# Patient Record
Sex: Male | Born: 1955 | Race: White | Hispanic: No | Marital: Married | Smoking: Current every day smoker
Health system: Southern US, Community
[De-identification: ages and names within clinical notes are randomized; demographics above are authoritative.]

## PROBLEM LIST (undated history)

## (undated) DIAGNOSIS — F32A Depression, unspecified: Secondary | ICD-10-CM

## (undated) DIAGNOSIS — F4310 Post-traumatic stress disorder, unspecified: Secondary | ICD-10-CM

## (undated) DIAGNOSIS — R0600 Dyspnea, unspecified: Secondary | ICD-10-CM

## (undated) DIAGNOSIS — F329 Major depressive disorder, single episode, unspecified: Secondary | ICD-10-CM

## (undated) DIAGNOSIS — D649 Anemia, unspecified: Secondary | ICD-10-CM

## (undated) DIAGNOSIS — F411 Generalized anxiety disorder: Secondary | ICD-10-CM

## (undated) DIAGNOSIS — C4490 Unspecified malignant neoplasm of skin, unspecified: Secondary | ICD-10-CM

## (undated) DIAGNOSIS — M1990 Unspecified osteoarthritis, unspecified site: Secondary | ICD-10-CM

## (undated) DIAGNOSIS — I10 Essential (primary) hypertension: Secondary | ICD-10-CM

## (undated) DIAGNOSIS — F319 Bipolar disorder, unspecified: Secondary | ICD-10-CM

## (undated) DIAGNOSIS — F988 Other specified behavioral and emotional disorders with onset usually occurring in childhood and adolescence: Secondary | ICD-10-CM

## (undated) DIAGNOSIS — Z87442 Personal history of urinary calculi: Secondary | ICD-10-CM

## (undated) HISTORY — PX: BACK SURGERY: SHX140

## (undated) HISTORY — PX: LITHOTRIPSY: SUR834

## (undated) HISTORY — DX: Other specified behavioral and emotional disorders with onset usually occurring in childhood and adolescence: F98.8

## (undated) HISTORY — PX: NOSE SURGERY: SHX723

## (undated) HISTORY — DX: Essential (primary) hypertension: I10

---

## 1999-03-26 ENCOUNTER — Emergency Department (HOSPITAL_COMMUNITY): Admission: EM | Admit: 1999-03-26 | Discharge: 1999-03-26 | Payer: Self-pay | Admitting: Emergency Medicine

## 1999-03-26 ENCOUNTER — Encounter: Payer: Self-pay | Admitting: Emergency Medicine

## 1999-05-10 ENCOUNTER — Emergency Department (HOSPITAL_COMMUNITY): Admission: EM | Admit: 1999-05-10 | Discharge: 1999-05-10 | Payer: Self-pay | Admitting: Emergency Medicine

## 1999-05-14 ENCOUNTER — Encounter: Payer: Self-pay | Admitting: Emergency Medicine

## 1999-05-14 ENCOUNTER — Emergency Department (HOSPITAL_COMMUNITY): Admission: EM | Admit: 1999-05-14 | Discharge: 1999-05-14 | Payer: Self-pay | Admitting: Emergency Medicine

## 2001-09-30 ENCOUNTER — Ambulatory Visit (HOSPITAL_BASED_OUTPATIENT_CLINIC_OR_DEPARTMENT_OTHER): Admission: RE | Admit: 2001-09-30 | Discharge: 2001-09-30 | Payer: Self-pay | Admitting: Urology

## 2002-01-19 ENCOUNTER — Ambulatory Visit (HOSPITAL_BASED_OUTPATIENT_CLINIC_OR_DEPARTMENT_OTHER): Admission: RE | Admit: 2002-01-19 | Discharge: 2002-01-19 | Payer: Self-pay | Admitting: Plastic Surgery

## 2002-01-19 ENCOUNTER — Encounter (INDEPENDENT_AMBULATORY_CARE_PROVIDER_SITE_OTHER): Payer: Self-pay | Admitting: Specialist

## 2002-11-18 ENCOUNTER — Ambulatory Visit (HOSPITAL_COMMUNITY): Admission: RE | Admit: 2002-11-18 | Discharge: 2002-11-18 | Payer: Self-pay | Admitting: Neurosurgery

## 2002-11-18 ENCOUNTER — Encounter: Payer: Self-pay | Admitting: Neurosurgery

## 2003-01-09 ENCOUNTER — Emergency Department (HOSPITAL_COMMUNITY): Admission: EM | Admit: 2003-01-09 | Discharge: 2003-01-09 | Payer: Self-pay | Admitting: Emergency Medicine

## 2007-06-16 ENCOUNTER — Encounter: Admission: RE | Admit: 2007-06-16 | Discharge: 2007-06-16 | Payer: Self-pay | Admitting: Orthopedic Surgery

## 2008-09-28 ENCOUNTER — Ambulatory Visit: Payer: Self-pay | Admitting: Diagnostic Radiology

## 2008-09-28 ENCOUNTER — Emergency Department (HOSPITAL_BASED_OUTPATIENT_CLINIC_OR_DEPARTMENT_OTHER): Admission: EM | Admit: 2008-09-28 | Discharge: 2008-09-28 | Payer: Self-pay | Admitting: Emergency Medicine

## 2008-10-19 ENCOUNTER — Emergency Department (HOSPITAL_BASED_OUTPATIENT_CLINIC_OR_DEPARTMENT_OTHER): Admission: EM | Admit: 2008-10-19 | Discharge: 2008-10-20 | Payer: Self-pay | Admitting: Emergency Medicine

## 2008-10-19 ENCOUNTER — Ambulatory Visit: Payer: Self-pay | Admitting: Interventional Radiology

## 2009-03-25 ENCOUNTER — Emergency Department (HOSPITAL_BASED_OUTPATIENT_CLINIC_OR_DEPARTMENT_OTHER): Admission: EM | Admit: 2009-03-25 | Discharge: 2009-03-25 | Payer: Self-pay | Admitting: Emergency Medicine

## 2009-04-07 ENCOUNTER — Ambulatory Visit: Payer: Self-pay | Admitting: Diagnostic Radiology

## 2009-04-07 ENCOUNTER — Emergency Department (HOSPITAL_BASED_OUTPATIENT_CLINIC_OR_DEPARTMENT_OTHER): Admission: EM | Admit: 2009-04-07 | Discharge: 2009-04-07 | Payer: Self-pay | Admitting: Emergency Medicine

## 2009-04-14 ENCOUNTER — Emergency Department (HOSPITAL_BASED_OUTPATIENT_CLINIC_OR_DEPARTMENT_OTHER): Admission: EM | Admit: 2009-04-14 | Discharge: 2009-04-14 | Payer: Self-pay | Admitting: Emergency Medicine

## 2009-04-19 ENCOUNTER — Encounter: Admission: RE | Admit: 2009-04-19 | Discharge: 2009-04-19 | Payer: Self-pay | Admitting: Orthopedic Surgery

## 2010-12-22 LAB — DIFFERENTIAL
Basophils Relative: 1 % (ref 0–1)
Eosinophils Absolute: 0.1 10*3/uL (ref 0.0–0.7)
Eosinophils Absolute: 0.2 10*3/uL (ref 0.0–0.7)
Eosinophils Relative: 1 % (ref 0–5)
Eosinophils Relative: 2 % (ref 0–5)
Lymphs Abs: 3.2 10*3/uL (ref 0.7–4.0)
Lymphs Abs: 4.1 10*3/uL — ABNORMAL HIGH (ref 0.7–4.0)
Monocytes Absolute: 0.9 10*3/uL (ref 0.1–1.0)
Monocytes Relative: 11 % (ref 3–12)
Neutrophils Relative %: 37 % — ABNORMAL LOW (ref 43–77)

## 2010-12-22 LAB — COMPREHENSIVE METABOLIC PANEL
AST: 57 U/L — ABNORMAL HIGH (ref 0–37)
Albumin: 4.8 g/dL (ref 3.5–5.2)
Alkaline Phosphatase: 86 U/L (ref 39–117)
BUN: 22 mg/dL (ref 6–23)
CO2: 26 mEq/L (ref 19–32)
Chloride: 104 mEq/L (ref 96–112)
GFR calc non Af Amer: >60 mL/min (ref >60)
Potassium: 5.1 mEq/L (ref 3.5–5.1)
Total Bilirubin: 1.2 mg/dL (ref 0.3–1.2)

## 2010-12-22 LAB — URINALYSIS, ROUTINE W REFLEX MICROSCOPIC
Bilirubin Urine: NEGATIVE
Bilirubin Urine: NEGATIVE
Glucose, UA: NEGATIVE mg/dL
Glucose, UA: NEGATIVE mg/dL
Hgb urine dipstick: NEGATIVE
Hgb urine dipstick: NEGATIVE
Hgb urine dipstick: NEGATIVE
Ketones, ur: NEGATIVE mg/dL
Ketones, ur: NEGATIVE mg/dL
Nitrite: NEGATIVE
Protein, ur: NEGATIVE mg/dL
Protein, ur: NEGATIVE mg/dL
Protein, ur: NEGATIVE mg/dL
Urobilinogen, UA: 0.2 mg/dL (ref 0.0–1.0)
Urobilinogen, UA: 1 mg/dL (ref 0.0–1.0)

## 2010-12-22 LAB — CBC
HCT: 38.3 % — ABNORMAL LOW (ref 39.0–52.0)
HCT: 38.4 % — ABNORMAL LOW (ref 39.0–52.0)
Hemoglobin: 13.1 g/dL (ref 13.0–17.0)
MCHC: 35.5 g/dL (ref 30.0–36.0)
MCV: 86.1 fL (ref 78.0–100.0)
MCV: 86.6 fL (ref 78.0–100.0)
Platelets: 55 10*3/uL — ABNORMAL LOW (ref 150–400)
RBC: 4.44 MIL/uL (ref 4.22–5.81)
RDW: 13 % (ref 11.5–15.5)
WBC: 7.8 10*3/uL (ref 4.0–10.5)

## 2010-12-22 LAB — URINE CULTURE

## 2010-12-22 LAB — BASIC METABOLIC PANEL
Chloride: 101 mEq/L (ref 96–112)
GFR calc Af Amer: >60 mL/min (ref >60)
Potassium: 4.2 mEq/L (ref 3.5–5.1)

## 2010-12-30 LAB — DIFFERENTIAL
Basophils Relative: 1 % (ref 0–1)
Eosinophils Absolute: 0.1 10*3/uL (ref 0.0–0.7)
Eosinophils Relative: 1 % (ref 0–5)
Monocytes Absolute: 0.9 10*3/uL (ref 0.1–1.0)
Monocytes Relative: 10 % (ref 3–12)
Neutrophils Relative %: 51 % (ref 43–77)

## 2010-12-30 LAB — BASIC METABOLIC PANEL
CO2: 27 mEq/L (ref 19–32)
Chloride: 103 mEq/L (ref 96–112)
Creatinine, Ser: 0.8 mg/dL (ref 0.4–1.5)
GFR calc Af Amer: >60 mL/min (ref >60)

## 2010-12-30 LAB — POCT CARDIAC MARKERS
CKMB, poc: <1 ng/mL — ABNORMAL LOW (ref 1.0–8.0)
Myoglobin, poc: 33.4 ng/mL (ref 12–200)
Troponin i, poc: <0.05 ng/mL (ref 0.00–0.09)

## 2010-12-30 LAB — CBC
Hemoglobin: 12.4 g/dL — ABNORMAL LOW (ref 13.0–17.0)
MCHC: 33.8 g/dL (ref 30.0–36.0)
MCV: 86.6 fL (ref 78.0–100.0)
RBC: 4.23 MIL/uL (ref 4.22–5.81)

## 2010-12-31 LAB — DIFFERENTIAL
Basophils Absolute: 0 10*3/uL (ref 0.0–0.1)
Basophils Relative: 1 % (ref 0–1)
Eosinophils Relative: 2 % (ref 0–5)
Lymphocytes Relative: 39 % (ref 12–46)
Monocytes Absolute: 0.7 10*3/uL (ref 0.1–1.0)
Monocytes Relative: 8 % (ref 3–12)

## 2010-12-31 LAB — COMPREHENSIVE METABOLIC PANEL
AST: 24 U/L (ref 0–37)
Albumin: 4.4 g/dL (ref 3.5–5.2)
Alkaline Phosphatase: 99 U/L (ref 39–117)
Chloride: 100 mEq/L (ref 96–112)
GFR calc Af Amer: >60 mL/min (ref >60)
Potassium: 4.7 mEq/L (ref 3.5–5.1)
Total Bilirubin: 0.4 mg/dL (ref 0.3–1.2)
Total Protein: 7.6 g/dL (ref 6.0–8.3)

## 2010-12-31 LAB — POCT CARDIAC MARKERS
CKMB, poc: <1 ng/mL — ABNORMAL LOW (ref 1.0–8.0)
Myoglobin, poc: 37.1 ng/mL (ref 12–200)
Troponin i, poc: <0.05 ng/mL (ref 0.00–0.09)

## 2010-12-31 LAB — URINALYSIS, ROUTINE W REFLEX MICROSCOPIC
Glucose, UA: NEGATIVE mg/dL
Hgb urine dipstick: NEGATIVE
Protein, ur: NEGATIVE mg/dL
Specific Gravity, Urine: 1.005 (ref 1.005–1.030)

## 2010-12-31 LAB — CBC
Platelets: 295 10*3/uL (ref 150–400)
WBC: 8.7 10*3/uL (ref 4.0–10.5)

## 2011-01-31 NOTE — H&P (Signed)
   NAME:  David [name], David [name]                          ACCOUNT NO.:  [id number]   MEDICAL RECORD NO.:  [id number]                   PATIENT TYPE:  OIB   LOCATION:  3013                                 FACILITY:  [facility]   PHYSICIAN:  Hilda Lias, M.D.                DATE OF BIRTH:  [birth year]   DATE OF ADMISSION:  11/18/2002  DATE OF DISCHARGE:  11/18/2002                                HISTORY & PHYSICAL   CLINICAL HISTORY:  David [name] is a gentleman who was seen by me 10 days ago in  my office because of back pain with radiation down to the hip and then  posterolateral to the left foot for almost four weeks.  The patient is  getting worse.  He has a burning sensation.  He cannot sleep, and he says  that he is getting weaker.  He had an MRI for further evaluation.  Because  of findings, he wanted to proceed with surgery.   PAST MEDICAL HISTORY:  Kidney stone in 2003.   SOCIAL HISTORY:  The patient smokes.  He does not drink.   FAMILY HISTORY:  Unremarkable.   REVIEW OF SYSTEMS:  Positive only for back pain and left leg pain.   MEDICATIONS:  Prilosec.   PHYSICAL EXAMINATION:  GENERAL:  The patient came to my office, limping from  the left leg.  He had difficulty sitting.  HEENT:  Normal.  NECK:  Normal.  LUNGS:  Clear.  HEART:  Heart sounds normal.  ABDOMEN:  Normal.  EXTREMITIES:  Normal pulses.  NEUROLOGIC:  Mental status normal.  Cranial nerves normal.  Strength:  He  has a 2/5 weakness, plantar flexion of the left foot, and 2/5 dorsiflexion.  Pressure with ice on the left ankle.   The MRI showed indeed he has a large herniated disk at L5-S1 with _________.  There is displacement on the thecal sac.   DIAGNOSIS:  Left L5-S1 herniated disk with a fragment.   RECOMMENDATIONS:  The patient is being admitted.  Will proceed with an L5-S1  diskectomy.  Decompression of the S1 nerve root, as well as the thecal sac.  He knows all the risks such as infection, CSF leak, worsening  pain,  paralysis, need for further surgery.                                               Hilda Lias, M.D.    EB/MEDQ  D:  11/18/2002  T:  11/18/2002  Job:  [id number]

## 2011-01-31 NOTE — Op Note (Signed)
   NAME:  David [name], David [name]                          ACCOUNT NO.:  [id number]   MEDICAL RECORD NO.:  [id number]                   PATIENT TYPE:  OIB   LOCATION:  3013                                 FACILITY:  [facility]   PHYSICIAN:  Hilda Lias, M.D.                DATE OF BIRTH:  [birth year]   DATE OF PROCEDURE:  11/18/2002  DATE OF DISCHARGE:  11/18/2002                                 OPERATIVE REPORT   PREOPERATIVE DIAGNOSIS:  Left L5-S1 herniated disk.   POSTOPERATIVE DIAGNOSIS:  Left L5-S1 herniated disk.   PROCEDURE:  Left L5-S1 removal of four fragments of disk, foraminotomy,  microscope, C-arm, [employer].   SURGEON:  Hilda Lias, M.D.   ASSISTANT:  Stefani Dama, M.D.   CLINICAL HISTORY:  The patient was admitted because of back and left leg  pain.  Because of the pain and the weakness, the patient wanted to proceed  with surgery.  The risks were explained in the history and physical.   DESCRIPTION OF PROCEDURE:  The patient was taken to the OR, and he was  positioned in a prone manner.  The back was prepped with Betadine.  With the  C-arm we localized the 5-1 space on the left side.  We made an inch incision  away from the midline and with dilator, we went straight down to the L5-S1  interspace.  We brought the microscope into the area.  With the drill we  drilled the lower lamina of L5, the upper of S1.  The yellow ligament was  removed.  Indeed, we found that the S1 nerve root was displaced laterally  and the thecal sac medially.  Right at the level of the axilla there was a  large disk.  Microdissection was carried out and four large pieces of disk  were removed.  We investigated above and below, and it was essentially  negative.  The patient has an osteophyte in the midline.  With the hooks, we  investigate superior, medial,  and inferior, and there was no more fragment.  The disk was completely closed, and we decided not to enter the disk space.  Foraminotomy was  accomplished.  Then the area was irrigated and fentanyl and  Depo-Medrol were left within the epidural space, and the wound was closed  with Vicryl and a Steri-Strip.                                               Hilda Lias, M.D.    EB/MEDQ  D:  11/18/2002  T:  11/19/2002  Job:  [id number]

## 2012-01-12 DIAGNOSIS — F329 Major depressive disorder, single episode, unspecified: Secondary | ICD-10-CM | POA: Insufficient documentation

## 2012-01-12 DIAGNOSIS — L02219 Cutaneous abscess of trunk, unspecified: Secondary | ICD-10-CM | POA: Insufficient documentation

## 2012-01-12 DIAGNOSIS — L03319 Cellulitis of trunk, unspecified: Secondary | ICD-10-CM | POA: Insufficient documentation

## 2012-01-12 DIAGNOSIS — Z79899 Other long term (current) drug therapy: Secondary | ICD-10-CM | POA: Insufficient documentation

## 2012-01-12 DIAGNOSIS — F17200 Nicotine dependence, unspecified, uncomplicated: Secondary | ICD-10-CM | POA: Insufficient documentation

## 2012-01-12 DIAGNOSIS — F3289 Other specified depressive episodes: Secondary | ICD-10-CM | POA: Insufficient documentation

## 2012-01-12 NOTE — ED Notes (Signed)
C/o red, raised area to right side of abdomen

## 2012-01-13 ENCOUNTER — Encounter (HOSPITAL_BASED_OUTPATIENT_CLINIC_OR_DEPARTMENT_OTHER): Payer: Self-pay | Admitting: *Deleted

## 2012-01-13 ENCOUNTER — Emergency Department (HOSPITAL_BASED_OUTPATIENT_CLINIC_OR_DEPARTMENT_OTHER)
Admission: EM | Admit: 2012-01-13 | Discharge: 2012-01-13 | Disposition: A | Discharge status: Home / Self Care | Payer: Medicare Other | Attending: Emergency Medicine | Admitting: Emergency Medicine

## 2012-01-13 DIAGNOSIS — L0291 Cutaneous abscess, unspecified: Secondary | ICD-10-CM

## 2012-01-13 HISTORY — DX: Major depressive disorder, single episode, unspecified: F32.9

## 2012-01-13 HISTORY — DX: Depression, unspecified: F32.A

## 2012-01-13 MED ORDER — OXYCODONE-ACETAMINOPHEN 5-325 MG PO TABS: 1.0000 | ORAL_TABLET | ORAL | Status: AC | PRN

## 2012-01-13 MED ORDER — SULFAMETHOXAZOLE-TRIMETHOPRIM 800-160 MG PO TABS: 1.0000 | ORAL_TABLET | Freq: Two times a day (BID) | ORAL | Status: AC

## 2012-01-13 MED ORDER — OXYCODONE-ACETAMINOPHEN 5-325 MG PO TABS
2.0000 | ORAL_TABLET | Freq: Once | ORAL | Status: AC
  Administered 2012-01-13: 2 via ORAL
  Filled 2012-01-13: qty 2

## 2012-01-13 NOTE — ED Provider Notes (Signed)
History   This chart was scribed for Joya Gaskins, MD by Melba Coon. The patient was seen in room MH06/MH06 and the patient's care was started at 12:00AM.    CSN: [id number]  Arrival date & time 01/12/12  2353   First MD Initiated Contact with Patient 01/12/12 2355      Chief Complaint  Patient presents with  . Abscess     HPI David [name] is a 56 y.o. male who presents to the Emergency Department complaining of a persistent, moderate abscess with an onset tonight. Area is indurated, red, and raised. Pain with palpation. No meds have been taken at home. Nothing alleviates or aggravates pain. No HA, fever, neck pain,chest pain, SOB, abd pain,vomiting/diarrhea, or extremity pain, edema, weakness, numbness, or tingling. No known allergies. No other pertinent medical symptoms.   Past Medical History  Diagnosis Date  . Depression     History reviewed. No pertinent past surgical history.  History reviewed. No pertinent family history.  History  Substance Use Topics  . Smoking status: Current Everyday Smoker  . Smokeless tobacco: Not on file  . Alcohol Use: Yes      Review of Systems 10 Systems reviewed and all are negative for acute change except as noted in the HPI.   Allergies  Review of patient's allergies indicates no known allergies.  Home Medications   Current Outpatient Rx  Name Route Sig Dispense Refill  . AMPHETAMINE-DEXTROAMPHETAMINE 10 MG PO TABS Oral Take 10 mg by mouth daily.    Marland Kitchen FLUOXETINE HCL 10 MG PO CAPS Oral Take 10 mg by mouth daily.    Marland Kitchen PREGABALIN 50 MG PO CAPS Oral Take 50 mg by mouth 3 (three) times daily.      BP 156/93  Pulse 89  Temp(Src) 97.9 F (36.6 C) (Oral)  Resp 18  SpO2 98%  Physical Exam CONSTITUTIONAL: Well developed/well nourished HEAD AND FACE: Normocephalic/atraumatic EYES: EOMI/PERRL ENMT: Mucous membranes moist NECK: supple no meningeal signs SPINE:entire spine nontender CV: S1/S2 noted, no  murmurs/rubs/gallops noted LUNGS: Lungs are clear to auscultation bilaterally, no apparent distress ABDOMEN: Soft, nontender, no rebound or guarding GU:no cva tenderness NEURO: Pt is awake/alert, moves all extremitiesx4 EXTREMITIES: pulses normal, full ROM SKIN: Nickel sized abscess to RLQ of abd; no drainage or bleeding, no crepitance, no fluctuance PSYCH: no abnormalities of mood noted  ED Course  Procedures   DIAGNOSTIC STUDIES: Oxygen Saturation is 98% on room air, normal by my interpretation.    COORDINATION OF CARE:  12:05AM - likely early abscess but not amenable to drainage at this time, discussed need for warm compresses to abscess, bactrim and return if worsens or becomes more fluctuant  The patient appears reasonably screened and/or stabilized for discharge and I doubt any other medical condition or other [facility] requiring further screening, evaluation, or treatment in the ED at this time prior to discharge.     MDM  Nursing notes reviewed and considered in documentation   I personally performed the services described in this documentation, which was scribed in my presence. The recorded information has been reviewed and considered.         Joya Gaskins, MD 01/13/12 [phone]

## 2012-01-13 NOTE — Discharge Instructions (Signed)
Abscess An abscess (boil or furuncle) is an infected area under your skin. This area is filled with yellowish white fluid (pus). HOME CARE   Only take medicine as told by your doctor.   Keep the skin clean around your abscess. Keep clothes that may touch the abscess clean.   Change any bandages (dressings) as told by your doctor.   Avoid direct skin contact with other people. The infection can spread by skin contact with others.   Practice good hygiene and do not share personal care items.   Do not share athletic equipment, towels, or whirlpools. Shower after every practice or work out session.   If a draining area cannot be covered:   Do not play sports.   Children should not go to daycare until the wound has healed or until fluid (drainage) stops coming out of the wound.   See your doctor for a follow-up visit as told.  GET HELP RIGHT AWAY IF:   There is more pain, puffiness (swelling), and redness in the wound site.   There is fluid or bleeding from the wound site.   You have muscle aches, chills, fever, or feel sick.   You or your child has a temperature by mouth above 102 F (38.9 C), not controlled by medicine.   Your baby is older than 3 months with a rectal temperature of 102 F (38.9 C) or higher.  MAKE SURE YOU:   Understand these instructions.   Will watch your condition.   Will get help right away if you are not doing well or get worse.  Document Released: 02/18/2008 Document Revised: 08/21/2011 Document Reviewed: 02/18/2008 [employer] Patient Information 2012 [employer]. 

## 2012-01-13 NOTE — ED Notes (Signed)
MD at bedside. 

## 2012-01-21 ENCOUNTER — Emergency Department
Admission: EM | Admit: 2012-01-21 | Discharge: 2012-01-21 | Disposition: A | Discharge status: Home / Self Care | Payer: Medicare Other | Source: Home / Self Care | Attending: Emergency Medicine | Admitting: Emergency Medicine

## 2012-01-21 ENCOUNTER — Encounter: Payer: Self-pay | Admitting: *Deleted

## 2012-01-21 DIAGNOSIS — L089 Local infection of the skin and subcutaneous tissue, unspecified: Secondary | ICD-10-CM

## 2012-01-21 DIAGNOSIS — L03319 Cellulitis of trunk, unspecified: Secondary | ICD-10-CM

## 2012-01-21 DIAGNOSIS — L02219 Cutaneous abscess of trunk, unspecified: Secondary | ICD-10-CM

## 2012-01-21 MED ORDER — TRAMADOL HCL 50 MG PO TABS: 50.0000 mg | ORAL_TABLET | Freq: Four times a day (QID) | ORAL | Status: AC | PRN

## 2012-01-21 MED ORDER — DOXYCYCLINE HYCLATE 100 MG PO CAPS: 100.0000 mg | ORAL_CAPSULE | Freq: Two times a day (BID) | ORAL | Status: AC

## 2012-01-21 MED ORDER — CEFTRIAXONE SODIUM 250 MG IJ SOLR
500.0000 mg | Freq: Once | INTRAMUSCULAR | Status: AC
  Administered 2012-01-21: 500 mg via INTRAMUSCULAR

## 2012-01-21 NOTE — ED Provider Notes (Signed)
History   This chart was scribed for David Gaskins, MD by David [name]. The patient was seen in room KUCP/KUCP and the patient's care was started at 12:00AM.    CSN: [id number]  Arrival date & time 01/21/12  1548   First MD Initiated Contact with Patient 01/12/12 2355      Chief Complaint  Patient presents with  . Wound Infection    right abdomen     HPI  LAST VISIT TO ER: David [name] is a 56 y.o. male who presents to the Emergency Department complaining of a persistent, moderate abscess with an onset tonight. Area is indurated, red, and raised. Pain with palpation. No meds have been taken at home. Nothing alleviates or aggravates pain. No HA, fever, neck pain,chest pain, SOB, abd pain,vomiting/diarrhea, or extremity pain, edema, weakness, numbness, or tingling. No known allergies. No other pertinent medical symptoms.  THIS VISIT: The patient states the wound has opened up and has been draining some purulent material.  He took all of the antibiotic.  He is here to check on her and make sure that it is getting better.  He feels that the redness and swelling and tenderness has decreased.  But he still concerned that he may have MRSA.  No fever, chills, nausea, vomiting.   Past Medical History  Diagnosis Date  . Depression     History reviewed. No pertinent past surgical history.  History reviewed. No pertinent family history.  History  Substance Use Topics  . Smoking status: Current Everyday Smoker  . Smokeless tobacco: Not on file  . Alcohol Use: Yes      Review of Systems  All other systems reviewed and are negative.   10 Systems reviewed and all are negative for acute change except as noted in the HPI.   Allergies  Codeine  Home Medications   Current Outpatient Rx  Name Route Sig Dispense Refill  . AMPHETAMINE-DEXTROAMPHETAMINE 10 MG PO TABS Oral Take 10 mg by mouth daily.    Marland Kitchen DOXYCYCLINE HYCLATE 100 MG PO CAPS Oral Take 1 capsule (100 mg total) by  mouth 2 (two) times daily. 20 capsule 0  . FLUOXETINE HCL 10 MG PO CAPS Oral Take 10 mg by mouth daily.    . OXYCODONE-ACETAMINOPHEN 5-325 MG PO TABS Oral Take 1 tablet by mouth every 4 (four) hours as needed for pain. 15 tablet 0  . PREGABALIN 50 MG PO CAPS Oral Take 50 mg by mouth 3 (three) times daily.    . SULFAMETHOXAZOLE-TRIMETHOPRIM 800-160 MG PO TABS Oral Take 1 tablet by mouth every 12 (twelve) hours. 10 tablet 0    BP 160/103  Pulse 114  Temp(Src) 97.8 F (36.6 C) (Oral)  Resp 14  Ht 5\' 8"  (1.727 m)  Wt 190 lb (86.183 kg)  BMI 28.89 kg/m2  SpO2 97%  Physical Exam  Nursing note and vitals reviewed. Constitutional: He is oriented to person, place, and time. He appears well-developed and well-nourished.  HENT:  Head: Normocephalic and atraumatic.  Eyes: No scleral icterus.  Neck: Neck supple.  Cardiovascular: Regular rhythm and normal heart sounds.   Pulmonary/Chest: Effort normal and breath sounds normal. No respiratory distress.  Abdominal:    Neurological: He is alert and oriented to person, place, and time.  Skin: Skin is warm and dry.       On the right side of his abdomen there is an abscess approximately 2 cm in diameter with induration but no fluctuance.  The central area  has some ulceration but does have some granulation tissue in the middle.  There is minimal tenderness to palpation.  I cannot express any drainage or blood.  There is no surrounding erythema or signs of cellulitis or panniculitis.  Psychiatric: He has a normal mood and affect. His speech is normal.     ED Course  Procedures         MDM   He has an abscess on his abdomen.  This is likely MRSA abscess.  Since it has opened up we took culture of it.  He has completed his course of Bactrim x5 days and I will extend that with doxycycline for 10 days.  We also gave him a shot of Rocephin here in clinic.   even though I do not see this when previously, I believe per his statements that it is  improving.  I do not feel that an I&D is necessary at this time. however if it is in the future, then a general surgeon may be appropriate because of the location.  I advised him to keep it clean and dry.  He is to establish with PCP in the area and followup.   Wound precautions are given to him.  Return to clinic if any worsening symptoms.  I advised him that it may take an entire for 6 weeks for good resolution to occur but it should not be getting worse.  David Hind, MD 01/21/12 [phone]

## 2012-01-21 NOTE — ED Notes (Signed)
Patient c/o abscess on right lower abdomen. He was seen 8 days ago @ [facility], given septra  which he finished. He reports the abscess was not lanced or cultured. He reports the wound is not worse but "different". Small amount of white pus present on dressing.

## 2012-01-24 LAB — WOUND CULTURE

## 2012-01-25 ENCOUNTER — Telehealth: Payer: Self-pay

## 2012-01-25 NOTE — ED Notes (Signed)
Left a message on voice mail asking how patient is feeling and advising to call back with any questions or concerns.  

## 2012-02-17 ENCOUNTER — Encounter: Payer: Self-pay | Admitting: *Deleted

## 2012-04-24 ENCOUNTER — Emergency Department
Admission: EM | Admit: 2012-04-24 | Discharge: 2012-04-24 | Disposition: A | Discharge status: Home / Self Care | Payer: Medicare Other | Source: Home / Self Care | Attending: Emergency Medicine | Admitting: Emergency Medicine

## 2012-04-24 DIAGNOSIS — L255 Unspecified contact dermatitis due to plants, except food: Secondary | ICD-10-CM

## 2012-04-24 MED ORDER — HYDROXYZINE HCL 25 MG PO TABS: 25.0000 mg | ORAL_TABLET | Freq: Three times a day (TID) | ORAL | Status: AC | PRN

## 2012-04-24 MED ORDER — METHYLPREDNISOLONE ACETATE 80 MG/ML IJ SUSP
80.0000 mg | Freq: Once | INTRAMUSCULAR | Status: AC
  Administered 2012-04-24: 80 mg via INTRAMUSCULAR

## 2012-04-24 MED ORDER — PREDNISONE (PAK) 10 MG PO TABS: ORAL_TABLET | ORAL | Status: AC

## 2012-04-24 NOTE — ED Provider Notes (Addendum)
History     CSN: [id number]  Arrival date & time 04/24/12  1513   First MD Initiated Contact with Patient 04/24/12 1517      Chief Complaint  Patient presents with  . Rash    HPI About 2 or 3 weeks ago, was exposed to plants in the [place] such as poison ivy or poison oak. For the past 2-1/2 weeks, complains of severely pruritic rash on extremities neck chest. It's now worsening in the itch keeps him up at night. Has tried OTC meds without relief. He denies any associated chest pain, dyspnea, fever, chills, nausea, vomiting. He denies any tick bite. Past Medical History  Diagnosis Date  . Depression   . ADD (attention deficit disorder)   . HTN (hypertension)     History reviewed. No pertinent past surgical history.  Family History  Problem Relation Age of Onset  . Arthritis    . Heart disease    . Cancer    . Hypertension      History  Substance Use Topics  . Smoking status: Current Everyday Smoker  . Smokeless tobacco: Not on file  . Alcohol Use: Yes      Review of Systems  All other systems reviewed and are negative.    Allergies  Codeine  Home Medications   Current Outpatient Rx  Name Route Sig Dispense Refill  . AMPHETAMINE-DEXTROAMPHETAMINE 10 MG PO TABS Oral Take 10 mg by mouth daily.    Marland Kitchen HYDROXYZINE HCL 25 MG PO TABS Oral Take 1 tablet (25 mg total) by mouth every 8 (eight) hours as needed for itching. May cause drowsiness. 20 tablet 0  . PREDNISONE (PAK) 10 MG PO TABS  Take as directed for 6 days. 21 tablet 0    BP 148/76  Pulse 86  Temp 98.1 F (36.7 C) (Oral)  Resp 18  Ht 5\' 8"  (1.727 m)  Wt 199 lb (90.266 kg)  BMI 30.26 kg/m2  SpO2 94%  Physical Exam  Nursing note and vitals reviewed. Constitutional: He is oriented to person, place, and time. He appears well-developed and well-nourished. No distress.  HENT:  Head: Normocephalic and atraumatic.  Eyes: Conjunctivae and EOM are normal. Pupils are equal, round, and reactive to light.  No scleral icterus.  Neck: Normal range of motion.  Cardiovascular: Normal rate.   Pulmonary/Chest: Effort normal.  Abdominal: He exhibits no distension.  Musculoskeletal: Normal range of motion.  Neurological: He is alert and oriented to person, place, and time.  Skin: Skin is warm.  Psychiatric: He has a normal mood and affect. His speech is normal. Cognition and memory are normal.       He is mildly anxious, but he is cooperative and pleasant.   Skin : Diffuse macular papular eruption, erythematous, in streaks in clusters bilaterally upper extremities and lower extremities and diffusely on trunk. Also, few patches on neck and face. Not affecting the eyes.  No lip swelling or wheezing.  ED Course  Procedures (including critical care time)  Labs Reviewed - No data to display No results found.   1. Dermatitis due to plants, including poison ivy, sumac, and oak       MDM  Severe contact dermatitis. After risks, benefits, alternatives discussed, he agrees with the following treatment: Depo-Medrol 80 mg IM stat. Prednisone 10 mg-6 day Dosepak. Hydroxyzine 25 mg by mouth when necessary itch. Precautions discussed. Other nonpharmacologic measures for skin care discussed. Recheck if not improved in one week, sooner if worse.  Also  advised him that systolic BP is mildly elevated today, and I advised him to followup with his PCP to have BP rechecked within 2 weeks. I also urged him to followup with his physician for all routine care. He notes that he was on Prozac empiric in the past, but he is no longer taking these, and I advised him to followup with his physician.  (He denies any depression, dysphoria, or any suicidal or homicidal ideation.)       Lajean Manes, MD 04/24/12 1715   Lajean Manes, MD 04/24/12 [phone]

## 2012-04-24 NOTE — ED Notes (Signed)
Rash to BL arms started on month ago now noted to left side of chest

## 2012-05-11 ENCOUNTER — Emergency Department
Admission: EM | Admit: 2012-05-11 | Discharge: 2012-05-11 | Disposition: A | Discharge status: Home / Self Care | Payer: Medicare Other | Source: Home / Self Care | Attending: Family Medicine | Admitting: Family Medicine

## 2012-05-11 ENCOUNTER — Encounter: Payer: Self-pay | Admitting: *Deleted

## 2012-05-11 DIAGNOSIS — L309 Dermatitis, unspecified: Secondary | ICD-10-CM

## 2012-05-11 DIAGNOSIS — L259 Unspecified contact dermatitis, unspecified cause: Secondary | ICD-10-CM

## 2012-05-11 MED ORDER — PREDNISONE 10 MG PO TABS: ORAL_TABLET | ORAL | Status: DC

## 2012-05-11 MED ORDER — SULFAMETHOXAZOLE-TRIMETHOPRIM 800-160 MG PO TABS: 1.0000 | ORAL_TABLET | Freq: Two times a day (BID) | ORAL | Status: AC

## 2012-05-11 MED ORDER — TRIAMCINOLONE ACETONIDE 40 MG/ML IJ SUSP
40.0000 mg | Freq: Once | INTRAMUSCULAR | Status: AC
  Administered 2012-05-11: 40 mg via INTRAMUSCULAR

## 2012-05-11 NOTE — ED Notes (Signed)
Patient was treat for poison ivy on 04/24/12 and given/finished prednisone. Rash has since spread to his back.

## 2012-05-11 NOTE — ED Provider Notes (Signed)
History     CSN: [id number]  Arrival date & time 05/11/12  1343   First MD Initiated Contact with Patient 05/11/12 1410      Chief Complaint  Patient presents with  . Rash      HPI Comments: Patient was seen approximately 17 days ago with a contact dermatitis on arms.  He states that he improved and rash almost completely resolved, but now he is developing recurrent itching and redness on arms.  He also has a new eruption on his back over the past several days.  There has been no drainage from lesions.  No fevers, chills, and sweats.  He feels well otherwise. Note that patient has had MRSA infection in past.  Patient is a 56 y.o. male presenting with rash. The history is provided by the patient.  Rash  This is a recurrent problem. The current episode started more than 2 days ago. The problem has been gradually worsening. The problem is associated with nothing. There has been no fever. The rash is present on the left arm, right arm and back. The patient is experiencing no pain. Associated symptoms include itching. Pertinent negatives include no blisters, no pain and no weeping. He has tried nothing for the symptoms.    Past Medical History  Diagnosis Date  . Depression   . ADD (attention deficit disorder)   . HTN (hypertension)     Past Surgical History  Procedure Date  . Back surgery     Family History  Problem Relation Age of Onset  . Arthritis    . Heart disease    . Cancer    . Hypertension      History  Substance Use Topics  . Smoking status: Current Everyday Smoker  . Smokeless tobacco: Not on file  . Alcohol Use: Yes      Review of Systems  Skin: Positive for itching and rash.  All other systems reviewed and are negative.    Allergies  Codeine  Home Medications   Current Outpatient Rx  Name Route Sig Dispense Refill  . FLUOXETINE HCL 10 MG PO CAPS Oral Take 10 mg by mouth daily.    Marland Kitchen PREGABALIN 100 MG PO CAPS Oral Take 100 mg by mouth 2 (two)  times daily.    Marland Kitchen ZOLPIDEM TARTRATE 5 MG PO TABS Oral Take 5 mg by mouth at bedtime as needed.    . AMPHETAMINE-DEXTROAMPHETAMINE 10 MG PO TABS Oral Take 10 mg by mouth daily.    Marland Kitchen PREDNISONE 10 MG PO TABS  Take 2 tabs by mouth twice daily for three days, then one tab twice daily for 2 days, then 1 tab daily for two days.  Take [facility] Start Wednesday 05/12/12. 18 tablet 0  . SULFAMETHOXAZOLE-TRIMETHOPRIM 800-160 MG PO TABS Oral Take 1 tablet by mouth 2 (two) times daily. 14 tablet 0    BP 126/87  Pulse 129  Temp 97.9 F (36.6 C) (Oral)  Resp 18  SpO2 97%  Physical Exam  Nursing note and vitals reviewed. Constitutional: He is oriented to person, place, and time. He appears well-developed and well-nourished. No distress.  HENT:  Nose: Nose normal.  Mouth/Throat: Oropharynx is clear and moist.  Eyes: Conjunctivae are normal. Pupils are equal, round, and reactive to light.  Neck: Neck supple.  Cardiovascular: Normal rate, regular rhythm and normal heart sounds.   Pulmonary/Chest: Effort normal and breath sounds normal.  Abdominal: There is no tenderness.  Lymphadenopathy:    He has no cervical  adenopathy.  Neurological: He is alert and oriented to person, place, and time.  Skin: Skin is warm and dry. Rash noted.          On both forearms, as noted on diagram, are patches of scaly erythema with increased peripheral erythema.  No drainage.  No swelling or fluctuance.  No tenderness  On the mid-back are multiple round and oval erythematous macules ranging from several mm dia to about 2 cm dia.    ED Course  Procedures  none      1. Dermatitis; ? Recurrent contact dermatitis.  ?early secondary MRSA cellulitis       MDM  Kenalog 40mg  IM.  Begin prednisone taper tomorrow.  Begin empiric Septra DS. Followup with dermatologist if not resolved within 10 days.        Lattie Haw, MD 05/11/12 [phone]

## 2012-09-03 ENCOUNTER — Emergency Department (INDEPENDENT_AMBULATORY_CARE_PROVIDER_SITE_OTHER): Payer: Medicare Other

## 2012-09-03 ENCOUNTER — Emergency Department
Admission: EM | Admit: 2012-09-03 | Discharge: 2012-09-03 | Disposition: A | Discharge status: Home / Self Care | Payer: Medicare Other | Source: Home / Self Care | Attending: Family Medicine | Admitting: Family Medicine

## 2012-09-03 ENCOUNTER — Encounter: Payer: Self-pay | Admitting: Emergency Medicine

## 2012-09-03 DIAGNOSIS — R5383 Other fatigue: Secondary | ICD-10-CM

## 2012-09-03 DIAGNOSIS — L259 Unspecified contact dermatitis, unspecified cause: Secondary | ICD-10-CM

## 2012-09-03 DIAGNOSIS — F17200 Nicotine dependence, unspecified, uncomplicated: Secondary | ICD-10-CM

## 2012-09-03 DIAGNOSIS — R5381 Other malaise: Secondary | ICD-10-CM

## 2012-09-03 DIAGNOSIS — R0989 Other specified symptoms and signs involving the circulatory and respiratory systems: Secondary | ICD-10-CM

## 2012-09-03 DIAGNOSIS — R05 Cough: Secondary | ICD-10-CM

## 2012-09-03 DIAGNOSIS — L309 Dermatitis, unspecified: Secondary | ICD-10-CM

## 2012-09-03 DIAGNOSIS — J209 Acute bronchitis, unspecified: Secondary | ICD-10-CM

## 2012-09-03 DIAGNOSIS — Z77090 Contact with and (suspected) exposure to asbestos: Secondary | ICD-10-CM

## 2012-09-03 LAB — POCT CBC W AUTO DIFF (K'VILLE URGENT CARE)

## 2012-09-03 MED ORDER — BENZONATATE 200 MG PO CAPS: 200.0000 mg | ORAL_CAPSULE | Freq: Every day | ORAL | Status: DC

## 2012-09-03 MED ORDER — CEFDINIR 300 MG PO CAPS: 300.0000 mg | ORAL_CAPSULE | Freq: Two times a day (BID) | ORAL | Status: DC

## 2012-09-03 MED ORDER — TRIAMCINOLONE ACETONIDE 0.1 % EX CREA: TOPICAL_CREAM | Freq: Two times a day (BID) | CUTANEOUS | Status: DC

## 2012-09-03 NOTE — ED Notes (Signed)
Nausea, malaise, headaches, rash on arms, red, itchy x 1 month

## 2012-09-03 NOTE — ED Provider Notes (Signed)
History     CSN: [id number]  Arrival date & time 09/03/12  1438   First MD Initiated Contact with Patient 09/03/12 1454      Chief Complaint  Patient presents with  . Nausea      HPI Comments: Patient complains of several week history of persistent sinus congestion, myalgias, fatigue, and cough.  He has had recurring chills.  He has occasional nausea but no vomiting, and decreased appetite.  He has recurring mouth sores.  He had flu-like symptoms about a month ago.  During this time he has developed a persistent pruritic rash on arms.  The history is provided by the patient.    Past Medical History  Diagnosis Date  . Depression   . ADD (attention deficit disorder)   . HTN (hypertension)     Past Surgical History  Procedure Date  . Back surgery     Family History  Problem Relation Age of Onset  . Arthritis    . Heart disease    . Cancer    . Hypertension      History  Substance Use Topics  . Smoking status: Current Every Day Smoker -- 1.0 packs/day for 25 years  . Smokeless tobacco: Not on file  . Alcohol Use: Yes      Review of Systems  Constitutional: Positive for chills, appetite change and fatigue. Negative for fever and unexpected weight change.  HENT:       Mouth sores  Eyes: Negative.   Respiratory: Positive for cough. Negative for chest tightness, shortness of breath and wheezing.   Gastrointestinal: Positive for nausea. Negative for abdominal pain.  Genitourinary: Negative.   Musculoskeletal: Positive for myalgias.  Skin: Positive for rash.  Neurological: Positive for headaches.    Allergies  Codeine  Home Medications   Current Outpatient Rx  Name  Route  Sig  Dispense  Refill  . CLONAZEPAM 0.5 MG PO TABS   Oral   Take 0.5 mg by mouth 2 (two) times daily as needed.         . AMPHETAMINE-DEXTROAMPHETAMINE 10 MG PO TABS   Oral   Take 10 mg by mouth daily.         Marland Kitchen BENZONATATE 200 MG PO CAPS   Oral   Take 1 capsule (200 mg  total) by mouth at bedtime. Take as needed for cough   12 capsule   0   . CEFDINIR 300 MG PO CAPS   Oral   Take 1 capsule (300 mg total) by mouth 2 (two) times daily.   20 capsule   0   . FLUOXETINE HCL 10 MG PO CAPS   Oral   Take 10 mg by mouth daily.         Marland Kitchen PREDNISONE 10 MG PO TABS      Take 2 tabs by mouth twice daily for three days, then one tab twice daily for 2 days, then 1 tab daily for two days.  Take [facility] Start Wednesday 05/12/12.   18 tablet   0   . PREGABALIN 100 MG PO CAPS   Oral   Take 100 mg by mouth 2 (two) times daily.         . TRIAMCINOLONE ACETONIDE 0.1 % EX CREA   Topical   Apply topically 2 (two) times daily.   45 g   1   . ZOLPIDEM TARTRATE 5 MG PO TABS   Oral   Take 5 mg by mouth at bedtime as needed.  BP 182/93  Pulse 107  Temp 97.7 F (36.5 C) (Oral)  Resp 18  Ht 5\' 8"  (1.727 m)  Wt 190 lb (86.183 kg)  BMI 28.89 kg/m2  SpO2 96%  Physical Exam  Skin: Rash noted. Rash is macular.          Lightly erythematous macular eczematous appearing eruption as noted on diagram   Nursing notes and Vital Signs reviewed. Appearance:  Patient appears stated age, and in no acute distress Eyes:  Pupils are equal, round, and reactive to light and accomodation.  Extraocular movement is intact.  Conjunctivae are not inflamed  Ears:  Canals normal.  Tympanic membranes normal.  Nose:   Normal.  No sinus tenderness.  Pharynx:  Normal Neck:  Supple.   No adenopathy or thyromegaly Lungs:  Clear to auscultation.  Breath sounds are equal.  Heart:  Regular rate and rhythm without murmurs, rubs, or gallops.  Abdomen:  Nontender without masses or hepatosplenomegaly.  Bowel sounds are present.  No CVA or flank tenderness.  Extremities:  No edema.  No calf tenderness   ED Course  Procedures  none   Labs Reviewed  POCT CBC W AUTO DIFF ([facility])   WBC 16.7; LY 23.2; MO 7.0; GR 69.8; Hgb 13.5; Platelets 433  COMPLETE METABOLIC  PANEL WITH GFR pending  TSH pending   Dg Chest 2 View  09/03/2012  *RADIOLOGY REPORT*  Clinical Data: Cough, congestion, smoker, asbestos exposure, hypertension  CHEST - 2 VIEW  Comparison: 10/19/2008  Findings: Normal heart size, mediastinal contours, and pulmonary vascularity. Minimal peribronchial thickening. Subsegmental atelectasis versus scarring lingula. No acute infiltrate, pleural effusion or pneumothorax. Old healed fracture posterior right sixth rib.  IMPRESSION: Minimal bronchitic changes and subsegmental atelectasis versus scarring at lingula.   Original Report Authenticated By: Ulyses Southward, M.D.      1. Fatigue   2. Acute bronchitis   3. Dermatitis; ?eczema vs contact dermatitis       MDM  TSH and CMP pending Begin Omnicef.  Prescription written for Benzonatate [facility]) to take at bedtime for night-time cough.  Rx for triamcinolone cream for dermatitis on arms Take plain Mucinex (guaifenesin) twice daily for cough and congestion.  Increase fluid intake, rest. May use Afrin nasal spray (or generic oxymetazoline) twice daily for about 5 days.  Also recommend using saline nasal spray several times daily and saline nasal irrigation (AYR is a common brand) Stop all antihistamines for now, and other non-prescription cough/cold preparations. Follow-up with family doctor if not improving 7 to 10 days.  Followup with dermatologist if rash not resolved 10 days.        Lattie Haw, MD 09/06/12 [phone]

## 2012-09-07 ENCOUNTER — Telehealth: Payer: Self-pay | Admitting: Emergency Medicine

## 2012-09-07 LAB — COMPLETE METABOLIC PANEL WITH GFR

## 2012-09-07 LAB — TSH

## 2013-02-16 ENCOUNTER — Ambulatory Visit (INDEPENDENT_AMBULATORY_CARE_PROVIDER_SITE_OTHER): Payer: Medicare Other | Admitting: Emergency Medicine

## 2013-02-16 VITALS — BP 158/82 | HR 89 | Temp 98.5°F | Resp 16 | Ht 68.0 in | Wt 174.0 lb

## 2013-02-16 DIAGNOSIS — L0291 Cutaneous abscess, unspecified: Secondary | ICD-10-CM

## 2013-02-16 DIAGNOSIS — L0390 Cellulitis, unspecified: Secondary | ICD-10-CM

## 2013-02-16 MED ORDER — SULFAMETHOXAZOLE-TRIMETHOPRIM 800-160 MG PO TABS: 1.0000 | ORAL_TABLET | Freq: Two times a day (BID) | ORAL | Status: DC

## 2013-02-16 NOTE — Progress Notes (Signed)
[facility] [address], [place] [address] [phone]  Date:  02/16/2013   Name:  David [name]   DOB:  1955/11/14   MRN:  [id number]  PCP:  Pcp Not In System    Chief Complaint: Recurrent Skin Infections   History of Present Illness:  David [name] is a 57 y.o. very pleasant male patient who presents with the following:  Painful mass on back of neck for two days. No fever or chills and no drainage.  Tender and red.  Mother tried to drain it for him with no improvement.  No improvement with over the counter medications or other home remedies. Denies other complaint or health concern today.   There are no active problems to display for this patient.   Past Medical History  Diagnosis Date  . Depression   . ADD (attention deficit disorder)   . HTN (hypertension)     Past Surgical History  Procedure Laterality Date  . Back surgery      History  Substance Use Topics  . Smoking status: Current Every Day Smoker -- 1.00 packs/day for 25 years  . Smokeless tobacco: Not on file  . Alcohol Use: Yes    Family History  Problem Relation Age of Onset  . Arthritis    . Heart disease    . Cancer    . Hypertension      Allergies  Allergen Reactions  . Codeine     Medication list has been reviewed and updated.  Current Outpatient Prescriptions on File Prior to Visit  Medication Sig Dispense Refill  . amphetamine-dextroamphetamine (ADDERALL) 10 MG tablet Take 10 mg by mouth daily.      Marland Kitchen FLUoxetine (PROZAC) 10 MG capsule Take 10 mg by mouth daily.      . pregabalin (LYRICA) 100 MG capsule Take 100 mg by mouth 2 (two) times daily.      Marland Kitchen zolpidem (AMBIEN) 5 MG tablet Take 5 mg by mouth at bedtime as needed.      . benzonatate (TESSALON) 200 MG capsule Take 1 capsule (200 mg total) by mouth at bedtime. Take as needed for cough  12 capsule  0  . cefdinir (OMNICEF) 300 MG capsule Take 1 capsule (300 mg total) by mouth 2 (two) times daily.  20 capsule   0  . clonazePAM (KLONOPIN) 0.5 MG tablet Take 0.5 mg by mouth 2 (two) times daily as needed.      . predniSONE (DELTASONE) 10 MG tablet Take 2 tabs by mouth twice daily for three days, then one tab twice daily for 2 days, then 1 tab daily for two days.  Take [facility] Start Wednesday 05/12/12.  18 tablet  0  . triamcinolone cream (KENALOG) 0.1 % Apply topically 2 (two) times daily.  45 g  1   No current facility-administered medications on file prior to visit.    Review of Systems:  As per HPI, otherwise negative.    Physical Examination: Filed Vitals:   02/16/13 2029  BP: 158/82  Pulse: 89  Temp: 98.5 F (36.9 C)  Resp: 16   Filed Vitals:   02/16/13 2029  Height: 5\' 8"  (1.727 m)  Weight: 174 lb (78.926 kg)   Body mass index is 26.46 kg/(m^2). Ideal Body Weight: Weight in (lb) to have BMI = 25: 164.1   GEN: WDWN, NAD, Non-toxic, Alert & Oriented x 3 HEENT: Atraumatic, Normocephalic.  Ears and Nose: No external deformity. EXTR: No clubbing/cyanosis/edema NEURO:  Normal gait.  PSYCH: Normally interactive. Conversant. Not depressed or anxious appearing.  Calm demeanor.  SKIN:  Abscess right posterior neck.  Not fluctuant  Assessment and Plan: Abscess Local heat Septra ds  Signed,  Phillips Odor, MD

## 2013-02-16 NOTE — Patient Instructions (Signed)
Abscess An abscess is an infected area that contains a collection of pus and debris.It can occur in almost any part of the body. An abscess is also known as a furuncle or boil. CAUSES  An abscess occurs when tissue gets infected. This can occur from blockage of oil or sweat glands, infection of hair follicles, or a minor injury to the skin. As the body tries to fight the infection, pus collects in the area and creates pressure under the skin. This pressure causes pain. People with weakened immune systems have difficulty fighting infections and get certain abscesses more often.  SYMPTOMS Usually an abscess develops on the skin and becomes a painful mass that is red, warm, and tender. If the abscess forms under the skin, you may feel a moveable soft area under the skin. Some abscesses break open (rupture) on their own, but most will continue to get worse without care. The infection can spread deeper into the body and eventually into the bloodstream, causing you to feel ill.  DIAGNOSIS  Your caregiver will take your medical history and perform a physical exam. A sample of fluid may also be taken from the abscess to determine what is causing your infection. TREATMENT  Your caregiver may prescribe antibiotic medicines to fight the infection. However, taking antibiotics alone usually does not cure an abscess. Your caregiver may need to make a small cut (incision) in the abscess to drain the pus. In some cases, gauze is packed into the abscess to reduce pain and to continue draining the area. HOME CARE INSTRUCTIONS   Only take over-the-counter or prescription medicines for pain, discomfort, or fever as directed by your caregiver.  If you were prescribed antibiotics, take them as directed. Finish them even if you start to feel better.  If gauze is used, follow your caregiver's directions for changing the gauze.  To avoid spreading the infection:  Keep your draining abscess covered with a  bandage.  Wash your hands well.  Do not share personal care items, towels, or whirlpools with others.  Avoid skin contact with others.  Keep your skin and clothes clean around the abscess.  Keep all follow-up appointments as directed by your caregiver. SEEK MEDICAL CARE IF:   You have increased pain, swelling, redness, fluid drainage, or bleeding.  You have muscle aches, chills, or a general ill feeling.  You have a fever. MAKE SURE YOU:   Understand these instructions.  Will watch your condition.  Will get help right away if you are not doing well or get worse. Document Released: 06/11/2005 Document Revised: 03/02/2012 Document Reviewed: 11/14/2011 Exit[employer] Patient Information 2014 [employer].  

## 2016-08-15 ENCOUNTER — Encounter (HOSPITAL_COMMUNITY): Payer: Self-pay | Admitting: Emergency Medicine

## 2016-08-15 ENCOUNTER — Ambulatory Visit (HOSPITAL_COMMUNITY)
Admission: EM | Admit: 2016-08-15 | Discharge: 2016-08-15 | Disposition: A | Discharge status: Home / Self Care | Payer: Self-pay | Attending: Family Medicine | Admitting: Family Medicine

## 2016-08-15 DIAGNOSIS — K0889 Other specified disorders of teeth and supporting structures: Secondary | ICD-10-CM

## 2016-08-15 DIAGNOSIS — K047 Periapical abscess without sinus: Secondary | ICD-10-CM

## 2016-08-15 DIAGNOSIS — R21 Rash and other nonspecific skin eruption: Secondary | ICD-10-CM

## 2016-08-15 MED ORDER — AMOXICILLIN-POT CLAVULANATE 875-125 MG PO TABS: 1.0000 | ORAL_TABLET | Freq: Two times a day (BID) | ORAL | 0 refills | Status: DC

## 2016-08-15 MED ORDER — PREDNISONE 5 MG (48) PO TBPK: 5.0000 mg | ORAL_TABLET | Freq: Every day | ORAL | 0 refills | Status: DC

## 2016-08-15 MED ORDER — PREDNISONE 5 MG (48) PO TBPK
5.0000 mg | ORAL_TABLET | Freq: Every day | ORAL | 0 refills | Status: DC
Start: 2016-08-15 — End: 2016-08-15

## 2016-08-15 MED ORDER — CHLORHEXIDINE GLUCONATE 0.12 % MT SOLN: 15.0000 mL | Freq: Two times a day (BID) | OROMUCOSAL | 0 refills | Status: DC

## 2016-08-15 NOTE — Discharge Instructions (Signed)
You have a probable tooth abscess which we will treat with antibiotic and tooth rinse. If worsens with fever or chills, or substantial swelling then go to the ED. Otherwise f/u with Dentist. The etiology of the rash is unclear we will treat with Prednisone. If not improving f/u with PCP. Hope you feel better.

## 2016-08-15 NOTE — ED Triage Notes (Signed)
Here for right lower dental pain onset 3 days... Reports he took antibiotics 3 months ago for similar sx  Also c/o rash on back and bilateral arms onset 2 days .... Reports staying in a new home  A&O x4... NAD

## 2016-08-15 NOTE — ED Provider Notes (Signed)
CSN: [id number]     Arrival date & time 08/15/16  1728 History   None    Chief Complaint  Patient presents with  . Dental Pain  . Rash   (Consider location/radiation/quality/duration/timing/severity/associated sxs/prior Treatment) 60 yo male presents with 2 complaints today.  First he reports right lower gum pain x 3 days. He carries a history of abscess in this area in the past. 3 months ago he was treated with abx and this pain improved. 3 days ago the pain returned. He reports no swelling is noted. No fever or chills.  Second patient carries a history of psoriasis. He reports an onset of right shoulder and neck discomfort and itching. He then developed a rash 2 days ago "over his usual rash". The rash is now on most of back. It is pruritic.  He denies exposure to new product or working in the woods. He did just move into a new home and carrying boxes.       Past Medical History:  Diagnosis Date  . ADD (attention deficit disorder)   . Depression   . HTN (hypertension)    Past Surgical History:  Procedure Laterality Date  . BACK SURGERY     Family History  Problem Relation Age of Onset  . Arthritis    . Heart disease    . Cancer    . Hypertension     Social History  Substance Use Topics  . Smoking status: Current Every Day Smoker    Packs/day: 1.00    Years: 25.00  . Smokeless tobacco: Never Used  . Alcohol use Yes    Review of Systems  Constitutional: Negative for fatigue and fever.  Musculoskeletal: Negative for arthralgias.  Skin: Positive for rash.  Allergic/Immunologic: Negative for environmental allergies.    Allergies  Codeine  Home Medications   Prior to Admission medications   Medication Sig Start Date End Date Taking? Authorizing Provider  amphetamine-dextroamphetamine (ADDERALL) 10 MG tablet Take 10 mg by mouth daily.   Yes Historical Provider, MD  citalopram (CELEXA) 20 MG tablet Take 20 mg by mouth daily.   Yes Historical Provider, MD   cloNIDine (CATAPRES) 0.1 MG tablet Take 0.1 mg by mouth 2 (two) times daily.   Yes Historical Provider, MD  hydrOXYzine (ATARAX/VISTARIL) 10 MG tablet Take 10 mg by mouth 3 (three) times daily as needed.   Yes Historical Provider, MD  triamcinolone cream (KENALOG) 0.1 % Apply topically 2 (two) times daily. 09/03/12  Yes Kandra Nicolas, MD  zolpidem (AMBIEN) 5 MG tablet Take 5 mg by mouth at bedtime as needed.   Yes Historical Provider, MD  amoxicillin-clavulanate (AUGMENTIN) 875-125 MG tablet Take 1 tablet by mouth 2 (two) times daily. 08/15/16   Bjorn Pippin, PA-C  cefdinir (OMNICEF) 300 MG capsule Take 1 capsule (300 mg total) by mouth 2 (two) times daily. 09/03/12   Kandra Nicolas, MD  chlorhexidine (PERIDEX) 0.12 % solution Use as directed 15 mLs in the mouth or throat 2 (two) times daily. 08/15/16   Bjorn Pippin, PA-C  clonazePAM (KLONOPIN) 0.5 MG tablet Take 0.5 mg by mouth 2 (two) times daily as needed.    Historical Provider, MD  FLUoxetine (PROZAC) 10 MG capsule Take 10 mg by mouth daily.    Historical Provider, MD  predniSONE (STERAPRED UNI-PAK 48 TAB) 5 MG (48) TBPK tablet Take 1 tablet (5 mg total) by mouth daily. 08/15/16   Bjorn Pippin, PA-C  pregabalin (LYRICA) 100 MG capsule Take  100 mg by mouth 2 (two) times daily.    Historical Provider, MD  sulfamethoxazole-trimethoprim (BACTRIM DS,SEPTRA DS) 800-160 MG per tablet Take 1 tablet by mouth 2 (two) times daily. 02/16/13   Roselee Culver, MD   Meds Ordered and Administered this Visit  Medications - No data to display  BP 137/79 (BP Location: Right Arm)   Pulse 111   Temp 98.3 F (36.8 C) (Oral)   Resp 22   SpO2 98%  No data found.   Physical Exam  Constitutional: He is oriented to person, place, and time. He appears well-developed and well-nourished. No distress.  HENT:  Mouth/Throat: Oropharynx is clear and moist. No oropharyngeal exudate.  Right lower gum with erythema and mild swelling, no frank abscess is  noted, pain with palpation of tongue blade, and poor dentes throughout lower gum  Neck: Normal range of motion.  Lymphadenopathy:    He has no cervical adenopathy.  Neurological: He is alert and oriented to person, place, and time. No cranial nerve deficit.  Skin: Skin is warm and dry. Rash noted. He is not diaphoretic.  Macular papular rash to right shoulder, neck, upper and lower back. No vesicles and rash crossed over midline. Few scabs and eschar are noted. Plaques to lower buttock and bilateral elbows  Psychiatric: His behavior is normal.  Nursing note and vitals reviewed.   Urgent Care Course   Clinical Course     Procedures (including critical care time)  Labs Review Labs Reviewed - No data to display  Imaging Review No results found.   Visual Acuity Review  Right Eye Distance:   Left Eye Distance:   Bilateral Distance:    Right Eye Near:   Left Eye Near:    Bilateral Near:         MDM   1. Pain, dental   2. Rash   3. Dental abscess    1. Probable dental carries, without frank abscess. Treat with Peridex and Augmentin. F/U with Dentist routine or if worsens in the ED.  2. He has psoriasis, but also a dermatitis type rash. There are no vesicles to suggest shingles. No urticaria is noted. Treat with prednisone pack. May use benadryl for itching if needed at 25mg  every 4 hours. If continues f/u with PCP to further evaluate.     Bjorn Pippin, PA-C 08/15/16 [phone]

## 2017-09-22 DIAGNOSIS — L821 Other seborrheic keratosis: Secondary | ICD-10-CM | POA: Diagnosis not present

## 2017-09-22 DIAGNOSIS — C44519 Basal cell carcinoma of skin of other part of trunk: Secondary | ICD-10-CM | POA: Diagnosis not present

## 2017-09-22 DIAGNOSIS — D485 Neoplasm of uncertain behavior of skin: Secondary | ICD-10-CM | POA: Diagnosis not present

## 2017-09-22 DIAGNOSIS — L570 Actinic keratosis: Secondary | ICD-10-CM | POA: Diagnosis not present

## 2017-09-22 DIAGNOSIS — L400 Psoriasis vulgaris: Secondary | ICD-10-CM | POA: Diagnosis not present

## 2017-10-05 DIAGNOSIS — F319 Bipolar disorder, unspecified: Secondary | ICD-10-CM | POA: Diagnosis not present

## 2017-10-20 DIAGNOSIS — Z85828 Personal history of other malignant neoplasm of skin: Secondary | ICD-10-CM | POA: Diagnosis not present

## 2017-10-20 DIAGNOSIS — L570 Actinic keratosis: Secondary | ICD-10-CM | POA: Diagnosis not present

## 2017-10-20 DIAGNOSIS — D485 Neoplasm of uncertain behavior of skin: Secondary | ICD-10-CM | POA: Diagnosis not present

## 2017-10-20 DIAGNOSIS — L821 Other seborrheic keratosis: Secondary | ICD-10-CM | POA: Diagnosis not present

## 2017-10-20 DIAGNOSIS — L820 Inflamed seborrheic keratosis: Secondary | ICD-10-CM | POA: Diagnosis not present

## 2017-10-20 DIAGNOSIS — L400 Psoriasis vulgaris: Secondary | ICD-10-CM | POA: Diagnosis not present

## 2017-12-29 DIAGNOSIS — F319 Bipolar disorder, unspecified: Secondary | ICD-10-CM | POA: Diagnosis not present

## 2018-02-18 ENCOUNTER — Emergency Department (HOSPITAL_BASED_OUTPATIENT_CLINIC_OR_DEPARTMENT_OTHER)
Admission: EM | Admit: 2018-02-18 | Discharge: 2018-02-18 | Disposition: A | Discharge status: Home / Self Care | Payer: PPO | Attending: Emergency Medicine | Admitting: Emergency Medicine

## 2018-02-18 ENCOUNTER — Encounter (HOSPITAL_BASED_OUTPATIENT_CLINIC_OR_DEPARTMENT_OTHER): Payer: Self-pay

## 2018-02-18 ENCOUNTER — Other Ambulatory Visit: Payer: Self-pay

## 2018-02-18 DIAGNOSIS — Z79899 Other long term (current) drug therapy: Secondary | ICD-10-CM | POA: Insufficient documentation

## 2018-02-18 DIAGNOSIS — I10 Essential (primary) hypertension: Secondary | ICD-10-CM | POA: Insufficient documentation

## 2018-02-18 DIAGNOSIS — Y93B9 Activity, other involving muscle strengthening exercises: Secondary | ICD-10-CM | POA: Insufficient documentation

## 2018-02-18 DIAGNOSIS — S79922A Unspecified injury of left thigh, initial encounter: Secondary | ICD-10-CM | POA: Diagnosis present

## 2018-02-18 DIAGNOSIS — Y999 Unspecified external cause status: Secondary | ICD-10-CM | POA: Diagnosis not present

## 2018-02-18 DIAGNOSIS — F17210 Nicotine dependence, cigarettes, uncomplicated: Secondary | ICD-10-CM | POA: Insufficient documentation

## 2018-02-18 DIAGNOSIS — S76212A Strain of adductor muscle, fascia and tendon of left thigh, initial encounter: Secondary | ICD-10-CM | POA: Diagnosis not present

## 2018-02-18 DIAGNOSIS — Y929 Unspecified place or not applicable: Secondary | ICD-10-CM | POA: Insufficient documentation

## 2018-02-18 DIAGNOSIS — X500XXA Overexertion from strenuous movement or load, initial encounter: Secondary | ICD-10-CM | POA: Diagnosis not present

## 2018-02-18 MED ORDER — CYCLOBENZAPRINE HCL 10 MG PO TABS: 10.0000 mg | ORAL_TABLET | Freq: Two times a day (BID) | ORAL | 0 refills | Status: DC | PRN

## 2018-02-18 MED ORDER — NAPROXEN 250 MG PO TABS: 500.0000 mg | ORAL_TABLET | Freq: Two times a day (BID) | ORAL | 0 refills | Status: AC

## 2018-02-18 MED ORDER — KETOROLAC TROMETHAMINE 30 MG/ML IJ SOLN
30.0000 mg | Freq: Once | INTRAMUSCULAR | Status: AC
  Administered 2018-02-18: 30 mg via INTRAMUSCULAR
  Filled 2018-02-18: qty 1

## 2018-02-18 MED ORDER — HYDROCODONE-ACETAMINOPHEN 5-325 MG PO TABS
1.0000 | ORAL_TABLET | Freq: Once | ORAL | Status: AC
  Administered 2018-02-18: 1 via ORAL
  Filled 2018-02-18: qty 1

## 2018-02-18 NOTE — Discharge Instructions (Addendum)
use extreme caution when aching Flexeril, a muscle relaxant, with your other medications as it can cause sedation.  Take naproxen on a schedule for 4 days, take the medication with food.  You may take Tylenol in addition for breakthrough pain.  Contact Dr. Barbaraann Barthel for a follow-up appointment.

## 2018-02-18 NOTE — ED Notes (Signed)
ED Provider at bedside. 

## 2018-02-18 NOTE — ED Triage Notes (Signed)
Pt states he injured left LE and testicle weeks 6 weeks ago during a workout-pt NAD-presents to triage in w/c

## 2018-02-19 NOTE — ED Provider Notes (Signed)
[facility] Provider Note   CSN: [id number] Arrival date & time: 02/18/18  2058     History   Chief Complaint No chief complaint on file.   HPI David [name] is a 62 y.o. male.  62 year old male with past medical history including hypertension, ADD, depression who presents with left thigh pain.  6 weeks ago, he was working out at [employer], stretching his legs and abducting legs in a "V" position when he had sudden onset of sharp pain on medial L thigh radiating to groin.  He assumed that he had pulled a muscle and tried to "wait it out" to see if it would get better on its own.  Taking over-the-counter medications without relief.  It initially seem like it was getting better but then got worse again.  Recently it has been so severe that he could not stand it anymore and decided to be evaluated.  Pain is worse when he walks around.  The pain radiates up to his left testicle but he denies any actual testicular pain or swelling.  No obvious leg swelling.  No fevers or recent illness.  The history is provided by the patient.    Past Medical History:  Diagnosis Date  . ADD (attention deficit disorder)   . Depression   . HTN (hypertension)     There are no active problems to display for this patient.   Past Surgical History:  Procedure Laterality Date  . BACK SURGERY          Home Medications    Prior to Admission medications   Medication Sig Start Date End Date Taking? Authorizing Provider  amphetamine-dextroamphetamine (ADDERALL) 10 MG tablet Take 10 mg by mouth daily.    [provider]  citalopram (CELEXA) 20 MG tablet Take 20 mg by mouth daily.    [provider]  clonazePAM (KLONOPIN) 0.5 MG tablet Take 0.5 mg by mouth 2 (two) times daily as needed.    [provider]  cloNIDine (CATAPRES) 0.1 MG tablet Take 0.1 mg by mouth 2 (two) times daily.    [provider]  cyclobenzaprine (FLEXERIL) 10 MG tablet  Take 1 tablet (10 mg total) by mouth 2 (two) times daily as needed for muscle spasms. 02/18/18   Little, Wenda Overland, MD  FLUoxetine (PROZAC) 10 MG capsule Take 10 mg by mouth daily.    [provider]  hydrOXYzine (ATARAX/VISTARIL) 10 MG tablet Take 10 mg by mouth 3 (three) times daily as needed.    [provider]  naproxen (NAPROSYN) 250 MG tablet Take 2 tablets (500 mg total) by mouth 2 (two) times daily with a meal for 4 days. 02/18/18 02/22/18  Little, Wenda Overland, MD  pregabalin (LYRICA) 100 MG capsule Take 100 mg by mouth 2 (two) times daily.    [provider]  triamcinolone cream (KENALOG) 0.1 % Apply topically 2 (two) times daily. 09/03/12   Kandra Nicolas, MD  zolpidem (AMBIEN) 5 MG tablet Take 5 mg by mouth at bedtime as needed.    [provider]    Family History Family History  Problem Relation Age of Onset  . Arthritis Unknown   . Heart disease Unknown   . Cancer Unknown   . Hypertension Unknown     Social History Social History   Tobacco Use  . Smoking status: Current Every Day Smoker    Packs/day: 1.00    Years: 25.00    Pack years: 25.00  .  Smokeless tobacco: Never Used  Substance Use Topics  . Alcohol use: Yes    Comment: daily  . Drug use: Yes    Types: Marijuana     Allergies   Codeine   Review of Systems Review of Systems All other systems reviewed and are negative except that which was mentioned in HPI   Physical Exam Updated Vital Signs BP 132/70 (BP Location: Right Arm)   Pulse 67   Temp 98.4 F (36.9 C) (Oral)   Resp 18   Ht 5\' 8"  (1.727 m)   Wt 77.1 kg (170 lb)   SpO2 100%   BMI 25.85 kg/m   Physical Exam  Constitutional: He is oriented to person, place, and time. He appears well-developed and well-nourished. No distress.  HENT:  Head: Normocephalic and atraumatic.  Moist mucous membranes  Eyes: Conjunctivae are normal.  Neck: Neck supple.  Genitourinary:  Genitourinary Comments: No  testicular tenderness or edema  Musculoskeletal: Normal range of motion. He exhibits no edema.  Mild tenderness along proximal medial left thigh along adductor muscles, no deformity  Neurological: He is alert and oriented to person, place, and time.  Fluent speech  Skin: Skin is warm and dry. No rash noted.  Psychiatric: He has a normal mood and affect. Judgment normal.  Nursing note and vitals reviewed.  Chaperone was present during exam.   ED Treatments / Results  Labs (all labs ordered are listed, but only abnormal results are displayed) Labs Reviewed - No data to display  EKG None  Radiology No results found.  Procedures Procedures (including critical care time)  Medications Ordered in ED Medications  HYDROcodone-acetaminophen (NORCO/VICODIN) 5-325 MG per tablet 1 tablet (1 tablet Oral Given 02/18/18 2159)  ketorolac (TORADOL) 30 MG/ML injection 30 mg (30 mg Intramuscular Given 02/18/18 2200)     Initial Impression / Assessment and Plan / ED Course  I have reviewed the triage vital signs and the nursing notes.  Pertinent labs & imaging results that were available during my care of the patient were reviewed by me and considered in my medical decision making (see chart for details).   Suspect adductor muscle injury based on description and exam.  No findings to suggest testicular problem or hernia.  No risk factors for blood clot.  No skin changes, warmth, or swelling to suggest infection.  Discussed supportive measures and provided with sports medicine follow-up.  Final Clinical Impressions(s) / ED Diagnoses   Final diagnoses:  Strain of adductor muscle, fascia and tendon of left thigh, initial encounter    ED Discharge Orders        Ordered    naproxen (NAPROSYN) 250 MG tablet  2 times daily with meals     02/18/18 2153    cyclobenzaprine (FLEXERIL) 10 MG tablet  2 times daily PRN     02/18/18 2153       Little, Wenda Overland, MD 02/19/18 [phone]

## 2018-03-05 ENCOUNTER — Ambulatory Visit: Payer: PPO | Admitting: Family Medicine

## 2018-03-05 VITALS — BP 133/81 | Ht 68.0 in | Wt 170.0 lb

## 2018-03-05 DIAGNOSIS — M25551 Pain in right hip: Secondary | ICD-10-CM

## 2018-03-05 DIAGNOSIS — S76201A Unspecified injury of adductor muscle, fascia and tendon of right thigh, initial encounter: Secondary | ICD-10-CM

## 2018-03-05 MED ORDER — MELOXICAM 15 MG PO TABS: 15.0000 mg | ORAL_TABLET | Freq: Every day | ORAL | 0 refills | Status: DC

## 2018-03-05 NOTE — Patient Instructions (Addendum)
You have an Rusk Below  Is some information: How does it occur? Most commonly, strains occur during acute muscle contraction, such as when kicking, pivoting or skating. Factors that can predispose a patient to injury include failure to warm up, properly stretch, or fatigue from overuse. Risk increases with: Sports involving acceleration such as sprinting, soccer, football, hockey. Sports with repeated movements such as soccer, martial arts, and gymnastics. Failure to warm up, stretch or be properly conditioned. What are the symptoms? Sudden onset of pain, sometimes accompanied by the sensation of a pop in the inner thigh. Inability to continue activity after initial onset of pain How is it diagnosed? History and physical exam are usually sufficient to establish the diagnosis Physical findings include tenderness to palpation (touch), bruising over the inner thigh and sometimes, swelling and warmth over the site of injury. With severe tears there may be a palpable defect over the site of the injury, though this is uncommon. Range of motion testing of the hip is usually normal, but pain is usually reproduced when the patient is asked to contract the muscles. In this case, asking the patient to bring their leg towards midline (adducting their leg) reproduces pain and is usually accompanied by weakness. Are any special tests used to diagnose an adductor muscle strain? Special tests are typically unnecessary.  X-rays are almost always negative, but are appropriate in cases in which there is tenderness at the site of bony insertion, or in skeletally immature athletes/patients. In children, attachment sites of muscle/tendon units are vulnerable to fracture and are weaker than the muscle/tendons.  We are placing you on some home exercises.  I am also giving you a prescription strength anti-inflammatory medication to be taken once a day. Take it with food. Do NOT take any OTC ibuprofen,  advil, aleve or naprosyn with this. You CAN take tylenol if you need to.  I would cotitnue heat and or ice three times a day for the next two weeks. Wearing some compression shorts may also be helpful.  This is going to take 6-8 weeks to totally heal. You will need to baby the area so no stretches or exercises other than those that I have given you until I see you back in 2-3 weeks. Great to meet you!

## 2018-03-08 ENCOUNTER — Other Ambulatory Visit: Payer: Self-pay | Admitting: Family Medicine

## 2018-03-08 ENCOUNTER — Ambulatory Visit
Admission: RE | Admit: 2018-03-08 | Discharge: 2018-03-08 | Disposition: A | Discharge status: Home / Self Care | Payer: PPO | Source: Ambulatory Visit | Attending: Family Medicine | Admitting: Family Medicine

## 2018-03-08 DIAGNOSIS — S76201A Unspecified injury of adductor muscle, fascia and tendon of right thigh, initial encounter: Secondary | ICD-10-CM

## 2018-03-08 DIAGNOSIS — M25551 Pain in right hip: Secondary | ICD-10-CM

## 2018-03-08 DIAGNOSIS — M25552 Pain in left hip: Secondary | ICD-10-CM | POA: Diagnosis not present

## 2018-03-08 NOTE — Progress Notes (Signed)
    CHIEF COMPLAINT / HPI: Left groin pain.  About 6 weeks ago he was doing his daily exercises at home.  Is doing some hip abduction and felt a pop in his left groin area.  The next day it was quite sore.  He has not gotten any better and in fact is been unable to exercise anymore since then.  He is also having trouble walking, started using a cane.  Difficulty getting comfortable at night.  He has trouble getting up out of a chair.  Pain is keeping him awake at night.  REVIEW OF SYSTEMS: No unusual weight change, no lower extremity numbness or tingling on the left.  Has noted no skin rash in the area of the left groin.  PERTINENT  PMH / PSH: I have reviewed the patient's medications, allergies, past medical and surgical history, smoking status and updated in the EMR as appropriate.   OBJECTIVE: General: Well-developed no distress hips: Bilaterally he has full range of motion in internal and external rotation.  He has normal strength in hip function 5 out of 5 symmetrical.  Resisted hip abduction or abduction on the left re-creates his pain in the left groin area.  Palpation of the left groin reveals no well or mass.  I feel no unusual lymphadenopathy.  Skin: There is no unusual rash, no unusual warmth or erythema noted in the left groin or buttock area.  ASSESSMENT / PLAN: Abductor muscle strain.  We discussed at length.  We will put him on some exercises and see him back.  I did inform to 8 weeks to rehabilitate appropriately.  Exercise handout was given sizes were planes by my nurse.  Return to clinic 3 to 4 weeks, sooner with problems.  I would recommend continuing ice/heat as tolerated.  Avoid activities that increase pain

## 2018-03-12 ENCOUNTER — Encounter: Payer: Self-pay | Admitting: Family Medicine

## 2018-03-15 ENCOUNTER — Emergency Department (HOSPITAL_COMMUNITY)
Admission: EM | Admit: 2018-03-15 | Discharge: 2018-03-16 | Disposition: A | Discharge status: Home / Self Care | Payer: PPO | Attending: Emergency Medicine | Admitting: Emergency Medicine

## 2018-03-15 ENCOUNTER — Encounter (HOSPITAL_COMMUNITY): Payer: Self-pay | Admitting: Emergency Medicine

## 2018-03-15 DIAGNOSIS — Y900 Blood alcohol level of less than 20 mg/100 ml: Secondary | ICD-10-CM | POA: Diagnosis not present

## 2018-03-15 DIAGNOSIS — I10 Essential (primary) hypertension: Secondary | ICD-10-CM | POA: Insufficient documentation

## 2018-03-15 DIAGNOSIS — Z79899 Other long term (current) drug therapy: Secondary | ICD-10-CM | POA: Diagnosis not present

## 2018-03-15 DIAGNOSIS — F1014 Alcohol abuse with alcohol-induced mood disorder: Secondary | ICD-10-CM | POA: Diagnosis present

## 2018-03-15 DIAGNOSIS — D649 Anemia, unspecified: Secondary | ICD-10-CM | POA: Insufficient documentation

## 2018-03-15 DIAGNOSIS — F17200 Nicotine dependence, unspecified, uncomplicated: Secondary | ICD-10-CM | POA: Diagnosis not present

## 2018-03-15 LAB — CBC
HCT: 29 % — ABNORMAL LOW (ref 39.0–52.0)
Hemoglobin: 9.3 g/dL — ABNORMAL LOW (ref 13.0–17.0)
MCH: 25.3 pg — ABNORMAL LOW (ref 26.0–34.0)
MCHC: 32.1 g/dL (ref 30.0–36.0)
MCV: 78.8 fL (ref 78.0–100.0)
Platelets: 448 10*3/uL — ABNORMAL HIGH (ref 150–400)
RBC: 3.68 MIL/uL — AB (ref 4.22–5.81)
RDW: 17.6 % — ABNORMAL HIGH (ref 11.5–15.5)
WBC: 9.5 10*3/uL (ref 4.0–10.5)

## 2018-03-15 LAB — COMPREHENSIVE METABOLIC PANEL
ALT: 27 U/L (ref 0–44)
ANION GAP: 9 (ref 5–15)
AST: 35 U/L (ref 15–41)
Albumin: 4 g/dL (ref 3.5–5.0)
Alkaline Phosphatase: 114 U/L (ref 38–126)
BUN: 14 mg/dL (ref 8–23)
CO2: 20 mmol/L — AB (ref 22–32)
Calcium: 9 mg/dL (ref 8.9–10.3)
Chloride: 111 mmol/L (ref 98–111)
Creatinine, Ser: 1.24 mg/dL (ref 0.61–1.24)
GFR calc non Af Amer: >60 mL/min (ref >60)
Glucose, Bld: 101 mg/dL — ABNORMAL HIGH (ref 70–99)
Potassium: 4 mmol/L (ref 3.5–5.1)
SODIUM: 140 mmol/L (ref 135–145)
TOTAL PROTEIN: 7.8 g/dL (ref 6.5–8.1)
Total Bilirubin: 0.7 mg/dL (ref 0.3–1.2)

## 2018-03-15 LAB — RAPID URINE DRUG SCREEN, HOSP PERFORMED
AMPHETAMINES: POSITIVE — AB
BENZODIAZEPINES: POSITIVE — AB
Cocaine: POSITIVE — AB
OPIATES: NOT DETECTED
TETRAHYDROCANNABINOL: POSITIVE — AB

## 2018-03-15 LAB — ETHANOL: Alcohol, Ethyl (B): <10 mg/dL (ref <10)

## 2018-03-15 MED ORDER — AMPHETAMINE-DEXTROAMPHETAMINE 30 MG PO TABS: 30.0000 mg | ORAL_TABLET | Freq: Three times a day (TID) | ORAL | Status: DC

## 2018-03-15 MED ORDER — CLONIDINE HCL 0.1 MG PO TABS
0.3000 mg | ORAL_TABLET | Freq: Two times a day (BID) | ORAL | Status: DC
Start: 2018-03-15 — End: 2018-03-16
  Administered 2018-03-15 – 2018-03-16 (×2): 0.3 mg via ORAL
  Filled 2018-03-15 (×2): qty 3

## 2018-03-15 MED ORDER — LORAZEPAM 1 MG PO TABS
0.0000 mg | ORAL_TABLET | Freq: Four times a day (QID) | ORAL | Status: DC
  Administered 2018-03-15: 2 mg via ORAL
  Filled 2018-03-15: qty 2

## 2018-03-15 MED ORDER — ONDANSETRON HCL 4 MG PO TABS: 4.0000 mg | ORAL_TABLET | Freq: Three times a day (TID) | ORAL | Status: DC | PRN

## 2018-03-15 MED ORDER — AMPHETAMINE-DEXTROAMPHETAMINE 10 MG PO TABS: 15.0000 mg | ORAL_TABLET | ORAL | Status: DC

## 2018-03-15 MED ORDER — ACETAMINOPHEN 325 MG PO TABS: 650.0000 mg | ORAL_TABLET | ORAL | Status: DC | PRN

## 2018-03-15 MED ORDER — ALUM & MAG HYDROXIDE-SIMETH 200-200-20 MG/5ML PO SUSP: 30.0000 mL | Freq: Four times a day (QID) | ORAL | Status: DC | PRN

## 2018-03-15 MED ORDER — HYDROXYZINE HCL 10 MG PO TABS: 10.0000 mg | ORAL_TABLET | Freq: Three times a day (TID) | ORAL | Status: DC | PRN

## 2018-03-15 MED ORDER — LORAZEPAM 2 MG/ML IJ SOLN: 0.0000 mg | Freq: Two times a day (BID) | INTRAMUSCULAR | Status: DC

## 2018-03-15 MED ORDER — FLUOXETINE HCL 20 MG PO CAPS
20.0000 mg | ORAL_CAPSULE | Freq: Every day | ORAL | Status: DC
Start: 2018-03-15 — End: 2018-03-16
  Administered 2018-03-15 – 2018-03-16 (×2): 20 mg via ORAL
  Filled 2018-03-15 (×2): qty 1

## 2018-03-15 MED ORDER — LORAZEPAM 2 MG/ML IJ SOLN: 0.0000 mg | Freq: Four times a day (QID) | INTRAMUSCULAR | Status: DC

## 2018-03-15 MED ORDER — AMPHETAMINE-DEXTROAMPHETAMINE 20 MG PO TABS: 30.0000 mg | ORAL_TABLET | Freq: Two times a day (BID) | ORAL | Status: DC

## 2018-03-15 MED ORDER — LORAZEPAM 1 MG PO TABS: 0.0000 mg | ORAL_TABLET | Freq: Two times a day (BID) | ORAL | Status: DC

## 2018-03-15 MED ORDER — MELOXICAM 15 MG PO TABS
15.0000 mg | ORAL_TABLET | Freq: Every day | ORAL | Status: DC
  Administered 2018-03-15 – 2018-03-16 (×2): 15 mg via ORAL
  Filled 2018-03-15 (×3): qty 1

## 2018-03-15 MED ORDER — PREGABALIN 50 MG PO CAPS
150.0000 mg | ORAL_CAPSULE | Freq: Two times a day (BID) | ORAL | Status: DC
  Administered 2018-03-15: 150 mg via ORAL
  Filled 2018-03-15 (×2): qty 3

## 2018-03-15 MED ORDER — THIAMINE HCL 100 MG/ML IJ SOLN: 100.0000 mg | Freq: Every day | INTRAMUSCULAR | Status: DC

## 2018-03-15 MED ORDER — ZOLPIDEM TARTRATE 5 MG PO TABS
5.0000 mg | ORAL_TABLET | Freq: Every evening | ORAL | Status: DC | PRN
  Administered 2018-03-15: 5 mg via ORAL
  Filled 2018-03-15: qty 1

## 2018-03-15 MED ORDER — NICOTINE 21 MG/24HR TD PT24: 21.0000 mg | MEDICATED_PATCH | Freq: Every day | TRANSDERMAL | Status: DC

## 2018-03-15 MED ORDER — VITAMIN B-1 100 MG PO TABS
100.0000 mg | ORAL_TABLET | Freq: Every day | ORAL | Status: DC
  Administered 2018-03-15 – 2018-03-16 (×2): 100 mg via ORAL
  Filled 2018-03-15 (×2): qty 1

## 2018-03-15 NOTE — ED Provider Notes (Signed)
[facility] DEPT Provider Note   CSN: [id number] Arrival date & time: 03/15/18  1723     History   Chief Complaint Chief Complaint  Patient presents with  . IVC    HPI David [name] is a 62 y.o. male with a hx of HTN, ADD, and depression who presents to the ED under IVC.  Per IVC paperwork patient is a danger to harm himself and others. He suffers from PTSD. He has been diagnosed with manic-depressive disorder in the past. He has hx of commitment in [facility], [facility], and [facility]. He has treated his wife and many others. They feel he has been taking excess meds and drinking excessively past 3-5 days. Patient IVCd by Delrae Alfred- relationship to patient: Aunt.   Per patient he does not feel he needs to be here. He denies SI/HI/hallucinations. He states that he does drink daily, 6 beers per day, not necessarily anymore than usual recently.  He has not had history of alcohol withdrawal in the past.  Denies drug use. No complaints at this time other than being aggravated that he is here. No other specific alleviating/aggravating factors. Denies fever, chills, nausea, vomiting, hematemesis, blood in stool, abdominal pain, chest pain, dyspnea, syncope, or any other concerns.   HPI  Past Medical History:  Diagnosis Date  . ADD (attention deficit disorder)   . Depression   . HTN (hypertension)     There are no active problems to display for this patient.   Past Surgical History:  Procedure Laterality Date  . BACK SURGERY          Home Medications    Prior to Admission medications   Medication Sig Start Date End Date Taking? Authorizing Provider  amphetamine-dextroamphetamine (ADDERALL) 30 MG tablet Take 30 mg by mouth 3 (three) times daily. 03/08/18  Yes [provider]  cloNIDine (CATAPRES) 0.3 MG tablet Take 0.3 mg by mouth 2 (two) times daily.    Yes [provider]  FLUoxetine (PROZAC) 20 MG capsule  Take 20 mg by mouth daily.    Yes [provider]  hydrOXYzine (ATARAX/VISTARIL) 10 MG tablet Take 10 mg by mouth 3 (three) times daily as needed for anxiety.    Yes [provider]  meloxicam (MOBIC) 15 MG tablet Take 1 tablet (15 mg total) by mouth daily. 03/05/18  Yes Dickie La, MD  pregabalin (LYRICA) 150 MG capsule Take 150 mg by mouth 2 (two) times daily.    Yes [provider]  triamcinolone cream (KENALOG) 0.1 % Apply topically 2 (two) times daily. 09/03/12  Yes Kandra Nicolas, MD  zolpidem (AMBIEN) 10 MG tablet Take 10 mg by mouth at bedtime as needed for sleep.    Yes [provider]    Family History Family History  Problem Relation Age of Onset  . Arthritis Unknown   . Heart disease Unknown   . Cancer Unknown   . Hypertension Unknown     Social History Social History   Tobacco Use  . Smoking status: Current Every Day Smoker    Packs/day: 1.00    Years: 25.00    Pack years: 25.00  . Smokeless tobacco: Never Used  Substance Use Topics  . Alcohol use: Yes    Comment: daily  . Drug use: Yes    Types: Marijuana     Allergies   Codeine   Review of Systems Review of Systems  Constitutional: Negative for chills and fever.  Eyes: Negative for visual disturbance.  Respiratory: Negative for shortness of breath.   Cardiovascular: Negative for chest pain.  Gastrointestinal: Negative for abdominal pain, blood in stool, constipation, diarrhea and vomiting.  Neurological: Negative for dizziness, syncope, light-headedness and headaches.  Psychiatric/Behavioral: Negative for hallucinations, self-injury and suicidal ideas.  All other systems reviewed and are negative.    Physical Exam Updated Vital Signs BP (!) 163/85 (BP Location: Right Arm)   Pulse 93   Temp 98.9 F (37.2 C) (Oral)   Resp 16   SpO2 98%   Physical Exam  Constitutional: He is oriented to person, place, and time. He appears well-developed and well-nourished.  No distress.  HENT:  Head: Normocephalic and atraumatic.  Eyes: Conjunctivae are normal. Right eye exhibits no discharge. Left eye exhibits no discharge.  Cardiovascular: Normal rate and regular rhythm.  No murmur heard. Pulmonary/Chest: Breath sounds normal. No respiratory distress. He has no wheezes. He has no rales.  Abdominal: Soft. He exhibits no distension. There is no tenderness. There is no rebound and no guarding.  Neurological: He is alert and oriented to person, place, and time.  Clear speech.   Skin: Skin is warm and dry. No rash noted.  Psychiatric:  Patient with somewhat rapid speech, appears somewhat agitated, however very cooperative.   Nursing note and vitals reviewed.    ED Treatments / Results  Labs Results for orders placed or performed during the hospital encounter of 03/15/18  Comprehensive metabolic panel  Result Value Ref Range   Sodium 140 135 - 145 mmol/L   Potassium 4.0 3.5 - 5.1 mmol/L   Chloride 111 98 - 111 mmol/L   CO2 20 (L) 22 - 32 mmol/L   Glucose, Bld 101 (H) 70 - 99 mg/dL   BUN 14 8 - 23 mg/dL   Creatinine, Ser 1.24 0.61 - 1.24 mg/dL   Calcium 9.0 8.9 - 10.3 mg/dL   Total Protein 7.8 6.5 - 8.1 g/dL   Albumin 4.0 3.5 - 5.0 g/dL   AST 35 15 - 41 U/L   ALT 27 0 - 44 U/L   Alkaline Phosphatase 114 38 - 126 U/L   Total Bilirubin 0.7 0.3 - 1.2 mg/dL   GFR calc non Af Amer >60 >60 mL/min   GFR calc Af Amer >60 >60 mL/min   Anion gap 9 5 - 15  Ethanol  Result Value Ref Range   Alcohol, Ethyl (B) <10 <10 mg/dL  cbc  Result Value Ref Range   WBC 9.5 4.0 - 10.5 K/uL   RBC 3.68 (L) 4.22 - 5.81 MIL/uL   Hemoglobin 9.3 (L) 13.0 - 17.0 g/dL   HCT 29.0 (L) 39.0 - 52.0 %   MCV 78.8 78.0 - 100.0 fL   MCH 25.3 (L) 26.0 - 34.0 pg   MCHC 32.1 30.0 - 36.0 g/dL   RDW 17.6 (H) 11.5 - 15.5 %   Platelets 448 (H) 150 - 400 K/uL  Rapid urine drug screen (hospital performed)  Result Value Ref Range   Opiates NONE DETECTED NONE DETECTED   Cocaine POSITIVE  (A) NONE DETECTED   Benzodiazepines POSITIVE (A) NONE DETECTED   Amphetamines POSITIVE (A) NONE DETECTED   Tetrahydrocannabinol POSITIVE (A) NONE DETECTED   Barbiturates (A) NONE DETECTED    Result not available. Reagent lot number recalled by manufacturer.    EKG None  Radiology No results found.  Procedures Procedures (including critical care time)  Medications Ordered in ED Medications - No data to display  Initial Impression / Assessment and Plan / ED Course  I have reviewed the triage vital signs and the nursing notes.  Pertinent labs & imaging results that were available during my care of the patient were reviewed by me and considered in my medical decision making (see chart for details).   Patient presents to the emergency department under IVC requiring medical clearance.  Patient nontoxic-appearing, cooperative, vitals WNL with the exception of elevated blood pressure, patient has not taken his antihypertensive medication today, doubt hypertensive emergency.  Lab work reviewed and notable for hemoglobin of 9.3, no recent labs to compare for baseline, he has no complaints of lightheadedness, dizziness, chest pain, dyspnea, or syncopal episodes, denies vomiting, abdominal pain, or blood in stool.  This will require primary care provider recheck, unclear if new finding, discussed with patient and information placed in discharge instructions as well.    Patient has been medically cleared for TTS evaluation.  Disposition per behavioral health.  Holding orders have been placed.  22:00: CONSULT: Discussed case with Beverely Low with behavioral health- spoke with psychiatric team NP- recommends observation overnight with psych eval in the AM.   Final Clinical Impressions(s) / ED Diagnoses   Final diagnoses:  Anemia, unspecified type    ED Discharge Orders    None       Leafy Kindle 03/15/18 2352    Carmin Muskrat, MD 03/17/18 Benancio Deeds

## 2018-03-15 NOTE — ED Notes (Signed)
TTS in progress 

## 2018-03-15 NOTE — ED Notes (Signed)
Pt currently sleeping, eyes closed. No s/s of distress noted. Breathing non labored, respirations equal.

## 2018-03-15 NOTE — ED Notes (Signed)
Patient is resting comfortably. 

## 2018-03-15 NOTE — ED Triage Notes (Signed)
Per paper work-states patient is manic and drinking heavily-states patient is threatening his family-patient has been committed in the past

## 2018-03-15 NOTE — BH Assessment (Addendum)
Assessment Note  David [name] is an 62 y.o. male.  -Clinician reviewed note by Kennith Maes, PA.  David [name] is a 62 y.o. male with a hx of HTN, ADD, and depression who presents to the ED under IVC. Per IVC paperwork patient is a danger to harm himself and others. He suffers from PTSD. He has been diagnosed with manic-depressive disorder in the past. He has hx of commitment in [facility], [facility], and [facility]. He has treated his wife and many others. They feel he has been taking excess meds and drinking excessively past 3-5 days.   Patient does not understand how he ended up being brought to [facility] under commitment.  Patient says that he thinks he knows who IVC'ed him.  He says that this person is delusional.  He talks about how they don't know him and are making up lies.  Patient denies making any threats towards anyone.  He and wife are separated and he lives alone.  Patient does not want to be here because his son has surgery at [facility] tomorrow.  He also says he needs to visit with his mother who is at [facility].    Patient denies any SI or intention or plan to kill self.  He also denies any HI.  He denies A/V hallucinations also.  Patient admits to use of ETOH daily.  Usually a 6 pack per day.  He thinks he had one beer today however and does not complain of any withdrawal symptoms.  Uses marijuana 2-3 times during the week.  He also uses cocaine "rarely."  Patient says he was at [facility] [place] back in the '80's.  He does not remember being at [facility]).  He admits that he sometimes runs out of Ambien before he is supposed to but denies abusing it.  Patient is seen by Dr. [name] at [facility] for the last 19 years.  He says he has a appt coming up this month (July) and invites anyone to call Dr. [name] to talk about his case.  -Clinician discussed patient care with Lindon Romp, FNP.  He recommends observing patient  overnight and having psychiatry see patient in AM to complete 1st opinion.  Clinician discussed this with Kennith Maes, PA also.  She is in agreeement.   Diagnosis: F10.20 ETOH use d/o severe; F14.10 Cocaine use d/o mild; F12.20 Cannabis use d/o moderate; F33.2 MDD recurrent severe  Past Medical History:  Past Medical History:  Diagnosis Date  . ADD (attention deficit disorder)   . Depression   . HTN (hypertension)     Past Surgical History:  Procedure Laterality Date  . BACK SURGERY      Family History:  Family History  Problem Relation Age of Onset  . Arthritis Unknown   . Heart disease Unknown   . Cancer Unknown   . Hypertension Unknown     Social History:  reports that he has been smoking.  He has a 25.00 pack-year smoking history. He has never used smokeless tobacco. He reports that he drinks alcohol. He reports that he has current or past drug history. Drug: Marijuana.  Additional Social History:  Alcohol / Drug Use Pain Medications: Lyrica Prescriptions: Prozac, Ambien, Hydroxizine, Adderall, Colonidine Over the Counter: None History of alcohol / drug use?: Yes Substance #1 Name of Substance 1: ETOH (beer) 1 - Age of First Use: Teens 1 - Amount (size/oz): 6 pack per day 1 - Frequency: Daily  1 - Duration: Last 5 years at least 1 - Last Use / Amount: 07/01 maybe one beer Substance #2 Name of Substance 2: Cocaine (powder) 2 - Age of First Use: 20's 2 - Amount (size/oz): A couple of lines a few days ago 2 - Frequency: "very rare" 2 - Duration: off and on 2 - Last Use / Amount: Pt can't recall Substance #3 Name of Substance 3: Marijuana 3 - Age of First Use: teens 3 - Amount (size/oz): A joint maybe 2-3 times in a week. 3 - Frequency: Two or three times in a week. 3 - Duration: off and on 3 - Last Use / Amount: Pt can't recall.  CIWA: CIWA-Ar BP: (!) 167/88 Pulse Rate: 81 Nausea and Vomiting: no nausea and no vomiting Tactile Disturbances:  none Tremor: not visible, but can be felt fingertip to fingertip Auditory Disturbances: very mild harshness or ability to frighten Paroxysmal Sweats: no sweat visible Visual Disturbances: very mild sensitivity Anxiety: moderately anxious, or guarded, so anxiety is inferred Headache, Fullness in Head: none present Agitation: moderately fidgety and restless Orientation and Clouding of Sensorium: oriented and can do serial additions CIWA-Ar Total: 11 COWS:    Allergies:  Allergies  Allergen Reactions  . Codeine     Home Medications:  (Not in a hospital admission)  OB/GYN Status:  No LMP for male patient.  General Assessment Data Assessment unable to be completed: Yes Reason for not completing assessment: Pt too sleepy to participate. Location of Assessment: [facility] ED TTS Assessment: In system Is this a Tele or Face-to-Face Assessment?: Face-to-Face Is this an Initial Assessment or a Re-assessment for this encounter?: Initial Assessment Marital status: Separated Is patient pregnant?: No Pregnancy Status: No Living Arrangements: Alone Can pt return to current living arrangement?: Yes Admission Status: Involuntary Is patient capable of signing voluntary admission?: No Referral Source: Self/Family/Friend Insurance type: [employer]     Crisis Care Plan Living Arrangements: Alone Name of Psychiatrist: [facility] [name] at [facility] Name of Therapist: None  Education Status Is patient currently in school?: No Is the patient employed, unemployed or receiving disability?: Receiving disability income  Risk to self with the past 6 months Suicidal Ideation: No Has patient been a risk to self within the past 6 months prior to admission? : No Suicidal Intent: No Has patient had any suicidal intent within the past 6 months prior to admission? : No Is patient at risk for suicide?: No Suicidal Plan?: No Has patient had any suicidal plan within the past 6 months prior to  admission? : No Access to Means: No What has been your use of drugs/alcohol within the last 12 months?: THC, ETOH, Cocaine Previous Attempts/Gestures: No How many times?: 0 Other Self Harm Risks: None Triggers for Past Attempts: None known Intentional Self Injurious Behavior: None Family Suicide History: No Recent stressful life event(s): Turmoil (Comment)(Mother in a rest home.  Son has to have back surgery.) Persecutory voices/beliefs?: No Depression: Yes Depression Symptoms: Feeling angry/irritable, Insomnia, Guilt Substance abuse history and/or treatment for substance abuse?: Yes Suicide prevention information given to non-admitted patients: Not applicable  Risk to Others within the past 6 months Homicidal Ideation: No Does patient have any lifetime risk of violence toward others beyond the six months prior to admission? : No Thoughts of Harm to Others: No Current Homicidal Intent: No Current Homicidal Plan: No Access to Homicidal Means: No Identified Victim: No one History of harm to others?: No Assessment of Violence: In distant past Violent Behavior Description:  Pt can't remember the last fight. Does patient have access to weapons?: No Criminal Charges Pending?: No Does patient have a court date: No Is patient on probation?: No  Psychosis Hallucinations: None noted Delusions: None noted  Mental Status Report Appearance/Hygiene: Unremarkable, In scrubs Eye Contact: Good Motor Activity: Agitation, Restlessness Speech: Logical/coherent, Aggressive Level of Consciousness: Alert, Irritable Mood: Anxious, Sad Affect: Anxious, Sad Anxiety Level: Moderate Thought Processes: Coherent, Relevant Judgement: Impaired Orientation: Person, Place, Situation, Time Obsessive Compulsive Thoughts/Behaviors: None  Cognitive Functioning Concentration: Poor Memory: Recent Impaired, Remote Intact Is patient IDD: No Is patient DD?: Yes Insight: Fair Impulse Control:  Poor Appetite: Fair Have you had any weight changes? : No Change Sleep: No Change Total Hours of Sleep: 8 Vegetative Symptoms: None  ADLScreening [facility]) Patient's cognitive ability adequate to safely complete daily activities?: Yes Patient able to express need for assistance with ADLs?: Yes Independently performs ADLs?: Yes (appropriate for developmental age)  Prior Inpatient Therapy Prior Inpatient Therapy: Yes Prior Therapy Dates: Back in the '80's Prior Therapy Facilty/Provider(s): [facility] & [facility]) Reason for Treatment: SA  Prior Outpatient Therapy Prior Outpatient Therapy: Yes Prior Therapy Dates: 19 years to current Prior Therapy Facilty/Provider(s): Dr. [name] at [facility] Reason for Treatment: med management Does patient have an ACCT team?: No Does patient have Intensive In-House Services?  : No Does patient have [facility] services? : No Does patient have P4CC services?: No  ADL Screening (condition at time of admission) Patient's cognitive ability adequate to safely complete daily activities?: Yes Is the patient deaf or have difficulty hearing?: No Does the patient have difficulty seeing, even when wearing glasses/contacts?: No Does the patient have difficulty concentrating, remembering, or making decisions?: Yes Patient able to express need for assistance with ADLs?: Yes Does the patient have difficulty dressing or bathing?: No Independently performs ADLs?: Yes (appropriate for developmental age) Does the patient have difficulty walking or climbing stairs?: Yes(Pt uses a cane.) Weakness of Legs: Left Weakness of Arms/Hands: None       Abuse/Neglect Assessment (Assessment to be complete while patient is alone) Abuse/Neglect Assessment Can Be Completed: Yes Physical Abuse: Denies Verbal Abuse: Denies Sexual Abuse: Denies Exploitation of patient/patient's resources: Denies Self-Neglect: Denies     Regulatory affairs officer  (For Healthcare) Does Patient Have a Medical Advance Directive?: No Would patient like information on creating a medical advance directive?: No - Patient declined          Disposition:  Disposition Initial Assessment Completed for this Encounter: Yes Patient referred to: Other (Comment)(To be reviewed by FNP)  On Site Evaluation by:   Reviewed with Physician:    Curlene Dolphin Ray 03/15/2018 9:00 PM

## 2018-03-15 NOTE — ED Notes (Signed)
Pt to room #42. Pt anxious, speech pressured, tangential, disorganized. Pt blaming "a stranger" for being in the hospital. Pt appears to have poor insight. Pt wanting to leave. Pt presents under IVC. Special checks q 15 mins in place for safety, Video monitoring in place. Will continue to monitor.

## 2018-03-15 NOTE — BH Assessment (Signed)
[facility] Assessment Progress Note   Clinician informed by nurse Dierdre Forth that patient had been very agitated earlier per report.  Patient is sleeping now.  TTS will assess when patient is alert and oriented.

## 2018-03-15 NOTE — Discharge Instructions (Addendum)
Your hemoglobin, a measure of blood, was low in the emergency department today, it is important that you have this rechecked by your primary care provider within 1 week.  For your behavioral health needs, you are advised to continue treatment with Lynder Parents, MD at [facility]:       [address] [place], [place] [address]      [phone]

## 2018-03-16 ENCOUNTER — Encounter (HOSPITAL_COMMUNITY): Payer: Self-pay | Admitting: *Deleted

## 2018-03-16 DIAGNOSIS — F1014 Alcohol abuse with alcohol-induced mood disorder: Secondary | ICD-10-CM | POA: Diagnosis not present

## 2018-03-16 MED ORDER — GABAPENTIN 300 MG PO CAPS
300.0000 mg | ORAL_CAPSULE | Freq: Three times a day (TID) | ORAL | Status: DC
  Administered 2018-03-16: 300 mg via ORAL
  Filled 2018-03-16: qty 1

## 2018-03-16 MED ORDER — GABAPENTIN 300 MG PO CAPS: 300.0000 mg | ORAL_CAPSULE | Freq: Three times a day (TID) | ORAL | 0 refills | Status: DC

## 2018-03-16 NOTE — ED Notes (Signed)
Pt d/c home per MD order. Discharge summary reviewed with pt. Pt verbalizes understanding. RX provided. Pt denies SI/HI/AVH. Personal property returned to pt. Pt signed e-signature. Ambulatory off unit with [facility].

## 2018-03-16 NOTE — [facility] Suicide Risk Assessment (Signed)
Suicide Risk Assessment  Discharge Assessment   [facility] Discharge Suicide Risk Assessment   Principal Problem: Alcohol abuse with alcohol-induced mood disorder [facility]) Discharge Diagnoses:  Patient Active Problem List   Diagnosis Date Noted  . Alcohol abuse with alcohol-induced mood disorder ([place]) [F10.14] 03/16/2018    Priority: High    Total Time spent with patient: 45 minutes  Musculoskeletal: Strength & Muscle Tone: within normal limits Gait & Station: normal Patient leans: N/A  Psychiatric Specialty Exam: Physical Exam  Nursing note and vitals reviewed. Constitutional: He is oriented to person, place, and time. He appears well-developed and well-nourished.  HENT:  Head: Normocephalic.  Neck: Normal range of motion.  Respiratory: Effort normal.  Musculoskeletal: Normal range of motion.  Neurological: He is alert and oriented to person, place, and time.  Psychiatric: His speech is normal and behavior is normal. Judgment and thought content normal. His mood appears anxious. Cognition and memory are normal.    Review of Systems  Constitutional: Positive for malaise/fatigue.  Neurological: Positive for tremors.  Psychiatric/Behavioral: Positive for substance abuse. The patient is nervous/anxious.   All other systems reviewed and are negative.   Blood pressure 138/87, pulse 63, temperature 98.6 F (37 C), temperature source Oral, resp. rate 17, SpO2 100 %.There is no height or weight on file to calculate BMI.  General Appearance: Casual  Eye Contact:  Good  Speech:  Normal Rate  Volume:  Normal  Mood:  Anxious and depression, mild  Affect:  Appropriate  Thought Process:  Coherent and Descriptions of Associations: Intact  Orientation:  Full (Time, Place, and Person)  Thought Content:  WDL and Logical  Suicidal Thoughts:  No  Homicidal Thoughts:  No  Memory:  Immediate;   Good Recent;   Good Remote;   Good  Judgement:  Fair  Insight:  Fair  Psychomotor Activity:   Decreased  Concentration:  Concentration: Fair and Attention Span: Fair  Recall:  Good  Fund of Knowledge:  Good  Language:  Good  Akathisia:  No  Handed:  Right  AIMS (if indicated):     Assets:  Housing Leisure Time Physical Health Resilience Social Support  ADL's:  Intact  Cognition:  WNL  Sleep:        Mental Status Per Nursing Assessment::   On Admission:   alcohol and cocaine abuse  Demographic Factors:  Male, Caucasian and Living alone  Loss Factors: NA  Historical Factors: NA  Risk Reduction Factors:   Sense of responsibility to family, Living with another person, especially a relative, Positive social support and Positive therapeutic relationship  Continued Clinical Symptoms:  Depression and anxiety, mild  Cognitive Features That Contribute To Risk:  None    Suicide Risk:  Minimal: No identifiable suicidal ideation.  Patients presenting with no risk factors but with morbid ruminations; may be classified as minimal risk based on the severity of the depressive symptoms  Follow-up Information    [name], MD In 1 week.   Specialty:  Family Medicine Why:  Hemoglobin recheck. Contact information: [place] [address] [phone]           Plan Of Care/Follow-up recommendations:  Activity:  as tolerated Diet:  heart healthy diet  Rylann Munford, Theodoro Clock, NP 03/16/2018, 5:36 PM

## 2018-03-16 NOTE — ED Notes (Signed)
Peer support at bedside 
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
## 2018-03-16 NOTE — BH Assessment (Signed)
[facility] Assessment Progress Note  Per Buford Dresser, DO, this pt does not require psychiatric hospitalization at this time.  Pt is to be discharged from [facility] with recommendation to continue treatment with Lynder Parents, MD at [facility].  This has been included in pt's discharge instructions.  Pt would also benefit from seeing Peer Support Specialists; they will be asked to speak to pt.  Pt's nurse, Caryl Pina, has been notified.  Jalene Mullet, [place] Triage Specialist [phone]
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
## 2018-03-22 DIAGNOSIS — F319 Bipolar disorder, unspecified: Secondary | ICD-10-CM | POA: Diagnosis not present

## 2018-04-14 DIAGNOSIS — L603 Nail dystrophy: Secondary | ICD-10-CM | POA: Diagnosis not present

## 2018-04-14 DIAGNOSIS — Z85828 Personal history of other malignant neoplasm of skin: Secondary | ICD-10-CM | POA: Diagnosis not present

## 2018-04-14 DIAGNOSIS — L400 Psoriasis vulgaris: Secondary | ICD-10-CM | POA: Diagnosis not present

## 2018-05-07 DIAGNOSIS — L400 Psoriasis vulgaris: Secondary | ICD-10-CM | POA: Diagnosis not present

## 2018-05-07 DIAGNOSIS — Z85828 Personal history of other malignant neoplasm of skin: Secondary | ICD-10-CM | POA: Diagnosis not present

## 2018-05-27 DIAGNOSIS — F319 Bipolar disorder, unspecified: Secondary | ICD-10-CM | POA: Diagnosis not present

## 2018-06-23 ENCOUNTER — Emergency Department (HOSPITAL_COMMUNITY): Payer: PPO

## 2018-06-23 ENCOUNTER — Other Ambulatory Visit: Payer: Self-pay

## 2018-06-23 ENCOUNTER — Encounter (HOSPITAL_COMMUNITY): Payer: Self-pay | Admitting: Emergency Medicine

## 2018-06-23 ENCOUNTER — Inpatient Hospital Stay (HOSPITAL_COMMUNITY)
Admission: EM | Admit: 2018-06-23 | Discharge: 2018-06-26 | DRG: 481 | Disposition: A | Discharge status: Home Health | Payer: PPO | Attending: Internal Medicine | Admitting: Internal Medicine

## 2018-06-23 DIAGNOSIS — I10 Essential (primary) hypertension: Secondary | ICD-10-CM | POA: Diagnosis present

## 2018-06-23 DIAGNOSIS — F17210 Nicotine dependence, cigarettes, uncomplicated: Secondary | ICD-10-CM | POA: Diagnosis not present

## 2018-06-23 DIAGNOSIS — M542 Cervicalgia: Secondary | ICD-10-CM | POA: Diagnosis not present

## 2018-06-23 DIAGNOSIS — D72829 Elevated white blood cell count, unspecified: Secondary | ICD-10-CM | POA: Diagnosis present

## 2018-06-23 DIAGNOSIS — F1910 Other psychoactive substance abuse, uncomplicated: Secondary | ICD-10-CM | POA: Diagnosis not present

## 2018-06-23 DIAGNOSIS — F1014 Alcohol abuse with alcohol-induced mood disorder: Secondary | ICD-10-CM | POA: Diagnosis not present

## 2018-06-23 DIAGNOSIS — R51 Headache: Secondary | ICD-10-CM | POA: Diagnosis not present

## 2018-06-23 DIAGNOSIS — F418 Other specified anxiety disorders: Secondary | ICD-10-CM | POA: Diagnosis present

## 2018-06-23 DIAGNOSIS — Y92009 Unspecified place in unspecified non-institutional (private) residence as the place of occurrence of the external cause: Secondary | ICD-10-CM | POA: Diagnosis not present

## 2018-06-23 DIAGNOSIS — D509 Iron deficiency anemia, unspecified: Secondary | ICD-10-CM | POA: Diagnosis not present

## 2018-06-23 DIAGNOSIS — I160 Hypertensive urgency: Secondary | ICD-10-CM | POA: Diagnosis present

## 2018-06-23 DIAGNOSIS — M25551 Pain in right hip: Secondary | ICD-10-CM | POA: Diagnosis not present

## 2018-06-23 DIAGNOSIS — S8991XA Unspecified injury of right lower leg, initial encounter: Secondary | ICD-10-CM | POA: Diagnosis not present

## 2018-06-23 DIAGNOSIS — S72001A Fracture of unspecified part of neck of right femur, initial encounter for closed fracture: Secondary | ICD-10-CM | POA: Diagnosis not present

## 2018-06-23 DIAGNOSIS — R52 Pain, unspecified: Secondary | ICD-10-CM | POA: Diagnosis not present

## 2018-06-23 DIAGNOSIS — M79604 Pain in right leg: Secondary | ICD-10-CM | POA: Diagnosis not present

## 2018-06-23 DIAGNOSIS — S299XXA Unspecified injury of thorax, initial encounter: Secondary | ICD-10-CM | POA: Diagnosis not present

## 2018-06-23 DIAGNOSIS — S0990XA Unspecified injury of head, initial encounter: Secondary | ICD-10-CM | POA: Diagnosis not present

## 2018-06-23 DIAGNOSIS — F1210 Cannabis abuse, uncomplicated: Secondary | ICD-10-CM | POA: Diagnosis not present

## 2018-06-23 DIAGNOSIS — S72041A Displaced fracture of base of neck of right femur, initial encounter for closed fracture: Secondary | ICD-10-CM | POA: Diagnosis not present

## 2018-06-23 DIAGNOSIS — S199XXA Unspecified injury of neck, initial encounter: Secondary | ICD-10-CM | POA: Diagnosis not present

## 2018-06-23 DIAGNOSIS — W010XXA Fall on same level from slipping, tripping and stumbling without subsequent striking against object, initial encounter: Secondary | ICD-10-CM | POA: Diagnosis present

## 2018-06-23 LAB — RAPID URINE DRUG SCREEN, HOSP PERFORMED
Amphetamines: NOT DETECTED
Barbiturates: NOT DETECTED
Benzodiazepines: POSITIVE — AB
Cocaine: POSITIVE — AB
OPIATES: NOT DETECTED
TETRAHYDROCANNABINOL: NOT DETECTED

## 2018-06-23 LAB — CBC WITH DIFFERENTIAL/PLATELET
Abs Immature Granulocytes: 0.08 10*3/uL — ABNORMAL HIGH (ref 0.00–0.07)
BASOS PCT: 0 %
Basophils Absolute: 0.1 10*3/uL (ref 0.0–0.1)
EOS PCT: 0 %
Eosinophils Absolute: 0.1 10*3/uL (ref 0.0–0.5)
HCT: 35.5 % — ABNORMAL LOW (ref 39.0–52.0)
Hemoglobin: 10.4 g/dL — ABNORMAL LOW (ref 13.0–17.0)
Immature Granulocytes: 1 %
Lymphocytes Relative: 13 %
Lymphs Abs: 1.9 10*3/uL (ref 0.7–4.0)
MCH: 22.7 pg — AB (ref 26.0–34.0)
MCHC: 29.3 g/dL — AB (ref 30.0–36.0)
MCV: 77.5 fL — ABNORMAL LOW (ref 80.0–100.0)
MONO ABS: 0.8 10*3/uL (ref 0.1–1.0)
Monocytes Relative: 6 %
Neutro Abs: 11.2 10*3/uL — ABNORMAL HIGH (ref 1.7–7.7)
Neutrophils Relative %: 80 %
Platelets: 429 10*3/uL — ABNORMAL HIGH (ref 150–400)
RBC: 4.58 MIL/uL (ref 4.22–5.81)
RDW: 19.6 % — AB (ref 11.5–15.5)
WBC: 14.2 10*3/uL — AB (ref 4.0–10.5)
nRBC: 0 % (ref 0.0–0.2)

## 2018-06-23 LAB — URINALYSIS, ROUTINE W REFLEX MICROSCOPIC
BILIRUBIN URINE: NEGATIVE
Glucose, UA: NEGATIVE mg/dL
Hgb urine dipstick: NEGATIVE
Ketones, ur: NEGATIVE mg/dL
LEUKOCYTES UA: NEGATIVE
NITRITE: NEGATIVE
PH: 6 (ref 5.0–8.0)
Protein, ur: NEGATIVE mg/dL
Specific Gravity, Urine: 1.004 — ABNORMAL LOW (ref 1.005–1.030)

## 2018-06-23 LAB — BASIC METABOLIC PANEL
Anion gap: 9 (ref 5–15)
BUN: 9 mg/dL (ref 8–23)
CO2: 27 mmol/L (ref 22–32)
CREATININE: 0.9 mg/dL (ref 0.61–1.24)
Calcium: 9.2 mg/dL (ref 8.9–10.3)
Chloride: 106 mmol/L (ref 98–111)
Glucose, Bld: 101 mg/dL — ABNORMAL HIGH (ref 70–99)
POTASSIUM: 3.8 mmol/L (ref 3.5–5.1)
SODIUM: 142 mmol/L (ref 135–145)

## 2018-06-23 LAB — TYPE AND SCREEN
ABO/RH(D): O POS
Antibody Screen: NEGATIVE

## 2018-06-23 LAB — ETHANOL

## 2018-06-23 LAB — PROTIME-INR
INR: 0.87
Prothrombin Time: 11.7 seconds (ref 11.4–15.2)

## 2018-06-23 LAB — ABO/RH: ABO/RH(D): O POS

## 2018-06-23 MED ORDER — LORAZEPAM 2 MG/ML IJ SOLN
0.0000 mg | Freq: Four times a day (QID) | INTRAMUSCULAR | Status: AC
  Administered 2018-06-23 – 2018-06-25 (×2): 2 mg via INTRAVENOUS
  Administered 2018-06-25: 1 mg via INTRAVENOUS
  Filled 2018-06-23 (×3): qty 1
  Filled 2018-06-23: qty 2

## 2018-06-23 MED ORDER — SODIUM CHLORIDE 0.9 % IV BOLUS
1000.0000 mL | Freq: Once | INTRAVENOUS | Status: AC
  Administered 2018-06-23: 1000 mL via INTRAVENOUS

## 2018-06-23 MED ORDER — SODIUM CHLORIDE 0.9 % IV SOLN
INTRAVENOUS | Status: AC
  Administered 2018-06-23: 22:00:00 via INTRAVENOUS

## 2018-06-23 MED ORDER — FOLIC ACID 1 MG PO TABS
1.0000 mg | ORAL_TABLET | Freq: Every day | ORAL | Status: DC
  Administered 2018-06-23 – 2018-06-26 (×4): 1 mg via ORAL
  Filled 2018-06-23 (×4): qty 1

## 2018-06-23 MED ORDER — FENTANYL CITRATE (PF) 100 MCG/2ML IJ SOLN
50.0000 ug | Freq: Once | INTRAMUSCULAR | Status: AC
  Administered 2018-06-23: 50 ug via INTRAVENOUS
  Filled 2018-06-23: qty 2

## 2018-06-23 MED ORDER — LORAZEPAM 2 MG/ML IJ SOLN
1.0000 mg | Freq: Four times a day (QID) | INTRAMUSCULAR | Status: DC | PRN
  Administered 2018-06-24: 1 mg via INTRAVENOUS

## 2018-06-23 MED ORDER — THIAMINE HCL 100 MG/ML IJ SOLN
100.0000 mg | Freq: Every day | INTRAMUSCULAR | Status: DC
  Administered 2018-06-24: 100 mg via INTRAVENOUS
  Filled 2018-06-23 (×4): qty 1

## 2018-06-23 MED ORDER — ONDANSETRON HCL 4 MG/2ML IJ SOLN: 4.0000 mg | Freq: Four times a day (QID) | INTRAMUSCULAR | Status: DC | PRN

## 2018-06-23 MED ORDER — ADULT MULTIVITAMIN W/MINERALS CH
1.0000 | ORAL_TABLET | Freq: Every day | ORAL | Status: DC
  Administered 2018-06-23 – 2018-06-26 (×4): 1 via ORAL
  Filled 2018-06-23 (×4): qty 1

## 2018-06-23 MED ORDER — HYDRALAZINE HCL 20 MG/ML IJ SOLN: 10.0000 mg | INTRAMUSCULAR | Status: DC | PRN

## 2018-06-23 MED ORDER — VITAMIN B-1 100 MG PO TABS
100.0000 mg | ORAL_TABLET | Freq: Every day | ORAL | Status: DC
  Administered 2018-06-23 – 2018-06-26 (×3): 100 mg via ORAL
  Filled 2018-06-23 (×3): qty 1

## 2018-06-23 MED ORDER — FLUOXETINE HCL 20 MG PO CAPS
20.0000 mg | ORAL_CAPSULE | Freq: Every day | ORAL | Status: DC
  Administered 2018-06-24 – 2018-06-26 (×3): 20 mg via ORAL
  Filled 2018-06-23 (×3): qty 1

## 2018-06-23 MED ORDER — ZOLPIDEM TARTRATE 10 MG PO TABS
10.0000 mg | ORAL_TABLET | Freq: Every evening | ORAL | Status: DC | PRN
  Administered 2018-06-25: 10 mg via ORAL
  Filled 2018-06-23: qty 1

## 2018-06-23 MED ORDER — LORAZEPAM 2 MG/ML IJ SOLN
0.0000 mg | Freq: Two times a day (BID) | INTRAMUSCULAR | Status: DC
  Filled 2018-06-23: qty 1

## 2018-06-23 MED ORDER — SODIUM CHLORIDE 0.9 % IV SOLN
INTRAVENOUS | Status: DC
  Administered 2018-06-23: 20:00:00 via INTRAVENOUS

## 2018-06-23 MED ORDER — SENNOSIDES-DOCUSATE SODIUM 8.6-50 MG PO TABS: 1.0000 | ORAL_TABLET | Freq: Every evening | ORAL | Status: DC | PRN

## 2018-06-23 MED ORDER — CLONIDINE HCL 0.1 MG PO TABS
0.3000 mg | ORAL_TABLET | Freq: Two times a day (BID) | ORAL | Status: DC
  Administered 2018-06-23 – 2018-06-25 (×5): 0.3 mg via ORAL
  Filled 2018-06-23 (×7): qty 3

## 2018-06-23 MED ORDER — LORAZEPAM 1 MG PO TABS
1.0000 mg | ORAL_TABLET | Freq: Four times a day (QID) | ORAL | Status: DC | PRN
  Administered 2018-06-26: 1 mg via ORAL
  Filled 2018-06-23: qty 1

## 2018-06-23 MED ORDER — HYDROXYZINE HCL 10 MG PO TABS
10.0000 mg | ORAL_TABLET | Freq: Three times a day (TID) | ORAL | Status: DC | PRN
  Administered 2018-06-26: 10 mg via ORAL
  Filled 2018-06-23 (×2): qty 1

## 2018-06-23 MED ORDER — PREGABALIN 75 MG PO CAPS
150.0000 mg | ORAL_CAPSULE | Freq: Two times a day (BID) | ORAL | Status: DC
  Administered 2018-06-23 – 2018-06-26 (×6): 150 mg via ORAL
  Filled 2018-06-23: qty 3
  Filled 2018-06-23 (×3): qty 2
  Filled 2018-06-23: qty 3
  Filled 2018-06-23: qty 2

## 2018-06-23 MED ORDER — FENTANYL CITRATE (PF) 100 MCG/2ML IJ SOLN
25.0000 ug | INTRAMUSCULAR | Status: DC | PRN
  Administered 2018-06-24 – 2018-06-25 (×2): 25 ug via INTRAVENOUS
  Filled 2018-06-23 (×2): qty 2

## 2018-06-23 NOTE — Consult Note (Signed)
Reason for Consult: Right hip fracture Referring Physician: [name] is an 62 y.o. male.  HPI: 62 year old male status post fall earlier.  States he tripped on a pillow.  He has a history of alcohol dependency.  He was unable to ambulate and was evaluated in the emergency department.  Found to have impacted right femoral neck fracture.  Pain is severe with weightbearing, better with rest.  He denies other injury with the fall.  Past Medical History:  Diagnosis Date  . ADD (attention deficit disorder)   . Depression   . HTN (hypertension)     Past Surgical History:  Procedure Laterality Date  . BACK SURGERY      Family History  Problem Relation Age of Onset  . Arthritis Unknown   . Heart disease Unknown   . Cancer Unknown   . Hypertension Unknown     Social History:  reports that he has been smoking. He has a 12.50 pack-year smoking history. He has never used smokeless tobacco. He reports that he drinks alcohol. He reports that he has current or past drug history. Drug: Marijuana.  Allergies:  Allergies  Allergen Reactions  . Codeine     Medications: I have reviewed the patient's current medications.  Results for orders placed or performed during the hospital encounter of 06/23/18 (from the past 48 hour(s))  Urinalysis, Routine w reflex microscopic     Status: Abnormal   Collection Time: 06/23/18  5:18 PM  Result Value Ref Range   Color, Urine COLORLESS (A) YELLOW   APPearance CLEAR CLEAR   Specific Gravity, Urine 1.004 (L) 1.005 - 1.030   pH 6.0 5.0 - 8.0   Glucose, UA NEGATIVE NEGATIVE mg/dL   Hgb urine dipstick NEGATIVE NEGATIVE   Bilirubin Urine NEGATIVE NEGATIVE   Ketones, ur NEGATIVE NEGATIVE mg/dL   Protein, ur NEGATIVE NEGATIVE mg/dL   Nitrite NEGATIVE NEGATIVE   Leukocytes, UA NEGATIVE NEGATIVE    Comment: Performed at [facility] [address] [place], [place] [address]  Rapid urine drug screen (hospital performed)      Status: Abnormal   Collection Time: 06/23/18  5:18 PM  Result Value Ref Range   Opiates NONE DETECTED NONE DETECTED   Cocaine POSITIVE (A) NONE DETECTED   Benzodiazepines POSITIVE (A) NONE DETECTED   Amphetamines NONE DETECTED NONE DETECTED   Tetrahydrocannabinol NONE DETECTED NONE DETECTED   Barbiturates NONE DETECTED NONE DETECTED    Comment: (NOTE) DRUG SCREEN FOR MEDICAL PURPOSES ONLY.  IF CONFIRMATION IS NEEDED FOR ANY PURPOSE, NOTIFY LAB WITHIN 5 DAYS. LOWEST DETECTABLE LIMITS FOR URINE DRUG SCREEN Drug Class                     Cutoff (ng/mL) Amphetamine and metabolites    1000 Barbiturate and metabolites    200 Benzodiazepine                 465 Tricyclics and metabolites     300 Opiates and metabolites        300 Cocaine and metabolites        300 THC                            50 Performed at  [facility], [place] [address], [place], [place] [address]   Basic metabolic panel     Status: Abnormal   Collection Time: 06/23/18  6:03 PM  Result Value  Ref Range   Sodium 142 135 - 145 mmol/L   Potassium 3.8 3.5 - 5.1 mmol/L   Chloride 106 98 - 111 mmol/L   CO2 27 22 - 32 mmol/L   Glucose, Bld 101 (H) 70 - 99 mg/dL   BUN 9 8 - 23 mg/dL   Creatinine, Ser 0.90 0.61 - 1.24 mg/dL   Calcium 9.2 8.9 - 10.3 mg/dL   GFR calc non Af Amer >60 >60 mL/min   GFR calc Af Amer >60 >60 mL/min    Comment: (NOTE) The eGFR has been calculated using the CKD EPI equation. This calculation has not been validated in all clinical situations. eGFR's persistently <60 mL/min signify possible Chronic Kidney Disease.    Anion gap 9 5 - 15    Comment: Performed at [facility], [place] [address] [place], [place] [address]  CBC WITH DIFFERENTIAL     Status: Abnormal   Collection Time: 06/23/18  6:03 PM  Result Value Ref Range   WBC 14.2 (H) 4.0 - 10.5 K/uL   RBC 4.58 4.22 - 5.81 MIL/uL   Hemoglobin 10.4 (L) 13.0 - 17.0 g/dL   HCT 35.5 (L) 39.0 - 52.0 %    MCV 77.5 (L) 80.0 - 100.0 fL   MCH 22.7 (L) 26.0 - 34.0 pg   MCHC 29.3 (L) 30.0 - 36.0 g/dL   RDW 19.6 (H) 11.5 - 15.5 %   Platelets 429 (H) 150 - 400 K/uL   nRBC 0.0 0.0 - 0.2 %   Neutrophils Relative % 80 %   Neutro Abs 11.2 (H) 1.7 - 7.7 K/uL   Lymphocytes Relative 13 %   Lymphs Abs 1.9 0.7 - 4.0 K/uL   Monocytes Relative 6 %   Monocytes Absolute 0.8 0.1 - 1.0 K/uL   Eosinophils Relative 0 %   Eosinophils Absolute 0.1 0.0 - 0.5 K/uL   Basophils Relative 0 %   Basophils Absolute 0.1 0.0 - 0.1 K/uL   Immature Granulocytes 1 %   Abs Immature Granulocytes 0.08 (H) 0.00 - 0.07 K/uL    Comment: Performed at [facility], [place] [address], [place], [place] [address]  Protime-INR     Status: None   Collection Time: 06/23/18  6:03 PM  Result Value Ref Range   Prothrombin Time 11.7 11.4 - 15.2 seconds   INR 0.87     Comment: Performed at [facility], [place] [address] [place], [place] [address]  Type and screen [place]     Status: None   Collection Time: 06/23/18  6:03 PM  Result Value Ref Range   ABO/RH(D) O POS    Antibody Screen NEG    Sample Expiration      06/26/2018 Performed at [facility], [place] [address] [place], [place] [address]   Ethanol     Status: None   Collection Time: 06/23/18  6:03 PM  Result Value Ref Range   Alcohol, Ethyl (B) <10 <10 mg/dL    Comment: (NOTE) Lowest detectable limit for serum alcohol is 10 mg/dL. For medical purposes only. Performed at [facility], [place] [address], [place], [place] [address]   ABO/Rh     Status: None (Preliminary result)   Collection Time: 06/23/18  6:03 PM  Result Value Ref Range   ABO/RH(D)      Jenetta Downer POS Performed at [facility], [place] [address], [place], [place] [address]     Dg Chest 2  View  Result Date: 06/23/2018 CLINICAL DATA:  Trip and fall EXAM: CHEST - 2 VIEW COMPARISON:  09/03/2012 FINDINGS:  Cardiac shadow is stable. Lungs are well aerated bilaterally. Mild accentuation of the mediastinum and hila is noted related to patient rotation to the left. No focal infiltrate or effusion is seen. No acute bony abnormality is noted. IMPRESSION: No acute abnormality noted. Electronically Signed   By: Inez Catalina M.D.   On: 06/23/2018 18:56   Dg Pelvis 1-2 Views  Result Date: 06/23/2018 CLINICAL DATA:  Trip and fall this morning with pelvic pain, initial encounter EXAM: PELVIS - 1-2 VIEW COMPARISON:  None. FINDINGS: The pelvic ring is intact. There is a transverse fracture through the right femoral neck with some impaction at the fracture site. No other fractures are seen. IMPRESSION: Right femoral neck fracture with impaction at the fracture site. Electronically Signed   By: Inez Catalina M.D.   On: 06/23/2018 18:57   Ct Head Wo Contrast  Result Date: 06/23/2018 CLINICAL DATA:  Recent trip and fall with right femur fracture, headaches and neck pain EXAM: CT HEAD WITHOUT CONTRAST CT CERVICAL SPINE WITHOUT CONTRAST TECHNIQUE: Multidetector CT imaging of the head and cervical spine was performed following the standard protocol without intravenous contrast. Multiplanar CT image reconstructions of the cervical spine were also generated. COMPARISON:  None. FINDINGS: CT HEAD FINDINGS Brain: No evidence of acute infarction, hemorrhage, hydrocephalus, extra-axial collection or mass lesion/mass effect. Vascular: No hyperdense vessel or unexpected calcification. Skull: Normal. Negative for fracture or focal lesion. Sinuses/Orbits: No acute finding. Other: None. CT CERVICAL SPINE FINDINGS Alignment: Mild loss of the normal cervical lordosis is noted. Skull base and vertebrae: 7 cervical segments are well visualized. Mild facet hypertrophic changes and osteophytic changes are seen. Anterolisthesis of C3 on C4 is seen of a degenerative nature. No acute fracture or acute facet abnormality is noted. Disc space narrowing is  noted at C6-7 with associated osteophytes. Soft tissues and spinal canal: Mild vascular calcifications are noted. No soft tissue abnormality is seen. Upper chest: Visualized lung fields are within normal limits. Motion artifact limits the bony detail. Other: None IMPRESSION: CT of the head: No acute intracranial abnormality noted. CT of the cervical spine: Multilevel degenerative change without acute abnormality. Electronically Signed   By: Inez Catalina M.D.   On: 06/23/2018 19:03   Ct Cervical Spine Wo Contrast  Result Date: 06/23/2018 CLINICAL DATA:  Recent trip and fall with right femur fracture, headaches and neck pain EXAM: CT HEAD WITHOUT CONTRAST CT CERVICAL SPINE WITHOUT CONTRAST TECHNIQUE: Multidetector CT imaging of the head and cervical spine was performed following the standard protocol without intravenous contrast. Multiplanar CT image reconstructions of the cervical spine were also generated. COMPARISON:  None. FINDINGS: CT HEAD FINDINGS Brain: No evidence of acute infarction, hemorrhage, hydrocephalus, extra-axial collection or mass lesion/mass effect. Vascular: No hyperdense vessel or unexpected calcification. Skull: Normal. Negative for fracture or focal lesion. Sinuses/Orbits: No acute finding. Other: None. CT CERVICAL SPINE FINDINGS Alignment: Mild loss of the normal cervical lordosis is noted. Skull base and vertebrae: 7 cervical segments are well visualized. Mild facet hypertrophic changes and osteophytic changes are seen. Anterolisthesis of C3 on C4 is seen of a degenerative nature. No acute fracture or acute facet abnormality is noted. Disc space narrowing is noted at C6-7 with associated osteophytes. Soft tissues and spinal canal: Mild vascular calcifications are noted. No soft tissue abnormality is seen. Upper chest: Visualized lung fields are within normal limits. Motion artifact limits  the bony detail. Other: None IMPRESSION: CT of the head: No acute intracranial abnormality noted. CT  of the cervical spine: Multilevel degenerative change without acute abnormality. Electronically Signed   By: Inez Catalina M.D.   On: 06/23/2018 19:03   Dg Femur Min 2 Views Right  Result Date: 06/23/2018 CLINICAL DATA:  Recent trip and fall with right leg pain, initial encounter EXAM: RIGHT FEMUR 2 VIEWS COMPARISON:  None. FINDINGS: Right femoral neck fracture with impaction is again identified and stable. The remainder of the femur is within normal limits. No soft tissue changes are seen. IMPRESSION: Right femoral neck fracture with impaction at the fracture site. Electronically Signed   By: Inez Catalina M.D.   On: 06/23/2018 18:58    Review of Systems  All other systems reviewed and are negative.  Blood pressure (!) 151/123, pulse 89, temperature 98.2 F (36.8 C), temperature source Oral, resp. rate 18, height '5\' 8"'  (1.727 m), weight 77.1 kg, SpO2 100 %. Physical Exam  Constitutional: He is oriented to person, place, and time. He appears well-developed and well-nourished.  HENT:  Head: Atraumatic.  Eyes: EOM are normal.  Cardiovascular: Intact distal pulses.  Respiratory: Effort normal.  Musculoskeletal:  Tenderness over the right hip and pain with gentle logroll of the right leg.  Distally neurovascularly intact bilateral lower extremities.  Neurological: He is alert and oriented to person, place, and time.  Skin: Skin is warm and dry.  Psychiatric: He has a normal mood and affect.    Assessment/Plan: Right impacted femoral neck fracture He will benefit from percutaneous screw fixation to prevent fracture displacement and allow early ambulation.  We discussed the procedure and he is interested in proceeding.  Based on operating room availability tomorrow I am going to have my partner Dr. Radene Journey take over the care of this patient tomorrow morning and he will take him to the operating room around 11 AM.  The patient understands and is comfortable with this.  All questions were  welcomed and answered.  He should remain n.p.o. after midnight.  Isabella Stalling 06/23/2018, 9:42 PM

## 2018-06-23 NOTE — ED Provider Notes (Signed)
[facility] DEPT Provider Note   CSN: [id number] Arrival date & time: 06/23/18  1456     History   Chief Complaint Chief Complaint  Patient presents with  . Fall  . Hip Pain    HPI David [name] is a 62 y.o. male.  Pt presents to the ED today with right hip pain.  Pt said that he tripped over a pillow that was on the ground around 0500.  Pt was helped to the couch by his wife, but could not put any weight on his leg.  The pt said he took some sleeping medication to try to help the pain.  The pt denies hitting his head.     Past Medical History:  Diagnosis Date  . ADD (attention deficit disorder)   . Depression   . HTN (hypertension)     Patient Active Problem List   Diagnosis Date Noted  . Alcohol abuse with alcohol-induced mood disorder ([place]) 03/16/2018    Past Surgical History:  Procedure Laterality Date  . BACK SURGERY          Home Medications    Prior to Admission medications   Medication Sig Start Date End Date Taking? Authorizing Provider  cloNIDine (CATAPRES) 0.3 MG tablet Take 0.3 mg by mouth 2 (two) times daily.    Yes [provider]  FLUoxetine (PROZAC) 20 MG capsule Take 20 mg by mouth daily.    Yes [provider]  hydrOXYzine (ATARAX/VISTARIL) 10 MG tablet Take 10 mg by mouth 3 (three) times daily as needed for anxiety.    Yes [provider]  pregabalin (LYRICA) 150 MG capsule Take 150 mg by mouth 2 (two) times daily.    Yes [provider]  zolpidem (AMBIEN) 10 MG tablet Take 10 mg by mouth at bedtime as needed for sleep.    Yes [provider]  gabapentin (NEURONTIN) 300 MG capsule Take 1 capsule (300 mg total) by mouth 3 (three) times daily. Patient not taking: Reported on 06/23/2018 03/16/18   Patrecia Pour, NP  meloxicam (MOBIC) 15 MG tablet Take 1 tablet (15 mg total) by mouth daily. Patient not taking: Reported on 06/23/2018 03/05/18   Dickie La, MD    Family  History Family History  Problem Relation Age of Onset  . Arthritis Unknown   . Heart disease Unknown   . Cancer Unknown   . Hypertension Unknown     Social History Social History   Tobacco Use  . Smoking status: Current Every Day Smoker    Packs/day: 0.50    Years: 25.00    Pack years: 12.50  . Smokeless tobacco: Never Used  Substance Use Topics  . Alcohol use: Yes    Comment: daily  . Drug use: Yes    Types: Marijuana     Allergies   Codeine   Review of Systems Review of Systems  Musculoskeletal:       Right hip pain  All other systems reviewed and are negative.    Physical Exam Updated Vital Signs BP (!) 178/103 (BP Location: Right Arm)   Pulse 87   Temp 98.2 F (36.8 C) (Oral)   Resp 16   Ht 5\' 8"  (1.727 m)   Wt 77.1 kg   SpO2 100%   BMI 25.85 kg/m   Physical Exam  Constitutional: He is oriented to person, place, and time. He appears well-developed and well-nourished.  HENT:  Head: Normocephalic and atraumatic.  Right Ear: External  ear normal.  Left Ear: External ear normal.  Nose: Nose normal.  Mouth/Throat: Oropharynx is clear and moist.  Eyes: Pupils are equal, round, and reactive to light. Conjunctivae and EOM are normal.  Neck: Normal range of motion. Neck supple.  Cardiovascular: Normal rate, regular rhythm, normal heart sounds and intact distal pulses.  Pulmonary/Chest: Effort normal and breath sounds normal.  Abdominal: Soft. Bowel sounds are normal.  Musculoskeletal:       Right hip: He exhibits tenderness.  Neurological: He is alert and oriented to person, place, and time.  Pt is awake, somewhat drowsy.  He is moving all 4 extremities and following commands.  Skin: Skin is warm. Capillary refill takes less than 2 seconds.  Psychiatric: He has a normal mood and affect. His behavior is normal. Judgment and thought content normal.  Nursing note and vitals reviewed.    ED Treatments / Results  Labs (all labs ordered are listed, but  only abnormal results are displayed) Labs Reviewed  BASIC METABOLIC PANEL - Abnormal; Notable for the following components:      Result Value   Glucose, Bld 101 (*)    All other components within normal limits  CBC WITH DIFFERENTIAL/PLATELET - Abnormal; Notable for the following components:   WBC 14.2 (*)    Hemoglobin 10.4 (*)    HCT 35.5 (*)    MCV 77.5 (*)    MCH 22.7 (*)    MCHC 29.3 (*)    RDW 19.6 (*)    Platelets 429 (*)    Neutro Abs 11.2 (*)    Abs Immature Granulocytes 0.08 (*)    All other components within normal limits  URINALYSIS, ROUTINE W REFLEX MICROSCOPIC - Abnormal; Notable for the following components:   Color, Urine COLORLESS (*)    Specific Gravity, Urine 1.004 (*)    All other components within normal limits  RAPID URINE DRUG SCREEN, HOSP PERFORMED - Abnormal; Notable for the following components:   Cocaine POSITIVE (*)    Benzodiazepines POSITIVE (*)    All other components within normal limits  PROTIME-INR  ETHANOL  TYPE AND SCREEN    EKG EKG Interpretation  Date/Time:  Wednesday June 23 2018 15:10:53 EDT Ventricular Rate:  66 PR Interval:    QRS Duration: 97 QT Interval:  411 QTC Calculation: 431 R Axis:   80 Text Interpretation:  Sinus rhythm No significant change since last tracing Confirmed by Isla Pence [phone]) on 06/23/2018 5:55:03 PM   Radiology Dg Chest 2 View  Result Date: 06/23/2018 CLINICAL DATA:  Trip and fall EXAM: CHEST - 2 VIEW COMPARISON:  09/03/2012 FINDINGS: Cardiac shadow is stable. Lungs are well aerated bilaterally. Mild accentuation of the mediastinum and hila is noted related to patient rotation to the left. No focal infiltrate or effusion is seen. No acute bony abnormality is noted. IMPRESSION: No acute abnormality noted. Electronically Signed   By: Inez Catalina M.D.   On: 06/23/2018 18:56   Dg Pelvis 1-2 Views  Result Date: 06/23/2018 CLINICAL DATA:  Trip and fall this morning with pelvic pain, initial  encounter EXAM: PELVIS - 1-2 VIEW COMPARISON:  None. FINDINGS: The pelvic ring is intact. There is a transverse fracture through the right femoral neck with some impaction at the fracture site. No other fractures are seen. IMPRESSION: Right femoral neck fracture with impaction at the fracture site. Electronically Signed   By: Inez Catalina M.D.   On: 06/23/2018 18:57   Ct Head Wo Contrast  Result Date: 06/23/2018  CLINICAL DATA:  Recent trip and fall with right femur fracture, headaches and neck pain EXAM: CT HEAD WITHOUT CONTRAST CT CERVICAL SPINE WITHOUT CONTRAST TECHNIQUE: Multidetector CT imaging of the head and cervical spine was performed following the standard protocol without intravenous contrast. Multiplanar CT image reconstructions of the cervical spine were also generated. COMPARISON:  None. FINDINGS: CT HEAD FINDINGS Brain: No evidence of acute infarction, hemorrhage, hydrocephalus, extra-axial collection or mass lesion/mass effect. Vascular: No hyperdense vessel or unexpected calcification. Skull: Normal. Negative for fracture or focal lesion. Sinuses/Orbits: No acute finding. Other: None. CT CERVICAL SPINE FINDINGS Alignment: Mild loss of the normal cervical lordosis is noted. Skull base and vertebrae: 7 cervical segments are well visualized. Mild facet hypertrophic changes and osteophytic changes are seen. Anterolisthesis of C3 on C4 is seen of a degenerative nature. No acute fracture or acute facet abnormality is noted. Disc space narrowing is noted at C6-7 with associated osteophytes. Soft tissues and spinal canal: Mild vascular calcifications are noted. No soft tissue abnormality is seen. Upper chest: Visualized lung fields are within normal limits. Motion artifact limits the bony detail. Other: None IMPRESSION: CT of the head: No acute intracranial abnormality noted. CT of the cervical spine: Multilevel degenerative change without acute abnormality. Electronically Signed   By: Inez Catalina M.D.    On: 06/23/2018 19:03   Ct Cervical Spine Wo Contrast  Result Date: 06/23/2018 CLINICAL DATA:  Recent trip and fall with right femur fracture, headaches and neck pain EXAM: CT HEAD WITHOUT CONTRAST CT CERVICAL SPINE WITHOUT CONTRAST TECHNIQUE: Multidetector CT imaging of the head and cervical spine was performed following the standard protocol without intravenous contrast. Multiplanar CT image reconstructions of the cervical spine were also generated. COMPARISON:  None. FINDINGS: CT HEAD FINDINGS Brain: No evidence of acute infarction, hemorrhage, hydrocephalus, extra-axial collection or mass lesion/mass effect. Vascular: No hyperdense vessel or unexpected calcification. Skull: Normal. Negative for fracture or focal lesion. Sinuses/Orbits: No acute finding. Other: None. CT CERVICAL SPINE FINDINGS Alignment: Mild loss of the normal cervical lordosis is noted. Skull base and vertebrae: 7 cervical segments are well visualized. Mild facet hypertrophic changes and osteophytic changes are seen. Anterolisthesis of C3 on C4 is seen of a degenerative nature. No acute fracture or acute facet abnormality is noted. Disc space narrowing is noted at C6-7 with associated osteophytes. Soft tissues and spinal canal: Mild vascular calcifications are noted. No soft tissue abnormality is seen. Upper chest: Visualized lung fields are within normal limits. Motion artifact limits the bony detail. Other: None IMPRESSION: CT of the head: No acute intracranial abnormality noted. CT of the cervical spine: Multilevel degenerative change without acute abnormality. Electronically Signed   By: Inez Catalina M.D.   On: 06/23/2018 19:03   Dg Femur Min 2 Views Right  Result Date: 06/23/2018 CLINICAL DATA:  Recent trip and fall with right leg pain, initial encounter EXAM: RIGHT FEMUR 2 VIEWS COMPARISON:  None. FINDINGS: Right femoral neck fracture with impaction is again identified and stable. The remainder of the femur is within normal  limits. No soft tissue changes are seen. IMPRESSION: Right femoral neck fracture with impaction at the fracture site. Electronically Signed   By: Inez Catalina M.D.   On: 06/23/2018 18:58    Procedures Procedures (including critical care time)  Medications Ordered in ED Medications  sodium chloride 0.9 % bolus 1,000 mL (0 mLs Intravenous Stopped 06/23/18 1942)    And  0.9 %  sodium chloride infusion ( Intravenous New Bag/Given 06/23/18 1936)  fentaNYL (SUBLIMAZE) injection 50 mcg (50 mcg Intravenous Given 06/23/18 1933)     Initial Impression / Assessment and Plan / ED Course  I have reviewed the triage vital signs and the nursing notes.  Pertinent labs & imaging results that were available during my care of the patient were reviewed by me and considered in my medical decision making (see chart for details).    Pt d/w Dr. Tamera Punt (ortho) who requests pt be admitted to [facility] by the hospitalists.  NPO after midnight.  Pt d/w Dr. Myna Hidalgo (triad) who will admit.  Final Clinical Impressions(s) / ED Diagnoses   Final diagnoses:  Closed fracture of neck of right femur, initial encounter [facility])  Polysubstance abuse [facility])    ED Discharge Orders    None       Isla Pence, MD 06/23/18 1955

## 2018-06-23 NOTE — H&P (Signed)
History and Physical    David [name] LEX:[id number] DOB: [birth year] DOA: 06/23/2018  PCP: [name], MD   Patient coming from: Home   Chief Complaint: Fall with right hip pain   HPI: David [name] is a 62 y.o. male with medical history significant for alcohol dependence, substance abuse, depression with anxiety, and hypertension, now presenting to the emergency department for evaluation of severe right hip pain after a fall at home.  Patient reports that he been in his usual state of health with no recent fevers, chills, cough, shortness of breath, chest pain, or other illness.  He was up early this morning, walking in his house in the dark, reports tripping over a pillow that was on the ground, and landing on his right side.  He experienced immediate and severe pain at the right hip and was unable to bear weight.  Took some pain medication and sleeping aid at home, rested, but continued to have severe pain and inability to bear weight, prompting him to present to the ED today.  Denies any history of heart disease and reports that he is able to ascend a flight of stairs without any shortness of breath or angina.  Denies illicit substance use.  Acknowledges drinking alcohol every day, sometimes in excess.  Denies history of significant withdrawal symptoms or seizure.  ED Course: Upon arrival to the ED, patient is found to be afebrile, saturating well on room air, and hypertensive to 190/100.  EKG features a sinus rhythm and chest x-ray is negative for acute cardiopulmonary disease.  Noncontrast head CT is negative for acute intracranial abnormality and there is no acute pathology on CT cervical spine.  Radiographs demonstrate impacted right femoral neck fracture.  Chemistry panel is unremarkable and CBC is notable for leukocytosis to 14,200, chronic thrombocytosis with platelets 427,000, and a stable microcytic anemia with hemoglobin 10.4.  Patient was given a liter of normal saline and  fentanyl in the ED.  Orthopedic surgery was consulted and recommended medical admission for further evaluation and management of right hip fracture.  Review of Systems:  All other systems reviewed and apart from HPI, are negative.  Past Medical History:  Diagnosis Date  . ADD (attention deficit disorder)   . Depression   . HTN (hypertension)     Past Surgical History:  Procedure Laterality Date  . BACK SURGERY       reports that he has been smoking. He has a 12.50 pack-year smoking history. He has never used smokeless tobacco. He reports that he drinks alcohol. He reports that he has current or past drug history. Drug: Marijuana.  Allergies  Allergen Reactions  . Codeine     Family History  Problem Relation Age of Onset  . Arthritis Unknown   . Heart disease Unknown   . Cancer Unknown   . Hypertension Unknown      Prior to Admission medications   Medication Sig Start Date End Date Taking? Authorizing Provider  cloNIDine (CATAPRES) 0.3 MG tablet Take 0.3 mg by mouth 2 (two) times daily.    Yes [provider]  FLUoxetine (PROZAC) 20 MG capsule Take 20 mg by mouth daily.    Yes [provider]  hydrOXYzine (ATARAX/VISTARIL) 10 MG tablet Take 10 mg by mouth 3 (three) times daily as needed for anxiety.    Yes [provider]  pregabalin (LYRICA) 150 MG capsule Take 150 mg by mouth 2 (two) times daily.    Yes [provider]  zolpidem (AMBIEN) 10 MG tablet Take 10 mg by mouth at bedtime as needed for sleep.    Yes [provider]    Physical Exam: Vitals:   06/23/18 1600 06/23/18 1715 06/23/18 1716 06/23/18 1924  BP: (!) 187/98 (!) 196/109 (!) 196/109 (!) 178/103  Pulse: 67 80 77 87  Resp: 16 20 15 16   Temp:      TempSrc:      SpO2: 100% 98% 96% 100%  Weight:      Height:         Constitutional: NAD, calm  Eyes: PERTLA, lids and conjunctivae normal ENMT: Mucous membranes are moist. Posterior pharynx clear of any exudate  or lesions.   Neck: normal, supple, no masses, no thyromegaly Respiratory: clear to auscultation bilaterally, no wheezing, no crackles. Normal respiratory effort.   Cardiovascular: S1 & S2 heard, regular rate and rhythm. No extremity edema.   Abdomen: No distension, soft, non-tender. Bowel sounds active.  Musculoskeletal: no clubbing / cyanosis. Tender at right hip; neurovascularly intact distally.  Skin: no significant rashes, lesions, ulcers. Warm, dry, well-perfused. Neurologic: No facial asymmetry. Sensation intact. Strength 5/5 in all 4 limbs.  Psychiatric: Sleeping, easily woken and oriented x 3. Pleasant and cooperative.    Labs on Admission: I have personally reviewed following labs and imaging studies  CBC: Recent Labs  Lab 06/23/18 1803  WBC 14.2*  NEUTROABS 11.2*  HGB 10.4*  HCT 35.5*  MCV 77.5*  PLT 557*   Basic Metabolic Panel: Recent Labs  Lab 06/23/18 1803  NA 142  K 3.8  CL 106  CO2 27  GLUCOSE 101*  BUN 9  CREATININE 0.90  CALCIUM 9.2   GFR: Estimated Creatinine Clearance: 82.3 mL/min (by C-G formula based on SCr of 0.9 mg/dL). Liver Function Tests: No results for input(s): AST, ALT, ALKPHOS, BILITOT, PROT, ALBUMIN in the last 168 hours. No results for input(s): LIPASE, AMYLASE in the last 168 hours. No results for input(s): AMMONIA in the last 168 hours. Coagulation Profile: Recent Labs  Lab 06/23/18 1803  INR 0.87   Cardiac Enzymes: No results for input(s): CKTOTAL, CKMB, CKMBINDEX, TROPONINI in the last 168 hours. BNP (last 3 results) No results for input(s): PROBNP in the last 8760 hours. HbA1C: No results for input(s): HGBA1C in the last 72 hours. CBG: No results for input(s): GLUCAP in the last 168 hours. Lipid Profile: No results for input(s): CHOL, HDL, LDLCALC, TRIG, CHOLHDL, LDLDIRECT in the last 72 hours. Thyroid Function Tests: No results for input(s): TSH, T4TOTAL, FREET4, T3FREE, THYROIDAB in the last 72 hours. Anemia  Panel: No results for input(s): VITAMINB12, FOLATE, FERRITIN, TIBC, IRON, RETICCTPCT in the last 72 hours. Urine analysis:    Component Value Date/Time   COLORURINE COLORLESS (A) 06/23/2018 1718   APPEARANCEUR CLEAR 06/23/2018 1718   LABSPEC 1.004 (L) 06/23/2018 1718   PHURINE 6.0 06/23/2018 1718   GLUCOSEU NEGATIVE 06/23/2018 1718   HGBUR NEGATIVE 06/23/2018 1718   BILIRUBINUR NEGATIVE 06/23/2018 1718   KETONESUR NEGATIVE 06/23/2018 1718   PROTEINUR NEGATIVE 06/23/2018 1718   UROBILINOGEN 0.2 04/14/2009 0446   NITRITE NEGATIVE 06/23/2018 1718   LEUKOCYTESUR NEGATIVE 06/23/2018 1718   Sepsis Labs: @LABRCNTIP (procalcitonin:4,lacticidven:4) )No results found for this or any previous visit (from the past 240 hour(s)).   Radiological Exams on Admission: Dg Chest 2 View  Result Date: 06/23/2018 CLINICAL DATA:  Trip and fall EXAM: CHEST - 2 VIEW COMPARISON:  09/03/2012 FINDINGS: Cardiac shadow is stable. Lungs are well aerated bilaterally. Mild accentuation  of the mediastinum and hila is noted related to patient rotation to the left. No focal infiltrate or effusion is seen. No acute bony abnormality is noted. IMPRESSION: No acute abnormality noted. Electronically Signed   By: Inez Catalina M.D.   On: 06/23/2018 18:56   Dg Pelvis 1-2 Views  Result Date: 06/23/2018 CLINICAL DATA:  Trip and fall this morning with pelvic pain, initial encounter EXAM: PELVIS - 1-2 VIEW COMPARISON:  None. FINDINGS: The pelvic ring is intact. There is a transverse fracture through the right femoral neck with some impaction at the fracture site. No other fractures are seen. IMPRESSION: Right femoral neck fracture with impaction at the fracture site. Electronically Signed   By: Inez Catalina M.D.   On: 06/23/2018 18:57   Ct Head Wo Contrast  Result Date: 06/23/2018 CLINICAL DATA:  Recent trip and fall with right femur fracture, headaches and neck pain EXAM: CT HEAD WITHOUT CONTRAST CT CERVICAL SPINE WITHOUT CONTRAST  TECHNIQUE: Multidetector CT imaging of the head and cervical spine was performed following the standard protocol without intravenous contrast. Multiplanar CT image reconstructions of the cervical spine were also generated. COMPARISON:  None. FINDINGS: CT HEAD FINDINGS Brain: No evidence of acute infarction, hemorrhage, hydrocephalus, extra-axial collection or mass lesion/mass effect. Vascular: No hyperdense vessel or unexpected calcification. Skull: Normal. Negative for fracture or focal lesion. Sinuses/Orbits: No acute finding. Other: None. CT CERVICAL SPINE FINDINGS Alignment: Mild loss of the normal cervical lordosis is noted. Skull base and vertebrae: 7 cervical segments are well visualized. Mild facet hypertrophic changes and osteophytic changes are seen. Anterolisthesis of C3 on C4 is seen of a degenerative nature. No acute fracture or acute facet abnormality is noted. Disc space narrowing is noted at C6-7 with associated osteophytes. Soft tissues and spinal canal: Mild vascular calcifications are noted. No soft tissue abnormality is seen. Upper chest: Visualized lung fields are within normal limits. Motion artifact limits the bony detail. Other: None IMPRESSION: CT of the head: No acute intracranial abnormality noted. CT of the cervical spine: Multilevel degenerative change without acute abnormality. Electronically Signed   By: Inez Catalina M.D.   On: 06/23/2018 19:03   Ct Cervical Spine Wo Contrast  Result Date: 06/23/2018 CLINICAL DATA:  Recent trip and fall with right femur fracture, headaches and neck pain EXAM: CT HEAD WITHOUT CONTRAST CT CERVICAL SPINE WITHOUT CONTRAST TECHNIQUE: Multidetector CT imaging of the head and cervical spine was performed following the standard protocol without intravenous contrast. Multiplanar CT image reconstructions of the cervical spine were also generated. COMPARISON:  None. FINDINGS: CT HEAD FINDINGS Brain: No evidence of acute infarction, hemorrhage, hydrocephalus,  extra-axial collection or mass lesion/mass effect. Vascular: No hyperdense vessel or unexpected calcification. Skull: Normal. Negative for fracture or focal lesion. Sinuses/Orbits: No acute finding. Other: None. CT CERVICAL SPINE FINDINGS Alignment: Mild loss of the normal cervical lordosis is noted. Skull base and vertebrae: 7 cervical segments are well visualized. Mild facet hypertrophic changes and osteophytic changes are seen. Anterolisthesis of C3 on C4 is seen of a degenerative nature. No acute fracture or acute facet abnormality is noted. Disc space narrowing is noted at C6-7 with associated osteophytes. Soft tissues and spinal canal: Mild vascular calcifications are noted. No soft tissue abnormality is seen. Upper chest: Visualized lung fields are within normal limits. Motion artifact limits the bony detail. Other: None IMPRESSION: CT of the head: No acute intracranial abnormality noted. CT of the cervical spine: Multilevel degenerative change without acute abnormality. Electronically Signed   By:  Inez Catalina M.D.   On: 06/23/2018 19:03   Dg Femur Min 2 Views Right  Result Date: 06/23/2018 CLINICAL DATA:  Recent trip and fall with right leg pain, initial encounter EXAM: RIGHT FEMUR 2 VIEWS COMPARISON:  None. FINDINGS: Right femoral neck fracture with impaction is again identified and stable. The remainder of the femur is within normal limits. No soft tissue changes are seen. IMPRESSION: Right femoral neck fracture with impaction at the fracture site. Electronically Signed   By: Inez Catalina M.D.   On: 06/23/2018 18:58    EKG: Independently reviewed. Sinus rhythm.   Assessment/Plan   1. Right hip fracture - Presents with severe right hip pain and inability to bear weight after a fall at home  - Radiographs reveal right femoral neck fracture  - Orthopedic surgery is consulting and much appreciated  - Based on the available data, Mr. Boeve presents an estimated 0.6% risk of perioperative MI or  cardiac arrest per Melburn Hake al  - Continue pain-control, IVF hydration, NPO after midnight   2. Alcohol dependence  - No signs of withdrawal on admission  - Monitor with CIWA and prn Ativan, supplement b-vitamins    3. Hypertension with hypertensive urgency  - BP 190/100 range in ED  - Pain-control, continue clonidine, use hydralazine IVP's as needed    4. Depression with anxiety  - Continue Prozac, Lyrica, as-needed Vistaril    5. Microcytic anemia  - Hgb is 10.4 on admission, improved from July and with no active bleeding  - Type and screen, check anemia panel   6. Leukocytosis - Likely reactive, no infectious s/s  - Culture if febrile    DVT prophylaxis: SCD's  Code Status: Full  Family Communication: Discussed with patient  Consults called: Orthopedic surgery  Admission status: Inpatient     Vianne Bulls, MD [facility] Pager [phone]  If 7PM-7AM, please contact night-coverage [email or web address] Password [email or web address]  06/23/2018, 8:15 PM

## 2018-06-23 NOTE — ED Triage Notes (Signed)
Patient BIB [facility] from home pt tripped and fell around 5am on hardwood floor. Pt helped to couch by S/O, but pain increased throughout the day. Pt unable to get up and walk to bathroom. Pt took sleeping medication to try and "sleep off the pain". Pt is drowsy at this time but arousable and oriented. Pt c/o rt side and hip pain. Bruising noted to rt scapula, abrasion to rt elbow. Pt has hx of hip injury. Denies hitting head or LOC.

## 2018-06-24 ENCOUNTER — Encounter (HOSPITAL_COMMUNITY)
Admission: EM | Disposition: A | Discharge status: Home Health | Payer: Self-pay | Source: Home / Self Care | Attending: Internal Medicine

## 2018-06-24 ENCOUNTER — Inpatient Hospital Stay (HOSPITAL_COMMUNITY): Payer: PPO

## 2018-06-24 ENCOUNTER — Inpatient Hospital Stay (HOSPITAL_COMMUNITY): Payer: PPO | Admitting: Anesthesiology

## 2018-06-24 ENCOUNTER — Encounter (HOSPITAL_COMMUNITY): Payer: Self-pay | Admitting: Anesthesiology

## 2018-06-24 DIAGNOSIS — F1910 Other psychoactive substance abuse, uncomplicated: Secondary | ICD-10-CM

## 2018-06-24 DIAGNOSIS — F1014 Alcohol abuse with alcohol-induced mood disorder: Secondary | ICD-10-CM

## 2018-06-24 DIAGNOSIS — F418 Other specified anxiety disorders: Secondary | ICD-10-CM

## 2018-06-24 DIAGNOSIS — I160 Hypertensive urgency: Secondary | ICD-10-CM

## 2018-06-24 DIAGNOSIS — S72001A Fracture of unspecified part of neck of right femur, initial encounter for closed fracture: Principal | ICD-10-CM

## 2018-06-24 DIAGNOSIS — I10 Essential (primary) hypertension: Secondary | ICD-10-CM

## 2018-06-24 DIAGNOSIS — D509 Iron deficiency anemia, unspecified: Secondary | ICD-10-CM

## 2018-06-24 HISTORY — PX: HIP PINNING,CANNULATED: SHX1758

## 2018-06-24 LAB — BASIC METABOLIC PANEL
ANION GAP: 5 (ref 5–15)
BUN: 10 mg/dL (ref 8–23)
CALCIUM: 8.3 mg/dL — AB (ref 8.9–10.3)
CO2: 26 mmol/L (ref 22–32)
Chloride: 109 mmol/L (ref 98–111)
Creatinine, Ser: 0.82 mg/dL (ref 0.61–1.24)
GFR calc Af Amer: >60 mL/min (ref >60)
GFR calc non Af Amer: >60 mL/min (ref >60)
GLUCOSE: 101 mg/dL — AB (ref 70–99)
Potassium: 4 mmol/L (ref 3.5–5.1)
Sodium: 140 mmol/L (ref 135–145)

## 2018-06-24 LAB — CBC
HCT: 29.8 % — ABNORMAL LOW (ref 39.0–52.0)
Hemoglobin: 8.7 g/dL — ABNORMAL LOW (ref 13.0–17.0)
MCH: 22.7 pg — ABNORMAL LOW (ref 26.0–34.0)
MCHC: 29.2 g/dL — ABNORMAL LOW (ref 30.0–36.0)
MCV: 77.6 fL — AB (ref 80.0–100.0)
NRBC: 0 % (ref 0.0–0.2)
PLATELETS: 345 10*3/uL (ref 150–400)
RBC: 3.84 MIL/uL — AB (ref 4.22–5.81)
RDW: 19.4 % — AB (ref 11.5–15.5)
WBC: 9.8 10*3/uL (ref 4.0–10.5)

## 2018-06-24 LAB — IRON AND TIBC
IRON: 84 ug/dL (ref 45–182)
Saturation Ratios: 21 % (ref 17.9–39.5)
TIBC: 396 ug/dL (ref 250–450)
UIBC: 312 ug/dL

## 2018-06-24 LAB — HIV ANTIBODY (ROUTINE TESTING W REFLEX): HIV Screen 4th Generation wRfx: NONREACTIVE

## 2018-06-24 LAB — SURGICAL PCR SCREEN
MRSA, PCR: POSITIVE — AB
STAPHYLOCOCCUS AUREUS: POSITIVE — AB

## 2018-06-24 LAB — FERRITIN: FERRITIN: 7 ng/mL — AB (ref 24–336)

## 2018-06-24 LAB — RETICULOCYTES
IMMATURE RETIC FRACT: 10.8 % (ref 2.3–15.9)
RBC.: 3.84 MIL/uL — ABNORMAL LOW (ref 4.22–5.81)
Retic Count, Absolute: 56.4 10*3/uL (ref 19.0–186.0)
Retic Ct Pct: 1.5 % (ref 0.4–3.1)

## 2018-06-24 LAB — FOLATE: FOLATE: 7.7 ng/mL (ref >5.9)

## 2018-06-24 LAB — VITAMIN B12: VITAMIN B 12: 132 pg/mL — AB (ref 180–914)

## 2018-06-24 SURGERY — FIXATION, FEMUR, NECK, PERCUTANEOUS, USING SCREW
Anesthesia: General | Laterality: Right

## 2018-06-24 MED ORDER — DEXAMETHASONE SODIUM PHOSPHATE 10 MG/ML IJ SOLN
INTRAMUSCULAR | Status: AC
  Filled 2018-06-24: qty 1

## 2018-06-24 MED ORDER — CEFAZOLIN SODIUM-DEXTROSE 2-4 GM/100ML-% IV SOLN
2.0000 g | INTRAVENOUS | Status: AC
  Administered 2018-06-24: 2 g via INTRAVENOUS
  Filled 2018-06-24: qty 100

## 2018-06-24 MED ORDER — PROPOFOL 10 MG/ML IV BOLUS
INTRAVENOUS | Status: DC | PRN
  Administered 2018-06-24: 100 mg via INTRAVENOUS

## 2018-06-24 MED ORDER — FENTANYL CITRATE (PF) 250 MCG/5ML IJ SOLN
INTRAMUSCULAR | Status: AC
  Filled 2018-06-24: qty 5

## 2018-06-24 MED ORDER — SODIUM CHLORIDE 0.9 % IV SOLN
INTRAVENOUS | Status: AC
  Administered 2018-06-24: 14:00:00 via INTRAVENOUS

## 2018-06-24 MED ORDER — LACTATED RINGERS IV SOLN
INTRAVENOUS | Status: DC | PRN
  Administered 2018-06-24 (×2): via INTRAVENOUS

## 2018-06-24 MED ORDER — PHENYLEPHRINE 40 MCG/ML (10ML) SYRINGE FOR IV PUSH (FOR BLOOD PRESSURE SUPPORT)
PREFILLED_SYRINGE | INTRAVENOUS | Status: AC
  Filled 2018-06-24: qty 10

## 2018-06-24 MED ORDER — ONDANSETRON HCL 4 MG/2ML IJ SOLN
INTRAMUSCULAR | Status: DC | PRN
  Administered 2018-06-24: 4 mg via INTRAVENOUS

## 2018-06-24 MED ORDER — ACETAMINOPHEN 10 MG/ML IV SOLN: 1000.0000 mg | Freq: Once | INTRAVENOUS | Status: DC | PRN

## 2018-06-24 MED ORDER — OXYCODONE HCL 5 MG/5ML PO SOLN: 5.0000 mg | Freq: Once | ORAL | Status: DC | PRN

## 2018-06-24 MED ORDER — CHLORHEXIDINE GLUCONATE 4 % EX LIQD
60.0000 mL | Freq: Once | CUTANEOUS | Status: AC
  Administered 2018-06-24: 4 via TOPICAL

## 2018-06-24 MED ORDER — VITAMIN B-12 1000 MCG PO TABS
500.0000 ug | ORAL_TABLET | Freq: Every day | ORAL | Status: DC
  Administered 2018-06-25 – 2018-06-26 (×2): 500 ug via ORAL
  Filled 2018-06-24 (×2): qty 1

## 2018-06-24 MED ORDER — SUGAMMADEX SODIUM 200 MG/2ML IV SOLN
INTRAVENOUS | Status: AC
  Filled 2018-06-24: qty 2

## 2018-06-24 MED ORDER — POVIDONE-IODINE 10 % EX SWAB
2.0000 "application " | Freq: Once | CUTANEOUS | Status: AC
  Administered 2018-06-24: 2 via TOPICAL

## 2018-06-24 MED ORDER — PROPOFOL 10 MG/ML IV BOLUS
INTRAVENOUS | Status: AC
  Filled 2018-06-24: qty 20

## 2018-06-24 MED ORDER — SODIUM CHLORIDE 0.9 % IV SOLN
INTRAVENOUS | Status: DC | PRN
  Administered 2018-06-24: 25 ug/min via INTRAVENOUS

## 2018-06-24 MED ORDER — CEFAZOLIN SODIUM-DEXTROSE 2-4 GM/100ML-% IV SOLN
2.0000 g | Freq: Three times a day (TID) | INTRAVENOUS | Status: AC
  Administered 2018-06-25 (×3): 2 g via INTRAVENOUS
  Filled 2018-06-24 (×3): qty 100

## 2018-06-24 MED ORDER — DEXAMETHASONE SODIUM PHOSPHATE 10 MG/ML IJ SOLN
INTRAMUSCULAR | Status: DC | PRN
  Administered 2018-06-24: 10 mg via INTRAVENOUS

## 2018-06-24 MED ORDER — MUPIROCIN 2 % EX OINT: 1.0000 "application " | TOPICAL_OINTMENT | Freq: Two times a day (BID) | CUTANEOUS | Status: DC

## 2018-06-24 MED ORDER — FENTANYL CITRATE (PF) 100 MCG/2ML IJ SOLN
INTRAMUSCULAR | Status: DC | PRN
  Administered 2018-06-24: 50 ug via INTRAVENOUS

## 2018-06-24 MED ORDER — LIDOCAINE 2% (20 MG/ML) 5 ML SYRINGE
INTRAMUSCULAR | Status: DC | PRN
  Administered 2018-06-24: 40 mg via INTRAVENOUS

## 2018-06-24 MED ORDER — SUGAMMADEX SODIUM 200 MG/2ML IV SOLN
INTRAVENOUS | Status: DC | PRN
  Administered 2018-06-24: 200 mg via INTRAVENOUS

## 2018-06-24 MED ORDER — PHENYLEPHRINE 40 MCG/ML (10ML) SYRINGE FOR IV PUSH (FOR BLOOD PRESSURE SUPPORT)
PREFILLED_SYRINGE | INTRAVENOUS | Status: DC | PRN
  Administered 2018-06-24: 40 ug via INTRAVENOUS

## 2018-06-24 MED ORDER — CHLORHEXIDINE GLUCONATE CLOTH 2 % EX PADS
6.0000 | MEDICATED_PAD | Freq: Every day | CUTANEOUS | Status: DC
  Administered 2018-06-25: 6 via TOPICAL

## 2018-06-24 MED ORDER — EPHEDRINE SULFATE-NACL 50-0.9 MG/10ML-% IV SOSY
PREFILLED_SYRINGE | INTRAVENOUS | Status: DC | PRN
  Administered 2018-06-24: 7.5 mg via INTRAVENOUS

## 2018-06-24 MED ORDER — LACTATED RINGERS IV SOLN
INTRAVENOUS | Status: DC
  Administered 2018-06-24: 17:00:00 via INTRAVENOUS

## 2018-06-24 MED ORDER — ROCURONIUM BROMIDE 10 MG/ML (PF) SYRINGE
PREFILLED_SYRINGE | INTRAVENOUS | Status: DC | PRN
  Administered 2018-06-24: 50 mg via INTRAVENOUS

## 2018-06-24 MED ORDER — FENTANYL CITRATE (PF) 100 MCG/2ML IJ SOLN: 25.0000 ug | INTRAMUSCULAR | Status: DC | PRN

## 2018-06-24 MED ORDER — MUPIROCIN 2 % EX OINT
1.0000 "application " | TOPICAL_OINTMENT | Freq: Two times a day (BID) | CUTANEOUS | Status: DC
  Administered 2018-06-24 – 2018-06-26 (×5): 1 via NASAL
  Filled 2018-06-24 (×3): qty 22

## 2018-06-24 MED ORDER — FENTANYL CITRATE (PF) 100 MCG/2ML IJ SOLN
INTRAMUSCULAR | Status: AC
  Filled 2018-06-24: qty 2

## 2018-06-24 MED ORDER — CHLORHEXIDINE GLUCONATE CLOTH 2 % EX PADS: 6.0000 | MEDICATED_PAD | Freq: Every day | CUTANEOUS | Status: DC

## 2018-06-24 MED ORDER — CYANOCOBALAMIN 1000 MCG/ML IJ SOLN
1000.0000 ug | Freq: Once | INTRAMUSCULAR | Status: AC
  Administered 2018-06-24: 1000 ug via INTRAMUSCULAR
  Filled 2018-06-24: qty 1

## 2018-06-24 MED ORDER — ONDANSETRON HCL 4 MG/2ML IJ SOLN: 4.0000 mg | Freq: Once | INTRAMUSCULAR | Status: DC | PRN

## 2018-06-24 MED ORDER — ONDANSETRON HCL 4 MG/2ML IJ SOLN
INTRAMUSCULAR | Status: AC
  Filled 2018-06-24: qty 2

## 2018-06-24 MED ORDER — OXYCODONE HCL 5 MG PO TABS: 5.0000 mg | ORAL_TABLET | Freq: Once | ORAL | Status: DC | PRN

## 2018-06-24 MED ORDER — EPHEDRINE 5 MG/ML INJ
INTRAVENOUS | Status: AC
  Filled 2018-06-24: qty 10

## 2018-06-24 SURGICAL SUPPLY — 40 items
BAG ZIPLOCK 12X15 (MISCELLANEOUS) IMPLANT
BIT DRILL CANN LRG QC 5X300 (BIT) ×3 IMPLANT
BNDG GAUZE ELAST 4 BULKY (GAUZE/BANDAGES/DRESSINGS) ×3 IMPLANT
CHLORAPREP W/TINT 26ML (MISCELLANEOUS) ×3 IMPLANT
CLOSURE WOUND 1/2 X4 (GAUZE/BANDAGES/DRESSINGS) ×1
COVER SURGICAL LIGHT HANDLE (MISCELLANEOUS) ×3 IMPLANT
COVER WAND RF STERILE (DRAPES) IMPLANT
DRAPE C-ARMOR (DRAPES) ×3 IMPLANT
DRAPE ORTHO SPLIT 77X108 STRL (DRAPES)
DRAPE SHEET LG 3/4 BI-LAMINATE (DRAPES) IMPLANT
DRAPE STERI IOBAN 125X83 (DRAPES) ×3 IMPLANT
DRAPE SURG ORHT 6 SPLT 77X108 (DRAPES) IMPLANT
DRSG MEPILEX BORDER 4X4 (GAUZE/BANDAGES/DRESSINGS) ×3 IMPLANT
DRSG MEPILEX BORDER 4X8 (GAUZE/BANDAGES/DRESSINGS) ×3 IMPLANT
DRSG PAD ABDOMINAL 8X10 ST (GAUZE/BANDAGES/DRESSINGS) IMPLANT
DURAPREP 26ML APPLICATOR (WOUND CARE) IMPLANT
ELECT REM PT RETURN 15FT ADLT (MISCELLANEOUS) ×3 IMPLANT
GLOVE BIO SURGEON STRL SZ7.5 (GLOVE) IMPLANT
GLOVE BIOGEL PI IND STRL 8 (GLOVE) ×8 IMPLANT
GLOVE BIOGEL PI INDICATOR 8 (GLOVE) ×16
GOWN STRL REUS W/TWL XL LVL3 (GOWN DISPOSABLE) ×9 IMPLANT
GUIDEWIRE THREADED 2.8 (WIRE) ×9 IMPLANT
NS IRRIG 1000ML POUR BTL (IV SOLUTION) ×3 IMPLANT
PACK GENERAL/GYN (CUSTOM PROCEDURE TRAY) ×3 IMPLANT
PAD CAST 4YDX4 CTTN HI CHSV (CAST SUPPLIES) IMPLANT
PADDING CAST COTTON 4X4 STRL (CAST SUPPLIES)
PADDING CAST COTTON 6X4 STRL (CAST SUPPLIES) IMPLANT
POSITIONER SURGICAL ARM (MISCELLANEOUS) ×3 IMPLANT
SCREW CANN 16 THRD/85 6.5 (Screw) ×6 IMPLANT
SCREW CANN 16 THRD/90 7.3 (Screw) ×6 IMPLANT
SCREW CANN 6.5X80MM (Screw) ×3 IMPLANT
SCREW LOCK (Screw) ×6 IMPLANT
SCREW LOCK FT 75X4.5XSLD ST NS (Screw) ×3 IMPLANT
STRIP CLOSURE SKIN 1/2X4 (GAUZE/BANDAGES/DRESSINGS) ×2 IMPLANT
SUT MNCRL AB 3-0 PS2 18 (SUTURE) IMPLANT
SUT MNCRL AB 4-0 PS2 18 (SUTURE) ×3 IMPLANT
SUT VIC AB 1 CT1 36 (SUTURE) IMPLANT
SUT VIC AB 2-0 CT1 27 (SUTURE) ×2
SUT VIC AB 2-0 CT1 TAPERPNT 27 (SUTURE) ×1 IMPLANT
TRAY FOLEY MTR SLVR 16FR STAT (SET/KITS/TRAYS/PACK) IMPLANT

## 2018-06-24 NOTE — Anesthesia Procedure Notes (Signed)
Procedure Name: Intubation Date/Time: 06/24/2018 5:53 PM Performed by: Cynda Familia, CRNA Pre-anesthesia Checklist: Patient identified, Emergency Drugs available, Suction available and Patient being monitored Patient Re-evaluated:Patient Re-evaluated prior to induction Oxygen Delivery Method: Circle System Utilized Preoxygenation: Pre-oxygenation with 100% oxygen Induction Type: IV induction Ventilation: Mask ventilation without difficulty Laryngoscope Size: Glidescope Grade View: Grade I Tube type: Oral Number of attempts: 1 Airway Equipment and Method: Stylet Placement Confirmation: ETT inserted through vocal cords under direct vision,  positive ETCO2 and breath sounds checked- equal and bilateral Secured at: 22 cm Tube secured with: Tape Dental Injury: Teeth and Oropharynx as per pre-operative assessment  Comments: Smooth IV induction Glennon Mac-- intubation via Glidescope AM CRNA-- teeth and mouth as preop-- many missing, broken and chipped teeth prior to larygngoscopy-- bilat BS [employer]

## 2018-06-24 NOTE — Op Note (Signed)
DEQUINCY BORN male 62 y.o. 06/24/2018  PreOperative Diagnosis: Right valgus impacted femoral neck fracture  PostOperative Diagnosis: Same   Procedure(s) and Anesthesia Type:    * RIGHT CANNULATED HIP PINNING - General  Surgeon: Erle Crocker   Assistants: None  Anesthesia: General endotracheal anesthesia   Findings: Right valgus impacted femoral neck fracture  Implants: Synthes 7.3 mm partially-threaded cannulated screws, Synthes 6.5 mm partially-threaded cannulated screw  Indications:62 y.o. male fell and sustained a right valgus impacted femoral neck fracture.  He was seen in emergency department and admitted to the medical service for surgical optimization.  Discussion was had with the patient regarding risks, benefits and alternatives of surgery he wished to proceed.  Procedure Detail: The patient was marked in the preoperative holding area by myself and the patient to the right lower extremity which was the operative extremity.  The consent was signed and dated by myself and the patient.  The patient was taken to the surgical suite and placed supine on a traction table.  General anesthesia was induced without difficulty.  Antibiotic was given.  The right extremity was placed in the traction boot and the left extremity was placed in a well leg holder.  Prior to prep appropriate x-rays were obtained and again confirmed a valgus impacted femoral neck fracture of the right hip.  We are able to obtain appropriate AP and lateral views for the surgery.  Then the right hip area was prepped with ChloraPrep and a shower curtain drape was placed.  I began the surgery by marking out on the AP and lateral planes the direction of the screw placement.  Then using the guidewire for the 6.5 millimeter screw I placed this in the more inferior position.  This was placed in the appropriate position inferiorly just above the neck of the femur into the center of the femoral head.  Once this was  placed the remaining 2 K wires were placed creating an inverted triangle position.  These were confirmed to be at the neck and well placed within the head of the femur.  Then I placed a single 6.5 mm cannulated screw in the inferior position and to 7.3 mm partially-threaded cannulated screws in the more superior position in the neck.  These got excellent bite.  The screw lengths were confirmed using the intraoperative fluoroscopy.  These were not found to be in the joint.  The joint was rotated internally and externally and there is no grinding noted.  Then the wounds were irrigated and staples were used to close the skin.  After the surgery and the surgical counts were correct.  There were no complications.  He was placed in a soft dressing of 4 x 4's and Tegaderm.   Post Op Instructions: Patient will be weightbearing as tolerated in the right lower cavity. He will work with physical therapy for mobilization He will be discharged once cleared from physical therapy and medical team. He will follow-up with me in 2 weeks for staple removal. He will call the office with any concerns. He does not need DVT prophylaxis from my standpoint as he is weightbearing as tolerated   Tourniquette Time: Not applicable  Estimated Blood Loss:  Minimal         Drains: none  Blood Given: none         Specimens: none       Complications:  * No complications entered in OR log *         Disposition: PACU -  hemodynamically stable.         Condition: stable

## 2018-06-24 NOTE — Progress Notes (Signed)
Patient drowsy but arousable, able to verbalize orientation to [facility], admitted for broken hip and that the doctor was going to do surgery to correct fracture. Patient verbalized he had no questions and agreed to sign consent but was unable to physically do so, patient asked wife to sign consent for him.

## 2018-06-24 NOTE — Transfer of Care (Signed)
Immediate Anesthesia Transfer of Care Note  Patient: David [name]  Procedure(s) Performed: RIGHT CANNULATED HIP PINNING (Right )  Patient Location: PACU  Anesthesia Type:General  Level of Consciousness: drowsy and patient cooperative  Airway & Oxygen Therapy: Patient Spontanous Breathing and Patient connected to face mask  Post-op Assessment: Report given to RN and Post -op Vital signs reviewed and stable  Post vital signs: Reviewed and stable  Last Vitals:  Vitals Value Taken Time  BP 120/75 06/24/2018  7:30 PM  Temp    Pulse 78 06/24/2018  7:30 PM  Resp 21 06/24/2018  7:30 PM  SpO2 97 % 06/24/2018  7:30 PM  Vitals shown include unvalidated device data.  Last Pain:  Vitals:   06/24/18 1640  TempSrc:   PainSc: Asleep      Patients Stated Pain Goal: 3 (90/24/09 7353)  Complications: No apparent anesthesia complications

## 2018-06-24 NOTE — Anesthesia Preprocedure Evaluation (Addendum)
Anesthesia Evaluation  Patient identified by MRN, date of birth, ID band Patient awake    Reviewed: Allergy & Precautions, NPO status , Patient's Chart, lab work & pertinent test results  History of Anesthesia Complications Negative for: history of anesthetic complications  Airway Mallampati: III  TM Distance: >3 FB Neck ROM: Full    Dental  (+) Poor Dentition, Missing, Chipped, Loose, Dental Advisory Given   Pulmonary Current Smoker,    breath sounds clear to auscultation       Cardiovascular hypertension, Pt. on medications (-) angina Rhythm:Regular Rate:Normal     Neuro/Psych PSYCHIATRIC DISORDERS (ADD) Anxiety Depression negative neurological ROS     GI/Hepatic negative GI ROS, (+)     substance abuse  alcohol use and marijuana use,   Endo/Other  negative endocrine ROS  Renal/GU negative Renal ROS  negative genitourinary   Musculoskeletal negative musculoskeletal ROS (+)   Abdominal   Peds  (+) ATTENTION DEFICIT DISORDER WITHOUT HYPERACTIVITY Hematology  (+) Blood dyscrasia (Hb 8.7), anemia ,   Anesthesia Other Findings   Reproductive/Obstetrics                           Anesthesia Physical Anesthesia Plan  ASA: II  Anesthesia Plan: General   Post-op Pain Management:    Induction: Intravenous  PONV Risk Score and Plan: 2 and Treatment may vary due to age or medical condition, Ondansetron and Dexamethasone  Airway Management Planned: Oral ETT  Additional Equipment: None  Intra-op Plan:   Post-operative Plan: Extubation in OR  Informed Consent: I have reviewed the patients History and Physical, chart, labs and discussed the procedure including the risks, benefits and alternatives for the proposed anesthesia with the patient or authorized representative who has indicated his/her understanding and acceptance.   Dental advisory given  Plan Discussed with: CRNA and  Anesthesiologist  Anesthesia Plan Comments: (Plan routine monitors, GETA)       Anesthesia Quick Evaluation

## 2018-06-24 NOTE — Progress Notes (Signed)
PROGRESS NOTE    David [name]  AUQ:[id number] DOB: 1955-10-07 DOA: 06/23/2018 PCP: [name], MD   Brief Narrative:  62 year old male with prior h/o hypertension, ADD, alcohol abuse, depression, substance abuse, had a fall earlier, was unable to ambulate and was found to have impacted right femoral neck fracture. He was admitted for surgical repair of the hip.    Assessment & Plan:   Principal Problem:   Closed right hip fracture, initial encounter ([place]) Active Problems:   Alcohol abuse with alcohol-induced mood disorder ([place])   HTN (hypertension)   Hypertensive urgency   Depression with anxiety   Microcytic anemia   Right Hip fracture:  Admitted for evaluation by orthopedics, and plan for surgical repair today.  Resume IV fluids, IV pain control and npo.    Alcohol dependence:  Started the CIWA, pt currently restless, but no obvious signs of withdrawals.  Continue with vitamin B1 and MVI.    Hypertensive urgency:  Improved. Currently on iv hydralazine prn.    Depression with anxiety:  Resume lyrica and prozac.    Microcytic anemia:  Hemoglobin drop from 10 to 8 today after IV fluids.  Anemia panel shows normal iron levels, with low ferritin, low vitamin b12 levels.  Stool for occult blood. Supplementation with vitamin b12 and iron supplementation.  Continue to monitor and transfuse to keep hemoglobin greater than 7.    Leukocytosis:  Possibly reactive. Resolved with hydration.    Substance abuse:  UDS is positive for cocaine and benzos, watch for signs of withdrawal.  Social work to be consulted for resources.    DVT prophylaxis: scd's Code Status: full code.  Family Communication: none at bedside.  Disposition Plan: pending further evaluation,   Consultants:   Orthopedics.    Procedures: scheduled for OR today  Antimicrobials: one dose of cefazoline.   Subjective: Reports his pain is not well controlled.  Wants to know when he is  scheduled for the surgery.  No chest pain or sob, no nausea, or vomiting or abdominal pain.   Objective: Vitals:   06/23/18 2300 06/23/18 2305 06/24/18 0350 06/24/18 0553  BP: 114/69  129/77 (!) 147/89  Pulse: 70  68 (!) 122  Resp: 20  20 15   Temp: 98.4 F (36.9 C)  98.3 F (36.8 C) 98 F (36.7 C)  TempSrc: Axillary  Oral Oral  SpO2: 90% 98% 98% 98%  Weight:      Height:        Intake/Output Summary (Last 24 hours) at 06/24/2018 1146 Last data filed at 06/24/2018 1130 Gross per 24 hour  Intake 1983.97 ml  Output 1150 ml  Net 833.97 ml   Filed Weights   06/23/18 1519  Weight: 77.1 kg    Examination:  General exam: Appears restless , reports having pain.  Respiratory system: Clear to auscultation. Respiratory effort normal. Cardiovascular system: S1 & S2 heard, RRR. Marland Kitchen No pedal edema. Gastrointestinal system: Abdomen is nondistended, soft and nontender. No organomegaly or masses felt. Normal bowel sounds heard. Central nervous system: Alert and oriented to place and person only.  Extremities: Symmetric 5 x 5 power. Skin: No rashes, lesions or ulcers Psychiatry: restless,     Data Reviewed: I have personally reviewed following labs and imaging studies  CBC: Recent Labs  Lab 06/23/18 1803 06/24/18 0523  WBC 14.2* 9.8  NEUTROABS 11.2*  --   HGB 10.4* 8.7*  HCT 35.5* 29.8*  MCV 77.5* 77.6*  PLT 429* 638   Basic Metabolic  Panel: Recent Labs  Lab 06/23/18 1803 06/24/18 0523  NA 142 140  K 3.8 4.0  CL 106 109  CO2 27 26  GLUCOSE 101* 101*  BUN 9 10  CREATININE 0.90 0.82  CALCIUM 9.2 8.3*   GFR: Estimated Creatinine Clearance: 90.4 mL/min (by C-G formula based on SCr of 0.82 mg/dL). Liver Function Tests: No results for input(s): AST, ALT, ALKPHOS, BILITOT, PROT, ALBUMIN in the last 168 hours. No results for input(s): LIPASE, AMYLASE in the last 168 hours. No results for input(s): AMMONIA in the last 168 hours. Coagulation Profile: Recent Labs  Lab  06/23/18 1803  INR 0.87   Cardiac Enzymes: No results for input(s): CKTOTAL, CKMB, CKMBINDEX, TROPONINI in the last 168 hours. BNP (last 3 results) No results for input(s): PROBNP in the last 8760 hours. HbA1C: No results for input(s): HGBA1C in the last 72 hours. CBG: No results for input(s): GLUCAP in the last 168 hours. Lipid Profile: No results for input(s): CHOL, HDL, LDLCALC, TRIG, CHOLHDL, LDLDIRECT in the last 72 hours. Thyroid Function Tests: No results for input(s): TSH, T4TOTAL, FREET4, T3FREE, THYROIDAB in the last 72 hours. Anemia Panel: Recent Labs    06/24/18 0523  VITAMINB12 132*  FOLATE 7.7  FERRITIN 7*  TIBC 396  IRON 84  RETICCTPCT 1.5   Sepsis Labs: No results for input(s): PROCALCITON, LATICACIDVEN in the last 168 hours.  Recent Results (from the past 240 hour(s))  Surgical PCR screen     Status: Abnormal   Collection Time: 06/24/18  3:00 AM  Result Value Ref Range Status   MRSA, PCR POSITIVE (A) NEGATIVE Final    Comment: RESULT CALLED TO, READ BACK BY AND VERIFIED WITH: TAYLOR,J. RN @0755  ON 10.10.19 BY [id number]    Staphylococcus aureus POSITIVE (A) NEGATIVE Final    Comment: (NOTE) The Xpert SA Assay (FDA approved for NASAL specimens in patients 85 years of age and older), is one component of a comprehensive surveillance program. It is not intended to diagnose infection nor to guide or monitor treatment. Performed at [facility], [place] [address] [place], [place] [address]          Radiology Studies: Dg Chest 2 View  Result Date: 06/23/2018 CLINICAL DATA:  Trip and fall EXAM: CHEST - 2 VIEW COMPARISON:  09/03/2012 FINDINGS: Cardiac shadow is stable. Lungs are well aerated bilaterally. Mild accentuation of the mediastinum and hila is noted related to patient rotation to the left. No focal infiltrate or effusion is seen. No acute bony abnormality is noted. IMPRESSION: No acute abnormality noted. Electronically Signed   By:  Inez Catalina M.D.   On: 06/23/2018 18:56   Dg Pelvis 1-2 Views  Result Date: 06/23/2018 CLINICAL DATA:  Trip and fall this morning with pelvic pain, initial encounter EXAM: PELVIS - 1-2 VIEW COMPARISON:  None. FINDINGS: The pelvic ring is intact. There is a transverse fracture through the right femoral neck with some impaction at the fracture site. No other fractures are seen. IMPRESSION: Right femoral neck fracture with impaction at the fracture site. Electronically Signed   By: Inez Catalina M.D.   On: 06/23/2018 18:57   Ct Head Wo Contrast  Result Date: 06/23/2018 CLINICAL DATA:  Recent trip and fall with right femur fracture, headaches and neck pain EXAM: CT HEAD WITHOUT CONTRAST CT CERVICAL SPINE WITHOUT CONTRAST TECHNIQUE: Multidetector CT imaging of the head and cervical spine was performed following the standard protocol without intravenous contrast. Multiplanar CT image reconstructions of the cervical  spine were also generated. COMPARISON:  None. FINDINGS: CT HEAD FINDINGS Brain: No evidence of acute infarction, hemorrhage, hydrocephalus, extra-axial collection or mass lesion/mass effect. Vascular: No hyperdense vessel or unexpected calcification. Skull: Normal. Negative for fracture or focal lesion. Sinuses/Orbits: No acute finding. Other: None. CT CERVICAL SPINE FINDINGS Alignment: Mild loss of the normal cervical lordosis is noted. Skull base and vertebrae: 7 cervical segments are well visualized. Mild facet hypertrophic changes and osteophytic changes are seen. Anterolisthesis of C3 on C4 is seen of a degenerative nature. No acute fracture or acute facet abnormality is noted. Disc space narrowing is noted at C6-7 with associated osteophytes. Soft tissues and spinal canal: Mild vascular calcifications are noted. No soft tissue abnormality is seen. Upper chest: Visualized lung fields are within normal limits. Motion artifact limits the bony detail. Other: None IMPRESSION: CT of the head: No acute  intracranial abnormality noted. CT of the cervical spine: Multilevel degenerative change without acute abnormality. Electronically Signed   By: Inez Catalina M.D.   On: 06/23/2018 19:03   Ct Cervical Spine Wo Contrast  Result Date: 06/23/2018 CLINICAL DATA:  Recent trip and fall with right femur fracture, headaches and neck pain EXAM: CT HEAD WITHOUT CONTRAST CT CERVICAL SPINE WITHOUT CONTRAST TECHNIQUE: Multidetector CT imaging of the head and cervical spine was performed following the standard protocol without intravenous contrast. Multiplanar CT image reconstructions of the cervical spine were also generated. COMPARISON:  None. FINDINGS: CT HEAD FINDINGS Brain: No evidence of acute infarction, hemorrhage, hydrocephalus, extra-axial collection or mass lesion/mass effect. Vascular: No hyperdense vessel or unexpected calcification. Skull: Normal. Negative for fracture or focal lesion. Sinuses/Orbits: No acute finding. Other: None. CT CERVICAL SPINE FINDINGS Alignment: Mild loss of the normal cervical lordosis is noted. Skull base and vertebrae: 7 cervical segments are well visualized. Mild facet hypertrophic changes and osteophytic changes are seen. Anterolisthesis of C3 on C4 is seen of a degenerative nature. No acute fracture or acute facet abnormality is noted. Disc space narrowing is noted at C6-7 with associated osteophytes. Soft tissues and spinal canal: Mild vascular calcifications are noted. No soft tissue abnormality is seen. Upper chest: Visualized lung fields are within normal limits. Motion artifact limits the bony detail. Other: None IMPRESSION: CT of the head: No acute intracranial abnormality noted. CT of the cervical spine: Multilevel degenerative change without acute abnormality. Electronically Signed   By: Inez Catalina M.D.   On: 06/23/2018 19:03   Dg Femur Min 2 Views Right  Result Date: 06/23/2018 CLINICAL DATA:  Recent trip and fall with right leg pain, initial encounter EXAM: RIGHT FEMUR  2 VIEWS COMPARISON:  None. FINDINGS: Right femoral neck fracture with impaction is again identified and stable. The remainder of the femur is within normal limits. No soft tissue changes are seen. IMPRESSION: Right femoral neck fracture with impaction at the fracture site. Electronically Signed   By: Inez Catalina M.D.   On: 06/23/2018 18:58        Scheduled Meds: . cloNIDine  0.3 mg Oral BID  . FLUoxetine  20 mg Oral Daily  . folic acid  1 mg Oral Daily  . LORazepam  0-4 mg Intravenous Q6H   Followed by  . [START ON 06/25/2018] LORazepam  0-4 mg Intravenous Q12H  . multivitamin with minerals  1 tablet Oral Daily  . pregabalin  150 mg Oral BID  . thiamine  100 mg Oral Daily   Or  . thiamine  100 mg Intravenous Daily   Continuous Infusions: .  ceFAZolin (ANCEF) IV       LOS: 1 day    Time spent: 42 min   Hosie Poisson, MD [facility] Pager [phone]   If 7PM-7AM, please contact night-coverage [email or web address] Password [email or web address] 06/24/2018, 11:46 AM

## 2018-06-24 NOTE — Progress Notes (Signed)
Initial Nutrition Assessment  INTERVENTION:   -Diet advancement per MD -Once diet advanced, provide Boost Plus chocolate TID- Each supplement provides 360kcal and 14g protein.    NUTRITION DIAGNOSIS:   Increased nutrient needs related to (hip fracture) as evidenced by estimated needs.  GOAL:   Patient will meet greater than or equal to 90% of their needs  MONITOR:   Diet advancement, Labs, Weight trends, I & O's  REASON FOR ASSESSMENT:   Consult Hip fracture protocol  ASSESSMENT:   62 year old male with prior h/o hypertension, ADD, alcohol abuse, depression, substance abuse, had a fall earlier, was unable to ambulate and was found to have impacted right femoral neck fracture. He was admitted for surgical repair of the hip.   Patient in room, very sleepy. Just received a dose of Ativan. Pt wasn't able to provide much nutrition history but was agreeable to receiving Boost supplements post-op. Pt currently NPO for hip surgery this afternoon. Pt appreciative of visit. RD to order Boost Plus TID once diet is advanced.   Per weight records, pt has had no recent weight loss and pt didn't report any.  Labs reviewed. Medications: IV Vitamin Z-22 once, folic acid tablet daily, Multivitamin with minerals daily, IV Thiamine daily  NUTRITION - FOCUSED PHYSICAL EXAM:  Nutrition focused physical exam shows no sign of depletion of muscle mass or body fat.  Diet Order:   Diet Order            Diet NPO time specified Except for: Sips with Meds  Diet effective midnight        Diet NPO time specified Except for: Ice Chips  Diet effective midnight              EDUCATION NEEDS:   Not appropriate for education at this time  Skin:  Skin Assessment: Reviewed RN Assessment  Last BM:  10/9  Height:   Ht Readings from Last 1 Encounters:  06/23/18 5\' 8"  (1.727 m)    Weight:   Wt Readings from Last 1 Encounters:  06/23/18 77.1 kg    Ideal Body Weight:  70 kg  BMI:  Body mass  index is 25.85 kg/m.  Estimated Nutritional Needs:   Kcal:  2100-2300  Protein:  110-120g  Fluid:  2.1L/day  Clayton Bibles, MS, RD, LDN [place] [facility] Pager: [phone] After Hours Pager: [phone]

## 2018-06-24 NOTE — Anesthesia Postprocedure Evaluation (Signed)
Anesthesia Post Note  Patient: David [name]  Procedure(s) Performed: RIGHT CANNULATED HIP PINNING (Right )     Patient location during evaluation: PACU Anesthesia Type: General Level of consciousness: awake and alert, oriented and patient cooperative Pain management: pain level controlled Vital Signs Assessment: post-procedure vital signs reviewed and stable Respiratory status: spontaneous breathing, nonlabored ventilation, respiratory function stable and patient connected to nasal cannula oxygen Cardiovascular status: blood pressure returned to baseline and stable Postop Assessment: no apparent nausea or vomiting Anesthetic complications: no    Last Vitals:  Vitals:   06/24/18 1930 06/24/18 1945  BP: 120/75 122/71  Pulse: 78 74  Resp: 15 17  Temp:    SpO2: 97% 93%    Last Pain:  Vitals:   06/24/18 1945  TempSrc:   PainSc: 0-No pain                 Unnamed Hino,E. Doree Kuehne

## 2018-06-24 NOTE — Plan of Care (Signed)
Hip fracture care plan initiated

## 2018-06-24 NOTE — Progress Notes (Signed)
Subjective: Patient reports pain in the right hip.  Pain is severe.  He has not been out of bed.  He has been n.p.o. since midnight.  He is anticipating surgery today.  He is requesting a sleeping aid.  Denies any numbness or tingling in his right leg.  Denies any other joint or extremity pain.  Denies shortness of breath.   Objective: Vital signs in last 24 hours: Temp:  [98 F (36.7 C)-98.8 F (37.1 C)] 98 F (36.7 C) (10/10 0553) Pulse Rate:  [67-122] 122 (10/10 0553) Resp:  [15-20] 15 (10/10 0553) BP: (114-198)/(69-123) 147/89 (10/10 0553) SpO2:  [90 %-100 %] 98 % (10/10 0553) Weight:  [77.1 kg] 77.1 kg (10/09 1519)  Intake/Output from previous day: 10/09 0701 - 10/10 0700 In: 1799.4 [I.V.:799.4; IV Piggyback:1000] Out: 800 [Urine:800] Intake/Output this shift: Total I/O In: 184.6 [I.V.:184.6] Out: -   Recent Labs    06/23/18 1803 06/24/18 0523  HGB 10.4* 8.7*   Recent Labs    06/23/18 1803 06/24/18 0523  WBC 14.2* 9.8  RBC 4.58 3.84*  3.84*  HCT 35.5* 29.8*  PLT 429* 345   Recent Labs    06/23/18 1803 06/24/18 0523  NA 142 140  K 3.8 4.0  CL 106 109  CO2 27 26  BUN 9 10  CREATININE 0.90 0.82  GLUCOSE 101* 101*  CALCIUM 9.2 8.3*   Recent Labs    06/23/18 1803  INR 0.87    Awake and alert Respirations even unlabored Skin intact and dry. Musculoskeletal: Patient has pain with logroll of right hip.  He is able to move his right lower extremity with hip flexion, external rotation, internal rotation but this does cause pain.  He has no tenderness palpation about the right thigh, knee, leg or foot.  He endorses intact sensation about the leg and foot.  His foot is warm and well-perfused. No tenderness palpation about the left thigh, left knee, leg or foot.  No pain with hip logroll.  No tenderness palpation bilateral upper extremity's.  He is able to move bilateral upper extremities there is no sign of injury.   Imaging: There is a valgus impacted  femoral neck fracture on the right.   Assessment/Plan: We will plan to proceed with percutaneous screw fixation of his right femoral neck fracture.  This will occur later this afternoon as there was a delay in the OR.  He will remain n.p.o.  He will remain nonweightbearing on bedrest precautions.  The risks, benefits and alternatives to the surgery were discussed with him.  The consent was signed by myself and the patient.  Postoperatively the patient will be made weightbearing as tolerated and will work with physical therapy.  Discharge will be dependent on physical therapy evaluation and medical clearance.  Hold DVT prophylaxis for now.   Erle Crocker 06/24/2018, 11:07 AM

## 2018-06-25 ENCOUNTER — Encounter (HOSPITAL_COMMUNITY): Payer: Self-pay | Admitting: Orthopaedic Surgery

## 2018-06-25 LAB — CBC WITH DIFFERENTIAL/PLATELET
ABS IMMATURE GRANULOCYTES: 0.05 10*3/uL (ref 0.00–0.07)
BASOS PCT: 0 %
Basophils Absolute: 0 10*3/uL (ref 0.0–0.1)
Eosinophils Absolute: 0 10*3/uL (ref 0.0–0.5)
Eosinophils Relative: 0 %
HCT: 26.7 % — ABNORMAL LOW (ref 39.0–52.0)
HEMOGLOBIN: 7.8 g/dL — AB (ref 13.0–17.0)
Immature Granulocytes: 0 %
LYMPHS ABS: 2.4 10*3/uL (ref 0.7–4.0)
LYMPHS PCT: 17 %
MCH: 22.8 pg — AB (ref 26.0–34.0)
MCHC: 29.2 g/dL — ABNORMAL LOW (ref 30.0–36.0)
MCV: 78.1 fL — AB (ref 80.0–100.0)
MONO ABS: 1.4 10*3/uL — AB (ref 0.1–1.0)
MONOS PCT: 10 %
NEUTROS ABS: 10.1 10*3/uL — AB (ref 1.7–7.7)
NEUTROS PCT: 73 %
PLATELETS: 306 10*3/uL (ref 150–400)
RBC: 3.42 MIL/uL — ABNORMAL LOW (ref 4.22–5.81)
RDW: 19 % — ABNORMAL HIGH (ref 11.5–15.5)
WBC: 13.9 10*3/uL — ABNORMAL HIGH (ref 4.0–10.5)
nRBC: 0 % (ref 0.0–0.2)

## 2018-06-25 MED ORDER — FERROUS SULFATE 325 (65 FE) MG PO TABS
325.0000 mg | ORAL_TABLET | Freq: Two times a day (BID) | ORAL | Status: DC
  Administered 2018-06-25 – 2018-06-26 (×2): 325 mg via ORAL
  Filled 2018-06-25 (×2): qty 1

## 2018-06-25 MED ORDER — HYDROCODONE-ACETAMINOPHEN 5-325 MG PO TABS
2.0000 | ORAL_TABLET | ORAL | Status: DC | PRN
  Administered 2018-06-25 – 2018-06-26 (×5): 2 via ORAL
  Filled 2018-06-25 (×5): qty 2

## 2018-06-25 MED ORDER — SODIUM CHLORIDE 0.9 % IV SOLN
INTRAVENOUS | Status: DC | PRN
  Administered 2018-06-25: 1000 mL via INTRAVENOUS

## 2018-06-25 MED ORDER — BOOST PLUS PO LIQD
237.0000 mL | Freq: Three times a day (TID) | ORAL | Status: DC
  Administered 2018-06-25 – 2018-06-26 (×3): 237 mL via ORAL
  Filled 2018-06-25 (×4): qty 237

## 2018-06-25 NOTE — Progress Notes (Signed)
PROGRESS NOTE    LIVIO LEDWITH  YBO:[id number] DOB: 1956/07/08 DOA: 06/23/2018 PCP: [name], MD   Brief Narrative:  62 year old male with prior h/o hypertension, ADD, alcohol abuse, depression, substance abuse, had a fall earlier, was unable to ambulate and was found to have impacted right femoral neck fracture. He was admitted for surgical repair of the hip.    Assessment & Plan:   Principal Problem:   Closed right hip fracture, initial encounter ([place]) Active Problems:   Alcohol abuse with alcohol-induced mood disorder ([place])   HTN (hypertension)   Hypertensive urgency   Depression with anxiety   Microcytic anemia   Right valgus impacted femoral neck  fracture:  Admitted for evaluation by orthopedics, underwent right cannulated hip pinning Pain control with oral meds today.  Physical therapy recommended home health PT.     Alcohol dependence:  Started the CIWA,  but no obvious signs of withdrawals. Pt adamant and denies any alcohol abuse at this time. He reports his last alcohol use was 2 weeks.  Continue with vitamin B1 and MVI.    Hypertensive urgency:  Improved. Currently on Iv hydralazine prn.    Depression with anxiety:  Resume lyrica and prozac.    Microcytic anemia:  Hemoglobin drop from 10 to 8 today after IV fluids.  Anemia panel shows normal iron levels, with low ferritin, low vitamin b12 levels.  Stool for occult blood pending. Supplementation with vitamin b12 and iron supplementation.  Continue to monitor and transfuse to keep hemoglobin greater than 7.    Leukocytosis:  Possibly reactive. Resolved with hydration.    Substance abuse:  UDS is positive for cocaine and benzos, watch for signs of withdrawal.  Social work to be consulted for resources.    DVT prophylaxis: scd's Code Status: full code.  Family Communication: none at bedside.  Disposition Plan: pending further evaluation,   Consultants:   Orthopedics.    Procedures:  surgical repair of the hip.   Antimicrobials: one dose of cefazoline.   Subjective: Reports his pain is not well controlled.  Wants to know when he is scheduled for the surgery.  No chest pain or sob, no nausea, or vomiting or abdominal pain.   Objective: Vitals:   06/24/18 2225 06/24/18 2326 06/25/18 0200 06/25/18 0525  BP: (!) 160/83 128/77 126/73 116/70  Pulse: 67 62 (!) 54 61  Resp: 16 15 16 16   Temp: (!) 97.5 F (36.4 C)   97.6 F (36.4 C)  TempSrc: Oral   Oral  SpO2: 100% 100% 100% 100%  Weight:      Height:        Intake/Output Summary (Last 24 hours) at 06/25/2018 0854 Last data filed at 06/25/2018 0539 Gross per 24 hour  Intake 2352.25 ml  Output 1625 ml  Net 727.25 ml   Filed Weights   06/23/18 1519  Weight: 77.1 kg    Examination:  General exam: not distress.  Respiratory system: Clear to auscultation. Respiratory effort normal.no wheezing or rhonchi.  Cardiovascular system: S1 & S2 heard, RRR. Marland Kitchen No pedal edema. Gastrointestinal system: Abdomen is soft NT ND BS+ Central nervous system: Alert and oriented, NO FOCAL   Extremities: Symmetric 5 x 5 power. Skin: No rashes, lesions or ulcers Psychiatry: restless, but not agitated and no signs of withdrawal.     Data Reviewed: I have personally reviewed following labs and imaging studies  CBC: Recent Labs  Lab 06/23/18 1803 06/24/18 0523  WBC 14.2* 9.8  NEUTROABS 11.2*  --  HGB 10.4* 8.7*  HCT 35.5* 29.8*  MCV 77.5* 77.6*  PLT 429* 536   Basic Metabolic Panel: Recent Labs  Lab 06/23/18 1803 06/24/18 0523  NA 142 140  K 3.8 4.0  CL 106 109  CO2 27 26  GLUCOSE 101* 101*  BUN 9 10  CREATININE 0.90 0.82  CALCIUM 9.2 8.3*   GFR: Estimated Creatinine Clearance: 90.4 mL/min (by C-G formula based on SCr of 0.82 mg/dL). Liver Function Tests: No results for input(s): AST, ALT, ALKPHOS, BILITOT, PROT, ALBUMIN in the last 168 hours. No results for input(s): LIPASE, AMYLASE in the last 168  hours. No results for input(s): AMMONIA in the last 168 hours. Coagulation Profile: Recent Labs  Lab 06/23/18 1803  INR 0.87   Cardiac Enzymes: No results for input(s): CKTOTAL, CKMB, CKMBINDEX, TROPONINI in the last 168 hours. BNP (last 3 results) No results for input(s): PROBNP in the last 8760 hours. HbA1C: No results for input(s): HGBA1C in the last 72 hours. CBG: No results for input(s): GLUCAP in the last 168 hours. Lipid Profile: No results for input(s): CHOL, HDL, LDLCALC, TRIG, CHOLHDL, LDLDIRECT in the last 72 hours. Thyroid Function Tests: No results for input(s): TSH, T4TOTAL, FREET4, T3FREE, THYROIDAB in the last 72 hours. Anemia Panel: Recent Labs    06/24/18 0523  VITAMINB12 132*  FOLATE 7.7  FERRITIN 7*  TIBC 396  IRON 84  RETICCTPCT 1.5   Sepsis Labs: No results for input(s): PROCALCITON, LATICACIDVEN in the last 168 hours.  Recent Results (from the past 240 hour(s))  Surgical PCR screen     Status: Abnormal   Collection Time: 06/24/18  3:00 AM  Result Value Ref Range Status   MRSA, PCR POSITIVE (A) NEGATIVE Final    Comment: RESULT CALLED TO, READ BACK BY AND VERIFIED WITH: TAYLOR,J. RN @0755  ON 10.10.19 BY [email or web address]    Staphylococcus aureus POSITIVE (A) NEGATIVE Final    Comment: (NOTE) The Xpert SA Assay (FDA approved for NASAL specimens in patients 11 years of age and older), is one component of a comprehensive surveillance program. It is not intended to diagnose infection nor to guide or monitor treatment. Performed at [facility], [place] [address], [place], [place] [address]          Radiology Studies: Dg Chest 2 View  Result Date: 06/23/2018 CLINICAL DATA:  Trip and fall EXAM: CHEST - 2 VIEW COMPARISON:  09/03/2012 FINDINGS: Cardiac shadow is stable. Lungs are well aerated bilaterally. Mild accentuation of the mediastinum and hila is noted related to patient rotation to the left. No focal infiltrate or effusion is  seen. No acute bony abnormality is noted. IMPRESSION: No acute abnormality noted. Electronically Signed   By: Inez Catalina M.D.   On: 06/23/2018 18:56   Dg Pelvis 1-2 Views  Result Date: 06/23/2018 CLINICAL DATA:  Trip and fall this morning with pelvic pain, initial encounter EXAM: PELVIS - 1-2 VIEW COMPARISON:  None. FINDINGS: The pelvic ring is intact. There is a transverse fracture through the right femoral neck with some impaction at the fracture site. No other fractures are seen. IMPRESSION: Right femoral neck fracture with impaction at the fracture site. Electronically Signed   By: Inez Catalina M.D.   On: 06/23/2018 18:57   Ct Head Wo Contrast  Result Date: 06/23/2018 CLINICAL DATA:  Recent trip and fall with right femur fracture, headaches and neck pain EXAM: CT HEAD WITHOUT CONTRAST CT CERVICAL SPINE WITHOUT CONTRAST TECHNIQUE: Multidetector CT imaging of the head  and cervical spine was performed following the standard protocol without intravenous contrast. Multiplanar CT image reconstructions of the cervical spine were also generated. COMPARISON:  None. FINDINGS: CT HEAD FINDINGS Brain: No evidence of acute infarction, hemorrhage, hydrocephalus, extra-axial collection or mass lesion/mass effect. Vascular: No hyperdense vessel or unexpected calcification. Skull: Normal. Negative for fracture or focal lesion. Sinuses/Orbits: No acute finding. Other: None. CT CERVICAL SPINE FINDINGS Alignment: Mild loss of the normal cervical lordosis is noted. Skull base and vertebrae: 7 cervical segments are well visualized. Mild facet hypertrophic changes and osteophytic changes are seen. Anterolisthesis of C3 on C4 is seen of a degenerative nature. No acute fracture or acute facet abnormality is noted. Disc space narrowing is noted at C6-7 with associated osteophytes. Soft tissues and spinal canal: Mild vascular calcifications are noted. No soft tissue abnormality is seen. Upper chest: Visualized lung fields are  within normal limits. Motion artifact limits the bony detail. Other: None IMPRESSION: CT of the head: No acute intracranial abnormality noted. CT of the cervical spine: Multilevel degenerative change without acute abnormality. Electronically Signed   By: Inez Catalina M.D.   On: 06/23/2018 19:03   Ct Cervical Spine Wo Contrast  Result Date: 06/23/2018 CLINICAL DATA:  Recent trip and fall with right femur fracture, headaches and neck pain EXAM: CT HEAD WITHOUT CONTRAST CT CERVICAL SPINE WITHOUT CONTRAST TECHNIQUE: Multidetector CT imaging of the head and cervical spine was performed following the standard protocol without intravenous contrast. Multiplanar CT image reconstructions of the cervical spine were also generated. COMPARISON:  None. FINDINGS: CT HEAD FINDINGS Brain: No evidence of acute infarction, hemorrhage, hydrocephalus, extra-axial collection or mass lesion/mass effect. Vascular: No hyperdense vessel or unexpected calcification. Skull: Normal. Negative for fracture or focal lesion. Sinuses/Orbits: No acute finding. Other: None. CT CERVICAL SPINE FINDINGS Alignment: Mild loss of the normal cervical lordosis is noted. Skull base and vertebrae: 7 cervical segments are well visualized. Mild facet hypertrophic changes and osteophytic changes are seen. Anterolisthesis of C3 on C4 is seen of a degenerative nature. No acute fracture or acute facet abnormality is noted. Disc space narrowing is noted at C6-7 with associated osteophytes. Soft tissues and spinal canal: Mild vascular calcifications are noted. No soft tissue abnormality is seen. Upper chest: Visualized lung fields are within normal limits. Motion artifact limits the bony detail. Other: None IMPRESSION: CT of the head: No acute intracranial abnormality noted. CT of the cervical spine: Multilevel degenerative change without acute abnormality. Electronically Signed   By: Inez Catalina M.D.   On: 06/23/2018 19:03   Dg C-arm 1-60 Min-no  Report  Result Date: 06/24/2018 Fluoroscopy was utilized by the requesting physician.  No radiographic interpretation.   Dg Femur Min 2 Views Right  Result Date: 06/23/2018 CLINICAL DATA:  Recent trip and fall with right leg pain, initial encounter EXAM: RIGHT FEMUR 2 VIEWS COMPARISON:  None. FINDINGS: Right femoral neck fracture with impaction is again identified and stable. The remainder of the femur is within normal limits. No soft tissue changes are seen. IMPRESSION: Right femoral neck fracture with impaction at the fracture site. Electronically Signed   By: Inez Catalina M.D.   On: 06/23/2018 18:58        Scheduled Meds: . Chlorhexidine Gluconate Cloth  6 each Topical Q0600  . cloNIDine  0.3 mg Oral BID  . FLUoxetine  20 mg Oral Daily  . folic acid  1 mg Oral Daily  . lactose free nutrition  237 mL Oral TID WC  .  LORazepam  0-4 mg Intravenous Q6H   Followed by  . LORazepam  0-4 mg Intravenous Q12H  . multivitamin with minerals  1 tablet Oral Daily  . mupirocin ointment  1 application Nasal BID  . pregabalin  150 mg Oral BID  . thiamine  100 mg Oral Daily   Or  . thiamine  100 mg Intravenous Daily  . vitamin B-12  500 mcg Oral Daily   Continuous Infusions: . sodium chloride 10 mL/hr at 06/25/18 0539  .  ceFAZolin (ANCEF) IV Stopped (06/25/18 0138)     LOS: 2 days    Time spent: 30 minutes.   Hosie Poisson, MD [facility] Pager [phone]   If 7PM-7AM, please contact night-coverage [email or web address] Password [email or web address] 06/25/2018, 8:54 AM

## 2018-06-25 NOTE — Care Management Note (Signed)
Case Management Note  Patient Details  Name: David [name] MRN: [id number] Date of Birth: 1956-08-16  Subjective/Objective:   Discharge planning, spoke with patient and spouse at bedside. Have chosen [facility] for [facility] PT, evaluate and treat.   Action/Plan: Contacted [facility] for referral. Needs RW, contacted [facility] to deliver to room. [phone]                 Expected Discharge Date:  (unknown)               Expected Discharge Plan:  [place]  In-House Referral:  NA  Discharge planning Services  CM Consult  Post Acute Care Choice:  Durable Medical Equipment, Home Health Choice offered to:  Patient  DME Arranged:  Walker rolling DME Agency:  [place]:  PT [place]:  [facility] ([facility])  Status of Service:  Completed, signed off  If discussed at [employer], dates discussed:    Additional Comments:  Guadalupe Maple, RN 06/25/2018, 2:30 PM

## 2018-06-25 NOTE — Progress Notes (Signed)
Subjective: 1 Day Post-Op Procedure(s) (LRB): RIGHT CANNULATED HIP PINNING (Right)  Pain improved since surgery.  No SOB.  Has not been out of bed.   Objective: Vital signs in last 24 hours: Temp:  [97.5 F (36.4 C)-98.1 F (36.7 C)] 97.6 F (36.4 C) (10/11 0525) Pulse Rate:  [54-78] 61 (10/11 0525) Resp:  [14-20] 16 (10/11 0525) BP: (116-160)/(69-83) 116/70 (10/11 0525) SpO2:  [92 %-100 %] 100 % (10/11 0525)  Awake and alert Respirations even unlabored Right lower extremity dressing intact without saturation.  He is able to move his right lower extremity at the hip without significant pain.  He has no tenderness palpation of the thigh knee or leg.  He is able to dorsi flex plantar flex the ankle.  Endorses sensation about the foot.  Foot is warm and well-perfused.   Assessment/Plan: 1 Day Post-Op Procedure(s) (LRB): RIGHT CANNULATED HIP PINNING (Right)  Patient is doing very well.  He would be okay for discharge from orthopedic standpoint once cleared from physical therapy and medical team.  We will discontinue his IV pain medication and start him on Norco.  He does not need any pharmacologic DVT prophylaxis as he is weightbearing as tolerated.  Continue SCDs.  We will follow-up with me in 2 weeks for wound check and suture removal.    David [name] 06/25/2018, 10:15 AM

## 2018-06-25 NOTE — Evaluation (Signed)
Physical Therapy Evaluation Patient Details Name: David [name] MRN: [id number] DOB: 1956-09-12 Today's Date: 06/25/2018   History of Present Illness  Pt s/p R hip fx with cannulated pinning.  Pt with hx of ADD, back surgery and ETOH abuse  Clinical Impression  Pt s/p R hip pinning and presents with decreased R LE strength/ROM, post op pain and balance deficits limiting functional mobility.  Pt should progress to dc home with family assist.    Follow Up Recommendations Home health PT    Equipment Recommendations  Rolling walker with 5" wheels    Recommendations for Other Services OT consult     Precautions / Restrictions Precautions Precautions: Fall Restrictions Weight Bearing Restrictions: No RLE Weight Bearing: Weight bearing as tolerated      Mobility  Bed Mobility Overal bed mobility: Needs Assistance Bed Mobility: Supine to Sit     Supine to sit: Min assist     General bed mobility comments: cues for sequence; use of bedrail to self assist  Transfers Overall transfer level: Needs assistance Equipment used: Rolling walker (2 wheeled) Transfers: Sit to/from Stand Sit to Stand: Min assist         General transfer comment: cues for LE management and use of UEs to self assist.  Assist to bring wt up and fwd and to balance in initial standing  Ambulation/Gait Ambulation/Gait assistance: Min assist Gait Distance (Feet): 120 Feet Assistive device: Rolling walker (2 wheeled) Gait Pattern/deviations: Step-to pattern;Step-through pattern;Decreased step length - right;Decreased step length - left;Shuffle;Trunk flexed Gait velocity: decr   General Gait Details: cues for posture, position from RW and initial sequence  Stairs            Wheelchair Mobility    Modified Rankin (Stroke Patients Only)       Balance Overall balance assessment: Needs assistance Sitting-balance support: No upper extremity supported;Feet supported Sitting balance-Leahy  Scale: Good     Standing balance support: Bilateral upper extremity supported Standing balance-Leahy Scale: Poor                               Pertinent Vitals/Pain Pain Assessment: 0-10 Pain Score: 3  Pain Location: R hip Pain Descriptors / Indicators: Aching;Sore Pain Intervention(s): Limited activity within patient's tolerance;Monitored during session;Premedicated before session;Ice applied    Home Living Family/patient expects to be discharged to:: Private residence   Available Help at Discharge: Family Type of Home: House Home Access: Stairs to enter Entrance Stairs-Rails: Right Entrance Stairs-Number of Steps: 3 Home Layout: One level Home Equipment: Cane - single point      Prior Function Level of Independence: Independent         Comments: used cane as needed     Hand Dominance        Extremity/Trunk Assessment   Upper Extremity Assessment Upper Extremity Assessment: Overall WFL for tasks assessed    Lower Extremity Assessment Lower Extremity Assessment: RLE deficits/detail       Communication   Communication: No difficulties  Cognition Arousal/Alertness: Awake/alert Behavior During Therapy: WFL for tasks assessed/performed Overall Cognitive Status: Within Functional Limits for tasks assessed                                 General Comments: Impulsive but cooperative      General Comments      Exercises     Assessment/Plan  PT Assessment Patient needs continued PT services  PT Problem List Decreased strength;Decreased range of motion;Decreased activity tolerance;Decreased balance;Decreased mobility;Decreased knowledge of use of DME;Pain;Decreased knowledge of precautions;Decreased safety awareness       PT Treatment Interventions DME instruction;Gait training;Stair training;Functional mobility training;Therapeutic activities;Therapeutic exercise;Patient/family education;Balance training    PT Goals  (Current goals can be found in the Care Plan section)  Acute Rehab PT Goals Patient Stated Goal: Home PT Goal Formulation: With patient Time For Goal Achievement: 07/02/18 Potential to Achieve Goals: Good    Frequency Min 6X/week   Barriers to discharge        Co-evaluation               AM-PAC PT "6 Clicks" Daily Activity  Outcome Measure Difficulty turning over in bed (including adjusting bedclothes, sheets and blankets)?: Unable Difficulty moving from lying on back to sitting on the side of the bed? : Unable Difficulty sitting down on and standing up from a chair with arms (e.g., wheelchair, bedside commode, etc,.)?: Unable Help needed moving to and from a bed to chair (including a wheelchair)?: A Little Help needed walking in hospital room?: A Little Help needed climbing 3-5 steps with a railing? : A Little 6 Click Score: 12    End of Session         PT Visit Diagnosis: Unsteadiness on feet (R26.81);Difficulty in walking, not elsewhere classified (R26.2);Pain Pain - Right/Left: Right Pain - part of body: Hip    Time: 1425-1450 PT Time Calculation (min) (ACUTE ONLY): 25 min   Charges:   PT Evaluation $PT Eval Low Complexity: 1 Low PT Treatments $Gait Training: 8-22 mins        Debe Coder PT Acute Rehabilitation Services Pager [phone] Office [phone]   David [name] 06/25/2018, 1:59 PM

## 2018-06-25 NOTE — Progress Notes (Signed)
CSW consult-SNF Plan: Home w/Home Health Patient plans to return home with his wife.  CSW will sign off for now.   Kathrin Greathouse, Marlinda Mike, MSW Clinical Social Worker  [phone] 06/25/2018  1:58 PM

## 2018-06-26 LAB — BASIC METABOLIC PANEL
ANION GAP: 7 (ref 5–15)
BUN: 18 mg/dL (ref 8–23)
CO2: 26 mmol/L (ref 22–32)
Calcium: 8.3 mg/dL — ABNORMAL LOW (ref 8.9–10.3)
Chloride: 104 mmol/L (ref 98–111)
Creatinine, Ser: 1.04 mg/dL (ref 0.61–1.24)
GFR calc Af Amer: >60 mL/min (ref >60)
Glucose, Bld: 120 mg/dL — ABNORMAL HIGH (ref 70–99)
POTASSIUM: 3.9 mmol/L (ref 3.5–5.1)
Sodium: 137 mmol/L (ref 135–145)

## 2018-06-26 MED ORDER — HYDROCODONE-ACETAMINOPHEN 5-325 MG PO TABS: 2.0000 | ORAL_TABLET | ORAL | 0 refills | Status: AC | PRN

## 2018-06-26 MED ORDER — THIAMINE HCL 100 MG PO TABS: 100.0000 mg | ORAL_TABLET | Freq: Every day | ORAL | 0 refills | Status: DC

## 2018-06-26 MED ORDER — FOLIC ACID 1 MG PO TABS: 1.0000 mg | ORAL_TABLET | Freq: Every day | ORAL | 0 refills | Status: DC

## 2018-06-26 MED ORDER — ADULT MULTIVITAMIN W/MINERALS CH: 1.0000 | ORAL_TABLET | Freq: Every day | ORAL | 0 refills | Status: DC

## 2018-06-26 MED ORDER — SENNOSIDES-DOCUSATE SODIUM 8.6-50 MG PO TABS: 1.0000 | ORAL_TABLET | Freq: Every evening | ORAL | 0 refills | Status: DC | PRN

## 2018-06-26 MED ORDER — FERROUS SULFATE 325 (65 FE) MG PO TABS: 325.0000 mg | ORAL_TABLET | Freq: Two times a day (BID) | ORAL | 3 refills | Status: DC

## 2018-06-26 NOTE — Progress Notes (Signed)
Physical Therapy Treatment Patient Details Name: David [name] MRN: [id number] DOB: [birth year] Today's Date: 06/26/2018    History of Present Illness Pt s/p R hip fx with cannulated pinning.  Pt with hx of ADD, back surgery and ETOH abuse    PT Comments    Pt with marked improvement in activity tolerance and balance.  Pt ambulated increased distance in hall and negotiated stairs with assist.  Pt eager for dc home and will benefit from follow up HHPT to improve safety and functioning in own home.   Follow Up Recommendations  Home health PT     Equipment Recommendations  Rolling walker with 5" wheels    Recommendations for Other Services OT consult     Precautions / Restrictions Precautions Precautions: Fall Restrictions Weight Bearing Restrictions: No RLE Weight Bearing: Weight bearing as tolerated    Mobility  Bed Mobility Overal bed mobility: Needs Assistance Bed Mobility: Supine to Sit     Supine to sit: Supervision     General bed mobility comments: Pt unassisted to sit at EOB  Transfers Overall transfer level: Needs assistance Equipment used: Rolling walker (2 wheeled) Transfers: Sit to/from Stand Sit to Stand: Min guard;Supervision         General transfer comment: cues for LE management and use of UEs to self assist.   Ambulation/Gait Ambulation/Gait assistance: Min guard;Supervision Gait Distance (Feet): 140 Feet Assistive device: Rolling walker (2 wheeled) Gait Pattern/deviations: Step-to pattern;Step-through pattern;Decreased step length - right;Decreased step length - left;Shuffle;Trunk flexed Gait velocity: decr   General Gait Details: cues for posture, position from RW and initial sequence   Stairs Stairs: Yes Stairs assistance: Min assist Stair Management: No rails;Step to pattern;Forwards;With walker Number of Stairs: 2 General stair comments: single step twice fwd with RW and cues for sequence and foot/RW placement   Wheelchair  Mobility    Modified Rankin (Stroke Patients Only)       Balance Overall balance assessment: Needs assistance Sitting-balance support: No upper extremity supported;Feet supported Sitting balance-Leahy Scale: Good     Standing balance support: No upper extremity supported Standing balance-Leahy Scale: Fair                              Cognition Arousal/Alertness: Awake/alert Behavior During Therapy: WFL for tasks assessed/performed Overall Cognitive Status: Within Functional Limits for tasks assessed                                 General Comments: Impulsive but cooperative      Exercises      General Comments        Pertinent Vitals/Pain Pain Assessment: 0-10 Pain Score: 6  Pain Location: R hip Pain Descriptors / Indicators: Aching;Sore Pain Intervention(s): Limited activity within patient's tolerance;Monitored during session;Premedicated before session;Ice applied    Home Living                      Prior Function            PT Goals (current goals can now be found in the care plan section) Acute Rehab PT Goals Patient Stated Goal: Home PT Goal Formulation: With patient Time For Goal Achievement: 07/02/18 Potential to Achieve Goals: Good Progress towards PT goals: Progressing toward goals    Frequency    Min 6X/week      PT Plan Current plan remains appropriate  Co-evaluation              AM-PAC PT "6 Clicks" Daily Activity  Outcome Measure  Difficulty turning over in bed (including adjusting bedclothes, sheets and blankets)?: A Lot Difficulty moving from lying on back to sitting on the side of the bed? : A Lot Difficulty sitting down on and standing up from a chair with arms (e.g., wheelchair, bedside commode, etc,.)?: A Lot Help needed moving to and from a bed to chair (including a wheelchair)?: A Little Help needed walking in hospital room?: A Little Help needed climbing 3-5 steps with a railing?  : A Little 6 Click Score: 15    End of Session         PT Visit Diagnosis: Unsteadiness on feet (R26.81);Difficulty in walking, not elsewhere classified (R26.2);Pain Pain - Right/Left: Right Pain - part of body: Hip     Time: 1137-1200 PT Time Calculation (min) (ACUTE ONLY): 23 min  Charges:  $Gait Training: 8-22 mins $Therapeutic Activity: 8-22 mins                     Debe Coder PT Acute Rehabilitation Services Pager [phone] Office [phone]    Alfonso Shackett 06/26/2018, 12:47 PM

## 2018-06-26 NOTE — Plan of Care (Signed)
  Problem: Clinical Measurements: Goal: Ability to maintain clinical measurements within normal limits will improve Outcome: Progressing Goal: Will remain free from infection Outcome: Progressing Goal: Diagnostic test results will improve Outcome: Progressing Goal: Respiratory complications will improve Outcome: Progressing   Problem: Coping: Goal: Level of anxiety will decrease Outcome: Progressing   Problem: Elimination: Goal: Will not experience complications related to bowel motility Outcome: Progressing   Problem: Pain Management: Goal: Pain level will decrease Outcome: Progressing   Carlynn Herald, RN 06/26/18 1:05 AM

## 2018-06-27 DIAGNOSIS — F909 Attention-deficit hyperactivity disorder, unspecified type: Secondary | ICD-10-CM | POA: Diagnosis not present

## 2018-06-27 DIAGNOSIS — Z9181 History of falling: Secondary | ICD-10-CM | POA: Diagnosis not present

## 2018-06-27 DIAGNOSIS — Z79891 Long term (current) use of opiate analgesic: Secondary | ICD-10-CM | POA: Diagnosis not present

## 2018-06-27 DIAGNOSIS — F1020 Alcohol dependence, uncomplicated: Secondary | ICD-10-CM | POA: Diagnosis not present

## 2018-06-27 DIAGNOSIS — S72001D Fracture of unspecified part of neck of right femur, subsequent encounter for closed fracture with routine healing: Secondary | ICD-10-CM | POA: Diagnosis not present

## 2018-06-27 DIAGNOSIS — Z4789 Encounter for other orthopedic aftercare: Secondary | ICD-10-CM | POA: Diagnosis not present

## 2018-06-27 DIAGNOSIS — I10 Essential (primary) hypertension: Secondary | ICD-10-CM | POA: Diagnosis not present

## 2018-06-27 DIAGNOSIS — F418 Other specified anxiety disorders: Secondary | ICD-10-CM | POA: Diagnosis not present

## 2018-06-27 NOTE — Discharge Summary (Signed)
Physician Discharge Summary  EHSAN CORVIN UEA:[id number] DOB: November 19, 1955 DOA: 06/23/2018  PCP: [name], MD  Admit date: 06/23/2018 Discharge date: 06/26/2018  Admitted From: Home.  Disposition:  Home.  Recommendations for Outpatient Follow-up:  1. Follow up with PCP in 1-2 weeks 2. Please obtain BMP/CBC in one week Please follow up with orthopedics as recommended.   Discharge Condition:stable.  CODE STATUS:full code.  Diet recommendation: Heart Healthy  Brief/Interim Summary:  62 year old male with prior h/o hypertension, ADD, alcohol abuse, depression, substance abuse, had a fall earlier, was unable to ambulate and was found to have impacted right femoral neck fracture. He was admitted for surgical repair of the hip.  Discharge Diagnoses:  Principal Problem:   Closed right hip fracture, initial encounter ([place]) Active Problems:   Alcohol abuse with alcohol-induced mood disorder ([place])   HTN (hypertension)   Hypertensive urgency   Depression with anxiety   Microcytic anemia  Right valgus impacted femoral neck  fracture:  Admitted for evaluation by orthopedics, underwent right cannulated hip pinning Pain control with oral meds today.  Physical therapy recommended home health PT.     Alcohol dependence:  Started the CIWA,  but no obvious signs of withdrawals. Pt adamant and denies any alcohol abuse at this time. He reports his last alcohol use was 2 weeks.  Continue with vitamin B1 and MVI.    Hypertensive urgency:  Improved. Currently on Iv hydralazine prn.    Depression with anxiety:  Resume lyrica and prozac.    Microcytic anemia:  Hemoglobin drop from 10 to 8 today after IV fluids.  Anemia panel shows normal iron levels, with low ferritin, low vitamin b12 levels.  Stool for occult blood pending, he denies any malena. Recommend outpatient follow up with cbc in one week at PCP office.  Supplementation with vitamin b12 and iron supplementation.      Leukocytosis:  Possibly reactive. Resolved with hydration.    Substance abuse:  UDS is positive for cocaine and benzos, watch for signs of withdrawal. Currently no signs of withdrawal.  Social work to be consulted for resources.     Discharge Instructions  Discharge Instructions    Diet - low sodium heart healthy   Complete by:  As directed    Discharge instructions   Complete by:  As directed    Follow up with orthopedics in 1 to 2 weeks as recommended.     Allergies as of 06/26/2018      Reactions   Codeine       Medication List    TAKE these medications   cloNIDine 0.3 MG tablet Commonly known as:  CATAPRES Take 0.3 mg by mouth 2 (two) times daily.   ferrous sulfate 325 (65 FE) MG tablet Take 1 tablet (325 mg total) by mouth 2 (two) times daily with a meal.   FLUoxetine 20 MG capsule Commonly known as:  PROZAC Take 20 mg by mouth daily.   folic acid 1 MG tablet Commonly known as:  FOLVITE Take 1 tablet (1 mg total) by mouth daily.   HYDROcodone-acetaminophen 5-325 MG tablet Commonly known as:  NORCO/VICODIN Take 2 tablets by mouth every 4 (four) hours as needed for up to 3 days for moderate pain.   hydrOXYzine 10 MG tablet Commonly known as:  ATARAX/VISTARIL Take 10 mg by mouth 3 (three) times daily as needed for anxiety.   multivitamin with minerals Tabs tablet Take 1 tablet by mouth daily.   pregabalin 150 MG capsule Commonly known  as:  LYRICA Take 150 mg by mouth 2 (two) times daily.   senna-docusate 8.6-50 MG tablet Commonly known as:  Senokot-S Take 1 tablet by mouth at bedtime as needed for mild constipation.   thiamine 100 MG tablet Take 1 tablet (100 mg total) by mouth daily.   zolpidem 10 MG tablet Commonly known as:  AMBIEN Take 10 mg by mouth at bedtime as needed for sleep.      Follow-up Information    Erle Crocker, MD Follow up.   Specialty:  Orthopedic Surgery Why:  Please schedule appt for 2 weeks from  surgery. Contact information: [place] [address] [phone]        [facility].   Specialty:  Home Health Services Why:  physical therapy Contact information: [place] [address] [phone]          Allergies  Allergen Reactions  . Codeine     Consultations:  Orthopedics.    Procedures/Studies: Dg Chest 2 View  Result Date: 06/23/2018 CLINICAL DATA:  Trip and fall EXAM: CHEST - 2 VIEW COMPARISON:  09/03/2012 FINDINGS: Cardiac shadow is stable. Lungs are well aerated bilaterally. Mild accentuation of the mediastinum and hila is noted related to patient rotation to the left. No focal infiltrate or effusion is seen. No acute bony abnormality is noted. IMPRESSION: No acute abnormality noted. Electronically Signed   By: Inez Catalina M.D.   On: 06/23/2018 18:56   Dg Pelvis 1-2 Views  Result Date: 06/23/2018 CLINICAL DATA:  Trip and fall this morning with pelvic pain, initial encounter EXAM: PELVIS - 1-2 VIEW COMPARISON:  None. FINDINGS: The pelvic ring is intact. There is a transverse fracture through the right femoral neck with some impaction at the fracture site. No other fractures are seen. IMPRESSION: Right femoral neck fracture with impaction at the fracture site. Electronically Signed   By: Inez Catalina M.D.   On: 06/23/2018 18:57   Ct Head Wo Contrast  Result Date: 06/23/2018 CLINICAL DATA:  Recent trip and fall with right femur fracture, headaches and neck pain EXAM: CT HEAD WITHOUT CONTRAST CT CERVICAL SPINE WITHOUT CONTRAST TECHNIQUE: Multidetector CT imaging of the head and cervical spine was performed following the standard protocol without intravenous contrast. Multiplanar CT image reconstructions of the cervical spine were also generated. COMPARISON:  None. FINDINGS: CT HEAD FINDINGS Brain: No evidence of acute infarction, hemorrhage, hydrocephalus, extra-axial collection or mass lesion/mass effect.  Vascular: No hyperdense vessel or unexpected calcification. Skull: Normal. Negative for fracture or focal lesion. Sinuses/Orbits: No acute finding. Other: None. CT CERVICAL SPINE FINDINGS Alignment: Mild loss of the normal cervical lordosis is noted. Skull base and vertebrae: 7 cervical segments are well visualized. Mild facet hypertrophic changes and osteophytic changes are seen. Anterolisthesis of C3 on C4 is seen of a degenerative nature. No acute fracture or acute facet abnormality is noted. Disc space narrowing is noted at C6-7 with associated osteophytes. Soft tissues and spinal canal: Mild vascular calcifications are noted. No soft tissue abnormality is seen. Upper chest: Visualized lung fields are within normal limits. Motion artifact limits the bony detail. Other: None IMPRESSION: CT of the head: No acute intracranial abnormality noted. CT of the cervical spine: Multilevel degenerative change without acute abnormality. Electronically Signed   By: Inez Catalina M.D.   On: 06/23/2018 19:03   Ct Cervical Spine Wo Contrast  Result Date: 06/23/2018 CLINICAL DATA:  Recent trip and fall with right femur  fracture, headaches and neck pain EXAM: CT HEAD WITHOUT CONTRAST CT CERVICAL SPINE WITHOUT CONTRAST TECHNIQUE: Multidetector CT imaging of the head and cervical spine was performed following the standard protocol without intravenous contrast. Multiplanar CT image reconstructions of the cervical spine were also generated. COMPARISON:  None. FINDINGS: CT HEAD FINDINGS Brain: No evidence of acute infarction, hemorrhage, hydrocephalus, extra-axial collection or mass lesion/mass effect. Vascular: No hyperdense vessel or unexpected calcification. Skull: Normal. Negative for fracture or focal lesion. Sinuses/Orbits: No acute finding. Other: None. CT CERVICAL SPINE FINDINGS Alignment: Mild loss of the normal cervical lordosis is noted. Skull base and vertebrae: 7 cervical segments are well visualized. Mild facet  hypertrophic changes and osteophytic changes are seen. Anterolisthesis of C3 on C4 is seen of a degenerative nature. No acute fracture or acute facet abnormality is noted. Disc space narrowing is noted at C6-7 with associated osteophytes. Soft tissues and spinal canal: Mild vascular calcifications are noted. No soft tissue abnormality is seen. Upper chest: Visualized lung fields are within normal limits. Motion artifact limits the bony detail. Other: None IMPRESSION: CT of the head: No acute intracranial abnormality noted. CT of the cervical spine: Multilevel degenerative change without acute abnormality. Electronically Signed   By: Inez Catalina M.D.   On: 06/23/2018 19:03   Dg C-arm 1-60 Min-no Report  Result Date: 06/24/2018 Fluoroscopy was utilized by the requesting physician.  No radiographic interpretation.   Dg Femur Min 2 Views Right  Result Date: 06/23/2018 CLINICAL DATA:  Recent trip and fall with right leg pain, initial encounter EXAM: RIGHT FEMUR 2 VIEWS COMPARISON:  None. FINDINGS: Right femoral neck fracture with impaction is again identified and stable. The remainder of the femur is within normal limits. No soft tissue changes are seen. IMPRESSION: Right femoral neck fracture with impaction at the fracture site. Electronically Signed   By: Inez Catalina M.D.   On: 06/23/2018 18:58       Subjective: No chest pain or sob.   Discharge Exam: Vitals:   06/25/18 2125 06/26/18 0513  BP: 122/72 (!) 97/59  Pulse: 72 (!) 51  Resp: 17 16  Temp: 97.6 F (36.4 C) (!) 97.3 F (36.3 C)  SpO2: 100% 97%   Vitals:   06/25/18 1327 06/25/18 1755 06/25/18 2125 06/26/18 0513  BP: 118/75 112/76 122/72 (!) 97/59  Pulse: (!) 54 62 72 (!) 51  Resp: 16 16 17 16   Temp: 98 F (36.7 C) 97.7 F (36.5 C) 97.6 F (36.4 C) (!) 97.3 F (36.3 C)  TempSrc: Oral Oral Oral Oral  SpO2: 100% 98% 100% 97%  Weight:      Height:        General: Pt is alert, awake, not in acute distress Cardiovascular:  RRR, S1/S2 +, no rubs, no gallops Respiratory: CTA bilaterally, no wheezing, no rhonchi Abdominal: Soft, NT, ND, bowel sounds + Extremities: no edema, no cyanosis    The results of significant diagnostics from this hospitalization (including imaging, microbiology, ancillary and laboratory) are listed below for reference.     Microbiology: Recent Results (from the past 240 hour(s))  Surgical PCR screen     Status: Abnormal   Collection Time: 06/24/18  3:00 AM  Result Value Ref Range Status   MRSA, PCR POSITIVE (A) NEGATIVE Final    Comment: RESULT CALLED TO, READ BACK BY AND VERIFIED WITH: TAYLOR,J. RN @0755  ON 10.10.19 BY NMCCOY    Staphylococcus aureus POSITIVE (A) NEGATIVE Final    Comment: (NOTE) The Xpert SA Assay (FDA  approved for NASAL specimens in patients 75 years of age and older), is one component of a comprehensive surveillance program. It is not intended to diagnose infection nor to guide or monitor treatment. Performed at [facility], [place] [address] [place], [place] [address]      Labs: BNP (last 3 results) No results for input(s): BNP in the last 8760 hours. Basic Metabolic Panel: Recent Labs  Lab 06/23/18 1803 06/24/18 0523 06/26/18 0359  NA 142 140 137  K 3.8 4.0 3.9  CL 106 109 104  CO2 27 26 26   GLUCOSE 101* 101* 120*  BUN 9 10 18   CREATININE 0.90 0.82 1.04  CALCIUM 9.2 8.3* 8.3*   Liver Function Tests: No results for input(s): AST, ALT, ALKPHOS, BILITOT, PROT, ALBUMIN in the last 168 hours. No results for input(s): LIPASE, AMYLASE in the last 168 hours. No results for input(s): AMMONIA in the last 168 hours. CBC: Recent Labs  Lab 06/23/18 1803 06/24/18 0523 06/25/18 1704  WBC 14.2* 9.8 13.9*  NEUTROABS 11.2*  --  10.1*  HGB 10.4* 8.7* 7.8*  HCT 35.5* 29.8* 26.7*  MCV 77.5* 77.6* 78.1*  PLT 429* 345 306   Cardiac Enzymes: No results for input(s): CKTOTAL, CKMB, CKMBINDEX, TROPONINI in the last 168  hours. BNP: Invalid input(s): POCBNP CBG: No results for input(s): GLUCAP in the last 168 hours. D-Dimer No results for input(s): DDIMER in the last 72 hours. Hgb A1c No results for input(s): HGBA1C in the last 72 hours. Lipid Profile No results for input(s): CHOL, HDL, LDLCALC, TRIG, CHOLHDL, LDLDIRECT in the last 72 hours. Thyroid function studies No results for input(s): TSH, T4TOTAL, T3FREE, THYROIDAB in the last 72 hours.  Invalid input(s): FREET3 Anemia work up No results for input(s): VITAMINB12, FOLATE, FERRITIN, TIBC, IRON, RETICCTPCT in the last 72 hours. Urinalysis    Component Value Date/Time   COLORURINE COLORLESS (A) 06/23/2018 1718   APPEARANCEUR CLEAR 06/23/2018 1718   LABSPEC 1.004 (L) 06/23/2018 1718   PHURINE 6.0 06/23/2018 1718   GLUCOSEU NEGATIVE 06/23/2018 1718   HGBUR NEGATIVE 06/23/2018 1718   BILIRUBINUR NEGATIVE 06/23/2018 1718   KETONESUR NEGATIVE 06/23/2018 1718   PROTEINUR NEGATIVE 06/23/2018 1718   UROBILINOGEN 0.2 04/14/2009 0446   NITRITE NEGATIVE 06/23/2018 1718   LEUKOCYTESUR NEGATIVE 06/23/2018 1718   Sepsis Labs Invalid input(s): PROCALCITONIN,  WBC,  LACTICIDVEN Microbiology Recent Results (from the past 240 hour(s))  Surgical PCR screen     Status: Abnormal   Collection Time: 06/24/18  3:00 AM  Result Value Ref Range Status   MRSA, PCR POSITIVE (A) NEGATIVE Final    Comment: RESULT CALLED TO, READ BACK BY AND VERIFIED WITH: TAYLOR,J. RN @0755  ON 10.10.19 BY NMCCOY    Staphylococcus aureus POSITIVE (A) NEGATIVE Final    Comment: (NOTE) The Xpert SA Assay (FDA approved for NASAL specimens in patients 18 years of age and older), is one component of a comprehensive surveillance program. It is not intended to diagnose infection nor to guide or monitor treatment. Performed at [facility], [place] [address], [place], [place] [address]      Time coordinating discharge: 85minutes  SIGNED:   Hosie Poisson,  MD  [facility] 06/27/2018, 5:55 PM Pager   If 7PM-7AM, please contact night-coverage [email or web address] Password [email or web address]

## 2018-07-05 ENCOUNTER — Encounter (HOSPITAL_COMMUNITY): Payer: Self-pay | Admitting: Emergency Medicine

## 2018-07-05 ENCOUNTER — Other Ambulatory Visit: Payer: Self-pay

## 2018-07-05 ENCOUNTER — Emergency Department (HOSPITAL_COMMUNITY)
Admission: EM | Admit: 2018-07-05 | Discharge: 2018-07-05 | Disposition: A | Discharge status: Home / Self Care | Payer: PPO | Attending: Emergency Medicine | Admitting: Emergency Medicine

## 2018-07-05 DIAGNOSIS — F17210 Nicotine dependence, cigarettes, uncomplicated: Secondary | ICD-10-CM | POA: Insufficient documentation

## 2018-07-05 DIAGNOSIS — R918 Other nonspecific abnormal finding of lung field: Secondary | ICD-10-CM | POA: Diagnosis not present

## 2018-07-05 DIAGNOSIS — I10 Essential (primary) hypertension: Secondary | ICD-10-CM | POA: Diagnosis not present

## 2018-07-05 DIAGNOSIS — R110 Nausea: Secondary | ICD-10-CM

## 2018-07-05 DIAGNOSIS — R5381 Other malaise: Secondary | ICD-10-CM | POA: Insufficient documentation

## 2018-07-05 LAB — CBC
HCT: 32.5 % — ABNORMAL LOW (ref 39.0–52.0)
Hemoglobin: 9.7 g/dL — ABNORMAL LOW (ref 13.0–17.0)
MCH: 23.2 pg — ABNORMAL LOW (ref 26.0–34.0)
MCHC: 29.8 g/dL — ABNORMAL LOW (ref 30.0–36.0)
MCV: 77.6 fL — ABNORMAL LOW (ref 80.0–100.0)
Platelets: 396 10*3/uL (ref 150–400)
RBC: 4.19 MIL/uL — ABNORMAL LOW (ref 4.22–5.81)
RDW: 21.5 % — ABNORMAL HIGH (ref 11.5–15.5)
WBC: 13.7 10*3/uL — ABNORMAL HIGH (ref 4.0–10.5)
nRBC: 0 % (ref 0.0–0.2)

## 2018-07-05 LAB — COMPREHENSIVE METABOLIC PANEL
ALT: 38 U/L (ref 0–44)
AST: 48 U/L — ABNORMAL HIGH (ref 15–41)
Albumin: 3.9 g/dL (ref 3.5–5.0)
Alkaline Phosphatase: 117 U/L (ref 38–126)
Anion gap: 10 (ref 5–15)
BUN: 13 mg/dL (ref 8–23)
CO2: 25 mmol/L (ref 22–32)
Calcium: 9.4 mg/dL (ref 8.9–10.3)
Chloride: 95 mmol/L — ABNORMAL LOW (ref 98–111)
Creatinine, Ser: 0.81 mg/dL (ref 0.61–1.24)
GFR calc Af Amer: >60 mL/min (ref >60)
GFR calc non Af Amer: >60 mL/min (ref >60)
Glucose, Bld: 120 mg/dL — ABNORMAL HIGH (ref 70–99)
Potassium: 3.6 mmol/L (ref 3.5–5.1)
Sodium: 130 mmol/L — ABNORMAL LOW (ref 135–145)
Total Bilirubin: 0.4 mg/dL (ref 0.3–1.2)
Total Protein: 8.4 g/dL — ABNORMAL HIGH (ref 6.5–8.1)

## 2018-07-05 LAB — URINALYSIS, ROUTINE W REFLEX MICROSCOPIC
Bilirubin Urine: NEGATIVE
Glucose, UA: NEGATIVE mg/dL
Hgb urine dipstick: NEGATIVE
Ketones, ur: NEGATIVE mg/dL
Leukocytes, UA: NEGATIVE
Nitrite: NEGATIVE
Protein, ur: NEGATIVE mg/dL
Specific Gravity, Urine: 1.011 (ref 1.005–1.030)
pH: 6 (ref 5.0–8.0)

## 2018-07-05 LAB — LIPASE, BLOOD: Lipase: 26 U/L (ref 11–51)

## 2018-07-05 MED ORDER — SODIUM CHLORIDE 0.9 % IV SOLN
INTRAVENOUS | Status: DC
  Administered 2018-07-05: 10:00:00 via INTRAVENOUS

## 2018-07-05 MED ORDER — OXYCODONE-ACETAMINOPHEN 5-325 MG PO TABS
1.0000 | ORAL_TABLET | Freq: Once | ORAL | Status: AC
  Administered 2018-07-05: 1 via ORAL
  Filled 2018-07-05: qty 1

## 2018-07-05 MED ORDER — ONDANSETRON HCL 4 MG/2ML IJ SOLN
4.0000 mg | Freq: Once | INTRAMUSCULAR | Status: AC
  Administered 2018-07-05: 4 mg via INTRAVENOUS
  Filled 2018-07-05: qty 2

## 2018-07-05 MED ORDER — ONDANSETRON HCL 4 MG/2ML IJ SOLN
4.0000 mg | Freq: Once | INTRAMUSCULAR | Status: AC | PRN
  Administered 2018-07-05: 4 mg via INTRAVENOUS
  Filled 2018-07-05: qty 2

## 2018-07-05 MED ORDER — ONDANSETRON HCL 4 MG PO TABS: 4.0000 mg | ORAL_TABLET | Freq: Four times a day (QID) | ORAL | 0 refills | Status: DC | PRN

## 2018-07-05 NOTE — Progress Notes (Signed)
CSW acknowledges consult for additional home health needs. CSW has reached out to [facility] to assist with further home health needs.  At this time there are no additional CSW needs. CSW will sign off.    Virgie Dad. Aibhlinn Kalmar, MSW, [facility] Clinical Social Worker [phone]

## 2018-07-05 NOTE — ED Provider Notes (Signed)
[facility] DEPT Provider Note   CSN: [id number] Arrival date & time: 07/05/18  0932     History   Chief Complaint Chief Complaint  Patient presents with  . Nausea    HPI David [name] is a 62 y.o. male.  HPI   45yM coming from home for nausea for the past two. No vomiting.  No abdominal pain.  He reports regular bowel movements.  Multiple other vague complaints. Says he has "not been doing well" since discharge from the hospital a little bit over a week ago after having right hip surgery.  He states that he anticipated he would be feeling better than he has by now and that he would be getting around better. He has been using a walker. He lives with his wife but he says he cannot physically assist him much. He has not seen his orthopedist yet since discharge.   Past Medical History:  Diagnosis Date  . ADD (attention deficit disorder)   . Depression   . HTN (hypertension)     Patient Active Problem List   Diagnosis Date Noted  . Closed right hip fracture, initial encounter ([place]) 06/23/2018  . HTN (hypertension) 06/23/2018  . Hypertensive urgency 06/23/2018  . Depression with anxiety 06/23/2018  . Microcytic anemia 06/23/2018  . Alcohol abuse with alcohol-induced mood disorder ([place]) 03/16/2018    Past Surgical History:  Procedure Laterality Date  . BACK SURGERY    . HIP PINNING,CANNULATED Right 06/24/2018   Procedure: RIGHT CANNULATED HIP PINNING;  Surgeon: Erle Crocker, MD;  Location: [facility] ORS;  Service: Orthopedics;  Laterality: Right;        Home Medications    Prior to Admission medications   Medication Sig Start Date End Date Taking? Authorizing Provider  cloNIDine (CATAPRES) 0.3 MG tablet Take 0.3 mg by mouth 2 (two) times daily.     [provider]  ferrous sulfate 325 (65 FE) MG tablet Take 1 tablet (325 mg total) by mouth 2 (two) times daily with a meal. 06/26/18   Hosie Poisson, MD  FLUoxetine (PROZAC) 20  MG capsule Take 20 mg by mouth daily.     [provider]  folic acid (FOLVITE) 1 MG tablet Take 1 tablet (1 mg total) by mouth daily. 06/27/18   Hosie Poisson, MD  hydrOXYzine (ATARAX/VISTARIL) 10 MG tablet Take 10 mg by mouth 3 (three) times daily as needed for anxiety.     [provider]  Multiple Vitamin (MULTIVITAMIN WITH MINERALS) TABS tablet Take 1 tablet by mouth daily. 06/27/18   Hosie Poisson, MD  pregabalin (LYRICA) 150 MG capsule Take 150 mg by mouth 2 (two) times daily.     [provider]  senna-docusate (SENOKOT-S) 8.6-50 MG tablet Take 1 tablet by mouth at bedtime as needed for mild constipation. 06/26/18   Hosie Poisson, MD  thiamine 100 MG tablet Take 1 tablet (100 mg total) by mouth daily. 06/27/18   Hosie Poisson, MD  zolpidem (AMBIEN) 10 MG tablet Take 10 mg by mouth at bedtime as needed for sleep.     [provider]    Family History Family History  Problem Relation Age of Onset  . Arthritis Unknown   . Heart disease Unknown   . Cancer Unknown   . Hypertension Unknown     Social History Social History   Tobacco Use  . Smoking status: Current Every Day Smoker    Packs/day: 0.50    Years: 25.00  Pack years: 12.50  . Smokeless tobacco: Never Used  Substance Use Topics  . Alcohol use: Yes    Comment: daily  . Drug use: Yes    Types: Marijuana     Allergies   Codeine   Review of Systems Review of Systems  All systems reviewed and negative, other than as noted in HPI.  Physical Exam Updated Vital Signs BP (!) 168/105 (BP Location: Left Arm) Comment: Simultaneous filing. User may not have seen previous data.  Pulse 70   Temp 98.4 F (36.9 C) (Oral)   Resp 15   Ht 5\' 8"  (1.727 m)   Wt 79.4 kg   SpO2 100%   BMI 26.61 kg/m   Physical Exam  Constitutional: He appears well-developed and well-nourished. No distress.  HENT:  Head: Normocephalic and atraumatic.  Eyes: Conjunctivae are normal. Right eye  exhibits no discharge. Left eye exhibits no discharge.  Neck: Neck supple.  Cardiovascular: Normal rate, regular rhythm and normal heart sounds. Exam reveals no gallop and no friction rub.  No murmur heard. Pulmonary/Chest: Effort normal and breath sounds normal. No respiratory distress.  Abdominal: Soft. He exhibits no distension. There is no tenderness.  Musculoskeletal:  Surgical site R lateral hip/thigh appear to healing well. Can actively range although with some discomfort.   Neurological: He is alert.  Skin: Skin is warm and dry.  Psychiatric: He has a normal mood and affect. His behavior is normal. Thought content normal.  Nursing note and vitals reviewed.    ED Treatments / Results  Labs (all labs ordered are listed, but only abnormal results are displayed) Labs Reviewed  COMPREHENSIVE METABOLIC PANEL - Abnormal; Notable for the following components:      Result Value   Sodium 130 (*)    Chloride 95 (*)    Glucose, Bld 120 (*)    Total Protein 8.4 (*)    AST 48 (*)    All other components within normal limits  CBC - Abnormal; Notable for the following components:   WBC 13.7 (*)    RBC 4.19 (*)    Hemoglobin 9.7 (*)    HCT 32.5 (*)    MCV 77.6 (*)    MCH 23.2 (*)    MCHC 29.8 (*)    RDW 21.5 (*)    All other components within normal limits  LIPASE, BLOOD  URINALYSIS, ROUTINE W REFLEX MICROSCOPIC    EKG EKG Interpretation  Date/Time:  Monday July 05 2018 09:49:30 EDT Ventricular Rate:  67 PR Interval:    QRS Duration: 101 QT Interval:  413 QTC Calculation: 436 R Axis:   70 Text Interpretation:  Sinus rhythm Confirmed by Virgel Manifold [phone]) on 07/05/2018 10:19:15 AM   Radiology No results found.  Procedures Procedures (including critical care time)  Medications Ordered in ED Medications  ondansetron (ZOFRAN) injection 4 mg (4 mg Intravenous Given 07/05/18 0950)     Initial Impression / Assessment and Plan / ED Course  I have reviewed the  triage vital signs and the nursing notes.  Pertinent labs & imaging results that were available during my care of the patient were reviewed by me and considered in my medical decision making (see chart for details).     62 year old male with multiple vague complaints.  Nausea without vomiting or abdominal pain.  His abdominal exam is benign.  Overall his exam is reassuring.  Externally his hip appears to be healing well.  I get an overall sense though that he has had  a difficult time recovering at home.  He has a rolling walker.  Case management note mentions [facility] on discharge although patient tells me that he has not been getting any help.  Anemia improving. Mild hyponatremia but I don't think is significantly contributing. Case management involved.  [facility] involved in care previously.  Additional orders placed for additional home services.  Final Clinical Impressions(s) / ED Diagnoses   Final diagnoses:  Nausea  Physical deconditioning    ED Discharge Orders    None       Virgel Manifold, MD 07/05/18 1550

## 2018-07-05 NOTE — ED Notes (Signed)
Bed: YV85 Expected date:  Expected time:  Means of arrival:  Comments: EMS 62 yo nausea

## 2018-07-05 NOTE — Care Management Note (Signed)
Case Management Note  Patient Details  Name: RUSELL MENEELY MRN: [id number] Date of Birth: [birth year]  CM consulted for an increase in [facility] services.  Pt is active with [facility].  Spoke with Dr. [place] Singer who advised he feels the pt is likely presenting in the ED today due to lack of needed care at home s/p hip replacement D/C on 10/12.  Follow up information placed on AVS.  CM contacted Ronalee Belts with [facility] to advise pt was in the ED and increasing [facility] services to RN PT OT NA SW with possible need for SNF rehab placement.  Ronalee Belts is aware of pt's needs.  No further CM need noted at this time.  Expected Discharge Date:    07/05/2018              Expected Discharge Plan:  [place]  Discharge planning Services  CM Consult  Post Acute Care Choice:  Home Health Choice offered to:  NA  HH Arranged:  RN, PT, OT, Nurse's Aide, Social Work [employer] Agency:  [facility] [facility] [employer]  Status of Service:  Completed, signed off  Mackay Hanauer, Benjaman Lobe, RN 07/05/2018, 10:36 AM

## 2018-07-05 NOTE — ED Notes (Signed)
Patient given discharge teaching and verbalized understanding. Patient was advised to use PTAR because of fractured femur. Patient refused to take PTAR. Patient was taken with wheelchair to main lobby to wait on significant other.

## 2018-07-05 NOTE — ED Triage Notes (Signed)
Pt arrived by EMS from home. Pt c/o nausea that has started last night. Pt has femur fracture and taking pain medication for pain. BP 154/92, HR 85, RR 18, CBG 141.

## 2018-07-06 ENCOUNTER — Other Ambulatory Visit: Payer: Self-pay | Admitting: Psychiatry

## 2018-07-07 ENCOUNTER — Other Ambulatory Visit: Payer: Self-pay | Admitting: Psychiatry

## 2018-07-10 ENCOUNTER — Other Ambulatory Visit: Payer: Self-pay | Admitting: Psychiatry

## 2018-07-12 ENCOUNTER — Other Ambulatory Visit: Payer: Self-pay

## 2018-07-12 DIAGNOSIS — S72041D Displaced fracture of base of neck of right femur, subsequent encounter for closed fracture with routine healing: Secondary | ICD-10-CM | POA: Diagnosis not present

## 2018-07-12 MED ORDER — CLONIDINE HCL 0.3 MG PO TABS: 0.3000 mg | ORAL_TABLET | Freq: Two times a day (BID) | ORAL | 2 refills | Status: DC

## 2018-07-30 DIAGNOSIS — K122 Cellulitis and abscess of mouth: Secondary | ICD-10-CM | POA: Diagnosis not present

## 2018-07-31 ENCOUNTER — Emergency Department (HOSPITAL_BASED_OUTPATIENT_CLINIC_OR_DEPARTMENT_OTHER)
Admission: EM | Admit: 2018-07-31 | Discharge: 2018-08-01 | Disposition: A | Discharge status: Home / Self Care | Payer: PPO | Attending: Emergency Medicine | Admitting: Emergency Medicine

## 2018-07-31 ENCOUNTER — Emergency Department (HOSPITAL_BASED_OUTPATIENT_CLINIC_OR_DEPARTMENT_OTHER): Payer: PPO

## 2018-07-31 ENCOUNTER — Encounter (HOSPITAL_BASED_OUTPATIENT_CLINIC_OR_DEPARTMENT_OTHER): Payer: Self-pay | Admitting: Emergency Medicine

## 2018-07-31 ENCOUNTER — Other Ambulatory Visit: Payer: Self-pay

## 2018-07-31 DIAGNOSIS — F17210 Nicotine dependence, cigarettes, uncomplicated: Secondary | ICD-10-CM | POA: Insufficient documentation

## 2018-07-31 DIAGNOSIS — F1210 Cannabis abuse, uncomplicated: Secondary | ICD-10-CM | POA: Diagnosis not present

## 2018-07-31 DIAGNOSIS — M869 Osteomyelitis, unspecified: Secondary | ICD-10-CM | POA: Diagnosis not present

## 2018-07-31 DIAGNOSIS — I10 Essential (primary) hypertension: Secondary | ICD-10-CM | POA: Insufficient documentation

## 2018-07-31 DIAGNOSIS — L0291 Cutaneous abscess, unspecified: Secondary | ICD-10-CM | POA: Diagnosis present

## 2018-07-31 DIAGNOSIS — K047 Periapical abscess without sinus: Secondary | ICD-10-CM | POA: Insufficient documentation

## 2018-07-31 DIAGNOSIS — Z79899 Other long term (current) drug therapy: Secondary | ICD-10-CM | POA: Diagnosis not present

## 2018-07-31 DIAGNOSIS — R220 Localized swelling, mass and lump, head: Secondary | ICD-10-CM | POA: Diagnosis not present

## 2018-07-31 DIAGNOSIS — M272 Inflammatory conditions of jaws: Secondary | ICD-10-CM | POA: Diagnosis not present

## 2018-07-31 LAB — CBC WITH DIFFERENTIAL/PLATELET
Abs Immature Granulocytes: 0.03 10*3/uL (ref 0.00–0.07)
BASOS ABS: 0.1 10*3/uL (ref 0.0–0.1)
BASOS PCT: 1 %
EOS PCT: 1 %
Eosinophils Absolute: 0.1 10*3/uL (ref 0.0–0.5)
HCT: 32.7 % — ABNORMAL LOW (ref 39.0–52.0)
HEMOGLOBIN: 9.9 g/dL — AB (ref 13.0–17.0)
Immature Granulocytes: 0 %
Lymphocytes Relative: 23 %
Lymphs Abs: 2.5 10*3/uL (ref 0.7–4.0)
MCH: 23.6 pg — ABNORMAL LOW (ref 26.0–34.0)
MCHC: 30.3 g/dL (ref 30.0–36.0)
MCV: 77.9 fL — ABNORMAL LOW (ref 80.0–100.0)
Monocytes Absolute: 1.1 10*3/uL — ABNORMAL HIGH (ref 0.1–1.0)
Monocytes Relative: 10 %
NRBC: 0 % (ref 0.0–0.2)
Neutro Abs: 7.1 10*3/uL (ref 1.7–7.7)
Neutrophils Relative %: 65 %
PLATELETS: 337 10*3/uL (ref 150–400)
RBC: 4.2 MIL/uL — AB (ref 4.22–5.81)
RDW: 19.3 % — AB (ref 11.5–15.5)
WBC: 10.8 10*3/uL — AB (ref 4.0–10.5)

## 2018-07-31 LAB — HEPATIC FUNCTION PANEL
ALK PHOS: 128 U/L — AB (ref 38–126)
ALT: 11 U/L (ref 0–44)
AST: 16 U/L (ref 15–41)
Albumin: 4 g/dL (ref 3.5–5.0)
BILIRUBIN INDIRECT: 0.3 mg/dL (ref 0.3–0.9)
BILIRUBIN TOTAL: 0.4 mg/dL (ref 0.3–1.2)
Bilirubin, Direct: 0.1 mg/dL (ref 0.0–0.2)
Total Protein: 8.3 g/dL — ABNORMAL HIGH (ref 6.5–8.1)

## 2018-07-31 LAB — BASIC METABOLIC PANEL
Anion gap: 10 (ref 5–15)
BUN: 12 mg/dL (ref 8–23)
CO2: 24 mmol/L (ref 22–32)
CREATININE: 1.02 mg/dL (ref 0.61–1.24)
Calcium: 9.5 mg/dL (ref 8.9–10.3)
Chloride: 100 mmol/L (ref 98–111)
Glucose, Bld: 90 mg/dL (ref 70–99)
POTASSIUM: 4.6 mmol/L (ref 3.5–5.1)
SODIUM: 134 mmol/L — AB (ref 135–145)

## 2018-07-31 MED ORDER — LORAZEPAM 1 MG PO TABS
1.0000 mg | ORAL_TABLET | Freq: Once | ORAL | Status: AC
  Administered 2018-07-31: 1 mg via ORAL
  Filled 2018-07-31: qty 1

## 2018-07-31 MED ORDER — SODIUM CHLORIDE 0.9 % IV SOLN
INTRAVENOUS | Status: DC | PRN
  Administered 2018-07-31: 500 mL via INTRAVENOUS

## 2018-07-31 MED ORDER — IOPAMIDOL (ISOVUE-300) INJECTION 61%
100.0000 mL | Freq: Once | INTRAVENOUS | Status: AC | PRN
  Administered 2018-07-31: 80 mL via INTRAVENOUS

## 2018-07-31 MED ORDER — AMOXICILLIN-POT CLAVULANATE 875-125 MG PO TABS: 1.0000 | ORAL_TABLET | Freq: Two times a day (BID) | ORAL | 0 refills | Status: AC

## 2018-07-31 MED ORDER — FENTANYL CITRATE (PF) 100 MCG/2ML IJ SOLN
25.0000 ug | Freq: Once | INTRAMUSCULAR | Status: AC
  Administered 2018-07-31: 25 ug via INTRAVENOUS
  Filled 2018-07-31: qty 2

## 2018-07-31 MED ORDER — SODIUM CHLORIDE 0.9 % IV SOLN
3.0000 g | Freq: Once | INTRAVENOUS | Status: AC
  Administered 2018-07-31: 3 g via INTRAVENOUS
  Filled 2018-07-31: qty 3

## 2018-07-31 NOTE — ED Notes (Signed)
Called [facility] to consult with oral surgeon on call.

## 2018-07-31 NOTE — ED Triage Notes (Signed)
Patient states that he has had pain to his left mouth since yesterday  - patient now has swelling to his left upper jaw - was at the urgent care yesterday and it is worse today

## 2018-07-31 NOTE — ED Notes (Signed)
ED Provider at bedside. Dr. Ronnald Nian and [place], [place]

## 2018-07-31 NOTE — Discharge Instructions (Addendum)
I have prescribed antibiotics to treat the infection to your maxilla, please take one tablet twice a day for the next 7 days. I have provided a referral to Oral surgeons please make an appointment to have your teeth removed.

## 2018-07-31 NOTE — ED Provider Notes (Signed)
[facility] EMERGENCY DEPARTMENT Provider Note   CSN: [id number] Arrival date & time: 07/31/18  1714     History   Chief Complaint Chief Complaint  Patient presents with  . Abscess    HPI David [name] is a 62 y.o. male.  62 y.o male with a PMH of HTN, ADD, Depression presents to the ED with a chief complaint of abscess x 1 week. Patient was see at [facility] yesterday and given a shot of antibiotics along with a prescription for amoxicillin. He reports taking the antibiotic this morning but noting the abscess getting bigger. Patient reports having difficulty medication along with talking.  He denies any terms tolerating his own secretions, swallowing, fever or other complaints.     Past Medical History:  Diagnosis Date  . ADD (attention deficit disorder)   . Depression   . HTN (hypertension)     Patient Active Problem List   Diagnosis Date Noted  . Closed right hip fracture, initial encounter ([place]) 06/23/2018  . HTN (hypertension) 06/23/2018  . Hypertensive urgency 06/23/2018  . Depression with anxiety 06/23/2018  . Microcytic anemia 06/23/2018  . Alcohol abuse with alcohol-induced mood disorder ([place]) 03/16/2018    Past Surgical History:  Procedure Laterality Date  . BACK SURGERY    . HIP PINNING,CANNULATED Right 06/24/2018   Procedure: RIGHT CANNULATED HIP PINNING;  Surgeon: Erle Crocker, MD;  Location: [facility];  Service: Orthopedics;  Laterality: Right;        Home Medications    Prior to Admission medications   Medication Sig Start Date End Date Taking? Authorizing Provider  amoxicillin-clavulanate (AUGMENTIN) 875-125 MG tablet Take 1 tablet by mouth every 12 (twelve) hours for 7 days. 07/31/18 08/07/18  Janeece Fitting, PA-C  amphetamine-dextroamphetamine (ADDERALL) 30 MG tablet Take 30 mg by mouth See admin instructions. Take 30 mg by mouth in the morning, 30 mg at lunch and 15 mg at 4 pm 06/28/18   [provider]  cloNIDine  (CATAPRES) 0.3 MG tablet Take 1 tablet (0.3 mg total) by mouth 2 (two) times daily. 07/12/18   Cottle, Billey Co., MD  ferrous sulfate 325 (65 FE) MG tablet Take 1 tablet (325 mg total) by mouth 2 (two) times daily with a meal. 06/26/18   Hosie Poisson, MD  FLUoxetine (PROZAC) 20 MG capsule Take 20 mg by mouth daily.     [provider]  folic acid (FOLVITE) 1 MG tablet Take 1 tablet (1 mg total) by mouth daily. 06/27/18   Hosie Poisson, MD  HYDROcodone-acetaminophen (NORCO/VICODIN) 5-325 MG tablet Take 1 tablet by mouth every 6 (six) hours as needed. 06/29/18   [provider]  Multiple Vitamin (MULTIVITAMIN WITH MINERALS) TABS tablet Take 1 tablet by mouth daily. 06/27/18   Hosie Poisson, MD  ondansetron (ZOFRAN) 4 MG tablet Take 1 tablet (4 mg total) by mouth every 6 (six) hours as needed for nausea or vomiting. 07/05/18   Virgel Manifold, MD  pregabalin (LYRICA) 150 MG capsule Take 150 mg by mouth 2 (two) times daily.     [provider]  senna-docusate (SENOKOT-S) 8.6-50 MG tablet Take 1 tablet by mouth at bedtime as needed for mild constipation. 06/26/18   Hosie Poisson, MD  thiamine 100 MG tablet Take 1 tablet (100 mg total) by mouth daily. 06/27/18   Hosie Poisson, MD  zolpidem (AMBIEN) 10 MG tablet Take 10 mg by mouth at bedtime as needed for sleep.     [provider]  Family History Family History  Problem Relation Age of Onset  . Arthritis Unknown   . Heart disease Unknown   . Cancer Unknown   . Hypertension Unknown     Social History Social History   Tobacco Use  . Smoking status: Current Every Day Smoker    Packs/day: 0.50    Years: 25.00    Pack years: 12.50  . Smokeless tobacco: Never Used  Substance Use Topics  . Alcohol use: Yes    Comment: daily  . Drug use: Yes    Types: Marijuana     Allergies   Codeine   Review of Systems Review of Systems  Constitutional: Negative for chills and fever.  HENT: Positive for  dental problem and sinus pressure. Negative for facial swelling, trouble swallowing and voice change.   Respiratory: Negative for chest tightness.   Cardiovascular: Negative for chest pain.  Gastrointestinal: Negative for abdominal pain, nausea and vomiting.  Genitourinary: Negative for flank pain.  Musculoskeletal: Negative for back pain.  Skin: Negative for pallor and wound.  Neurological: Negative for light-headedness and headaches.     Physical Exam Updated Vital Signs BP 136/72 (BP Location: Left Arm)   Pulse 78   Temp 98.8 F (37.1 C) (Oral)   Resp 16   Ht 5\' 8"  (1.727 m)   Wt 72.6 kg   SpO2 100%   BMI 24.33 kg/m   Physical Exam  Constitutional: He is oriented to person, place, and time. He appears well-developed and well-nourished.  HENT:  Head: Normocephalic and atraumatic.  Mouth/Throat: Uvula is midline and oropharynx is clear and moist. No lacerations. No posterior oropharyngeal edema or posterior oropharyngeal erythema.    Large indurated, warm abscess noted to the hard palate.   Eyes: Pupils are equal, round, and reactive to light. No scleral icterus.  Neck: Normal range of motion.  Cardiovascular: Normal heart sounds.  Pulmonary/Chest: Effort normal and breath sounds normal. He has no wheezes. He exhibits no tenderness.  Abdominal: Soft. Bowel sounds are normal. He exhibits no distension. There is no tenderness.  Musculoskeletal: He exhibits no tenderness or deformity.  Neurological: He is alert and oriented to person, place, and time.  Skin: Skin is warm and dry.  Nursing note and vitals reviewed.    ED Treatments / Results  Labs (all labs ordered are listed, but only abnormal results are displayed) Labs Reviewed  BASIC METABOLIC PANEL - Abnormal; Notable for the following components:      Result Value   Sodium 134 (*)    All other components within normal limits  CBC WITH DIFFERENTIAL/PLATELET - Abnormal; Notable for the following components:   WBC  10.8 (*)    RBC 4.20 (*)    Hemoglobin 9.9 (*)    HCT 32.7 (*)    MCV 77.9 (*)    MCH 23.6 (*)    RDW 19.3 (*)    Monocytes Absolute 1.1 (*)    All other components within normal limits  HEPATIC FUNCTION PANEL - Abnormal; Notable for the following components:   Total Protein 8.3 (*)    Alkaline Phosphatase 128 (*)    All other components within normal limits    EKG None  Radiology Ct Maxillofacial W Contrast  Result Date: 07/31/2018 CLINICAL DATA:  62 year old male with left perioral soft tissue swelling, possible abscess. EXAM: CT MAXILLOFACIAL WITH CONTRAST TECHNIQUE: Multidetector CT imaging of the maxillofacial structures was performed with intravenous contrast. Multiplanar CT image reconstructions were also generated. CONTRAST:  24mL ISOVUE-300  IOPAMIDOL (ISOVUE-300) INJECTION 61% COMPARISON:  Head and cervical spine CT 06/23/2018. FINDINGS: Osseous: Carious dentition throughout. The mandible is intact with no areas of periapical cortical breakthrough. There is an area of cortical breakthrough at the left maxillary anterior bicuspid on series 3, image 34 and coronal image 23. Prominent bone resorption there also along the confluence with the hard palate (same image). And there is associated subperiosteal abscess along both the external and internal surfaces of the maxilla. The larger abscess is internal along the superior left palate measuring about 10 millimeters in diameter. See series 2, image 35 and series 6, image 26. A smaller 4-5 millimeter subperiosteal abscess is present externally. Overlying asymmetric left upper lip soft tissue swelling and stranding, occurring near the left nasal labial fold. No soft tissue gas. Chronic appearing left nasal bone fracture. No other No acute osseous abnormality identified. Partially visible advanced cervical spine degeneration which was better demonstrated on the October CT. Orbits: Intact orbital walls. Negative orbit soft tissues. Sinuses:  Stable and well pneumatized. Hyperplastic sinuses (normal variant). Soft tissues: Abnormal left perioral soft tissues described above. Negative visible larynx, parapharyngeal spaces, retropharyngeal space, sublingual space, submandibular spaces, parotid spaces, and masticator spaces. There are postinflammatory dystrophic calcifications of the palatine tonsils. No lymphadenopathy in the superior neck. Major vascular structures in the upper neck and at the skull base are patent. There is moderate to severe bilateral carotid atherosclerosis which could result in hemodynamically significant stenosis of the proximal right ICA (series 2, image 17). Limited intracranial: Stable and negative. IMPRESSION: 1. 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 - osteolysis at the confluence of the left maxilla and hard palate, series 8, image 23. - larger left palate region 10 mm soft tissue abscess (series 6, image 26), and smaller superficial subperiosteal abscess deep to the left upper lip. 2. Moderate to severe bilateral carotid atherosclerosis appears hemodynamically significant on the right. Follow-up carotid Doppler ultrasound may be valuable. Electronically Signed   By: Genevie Ann M.D.   On: 07/31/2018 19:52    Procedures Procedures (including critical care time)  Medications Ordered in ED Medications  Ampicillin-Sulbactam (UNASYN) 3 g in sodium chloride 0.9 % 100 mL IVPB (3 g Intravenous New Bag/Given 07/31/18 2313)  0.9 %  sodium chloride infusion (500 mLs Intravenous New Bag/Given 07/31/18 2312)  iopamidol (ISOVUE-300) 61 % injection 100 mL (80 mLs Intravenous Contrast Given 07/31/18 1923)  fentaNYL (SUBLIMAZE) injection 25 mcg (25 mcg Intravenous Given 07/31/18 2112)  LORazepam (ATIVAN) tablet 1 mg (1 mg Oral Given 07/31/18 2322)     Initial Impression / Assessment and Plan / ED Course   I have reviewed the triage vital signs and the nursing notes.  Pertinent labs & imaging results that were available during my care of the patient were reviewed by me and considered in my medical decision making (see chart for details).  Patient with dental abscess after seen in urgent care yesterday and sent home with a p.o. prescription for amoxicillin.  Patient reports his abscess is worsening.   7:30PM Spoke to Dr. Redmond Baseman who reports, "you are calling the wrong doctor", due to no OMFS or dental coverage will place call for [facility].   8:49 PM Spoke to Dr. Joellen Jersey at [facility] who reports they are unable to give recommendation due to their policy being if an abscess is less than 2cm. Will placed call out for peds dentistry.  9:58 PM Spoke to Dr. Doroteo Glassman who advised patient needs to be seen by oral surgery, he recommended [facility]. 10:13 PM spoke to [place] who recommended patient receive IV antibiotics unasyn along with a PO prescription for Augmentin. Patient ultimately needs removal of his teeth.  Will provide patient with IV unasyn and a prescription for Augmentin.  Vitals stable during ED visit, patient stable for discharge.   Patient requesting something for his nerves, will provide him with PO ativan.  Final Clinical Impressions(s) / ED Diagnoses   Final diagnoses:  Osteomyelitis of maxilla  Dental abscess    ED Discharge Orders         Ordered    amoxicillin-clavulanate (AUGMENTIN) 875-125 MG tablet  Every 12 hours     07/31/18 2305           Janeece Fitting, PA-C 07/31/18 [place], [place], DO 07/31/18 2354

## 2018-07-31 NOTE — ED Notes (Signed)
Provider notified of pt's c/o pain

## 2018-08-01 NOTE — ED Notes (Signed)
Pt d/c home with ride. Verbalized understanding that Rx have been electronically prescribed to pharmacy on paperwork. Dental Resource Guide provided for f/u

## 2018-08-02 ENCOUNTER — Telehealth (HOSPITAL_BASED_OUTPATIENT_CLINIC_OR_DEPARTMENT_OTHER): Payer: Self-pay | Admitting: Emergency Medicine

## 2018-08-03 ENCOUNTER — Encounter: Payer: Self-pay | Admitting: Emergency Medicine

## 2018-08-03 DIAGNOSIS — F411 Generalized anxiety disorder: Secondary | ICD-10-CM | POA: Insufficient documentation

## 2018-08-03 DIAGNOSIS — F988 Other specified behavioral and emotional disorders with onset usually occurring in childhood and adolescence: Secondary | ICD-10-CM

## 2018-08-28 ENCOUNTER — Encounter (HOSPITAL_COMMUNITY): Payer: Self-pay | Admitting: Emergency Medicine

## 2018-08-28 ENCOUNTER — Emergency Department (HOSPITAL_COMMUNITY): Payer: PPO

## 2018-08-28 ENCOUNTER — Emergency Department (HOSPITAL_COMMUNITY)
Admission: EM | Admit: 2018-08-28 | Discharge: 2018-08-28 | Disposition: A | Discharge status: Home / Self Care | Payer: PPO | Attending: Emergency Medicine | Admitting: Emergency Medicine

## 2018-08-28 ENCOUNTER — Other Ambulatory Visit: Payer: Self-pay

## 2018-08-28 DIAGNOSIS — I10 Essential (primary) hypertension: Secondary | ICD-10-CM | POA: Insufficient documentation

## 2018-08-28 DIAGNOSIS — R110 Nausea: Secondary | ICD-10-CM | POA: Insufficient documentation

## 2018-08-28 DIAGNOSIS — R4182 Altered mental status, unspecified: Secondary | ICD-10-CM | POA: Diagnosis not present

## 2018-08-28 DIAGNOSIS — F909 Attention-deficit hyperactivity disorder, unspecified type: Secondary | ICD-10-CM | POA: Insufficient documentation

## 2018-08-28 DIAGNOSIS — R112 Nausea with vomiting, unspecified: Secondary | ICD-10-CM | POA: Diagnosis not present

## 2018-08-28 DIAGNOSIS — Z79899 Other long term (current) drug therapy: Secondary | ICD-10-CM | POA: Diagnosis not present

## 2018-08-28 DIAGNOSIS — F17210 Nicotine dependence, cigarettes, uncomplicated: Secondary | ICD-10-CM | POA: Diagnosis not present

## 2018-08-28 DIAGNOSIS — R531 Weakness: Secondary | ICD-10-CM | POA: Diagnosis not present

## 2018-08-28 LAB — CBC WITH DIFFERENTIAL/PLATELET
Abs Immature Granulocytes: 0.02 10*3/uL (ref 0.00–0.07)
BASOS ABS: 0.1 10*3/uL (ref 0.0–0.1)
Basophils Relative: 1 %
EOS PCT: 2 %
Eosinophils Absolute: 0.1 10*3/uL (ref 0.0–0.5)
HEMATOCRIT: 35.1 % — AB (ref 39.0–52.0)
HEMOGLOBIN: 10.6 g/dL — AB (ref 13.0–17.0)
Immature Granulocytes: 0 %
LYMPHS ABS: 2.4 10*3/uL (ref 0.7–4.0)
Lymphocytes Relative: 34 %
MCH: 23.9 pg — AB (ref 26.0–34.0)
MCHC: 30.2 g/dL (ref 30.0–36.0)
MCV: 79.1 fL — ABNORMAL LOW (ref 80.0–100.0)
MONO ABS: 0.7 10*3/uL (ref 0.1–1.0)
Monocytes Relative: 10 %
NRBC: 0 % (ref 0.0–0.2)
Neutro Abs: 3.8 10*3/uL (ref 1.7–7.7)
Neutrophils Relative %: 53 %
Platelets: 333 10*3/uL (ref 150–400)
RBC: 4.44 MIL/uL (ref 4.22–5.81)
RDW: 20.2 % — AB (ref 11.5–15.5)
WBC: 7.2 10*3/uL (ref 4.0–10.5)

## 2018-08-28 LAB — URINALYSIS, ROUTINE W REFLEX MICROSCOPIC
BILIRUBIN URINE: NEGATIVE
GLUCOSE, UA: NEGATIVE mg/dL
HGB URINE DIPSTICK: NEGATIVE
KETONES UR: NEGATIVE mg/dL
Leukocytes, UA: NEGATIVE
NITRITE: NEGATIVE
PH: 6 (ref 5.0–8.0)
Protein, ur: NEGATIVE mg/dL
SPECIFIC GRAVITY, URINE: 1.012 (ref 1.005–1.030)

## 2018-08-28 LAB — BASIC METABOLIC PANEL
Anion gap: 11 (ref 5–15)
BUN: 5 mg/dL — AB (ref 8–23)
CHLORIDE: 102 mmol/L (ref 98–111)
CO2: 25 mmol/L (ref 22–32)
CREATININE: 0.9 mg/dL (ref 0.61–1.24)
Calcium: 9.2 mg/dL (ref 8.9–10.3)
GFR calc non Af Amer: >60 mL/min (ref >60)
Glucose, Bld: 103 mg/dL — ABNORMAL HIGH (ref 70–99)
Potassium: 4 mmol/L (ref 3.5–5.1)
Sodium: 138 mmol/L (ref 135–145)

## 2018-08-28 LAB — RAPID URINE DRUG SCREEN, HOSP PERFORMED
Amphetamines: NOT DETECTED
BARBITURATES: NOT DETECTED
Benzodiazepines: POSITIVE — AB
COCAINE: NOT DETECTED
Opiates: NOT DETECTED
TETRAHYDROCANNABINOL: POSITIVE — AB

## 2018-08-28 LAB — I-STAT TROPONIN, ED: TROPONIN I, POC: 0.01 ng/mL (ref 0.00–0.08)

## 2018-08-28 MED ORDER — ONDANSETRON HCL 4 MG PO TABS
4.0000 mg | ORAL_TABLET | Freq: Once | ORAL | Status: AC
  Administered 2018-08-28: 4 mg via ORAL
  Filled 2018-08-28: qty 1

## 2018-08-28 MED ORDER — SODIUM CHLORIDE 0.9 % IV BOLUS
1000.0000 mL | Freq: Once | INTRAVENOUS | Status: AC
  Administered 2018-08-28: 1000 mL via INTRAVENOUS

## 2018-08-28 MED ORDER — ONDANSETRON HCL 4 MG PO TABS: 4.0000 mg | ORAL_TABLET | Freq: Four times a day (QID) | ORAL | 0 refills | Status: DC

## 2018-08-28 MED ORDER — CLONIDINE HCL 0.1 MG PO TABS
0.1000 mg | ORAL_TABLET | Freq: Once | ORAL | Status: AC
  Administered 2018-08-28: 0.1 mg via ORAL
  Filled 2018-08-28: qty 1

## 2018-08-28 MED ORDER — ONDANSETRON HCL 4 MG/2ML IJ SOLN
4.0000 mg | Freq: Once | INTRAMUSCULAR | Status: AC
  Administered 2018-08-28: 4 mg via INTRAVENOUS
  Filled 2018-08-28: qty 2

## 2018-08-28 NOTE — ED Notes (Signed)
Patient transported to MRI 

## 2018-08-28 NOTE — ED Notes (Signed)
Pt to CT

## 2018-08-28 NOTE — Discharge Instructions (Addendum)
Follow-up with a primary care provider within the next week for continued evaluation.  Take Zofran as needed for nausea.  Return to the ED immediately for new or worsening symptoms, such as vomiting, abdominal pain, fevers, numbness, weakness or any concerns at all.

## 2018-08-28 NOTE — Care Management (Addendum)
Discussed patient's living situation with him.  Pt states since his femur fracture, he has not been able to function independently at home and experiencing frequent falls.  He is home alone a lot because his wife works.  Pt states he had HH PT for a while but it was not helpful and he doesn't think it will help now.  Pt states he would be open to go to SNF and wishes he would've gone after his femur fracture.  SNF choices would be limited due to patient's only payor being Medicaid.  Pt has a cane at home but states it does not help.    Pt hopes to be admitted due to nausea, abdominal pain, and weakness.    DME is not available at this time.  It will be difficult to place patient in a SNF due to Medicaid as payor and probably not available from the ED.  Home health can be arranged, even after discharge, if [facility] orders/Face to Face are placed.    Patient may be eligible for a personal care assistant through Medicaid.  Advised patient to follow up with DSS about this, but [facility] CSW may be able to assist.    MD, please place order for PT, OT and CSW if discharge and HH is the disposition.  Also, orders for any DME that would help (rolling walker, 3n1 bedside commode, wheelchair) can be placed.  These may be obtained through [facility], but patient and wife will have to obtain from [place] store on Monday.  If wheelchair is ordered, a progress note similar to narrative in order should also be entered by MD or PT to state patient's need.     CM will follow up tomorrow.

## 2018-08-28 NOTE — ED Notes (Signed)
Pt reports unable to give urine sample

## 2018-08-28 NOTE — ED Provider Notes (Signed)
Patient care was signed out to follow-up MRI of the head result.  MRI results reviewed no acute findings.  Blood pressure improved in the ER.  Patient had high blood pressure and nausea.  Work-up in the ED did not reveal any indication for admission or emergent consult.  Patient stable for outpatient follow-up.  Golda Acre, MD 08/28/18 [phone]

## 2018-08-28 NOTE — ED Triage Notes (Signed)
Per [facility]- Pt picked up from home reports nausea and just not feeling well x2 days.  Denies taking blood pressure medication

## 2018-08-28 NOTE — ED Provider Notes (Signed)
David [name]   CSN: [id number] Arrival date & time: 08/28/18  1203     History   Chief Complaint Chief Complaint  Patient presents with  . Hypertension  . Nausea    HPI David [name] is a 62 y.o. male.  HPI  62 year old male, with a PMH of HTN, presents with not feeling well for 2 days.  He notes associated nausea.  Wife at bedside states patient has had a gradual decline over months.  Patient notes generalized weakness but denies any focal deficits.  He denies any visual changes, numbness, tingling.  Patient denies hitting his head, LOC, syncope.  Patient denies any nausea, vomiting, chest pain, shortness of breath, abdominal pain.  He denies any known fevers.  When asked to explain I does not feel he states "I just do not feel good."  He has been noncompliant with all of his medications at home.  He states he just has not taken them.    Past Medical History:  Diagnosis Date  . ADD (attention deficit disorder)   . Depression   . HTN (hypertension)     Patient Active Problem List   Diagnosis Date Noted  . GAD (generalized anxiety disorder) 08/03/2018  . ADD (attention deficit disorder) 08/03/2018  . Closed right hip fracture, initial encounter ([place]) 06/23/2018  . HTN (hypertension) 06/23/2018  . Hypertensive urgency 06/23/2018  . Depression with anxiety 06/23/2018  . Microcytic anemia 06/23/2018  . Alcohol abuse with alcohol-induced mood disorder ([place]) 03/16/2018    Past Surgical History:  Procedure Laterality Date  . BACK SURGERY    . HIP PINNING,CANNULATED Right 06/24/2018   Procedure: RIGHT CANNULATED HIP PINNING;  Surgeon: Erle Crocker, MD;  Location: [facility];  Service: Orthopedics;  Laterality: Right;        Home Medications    Prior to Admission medications   Medication Sig Start Date End Date Taking? Authorizing Provider  amphetamine-dextroamphetamine (ADDERALL) 30 MG tablet Take 30 mg by  mouth See admin instructions. Take 30 mg by mouth in the morning, 30 mg at lunch and 15 mg at 4 pm 06/28/18   [provider]  cloNIDine (CATAPRES) 0.3 MG tablet Take 1 tablet (0.3 mg total) by mouth 2 (two) times daily. 07/12/18   Cottle, Billey Co., MD  ferrous sulfate 325 (65 FE) MG tablet Take 1 tablet (325 mg total) by mouth 2 (two) times daily with a meal. 06/26/18   Hosie Poisson, MD  FLUoxetine (PROZAC) 20 MG capsule Take 20 mg by mouth daily.     [provider]  folic acid (FOLVITE) 1 MG tablet Take 1 tablet (1 mg total) by mouth daily. 06/27/18   Hosie Poisson, MD  HYDROcodone-acetaminophen (NORCO/VICODIN) 5-325 MG tablet Take 1 tablet by mouth every 6 (six) hours as needed. 06/29/18   [provider]  Multiple Vitamin (MULTIVITAMIN WITH MINERALS) TABS tablet Take 1 tablet by mouth daily. 06/27/18   Hosie Poisson, MD  ondansetron (ZOFRAN) 4 MG tablet Take 1 tablet (4 mg total) by mouth every 6 (six) hours as needed for nausea or vomiting. 07/05/18   Virgel Manifold, MD  pregabalin (LYRICA) 150 MG capsule Take 150 mg by mouth 2 (two) times daily.     [provider]  QUEtiapine (SEROQUEL) 25 MG tablet Take 25 mg by mouth at bedtime. 1-2 qhs 08/02/18   [provider]  senna-docusate (SENOKOT-S) 8.6-50 MG tablet Take 1 tablet by mouth at bedtime as  needed for mild constipation. 06/26/18   Hosie Poisson, MD  thiamine 100 MG tablet Take 1 tablet (100 mg total) by mouth daily. 06/27/18   Hosie Poisson, MD  zolpidem (AMBIEN) 10 MG tablet Take 10 mg by mouth at bedtime as needed for sleep.     [provider]    Family History Family History  Problem Relation Age of Onset  . Arthritis Other   . Heart disease Other   . Cancer Other   . Hypertension Other     Social History Social History   Tobacco Use  . Smoking status: Current Every Day Smoker    Packs/day: 0.50    Years: 25.00    Pack years: 12.50  . Smokeless tobacco: Never  Used  Substance Use Topics  . Alcohol use: Yes    Comment: daily  . Drug use: Yes    Types: Marijuana     Allergies   Codeine   Review of Systems Review of Systems  Constitutional: Negative for chills and fever.  HENT: Negative for rhinorrhea and sore throat.   Eyes: Negative for visual disturbance.  Respiratory: Negative for cough and shortness of breath.   Cardiovascular: Negative for chest pain and leg swelling.  Gastrointestinal: Positive for nausea. Negative for abdominal pain, diarrhea and vomiting.  Genitourinary: Negative for dysuria, frequency and urgency.  Musculoskeletal: Negative for joint swelling and neck pain.  Skin: Negative for rash and wound.  Neurological: Positive for weakness (generalized). Negative for syncope and numbness.  All other systems reviewed and are negative.    Physical Exam Updated Vital Signs BP (!) 212/112 Comment: MANUAL  Pulse 91   Temp 98.7 F (37.1 C) (Oral)   Resp 18   Ht 5\' 8"  (1.727 m)   Wt 72.5 kg   SpO2 97%   BMI 24.30 kg/m   Physical Exam Vitals signs and nursing [name] reviewed.  Constitutional:      Appearance: He is well-developed.  HENT:     Head: Normocephalic and atraumatic.  Eyes:     Conjunctiva/sclera: Conjunctivae normal.  Neck:     Musculoskeletal: Neck supple.  Cardiovascular:     Rate and Rhythm: Normal rate and regular rhythm.     Heart sounds: Normal heart sounds. No murmur.  Pulmonary:     Effort: Pulmonary effort is normal. No respiratory distress.     Breath sounds: Normal breath sounds. No wheezing or rales.  Abdominal:     General: Bowel sounds are normal. There is no distension.     Palpations: Abdomen is soft.     Tenderness: There is no abdominal tenderness.  Musculoskeletal: Normal range of motion.        General: No tenderness or deformity.  Skin:    General: Skin is warm and dry.     Findings: No erythema or rash.  Neurological:     Mental Status: He is alert and oriented to  person, place, and time.     GCS: GCS eye subscore is 4. GCS verbal subscore is 5. GCS motor subscore is 6.     Cranial Nerves: Cranial nerves are intact.     Sensory: Sensation is intact.     Motor: Motor function is intact.  Psychiatric:        Behavior: Behavior normal.      ED Treatments / Results  Labs (all labs ordered are listed, but only abnormal results are displayed) Labs Reviewed  CBC WITH DIFFERENTIAL/PLATELET - Abnormal; Notable for the following components:  Result Value   Hemoglobin 10.6 (*)    HCT 35.1 (*)    MCV 79.1 (*)    MCH 23.9 (*)    RDW 20.2 (*)    All other components within normal limits  BASIC METABOLIC PANEL - Abnormal; Notable for the following components:   Glucose, Bld 103 (*)    BUN 5 (*)    All other components within normal limits  URINALYSIS, ROUTINE W REFLEX MICROSCOPIC  RAPID URINE DRUG SCREEN, HOSP PERFORMED  I-STAT TROPONIN, ED    EKG EKG Interpretation  Date/Time:  Saturday August 28 2018 12:10:37 EST Ventricular Rate:  85 PR Interval:    QRS Duration: 92 QT Interval:  372 QTC Calculation: 443 R Axis:   76 Text Interpretation:  Sinus rhythm No significant change since last tracing Confirmed by Wandra Arthurs [phone]) on 08/28/2018 1:27:08 PM   Radiology Dg Chest Port 1 View  Result Date: 08/28/2018 CLINICAL DATA:  Nausea, vomiting, and weakness for 2 days, history hypertension EXAM: PORTABLE CHEST 1 VIEW COMPARISON:  Portable exam 1235 hours compared to 06/23/2018 FINDINGS: Normal heart size, mediastinal contours, and pulmonary vascularity. Lungs clear. No acute infiltrate, pleural effusion or pneumothorax. Old healed fracture of the posterior RIGHT sixth rib. IMPRESSION: No acute abnormalities. Electronically Signed   By: Lavonia Dana M.D.   On: 08/28/2018 13:20    Procedures Procedures (including critical care time)  Medications Ordered in ED Medications  sodium chloride 0.9 % bolus 1,000 mL (1,000 mLs Intravenous  New Bag/Given 08/28/18 1240)  ondansetron (ZOFRAN) injection 4 mg (4 mg Intravenous Given 08/28/18 1315)  cloNIDine (CATAPRES) tablet 0.1 mg (0.1 mg Oral Given 08/28/18 1312)     Initial Impression / Assessment and Plan / ED Course  I have reviewed the triage vital signs and the nursing notes.  Pertinent labs & imaging results that were available during my care of the patient were reviewed by me and considered in my medical decision making (see chart for details).     Patient presents with vague complaints of nausea not feeling well.  He has been noncompliant with his blood pressure medication and all of his home regimens.  EMR, patient had similar presentation to today's back in August.  These complaints seem to be ongoing, with no significant change.  Wife notes a gradual decline, no significant change today.  Patient's blood pressure has responded appropriately to his home regimen.  Offered refills of patient's home blood pressure medicine.  He states he has plenty medication at home and will start taking it again.  He is feeling better after a liter of fluids.  He has had no vomiting in the ED.  He continues to deny any focal deficits, chest pain, shortness of breath.  Blood work stable, chest x-ray with no acute findings.  EKG with no acute ST changes.  Encourage close follow-up with primary care.  Given strict return precautions.  Patient ready and stable for discharge.    At this time there does not appear to be any evidence of an acute emergency medical condition and the patient appears stable for discharge with appropriate outpatient follow up.Diagnosis was discussed with patient who verbalizes understanding and is agreeable to discharge. Pt case discussed with Dr. Darl Householder who agrees with my plan.   Final Clinical Impressions(s) / ED Diagnoses   Final diagnoses:  None    ED Discharge Orders    None       Etter Sjogren, PA-C 08/29/18 1154  Drenda Freeze, MD 08/31/18  1146

## 2018-09-01 ENCOUNTER — Encounter: Payer: Self-pay | Admitting: Psychiatry

## 2018-09-01 ENCOUNTER — Ambulatory Visit (INDEPENDENT_AMBULATORY_CARE_PROVIDER_SITE_OTHER): Payer: PPO | Admitting: Psychiatry

## 2018-09-01 VITALS — BP 173/98 | HR 117

## 2018-09-01 DIAGNOSIS — F5105 Insomnia due to other mental disorder: Secondary | ICD-10-CM | POA: Diagnosis not present

## 2018-09-01 DIAGNOSIS — F411 Generalized anxiety disorder: Secondary | ICD-10-CM

## 2018-09-01 DIAGNOSIS — F1011 Alcohol abuse, in remission: Secondary | ICD-10-CM | POA: Diagnosis not present

## 2018-09-01 DIAGNOSIS — F902 Attention-deficit hyperactivity disorder, combined type: Secondary | ICD-10-CM | POA: Diagnosis not present

## 2018-09-01 DIAGNOSIS — I10 Essential (primary) hypertension: Secondary | ICD-10-CM | POA: Diagnosis not present

## 2018-09-01 MED ORDER — AMPHETAMINE-DEXTROAMPHETAMINE 30 MG PO TABS: ORAL_TABLET | ORAL | 0 refills | Status: DC

## 2018-09-01 NOTE — Progress Notes (Signed)
[name] [id number] 1956-04-29 62 y.o.  Subjective:   Patient ID:  David [name] is a 62 y.o. (DOB [birth year]) male.  Chief Complaint:  Chief Complaint  Patient presents with  . Follow-up    Medication Management    HPI ZIGGY CHANTHAVONG presents to the office today for follow-up of anxiety depression and ADD.  Had accident and fractured leg Oct 9 and had to have surgery.  Has a lot of pain.  Using a cane. Has stopped cocaine and alcohol since then.  Doesn't feel he can tolerate it anymore.    Stressed by the reduced level of funciton.  Some depression.  Review of Systems:  Review of Systems  Musculoskeletal: Positive for arthralgias, back pain and gait problem.  Psychiatric/Behavioral: Positive for decreased concentration, dysphoric mood and sleep disturbance. Negative for agitation, behavioral problems, confusion, hallucinations, self-injury and suicidal ideas. The patient is nervous/anxious. The patient is not hyperactive.     Medications: I have reviewed the patient's current medications.  Current Outpatient Medications  Medication Sig Dispense Refill  . amphetamine-dextroamphetamine (ADDERALL) 30 MG tablet 1 in AM and Noon and 1/2 tab at 4pm 75 tablet 0  . cloNIDine (CATAPRES) 0.3 MG tablet Take 1 tablet (0.3 mg total) by mouth 2 (two) times daily. 180 tablet 2  . ferrous sulfate 325 (65 FE) MG tablet Take 1 tablet (325 mg total) by mouth 2 (two) times daily with a meal. 60 tablet 3  . FLUoxetine (PROZAC) 20 MG capsule Take 40 mg by mouth daily.     . folic acid (FOLVITE) 1 MG tablet Take 1 tablet (1 mg total) by mouth daily. 30 tablet 0  . HYDROcodone-acetaminophen (NORCO/VICODIN) 5-325 MG tablet Take 1 tablet by mouth every 6 (six) hours as needed.  0  . Multiple Vitamin (MULTIVITAMIN WITH MINERALS) TABS tablet Take 1 tablet by mouth daily. 30 tablet 0  . ondansetron (ZOFRAN) 4 MG tablet Take 1 tablet (4 mg total) by mouth every 6 (six) hours. 12 tablet 0  .  pregabalin (LYRICA) 150 MG capsule Take 150 mg by mouth 2 (two) times daily.     . QUEtiapine (SEROQUEL) 25 MG tablet Take 25 mg by mouth at bedtime. 1-2 qhs    . thiamine 100 MG tablet Take 1 tablet (100 mg total) by mouth daily. 30 tablet 0  . zolpidem (AMBIEN) 10 MG tablet Take 15 mg by mouth at bedtime as needed for sleep.     Derrill Memo ON 09/29/2018] amphetamine-dextroamphetamine (ADDERALL) 30 MG tablet 1 in AM, 1 at noon, and 1/2 at 4PM 75 tablet 0  . [START ON 10/27/2018] amphetamine-dextroamphetamine (ADDERALL) 30 MG tablet 1 in AM, 1 at noon, and 1/2 at 4PM 75 tablet 0  . senna-docusate (SENOKOT-S) 8.6-50 MG tablet Take 1 tablet by mouth at bedtime as needed for mild constipation. (Patient not taking: Reported on 09/01/2018) 30 tablet 0   No current facility-administered medications for this visit.     Medication Side Effects: None  Allergies:  Allergies  Allergen Reactions  . Codeine Nausea Only    Past Medical History:  Diagnosis Date  . ADD (attention deficit disorder)   . Depression   . HTN (hypertension)     Family History  Problem Relation Age of Onset  . Arthritis Other   . Heart disease Other   . Cancer Other   . Hypertension Other     Social History   Socioeconomic History  . Marital status: Married  Spouse name: Not on file  . Number of children: Not on file  . Years of education: Not on file  . Highest education level: Not on file  Occupational History  . Not on file  Social Needs  . Financial resource strain: Somewhat hard  . Food insecurity:    Worry: Never true    Inability: Never true  . Transportation needs:    Medical: No    Non-medical: No  Tobacco Use  . Smoking status: Current Every Day Smoker    Packs/day: 0.50    Years: 25.00    Pack years: 12.50  . Smokeless tobacco: Never Used  Substance and Sexual Activity  . Alcohol use: Yes    Comment: daily  . Drug use: Yes    Types: Marijuana  . Sexual activity: Not on file   Lifestyle  . Physical activity:    Days per week: 4 days    Minutes per session: 30 min  . Stress: Patient refused  Relationships  . Social connections:    Talks on phone: Three times a week    Gets together: Three times a week    Attends religious service: 1 to 4 times per year    Active member of club or organization: No    Attends meetings of clubs or organizations: Never    Relationship status: Married  . Intimate partner violence:    Fear of current or ex partner: Not on file    Emotionally abused: Not on file    Physically abused: Not on file    Forced sexual activity: Not on file  Other Topics Concern  . Not on file  Social History Narrative  . Not on file    Past Medical History, Surgical history, Social history, and Family history were reviewed and updated as appropriate.   Please see review of systems for further details on the patient's review from today.   Objective:   Physical Exam:  BP (!) 173/98 (BP Location: Right Arm)   Pulse (!) 117   Physical Exam Constitutional:      Appearance: Normal appearance.  Neurological:     Mental Status: He is alert.     Motor: No tremor.     Gait: Gait abnormal.  Psychiatric:        Attention and Perception: He is inattentive.        Mood and Affect: Mood is anxious and depressed. Affect is labile.        Speech: Speech normal.        Behavior: Behavior is hyperactive. Behavior is not agitated or slowed.        Thought Content: Thought content is not paranoid. Thought content does not include homicidal or suicidal ideation.        Cognition and Memory: Cognition normal.     Comments: Fair insight and judgment. Chronically scattered. Not hopeless.     Lab Review:     Component Value Date/Time   NA 138 08/28/2018 1224   K 4.0 08/28/2018 1224   CL 102 08/28/2018 1224   CO2 25 08/28/2018 1224   GLUCOSE 103 (H) 08/28/2018 1224   BUN 5 (L) 08/28/2018 1224   CREATININE 0.90 08/28/2018 1224   CREATININE TEST NOT  PERFORMED 09/03/2012 1527   CALCIUM 9.2 08/28/2018 1224   PROT 8.3 (H) 07/31/2018 1850   ALBUMIN 4.0 07/31/2018 1850   AST 16 07/31/2018 1850   ALT 11 07/31/2018 1850   ALKPHOS 128 (H) 07/31/2018 1850  BILITOT 0.4 07/31/2018 1850   GFRNONAA >60 08/28/2018 1224   GFRNONAA TEST NOT PERFORMED 09/03/2012 1527   GFRAA >60 08/28/2018 1224   GFRAA TEST NOT PERFORMED 09/03/2012 1527       Component Value Date/Time   WBC 7.2 08/28/2018 1224   RBC 4.44 08/28/2018 1224   HGB 10.6 (L) 08/28/2018 1224   HCT 35.1 (L) 08/28/2018 1224   PLT 333 08/28/2018 1224   MCV 79.1 (L) 08/28/2018 1224   MCH 23.9 (L) 08/28/2018 1224   MCHC 30.2 08/28/2018 1224   RDW 20.2 (H) 08/28/2018 1224   LYMPHSABS 2.4 08/28/2018 1224   MONOABS 0.7 08/28/2018 1224   EOSABS 0.1 08/28/2018 1224   BASOSABS 0.1 08/28/2018 1224    No results found for: POCLITH, LITHIUM   No results found for: PHENYTOIN, PHENOBARB, VALPROATE, CBMZ   .res Assessment: Plan:    Attention deficit hyperactivity disorder (ADHD), combined type - Plan: amphetamine-dextroamphetamine (ADDERALL) 30 MG tablet, amphetamine-dextroamphetamine (ADDERALL) 30 MG tablet, amphetamine-dextroamphetamine (ADDERALL) 30 MG tablet  Generalized anxiety disorder  Alcohol use disorder, mild, in early remission, abuse  Essential hypertension  Insomnia due to mental condition   Greater than 50% of face to face time with patient was spent on counseling and coordination of care. We discussed that Ray has been chronically disabled by severe ADD and some depression and anxiety.  He is chronically scattered and distracted even on the stimulant at the high dosage.  Symptoms are worsened by persistent pain after his fractured hip.  Get back on clonidine bc blood pressure is high.  Clonidine is used both for blood pressure and off label for anxiety.  Supportive therapy with focus on self care and substance abuse focus.  Maintain sobriety.  Discussed potential  benefits, risks, and side effects of stimulants with patient to include increased heart rate, palpitations, insomnia, increased anxiety, increased irritability, or decreased appetite.  Instructed patient to contact office if experiencing any significant tolerability issues.  Cont meds Prozac for depression and anxiety and Adderall for ADD and clonidine for anxiety and hypertension and Lyrica for chronic pain and off label for anxiety.  Also Ambien for sleep   This appt was 30 mins.  Fu 3 mos  Lynder Parents, MD, DFAPA   Please see After Visit Summary for patient specific instructions.  Future Appointments  Date Time Provider [place]  12/01/2018  2:15 PM Cottle, Billey Co., MD [facility] None    No orders of the defined types were placed in this encounter.     -------------------------------

## 2018-09-16 ENCOUNTER — Telehealth: Payer: Self-pay | Admitting: Psychiatry

## 2018-09-16 ENCOUNTER — Other Ambulatory Visit: Payer: Self-pay | Admitting: Psychiatry

## 2018-09-16 MED ORDER — VARENICLINE TARTRATE 1 MG PO TABS: ORAL_TABLET | ORAL | 4 refills | Status: DC

## 2018-09-16 NOTE — Telephone Encounter (Signed)
He and Dr. [name] discussed starting Chiantix after the holidays. He does want it sent in to the pharmacy [facility]  [facility] : [address] [place].

## 2018-09-16 NOTE — Progress Notes (Signed)
As discussed before he would like to try Chantix for smoking cessation.  We have discussed the risks involved and he agrees..  Prescription sent in to [facility] for 1 month and 3 refills  Lynder Parents, MD, DFAPA

## 2018-09-26 ENCOUNTER — Other Ambulatory Visit: Payer: Self-pay | Admitting: Psychiatry

## 2018-09-29 DIAGNOSIS — S72041D Displaced fracture of base of neck of right femur, subsequent encounter for closed fracture with routine healing: Secondary | ICD-10-CM | POA: Diagnosis not present

## 2018-09-30 ENCOUNTER — Telehealth: Payer: Self-pay | Admitting: Psychiatry

## 2018-09-30 ENCOUNTER — Other Ambulatory Visit: Payer: Self-pay | Admitting: Orthopaedic Surgery

## 2018-09-30 DIAGNOSIS — S72041K Displaced fracture of base of neck of right femur, subsequent encounter for closed fracture with nonunion: Secondary | ICD-10-CM

## 2018-09-30 MED ORDER — ZOLPIDEM TARTRATE 10 MG PO TABS: 15.0000 mg | ORAL_TABLET | Freq: Every evening | ORAL | 2 refills | Status: DC | PRN

## 2018-09-30 NOTE — Telephone Encounter (Signed)
Pt. Called and said that he needs a refill on his ambien escribed to [facility] at [address] [phone]

## 2018-10-15 ENCOUNTER — Inpatient Hospital Stay: Admission: RE | Admit: 2018-10-15 | Payer: Self-pay | Source: Ambulatory Visit

## 2018-10-19 ENCOUNTER — Ambulatory Visit
Admission: RE | Admit: 2018-10-19 | Discharge: 2018-10-19 | Disposition: A | Discharge status: Home / Self Care | Payer: PPO | Source: Ambulatory Visit | Attending: Orthopaedic Surgery | Admitting: Orthopaedic Surgery

## 2018-10-19 DIAGNOSIS — S72041K Displaced fracture of base of neck of right femur, subsequent encounter for closed fracture with nonunion: Secondary | ICD-10-CM

## 2018-10-19 DIAGNOSIS — S72011D Unspecified intracapsular fracture of right femur, subsequent encounter for closed fracture with routine healing: Secondary | ICD-10-CM | POA: Diagnosis not present

## 2018-10-20 ENCOUNTER — Other Ambulatory Visit: Payer: Self-pay

## 2018-10-20 MED ORDER — HYDROXYZINE HCL 10 MG PO TABS: 10.0000 mg | ORAL_TABLET | Freq: Three times a day (TID) | ORAL | 3 refills | Status: DC | PRN

## 2018-10-27 DIAGNOSIS — S72041A Displaced fracture of base of neck of right femur, initial encounter for closed fracture: Secondary | ICD-10-CM | POA: Diagnosis not present

## 2018-10-30 DIAGNOSIS — M25551 Pain in right hip: Secondary | ICD-10-CM | POA: Diagnosis not present

## 2018-11-09 ENCOUNTER — Other Ambulatory Visit: Payer: Self-pay | Admitting: Orthopedic Surgery

## 2018-11-23 ENCOUNTER — Telehealth: Payer: Self-pay | Admitting: Psychiatry

## 2018-11-23 ENCOUNTER — Other Ambulatory Visit: Payer: Self-pay

## 2018-11-23 DIAGNOSIS — F902 Attention-deficit hyperactivity disorder, combined type: Secondary | ICD-10-CM

## 2018-11-23 MED ORDER — AMPHETAMINE-DEXTROAMPHETAMINE 30 MG PO TABS: ORAL_TABLET | ORAL | 0 refills | Status: DC

## 2018-11-23 NOTE — Telephone Encounter (Signed)
rx submitted for provider to approve

## 2018-11-23 NOTE — Telephone Encounter (Signed)
David [name] called to request refill on his Adderall.  Please send to [facility].  Next appt 12/01/18

## 2018-12-01 ENCOUNTER — Ambulatory Visit: Payer: PPO | Admitting: Psychiatry

## 2018-12-14 ENCOUNTER — Telehealth: Payer: Self-pay | Admitting: Psychiatry

## 2018-12-14 NOTE — Telephone Encounter (Signed)
Pt asks that we refill Lyrica for him. Pharmacy told him to contact us.  [facility] on [address]

## 2018-12-15 ENCOUNTER — Other Ambulatory Visit: Payer: Self-pay | Admitting: Psychiatry

## 2018-12-15 MED ORDER — PREGABALIN 150 MG PO CAPS: 150.0000 mg | ORAL_CAPSULE | Freq: Two times a day (BID) | ORAL | 3 refills | Status: DC

## 2018-12-15 NOTE — Telephone Encounter (Signed)
Last fill 11/02/2018

## 2018-12-15 NOTE — Progress Notes (Signed)
Lyrica refills sent #4

## 2018-12-15 NOTE — Telephone Encounter (Signed)
No show 12/01/2018 Nothing scheduled   Asking for Lyrica, last fill 11/02/2018

## 2018-12-15 NOTE — Telephone Encounter (Signed)
Done  Sent to pharmacy

## 2018-12-22 ENCOUNTER — Other Ambulatory Visit: Payer: Self-pay

## 2018-12-22 ENCOUNTER — Telehealth: Payer: Self-pay | Admitting: Psychiatry

## 2018-12-22 DIAGNOSIS — F902 Attention-deficit hyperactivity disorder, combined type: Secondary | ICD-10-CM

## 2018-12-22 MED ORDER — ZOLPIDEM TARTRATE 10 MG PO TABS: 15.0000 mg | ORAL_TABLET | Freq: Every evening | ORAL | 2 refills | Status: DC | PRN

## 2018-12-22 MED ORDER — AMPHETAMINE-DEXTROAMPHETAMINE 30 MG PO TABS: ORAL_TABLET | ORAL | 0 refills | Status: DC

## 2018-12-22 NOTE — Telephone Encounter (Signed)
Patient requesting refills on his Ambien and Adderall. No refills on file.

## 2018-12-22 NOTE — Telephone Encounter (Signed)
Submitted for refill

## 2018-12-27 ENCOUNTER — Inpatient Hospital Stay (HOSPITAL_COMMUNITY): Admission: RE | Admit: 2018-12-27 | Payer: PPO | Source: Home / Self Care | Admitting: Orthopedic Surgery

## 2018-12-27 ENCOUNTER — Encounter (HOSPITAL_COMMUNITY): Admission: RE | Payer: Self-pay | Source: Home / Self Care

## 2018-12-27 SURGERY — CONVERSION, PREVIOUS HIP SURGERY, TO TOTAL HIP ARTHROPLASTY
Anesthesia: Spinal | Laterality: Right

## 2019-01-20 ENCOUNTER — Telehealth: Payer: Self-pay | Admitting: Psychiatry

## 2019-01-20 ENCOUNTER — Other Ambulatory Visit: Payer: Self-pay

## 2019-01-20 DIAGNOSIS — F902 Attention-deficit hyperactivity disorder, combined type: Secondary | ICD-10-CM

## 2019-01-20 MED ORDER — AMPHETAMINE-DEXTROAMPHETAMINE 30 MG PO TABS: ORAL_TABLET | ORAL | 0 refills | Status: DC

## 2019-01-20 NOTE — Telephone Encounter (Signed)
Refills available on ambien already will pend Adderall refill for approval

## 2019-01-20 NOTE — Telephone Encounter (Signed)
Pt called requested refill on Adderall and Ambien @ [facility]

## 2019-01-20 NOTE — Telephone Encounter (Signed)
Pt called next appt 6/1

## 2019-01-20 NOTE — Telephone Encounter (Signed)
Please submit has appt 06/01

## 2019-01-24 ENCOUNTER — Other Ambulatory Visit: Payer: Self-pay

## 2019-01-25 ENCOUNTER — Other Ambulatory Visit: Payer: Self-pay

## 2019-01-26 ENCOUNTER — Other Ambulatory Visit: Payer: Self-pay | Admitting: Psychiatry

## 2019-01-27 ENCOUNTER — Telehealth: Payer: Self-pay | Admitting: Psychiatry

## 2019-01-27 NOTE — Telephone Encounter (Signed)
The pharmacy called and said that they have sent three requests to [place] about ray's testerone refill. Please send it to [facility]

## 2019-01-27 NOTE — Telephone Encounter (Signed)
Prescription called into the pharmacy.

## 2019-01-27 NOTE — Telephone Encounter (Signed)
Called and 5 refills

## 2019-02-14 ENCOUNTER — Ambulatory Visit: Payer: PPO | Admitting: Psychiatry

## 2019-02-15 ENCOUNTER — Other Ambulatory Visit: Payer: Self-pay

## 2019-02-15 ENCOUNTER — Telehealth: Payer: Self-pay | Admitting: Psychiatry

## 2019-02-15 DIAGNOSIS — F902 Attention-deficit hyperactivity disorder, combined type: Secondary | ICD-10-CM

## 2019-02-15 MED ORDER — AMPHETAMINE-DEXTROAMPHETAMINE 30 MG PO TABS: ORAL_TABLET | ORAL | 0 refills | Status: DC

## 2019-02-15 NOTE — Telephone Encounter (Signed)
Pt came by to apologize for missing appt 6/1. R/s for June 5. He is currently in a wheelchair as his femur is broken and Dr hasn't r/s surgery yet.  Asks for RF of Adderall to go to pharmacy on file.

## 2019-02-15 NOTE — Telephone Encounter (Signed)
Pended for approval sending to [facility]

## 2019-02-18 ENCOUNTER — Encounter: Payer: Self-pay | Admitting: Psychiatry

## 2019-02-18 ENCOUNTER — Ambulatory Visit (INDEPENDENT_AMBULATORY_CARE_PROVIDER_SITE_OTHER): Payer: PPO | Admitting: Psychiatry

## 2019-02-18 ENCOUNTER — Other Ambulatory Visit: Payer: Self-pay

## 2019-02-18 VITALS — BP 175/115 | HR 105

## 2019-02-18 DIAGNOSIS — F5105 Insomnia due to other mental disorder: Secondary | ICD-10-CM | POA: Diagnosis not present

## 2019-02-18 DIAGNOSIS — F411 Generalized anxiety disorder: Secondary | ICD-10-CM | POA: Diagnosis not present

## 2019-02-18 DIAGNOSIS — I10 Essential (primary) hypertension: Secondary | ICD-10-CM | POA: Diagnosis not present

## 2019-02-18 DIAGNOSIS — F902 Attention-deficit hyperactivity disorder, combined type: Secondary | ICD-10-CM

## 2019-02-18 DIAGNOSIS — F17213 Nicotine dependence, cigarettes, with withdrawal: Secondary | ICD-10-CM | POA: Diagnosis not present

## 2019-02-18 MED ORDER — PROMETHAZINE HCL 25 MG PO TABS: 25.0000 mg | ORAL_TABLET | Freq: Three times a day (TID) | ORAL | 2 refills | Status: DC | PRN

## 2019-02-18 MED ORDER — VARENICLINE TARTRATE 1 MG PO TABS: 1.0000 mg | ORAL_TABLET | Freq: Two times a day (BID) | ORAL | 2 refills | Status: DC

## 2019-02-18 NOTE — Progress Notes (Signed)
David [name] [id number] 29-Jul-1956 63 y.o.  Subjective:   Patient ID:  David [name] is a 63 y.o. (DOB 1956/03/13) male.  Chief Complaint:  Chief Complaint  Patient presents with  . Follow-up    Medication management  . Depression    Medication management  . Anxiety    Medication management    Depression         Associated symptoms include decreased concentration.  Associated symptoms include no suicidal ideas.  Past medical history includes anxiety.   Anxiety  Symptoms include decreased concentration and nervous/anxious behavior. Patient reports no confusion or suicidal ideas.     David [name] presents to the office today for follow-up of anxiety depression and ADD.  Last visit was to September 01, 2018.  His blood pressure was up and he was off of clonidine.  He was continued encouraged to get back on the clonidine both for his blood pressure and the other psychiatric benefits for anxiety and ADD.  Had accident and fractured leg Oct 9 and had to have surgery.  Has a lot of pain.  Using a cane.  Chronic pain.   Stressed by the reduced level of funciton.  Some depression. Pending surgery delayed by Covid.    Friend died in motorcycle MVA today.  Very upset.  Was planning to go to [place] with him.   He would do wild things.  Working on doing creative things.    Can still be self defeating. On a variety of subjects.    No drugs.  Not hard.    Past Psychiatric Medication Trials: Fluoxetine 40, duloxetine, clonidine, hydroxyzine, Lyrica, testosterone, Adderall, Ambien, vitamin D, trazodone, Wellbutrin, venlafaxine Has been under the care of this practice since November 2000  Review of Systems:  Review of Systems  Musculoskeletal: Positive for arthralgias, back pain and gait problem.  Psychiatric/Behavioral: Positive for decreased concentration, depression, dysphoric mood and sleep disturbance. Negative for agitation, behavioral problems, confusion, hallucinations,  self-injury and suicidal ideas. The patient is nervous/anxious. The patient is not hyperactive.     Medications: I have reviewed the patient's current medications.  Current Outpatient Medications  Medication Sig Dispense Refill  . amphetamine-dextroamphetamine (ADDERALL) 30 MG tablet 1 in AM and Noon and 1/2 tab at 4pm 75 tablet 0  . amphetamine-dextroamphetamine (ADDERALL) 30 MG tablet 1 in AM, 1 at noon, and 1/2 at 4PM 75 tablet 0  . amphetamine-dextroamphetamine (ADDERALL) 30 MG tablet 1 in AM, 1 at noon, and 1/2 at 4PM 75 tablet 0  . cloNIDine (CATAPRES) 0.3 MG tablet Take 1 tablet (0.3 mg total) by mouth 2 (two) times daily. 180 tablet 2  . FLUoxetine (PROZAC) 20 MG capsule Take 40 mg by mouth daily.     Marland Kitchen HYDROcodone-acetaminophen (NORCO/VICODIN) 5-325 MG tablet Take 1 tablet by mouth every 6 (six) hours as needed.  0  . hydrOXYzine (ATARAX/VISTARIL) 10 MG tablet Take 1 tablet (10 mg total) by mouth 3 (three) times daily as needed. 90 tablet 3  . Multiple Vitamin (MULTIVITAMIN WITH MINERALS) TABS tablet Take 1 tablet by mouth daily. 30 tablet 0  . pregabalin (LYRICA) 150 MG capsule Take 1 capsule (150 mg total) by mouth 2 (two) times daily. 60 capsule 3  . varenicline (CHANTIX CONTINUING MONTH PAK) 1 MG tablet 1/2 tablet twice daily for 1 week then 1 twice daily 60 tablet 4  . zolpidem (AMBIEN) 10 MG tablet Take 1.5 tablets (15 mg total) by mouth at bedtime as needed for sleep. [place]  tablet 2  . promethazine (PHENERGAN) 25 MG tablet Take 1 tablet (25 mg total) by mouth every 8 (eight) hours as needed for nausea or vomiting. 90 tablet 2  . varenicline (CHANTIX CONTINUING MONTH PAK) 1 MG tablet Take 1 tablet (1 mg total) by mouth 2 (two) times daily. 60 tablet 2   No current facility-administered medications for this visit.     Medication Side Effects: None  Allergies:  Allergies  Allergen Reactions  . Codeine Nausea Only    Past Medical History:  Diagnosis Date  . ADD (attention  deficit disorder)   . Depression   . HTN (hypertension)     Family History  Problem Relation Age of Onset  . Arthritis Other   . Heart disease Other   . Cancer Other   . Hypertension Other     Social History   Socioeconomic History  . Marital status: Married    Spouse name: Not on file  . Number of children: Not on file  . Years of education: Not on file  . Highest education level: Not on file  Occupational History  . Not on file  Social Needs  . Financial resource strain: Somewhat hard  . Food insecurity:    Worry: Never true    Inability: Never true  . Transportation needs:    Medical: No    Non-medical: No  Tobacco Use  . Smoking status: Current Every Day Smoker    Packs/day: 0.50    Years: 25.00    Pack years: 12.50  . Smokeless tobacco: Never Used  Substance and Sexual Activity  . Alcohol use: Yes    Comment: daily  . Drug use: Yes    Types: Marijuana  . Sexual activity: Not on file  Lifestyle  . Physical activity:    Days per week: 4 days    Minutes per session: 30 min  . Stress: Patient refused  Relationships  . Social connections:    Talks on phone: Three times a week    Gets together: Three times a week    Attends religious service: 1 to 4 times per year    Active member of club or organization: No    Attends meetings of clubs or organizations: Never    Relationship status: Married  . Intimate partner violence:    Fear of current or ex partner: Not on file    Emotionally abused: Not on file    Physically abused: Not on file    Forced sexual activity: Not on file  Other Topics Concern  . Not on file  Social History Narrative  . Not on file    Past Medical History, Surgical history, Social history, and Family history were reviewed and updated as appropriate.   Please see review of systems for further details on the patient's review from today.   Objective:   Physical Exam:  BP (!) 175/115   Pulse (!) 105   Physical  Exam Constitutional:      Appearance: Normal appearance.  Neurological:     Mental Status: He is alert.     Motor: No tremor.     Gait: Gait abnormal.  Psychiatric:        Attention and Perception: He is inattentive.        Mood and Affect: Mood is anxious and depressed. Affect is labile.        Speech: Speech normal.        Behavior: Behavior is hyperactive. Behavior is not agitated or  slowed.        Thought Content: Thought content is not paranoid. Thought content does not include homicidal or suicidal ideation.        Cognition and Memory: Cognition normal.     Comments: Fair insight and judgment. Chronically scattered. Not hopeless.     Lab Review:     Component Value Date/Time   NA 138 08/28/2018 1224   K 4.0 08/28/2018 1224   CL 102 08/28/2018 1224   CO2 25 08/28/2018 1224   GLUCOSE 103 (H) 08/28/2018 1224   BUN 5 (L) 08/28/2018 1224   CREATININE 0.90 08/28/2018 1224   CREATININE TEST NOT PERFORMED 09/03/2012 1527   CALCIUM 9.2 08/28/2018 1224   PROT 8.3 (H) 07/31/2018 1850   ALBUMIN 4.0 07/31/2018 1850   AST 16 07/31/2018 1850   ALT 11 07/31/2018 1850   ALKPHOS 128 (H) 07/31/2018 1850   BILITOT 0.4 07/31/2018 1850   GFRNONAA >60 08/28/2018 1224   GFRNONAA TEST NOT PERFORMED 09/03/2012 1527   GFRAA >60 08/28/2018 1224   GFRAA TEST NOT PERFORMED 09/03/2012 1527       Component Value Date/Time   WBC 7.2 08/28/2018 1224   RBC 4.44 08/28/2018 1224   HGB 10.6 (L) 08/28/2018 1224   HCT 35.1 (L) 08/28/2018 1224   PLT 333 08/28/2018 1224   MCV 79.1 (L) 08/28/2018 1224   MCH 23.9 (L) 08/28/2018 1224   MCHC 30.2 08/28/2018 1224   RDW 20.2 (H) 08/28/2018 1224   LYMPHSABS 2.4 08/28/2018 1224   MONOABS 0.7 08/28/2018 1224   EOSABS 0.1 08/28/2018 1224   BASOSABS 0.1 08/28/2018 1224    No results found for: POCLITH, LITHIUM   No results found for: PHENYTOIN, PHENOBARB, VALPROATE, CBMZ   .res Assessment: Plan:    Generalized anxiety disorder  Attention  deficit hyperactivity disorder (ADHD), combined type  Insomnia due to mental condition  Essential hypertension  Cigarette nicotine dependence with withdrawal - Plan: varenicline (CHANTIX CONTINUING MONTH PAK) 1 MG tablet   Greater than 50% of face to face time with patient was spent on counseling and coordination of care. We discussed that David [name] has been chronically disabled by severe ADD and some depression and anxiety.  He is chronically scattered and distracted even on the stimulant at the high dosage and clonidine.  Symptoms are worsened by persistent pain after his fractured hip.  He did not take clonidine this morning.  His blood pressure is high.  He has been taking twice a day in the evening.  Start taking it once in the morning and once in the evening.  Get back on clonidine bc blood pressure is high.  Clonidine is used both for blood pressure and off label for anxiety.  Supportive therapy with focus on self care and substance abuse focus.  Maintain sobriety.  Discussed potential benefits, risks, and side effects of stimulants with patient to include increased heart rate, palpitations, insomnia, increased anxiety, increased irritability, or decreased appetite.  Instructed patient to contact office if experiencing any significant tolerability issues.  Cont meds Prozac for depression and anxiety and Adderall for ADD and clonidine for anxiety and hypertension and Lyrica for chronic pain and off label for anxiety.  Also Ambien for sleep  He wants to try to quit smoking.  He has taken Chantix before but had nausea.  He asked for nausea medicine because it is very important for him to try to quit smoking.  He believes that he can take Chantix and tolerated for 3  weeks he can quit smoking.  We will okay Phenergan.  Also discussed splitting the first dose of Chantix to reduce the nausea and taking a lower dose if necessary.  Also taking the evening dose at bedtime.  Discussed strategies for quitting  smoking.   This appt was 30 mins.  Fu 3 mos  Lynder Parents, MD, DFAPA   Please see After Visit Summary for patient specific instructions.  Future Appointments  Date Time Provider [place]  04/20/2019  2:30 PM Cottle, Billey Co., MD CP-CP None    No orders of the defined types were placed in this encounter.     -------------------------------

## 2019-02-18 NOTE — Patient Instructions (Signed)
Clonidine in morning and evening  Start Chantix 1/2 tablet twice daily for a week then half tablet in the morning and 1 in the evening for a week then if tolerated 1 tablet twice a day.  If you get nausea from the first dose then take half of that tablet with the 2 doses of Adderall to reduce nausea

## 2019-02-23 ENCOUNTER — Other Ambulatory Visit: Payer: Self-pay | Admitting: Psychiatry

## 2019-03-17 ENCOUNTER — Ambulatory Visit: Payer: PPO | Admitting: Podiatry

## 2019-03-21 ENCOUNTER — Telehealth: Payer: Self-pay | Admitting: Psychiatry

## 2019-03-21 ENCOUNTER — Other Ambulatory Visit: Payer: Self-pay

## 2019-03-21 DIAGNOSIS — F902 Attention-deficit hyperactivity disorder, combined type: Secondary | ICD-10-CM

## 2019-03-21 MED ORDER — AMPHETAMINE-DEXTROAMPHETAMINE 30 MG PO TABS: ORAL_TABLET | ORAL | 0 refills | Status: DC

## 2019-03-21 NOTE — Telephone Encounter (Signed)
Pt left v-mail requesting refill on Adderall #[phone]. [facility] on [facility].

## 2019-03-21 NOTE — Telephone Encounter (Signed)
Pended for approval.

## 2019-03-21 NOTE — Telephone Encounter (Signed)
Last fill 06/05

## 2019-03-26 ENCOUNTER — Other Ambulatory Visit: Payer: Self-pay | Admitting: Psychiatry

## 2019-04-01 ENCOUNTER — Other Ambulatory Visit: Payer: Self-pay | Admitting: Psychiatry

## 2019-04-01 DIAGNOSIS — R21 Rash and other nonspecific skin eruption: Secondary | ICD-10-CM | POA: Diagnosis not present

## 2019-04-01 DIAGNOSIS — Z1159 Encounter for screening for other viral diseases: Secondary | ICD-10-CM | POA: Diagnosis not present

## 2019-04-01 DIAGNOSIS — Z114 Encounter for screening for human immunodeficiency virus [HIV]: Secondary | ICD-10-CM | POA: Diagnosis not present

## 2019-04-01 DIAGNOSIS — Z789 Other specified health status: Secondary | ICD-10-CM | POA: Diagnosis not present

## 2019-04-01 DIAGNOSIS — Z131 Encounter for screening for diabetes mellitus: Secondary | ICD-10-CM | POA: Diagnosis not present

## 2019-04-01 DIAGNOSIS — R011 Cardiac murmur, unspecified: Secondary | ICD-10-CM | POA: Diagnosis not present

## 2019-04-01 DIAGNOSIS — Z0000 Encounter for general adult medical examination without abnormal findings: Secondary | ICD-10-CM | POA: Diagnosis not present

## 2019-04-01 DIAGNOSIS — Z13220 Encounter for screening for lipoid disorders: Secondary | ICD-10-CM | POA: Diagnosis not present

## 2019-04-08 DIAGNOSIS — L409 Psoriasis, unspecified: Secondary | ICD-10-CM | POA: Diagnosis not present

## 2019-04-08 DIAGNOSIS — R7301 Impaired fasting glucose: Secondary | ICD-10-CM | POA: Diagnosis not present

## 2019-04-08 DIAGNOSIS — N183 Chronic kidney disease, stage 3 (moderate): Secondary | ICD-10-CM | POA: Diagnosis not present

## 2019-04-08 DIAGNOSIS — R3129 Other microscopic hematuria: Secondary | ICD-10-CM | POA: Diagnosis not present

## 2019-04-08 DIAGNOSIS — D473 Essential (hemorrhagic) thrombocythemia: Secondary | ICD-10-CM | POA: Diagnosis not present

## 2019-04-13 DIAGNOSIS — R9431 Abnormal electrocardiogram [ECG] [EKG]: Secondary | ICD-10-CM | POA: Diagnosis not present

## 2019-04-18 ENCOUNTER — Telehealth: Payer: Self-pay | Admitting: Psychiatry

## 2019-04-18 ENCOUNTER — Other Ambulatory Visit: Payer: Self-pay

## 2019-04-18 DIAGNOSIS — L570 Actinic keratosis: Secondary | ICD-10-CM | POA: Diagnosis not present

## 2019-04-18 DIAGNOSIS — D485 Neoplasm of uncertain behavior of skin: Secondary | ICD-10-CM | POA: Diagnosis not present

## 2019-04-18 DIAGNOSIS — F902 Attention-deficit hyperactivity disorder, combined type: Secondary | ICD-10-CM

## 2019-04-18 DIAGNOSIS — R21 Rash and other nonspecific skin eruption: Secondary | ICD-10-CM | POA: Diagnosis not present

## 2019-04-18 MED ORDER — AMPHETAMINE-DEXTROAMPHETAMINE 30 MG PO TABS: ORAL_TABLET | ORAL | 0 refills | Status: DC

## 2019-04-18 NOTE — Telephone Encounter (Signed)
Will pend for approval

## 2019-04-18 NOTE — Telephone Encounter (Signed)
David [name] called to request refill of his Adderall.  He is totally out.  Appt. 04/20/19.  Please sent to [facility] on [address].

## 2019-04-20 ENCOUNTER — Other Ambulatory Visit: Payer: Self-pay

## 2019-04-20 ENCOUNTER — Encounter: Payer: Self-pay | Admitting: Psychiatry

## 2019-04-20 ENCOUNTER — Ambulatory Visit (INDEPENDENT_AMBULATORY_CARE_PROVIDER_SITE_OTHER): Payer: PPO | Admitting: Psychiatry

## 2019-04-20 VITALS — BP 135/86 | HR 111

## 2019-04-20 DIAGNOSIS — I10 Essential (primary) hypertension: Secondary | ICD-10-CM

## 2019-04-20 DIAGNOSIS — F5105 Insomnia due to other mental disorder: Secondary | ICD-10-CM | POA: Diagnosis not present

## 2019-04-20 DIAGNOSIS — F411 Generalized anxiety disorder: Secondary | ICD-10-CM

## 2019-04-20 DIAGNOSIS — F902 Attention-deficit hyperactivity disorder, combined type: Secondary | ICD-10-CM | POA: Diagnosis not present

## 2019-04-20 NOTE — Progress Notes (Signed)
David [name] [id number] May 27, 1956 63 y.o.  Subjective:   Patient ID:  David [name] is a 63 y.o. (DOB November 10, 1955) male.  Chief Complaint:  Chief Complaint  Patient presents with  . Follow-up    Medication Management  . ADD    Medication Management  . Anxiety    Medication Management  . Depression    Medication Management  . Pain    Depression        Associated symptoms include decreased concentration and fatigue.  Associated symptoms include no suicidal ideas.  Past medical history includes anxiety.   Anxiety Symptoms include decreased concentration and nervous/anxious behavior. Patient reports no confusion or suicidal ideas.     [name] presents to the office today for follow-up of anxiety depression and ADD.  Last seen June.  Had accident and fractured leg Oct 9 and had to have surgery.  Has a lot of pain.  Using a walker today.  It's getting worse.   Chronic pain.   Stressed by the reduced level of funciton.  Some depression. Pending surgery delayed by Covid.  Is now on hydrocodone.    Friend died in motorcycle MVA today.  Very upset.  Was planning to go to [place] with him.   He would do wild things.  Working on doing creative things together.  Still grieving.     Can still be self defeating. On a variety of subjects.  . Patient reports stable mood and denies depressed or irritable moods except as is typical for him.  Anxiety over health px.  Patient denies difficulty with sleep initiation or maintenance. Denies appetite disturbance.  Patient reports that motivation have been good. .  Patient denies any suicidal ideation.  No drugs.  Not hard.  Says he doing well with sobriety but still drinks regularly.  Denies getting drunk.  Disc risk of falling.  M 63 yo.  Past Psychiatric Medication Trials: Fluoxetine 40, duloxetine, clonidine, hydroxyzine, Lyrica, testosterone, Adderall, Ambien, vitamin D, trazodone, Wellbutrin, venlafaxine Has been under the care of  this practice since November 2000  Review of Systems:  Review of Systems  Constitutional: Positive for fatigue.  Musculoskeletal: Positive for arthralgias, back pain and gait problem.  Neurological: Positive for weakness.  Psychiatric/Behavioral: Positive for decreased concentration, depression, dysphoric mood and sleep disturbance. Negative for agitation, behavioral problems, confusion, hallucinations, self-injury and suicidal ideas. The patient is nervous/anxious. The patient is not hyperactive.     Medications: I have reviewed the patient's current medications.  Current Outpatient Medications  Medication Sig Dispense Refill  . amphetamine-dextroamphetamine (ADDERALL) 30 MG tablet 1 in AM and Noon and 1/2 tab at 4pm 75 tablet 0  . amphetamine-dextroamphetamine (ADDERALL) 30 MG tablet 1 in AM, 1 at noon, and 1/2 at 4PM 75 tablet 0  . cloNIDine (CATAPRES) 0.3 MG tablet TAKE 1 TABLET(0.3 MG) BY MOUTH TWICE DAILY 180 tablet 2  . FLUoxetine (PROZAC) 20 MG capsule Take 40 mg by mouth daily.     Marland Kitchen HYDROcodone-acetaminophen (NORCO/VICODIN) 5-325 MG tablet Take 1 tablet by mouth every 6 (six) hours as needed.  0  . hydrOXYzine (ATARAX/VISTARIL) 10 MG tablet TAKE 1 TABLET(10 MG) BY MOUTH THREE TIMES DAILY AS NEEDED 90 tablet 3  . Multiple Vitamin (MULTIVITAMIN WITH MINERALS) TABS tablet Take 1 tablet by mouth daily. 30 tablet 0  . pregabalin (LYRICA) 150 MG capsule Take 1 capsule (150 mg total) by mouth 2 (two) times daily. 60 capsule 3  . promethazine (PHENERGAN) 25 MG tablet  Take 1 tablet (25 mg total) by mouth every 8 (eight) hours as needed for nausea or vomiting. 90 tablet 2  . zolpidem (AMBIEN) 10 MG tablet TAKE 1 AND 1/2 TABLETS(15 MG) BY MOUTH AT BEDTIME AS NEEDED FOR SLEEP 45 tablet 1  . amphetamine-dextroamphetamine (ADDERALL) 30 MG tablet 1 in AM, 1 at noon, and 1/2 at 4PM 75 tablet 0   No current facility-administered medications for this visit.     Medication Side Effects:  None  Allergies:  Allergies  Allergen Reactions  . Codeine Nausea Only    Past Medical History:  Diagnosis Date  . ADD (attention deficit disorder)   . Depression   . HTN (hypertension)     Family History  Problem Relation Age of Onset  . Arthritis Other   . Heart disease Other   . Cancer Other   . Hypertension Other     Social History   Socioeconomic History  . Marital status: Married    Spouse name: Not on file  . Number of children: Not on file  . Years of education: Not on file  . Highest education level: Not on file  Occupational History  . Not on file  Social Needs  . Financial resource strain: Somewhat hard  . Food insecurity    Worry: Never true    Inability: Never true  . Transportation needs    Medical: No    Non-medical: No  Tobacco Use  . Smoking status: Current Every Day Smoker    Packs/day: 0.50    Years: 25.00    Pack years: 12.50  . Smokeless tobacco: Never Used  Substance and Sexual Activity  . Alcohol use: Yes    Comment: daily  . Drug use: Yes    Types: Marijuana  . Sexual activity: Not on file  Lifestyle  . Physical activity    Days per week: 4 days    Minutes per session: 30 min  . Stress: Patient refused  Relationships  . Social Herbalist on phone: Three times a week    Gets together: Three times a week    Attends religious service: 1 to 4 times per year    Active member of club or organization: No    Attends meetings of clubs or organizations: Never    Relationship status: Married  . Intimate partner violence    Fear of current or ex partner: Not on file    Emotionally abused: Not on file    Physically abused: Not on file    Forced sexual activity: Not on file  Other Topics Concern  . Not on file  Social History Narrative  . Not on file    Past Medical History, Surgical history, Social history, and Family history were reviewed and updated as appropriate.   Please see review of systems for further details  on the patient's review from today.   Objective:   Physical Exam:  BP 135/86   Pulse (!) 111   Physical Exam Constitutional:      Appearance: Normal appearance.  Neurological:     Mental Status: He is alert.     Motor: No tremor.     Gait: Gait abnormal.  Psychiatric:        Attention and Perception: He is inattentive.        Mood and Affect: Mood is anxious and depressed. Affect is labile.        Speech: Speech normal.  Behavior: Behavior is hyperactive. Behavior is not agitated or slowed.        Thought Content: Thought content is not paranoid. Thought content does not include homicidal or suicidal ideation.        Cognition and Memory: Cognition normal.     Comments: Fair insight and judgment. Chronically scattered.  Seems slower. Not hopeless.     Lab Review:     Component Value Date/Time   NA 138 08/28/2018 1224   K 4.0 08/28/2018 1224   CL 102 08/28/2018 1224   CO2 25 08/28/2018 1224   GLUCOSE 103 (H) 08/28/2018 1224   BUN 5 (L) 08/28/2018 1224   CREATININE 0.90 08/28/2018 1224   CREATININE TEST NOT PERFORMED 09/03/2012 1527   CALCIUM 9.2 08/28/2018 1224   PROT 8.3 (H) 07/31/2018 1850   ALBUMIN 4.0 07/31/2018 1850   AST 16 07/31/2018 1850   ALT 11 07/31/2018 1850   ALKPHOS 128 (H) 07/31/2018 1850   BILITOT 0.4 07/31/2018 1850   GFRNONAA >60 08/28/2018 1224   GFRNONAA TEST NOT PERFORMED 09/03/2012 1527   GFRAA >60 08/28/2018 1224   GFRAA TEST NOT PERFORMED 09/03/2012 1527       Component Value Date/Time   WBC 7.2 08/28/2018 1224   RBC 4.44 08/28/2018 1224   HGB 10.6 (L) 08/28/2018 1224   HCT 35.1 (L) 08/28/2018 1224   PLT 333 08/28/2018 1224   MCV 79.1 (L) 08/28/2018 1224   MCH 23.9 (L) 08/28/2018 1224   MCHC 30.2 08/28/2018 1224   RDW 20.2 (H) 08/28/2018 1224   LYMPHSABS 2.4 08/28/2018 1224   MONOABS 0.7 08/28/2018 1224   EOSABS 0.1 08/28/2018 1224   BASOSABS 0.1 08/28/2018 1224    No results found for: POCLITH, LITHIUM   No results  found for: PHENYTOIN, PHENOBARB, VALPROATE, CBMZ   .res Assessment: Plan:    Ander was seen today for follow-up, add, anxiety, depression and pain.  Diagnoses and all orders for this visit:  Attention deficit hyperactivity disorder (ADHD), combined type  Generalized anxiety disorder  Insomnia due to mental condition  Essential hypertension  Greater than 50% of face to face time with patient was spent on counseling and coordination of care. We discussed that Ray has been chronically disabled by severe ADD and some depression and anxiety.  He is chronically scattered and distracted even on the stimulant at the high dosage and clonidine.  Symptoms are worsened by persistent pain after his fractured hip.  He did not take clonidine this morning.  His blood pressure is high.  He has been taking twice a day in the evening.  Start taking it once in the morning and once in the evening.  Get back on clonidine bc blood pressure is high.  Clonidine is used both for blood pressure and off label for anxiety.  Supportive therapy with focus on self care and substance abuse focus.  Maintain sobriety.  Discussed potential benefits, risks, and side effects of stimulants with patient to include increased heart rate, palpitations, insomnia, increased anxiety, increased irritability, or decreased appetite.  Instructed patient to contact office if experiencing any significant tolerability issues.  Cont meds Prozac for depression and anxiety and Adderall for ADD and clonidine for anxiety and hypertension and Lyrica for chronic pain and off label for anxiety.  Also Ambien for sleep  He wants to try to quit smoking.  He has taken Chantix before but had nausea.  He asked for nausea medicine because it is very important for him to try  to quit smoking.  He believes that he can take Chantix and tolerated for 3 weeks he can quit smoking.  We will okay Phenergan.  Also discussed splitting the first dose of Chantix to  reduce the nausea and taking a lower dose if necessary.  Also taking the evening dose at bedtime.  Discussed strategies for quitting smoking.  Looks slower than last time.  May be related to hydrocodone.  Disc risk with Adderall and diminishing it's affect.  No med changes.   This appt was 30 mins.  Fu 3 mos  Lynder Parents, MD, DFAPA   Please see After Visit Summary for patient specific instructions.  Future Appointments  Date Time Provider [place]  04/29/2019  1:00 PM [facility] None    No orders of the defined types were placed in this encounter.     -------------------------------

## 2019-04-21 DIAGNOSIS — R739 Hyperglycemia, unspecified: Secondary | ICD-10-CM | POA: Diagnosis not present

## 2019-04-21 DIAGNOSIS — I119 Hypertensive heart disease without heart failure: Secondary | ICD-10-CM | POA: Diagnosis not present

## 2019-04-21 DIAGNOSIS — R9431 Abnormal electrocardiogram [ECG] [EKG]: Secondary | ICD-10-CM | POA: Diagnosis not present

## 2019-04-21 DIAGNOSIS — I10 Essential (primary) hypertension: Secondary | ICD-10-CM | POA: Diagnosis not present

## 2019-04-22 DIAGNOSIS — R3129 Other microscopic hematuria: Secondary | ICD-10-CM | POA: Diagnosis not present

## 2019-04-25 DIAGNOSIS — D473 Essential (hemorrhagic) thrombocythemia: Secondary | ICD-10-CM | POA: Diagnosis not present

## 2019-04-25 DIAGNOSIS — D72820 Lymphocytosis (symptomatic): Secondary | ICD-10-CM | POA: Diagnosis not present

## 2019-04-25 DIAGNOSIS — I10 Essential (primary) hypertension: Secondary | ICD-10-CM | POA: Diagnosis not present

## 2019-04-25 DIAGNOSIS — L409 Psoriasis, unspecified: Secondary | ICD-10-CM | POA: Diagnosis not present

## 2019-04-29 ENCOUNTER — Other Ambulatory Visit (HOSPITAL_COMMUNITY)
Admission: RE | Admit: 2019-04-29 | Discharge: 2019-04-29 | Disposition: A | Discharge status: Home / Self Care | Payer: PPO | Source: Ambulatory Visit | Attending: Oral Surgery | Admitting: Oral Surgery

## 2019-04-29 DIAGNOSIS — Z01812 Encounter for preprocedural laboratory examination: Secondary | ICD-10-CM | POA: Insufficient documentation

## 2019-04-29 DIAGNOSIS — Z20828 Contact with and (suspected) exposure to other viral communicable diseases: Secondary | ICD-10-CM | POA: Insufficient documentation

## 2019-04-29 DIAGNOSIS — K0889 Other specified disorders of teeth and supporting structures: Secondary | ICD-10-CM | POA: Diagnosis not present

## 2019-04-29 LAB — SARS CORONAVIRUS 2 (TAT 6-24 HRS): SARS Coronavirus 2: NEGATIVE

## 2019-05-02 ENCOUNTER — Other Ambulatory Visit: Payer: Self-pay

## 2019-05-02 ENCOUNTER — Encounter (HOSPITAL_COMMUNITY): Payer: Self-pay | Admitting: *Deleted

## 2019-05-03 ENCOUNTER — Ambulatory Visit (HOSPITAL_COMMUNITY): Payer: PPO

## 2019-05-03 ENCOUNTER — Ambulatory Visit (HOSPITAL_COMMUNITY)
Admission: RE | Admit: 2019-05-03 | Discharge: 2019-05-03 | Disposition: A | Discharge status: Home / Self Care | Payer: PPO | Attending: Oral Surgery | Admitting: Oral Surgery

## 2019-05-03 ENCOUNTER — Encounter (HOSPITAL_COMMUNITY): Payer: Self-pay | Admitting: Certified Registered Nurse Anesthetist

## 2019-05-03 ENCOUNTER — Encounter (HOSPITAL_COMMUNITY)
Admission: RE | Disposition: A | Discharge status: Home / Self Care | Payer: Self-pay | Source: Home / Self Care | Attending: Oral Surgery

## 2019-05-03 DIAGNOSIS — Z79899 Other long term (current) drug therapy: Secondary | ICD-10-CM | POA: Insufficient documentation

## 2019-05-03 DIAGNOSIS — F329 Major depressive disorder, single episode, unspecified: Secondary | ICD-10-CM | POA: Insufficient documentation

## 2019-05-03 DIAGNOSIS — D649 Anemia, unspecified: Secondary | ICD-10-CM | POA: Insufficient documentation

## 2019-05-03 DIAGNOSIS — F988 Other specified behavioral and emotional disorders with onset usually occurring in childhood and adolescence: Secondary | ICD-10-CM | POA: Diagnosis not present

## 2019-05-03 DIAGNOSIS — K029 Dental caries, unspecified: Secondary | ICD-10-CM | POA: Diagnosis not present

## 2019-05-03 DIAGNOSIS — F411 Generalized anxiety disorder: Secondary | ICD-10-CM | POA: Diagnosis not present

## 2019-05-03 DIAGNOSIS — K011 Impacted teeth: Secondary | ICD-10-CM | POA: Insufficient documentation

## 2019-05-03 DIAGNOSIS — K0889 Other specified disorders of teeth and supporting structures: Secondary | ICD-10-CM | POA: Diagnosis not present

## 2019-05-03 DIAGNOSIS — F17200 Nicotine dependence, unspecified, uncomplicated: Secondary | ICD-10-CM | POA: Insufficient documentation

## 2019-05-03 DIAGNOSIS — I10 Essential (primary) hypertension: Secondary | ICD-10-CM | POA: Insufficient documentation

## 2019-05-03 HISTORY — DX: Personal history of urinary calculi: Z87.442

## 2019-05-03 HISTORY — DX: Generalized anxiety disorder: F41.1

## 2019-05-03 HISTORY — DX: Post-traumatic stress disorder, unspecified: F43.10

## 2019-05-03 HISTORY — DX: Anemia, unspecified: D64.9

## 2019-05-03 HISTORY — DX: Dyspnea, unspecified: R06.00

## 2019-05-03 HISTORY — PX: MULTIPLE EXTRACTIONS WITH ALVEOLOPLASTY: SHX5342

## 2019-05-03 LAB — BASIC METABOLIC PANEL
Anion gap: 9 (ref 5–15)
BUN: 14 mg/dL (ref 8–23)
CO2: 20 mmol/L — ABNORMAL LOW (ref 22–32)
Calcium: 9 mg/dL (ref 8.9–10.3)
Chloride: 108 mmol/L (ref 98–111)
Creatinine, Ser: 1.06 mg/dL (ref 0.61–1.24)
GFR calc Af Amer: >60 mL/min (ref >60)
GFR calc non Af Amer: >60 mL/min (ref >60)
Glucose, Bld: 105 mg/dL — ABNORMAL HIGH (ref 70–99)
Potassium: 3.9 mmol/L (ref 3.5–5.1)
Sodium: 137 mmol/L (ref 135–145)

## 2019-05-03 LAB — CBC
HCT: 36.6 % — ABNORMAL LOW (ref 39.0–52.0)
Hemoglobin: 11.3 g/dL — ABNORMAL LOW (ref 13.0–17.0)
MCH: 27.9 pg (ref 26.0–34.0)
MCHC: 30.9 g/dL (ref 30.0–36.0)
MCV: 90.4 fL (ref 80.0–100.0)
Platelets: 421 10*3/uL — ABNORMAL HIGH (ref 150–400)
RBC: 4.05 MIL/uL — ABNORMAL LOW (ref 4.22–5.81)
RDW: 16.1 % — ABNORMAL HIGH (ref 11.5–15.5)
WBC: 9.5 10*3/uL (ref 4.0–10.5)
nRBC: 0 % (ref 0.0–0.2)

## 2019-05-03 SURGERY — MULTIPLE EXTRACTION WITH ALVEOLOPLASTY
Anesthesia: General | Site: Mouth | Laterality: Bilateral

## 2019-05-03 MED ORDER — ONDANSETRON HCL 4 MG PO TABS: 4.0000 mg | ORAL_TABLET | Freq: Three times a day (TID) | ORAL | 0 refills | Status: DC | PRN

## 2019-05-03 MED ORDER — LIDOCAINE-EPINEPHRINE 2 %-1:100000 IJ SOLN
INTRAMUSCULAR | Status: DC | PRN
  Administered 2019-05-03: 20 mL

## 2019-05-03 MED ORDER — HYDROMORPHONE HCL 1 MG/ML IJ SOLN
INTRAMUSCULAR | Status: AC
  Filled 2019-05-03: qty 1

## 2019-05-03 MED ORDER — OXYCODONE-ACETAMINOPHEN 5-325 MG PO TABS: 1.0000 | ORAL_TABLET | ORAL | Status: DC | PRN

## 2019-05-03 MED ORDER — 0.9 % SODIUM CHLORIDE (POUR BTL) OPTIME
TOPICAL | Status: DC | PRN
  Administered 2019-05-03: 1000 mL

## 2019-05-03 MED ORDER — SUCCINYLCHOLINE CHLORIDE 200 MG/10ML IV SOSY
PREFILLED_SYRINGE | INTRAVENOUS | Status: DC | PRN
  Administered 2019-05-03: 100 mg via INTRAVENOUS

## 2019-05-03 MED ORDER — AMOXICILLIN 500 MG PO CAPS: 500.0000 mg | ORAL_CAPSULE | Freq: Three times a day (TID) | ORAL | 0 refills | Status: DC

## 2019-05-03 MED ORDER — DEXMEDETOMIDINE HCL IN NACL 200 MCG/50ML IV SOLN
INTRAVENOUS | Status: DC | PRN
  Administered 2019-05-03 (×2): 10 ug via INTRAVENOUS

## 2019-05-03 MED ORDER — OXYMETAZOLINE HCL 0.05 % NA SOLN
NASAL | Status: DC | PRN
  Administered 2019-05-03: 2 via NASAL

## 2019-05-03 MED ORDER — ACETAMINOPHEN 500 MG PO TABS: 1000.0000 mg | ORAL_TABLET | Freq: Once | ORAL | Status: DC

## 2019-05-03 MED ORDER — HYDROMORPHONE HCL 1 MG/ML IJ SOLN
0.2500 mg | INTRAMUSCULAR | Status: DC | PRN
  Administered 2019-05-03 (×4): 0.5 mg via INTRAVENOUS

## 2019-05-03 MED ORDER — FENTANYL CITRATE (PF) 250 MCG/5ML IJ SOLN
INTRAMUSCULAR | Status: AC
  Filled 2019-05-03: qty 5

## 2019-05-03 MED ORDER — LACTATED RINGERS IV SOLN
INTRAVENOUS | Status: DC
  Administered 2019-05-03 (×2): via INTRAVENOUS

## 2019-05-03 MED ORDER — OXYCODONE-ACETAMINOPHEN 5-325 MG PO TABS: 1.0000 | ORAL_TABLET | ORAL | 0 refills | Status: DC | PRN

## 2019-05-03 MED ORDER — LIDOCAINE-EPINEPHRINE 2 %-1:100000 IJ SOLN
INTRAMUSCULAR | Status: AC
  Filled 2019-05-03: qty 1

## 2019-05-03 MED ORDER — MIDAZOLAM HCL 2 MG/2ML IJ SOLN
INTRAMUSCULAR | Status: AC
  Filled 2019-05-03: qty 2

## 2019-05-03 MED ORDER — SODIUM CHLORIDE 0.9 % IR SOLN
Status: DC | PRN
  Administered 2019-05-03: 1000 mL

## 2019-05-03 MED ORDER — ONDANSETRON HCL 4 MG/2ML IJ SOLN
INTRAMUSCULAR | Status: DC | PRN
  Administered 2019-05-03: 4 mg via INTRAVENOUS

## 2019-05-03 MED ORDER — OXYCODONE-ACETAMINOPHEN 5-325 MG PO TABS
ORAL_TABLET | ORAL | Status: AC
  Filled 2019-05-03: qty 1

## 2019-05-03 MED ORDER — STERILE WATER FOR IRRIGATION IR SOLN
Status: DC | PRN
  Administered 2019-05-03: 1000 mL

## 2019-05-03 MED ORDER — CLINDAMYCIN PHOSPHATE 600 MG/50ML IV SOLN
600.0000 mg | INTRAVENOUS | Status: AC
  Administered 2019-05-03: 09:00:00 600 mg via INTRAVENOUS

## 2019-05-03 MED ORDER — EPHEDRINE SULFATE-NACL 50-0.9 MG/10ML-% IV SOSY
PREFILLED_SYRINGE | INTRAVENOUS | Status: DC | PRN
  Administered 2019-05-03 (×3): 5 mg via INTRAVENOUS

## 2019-05-03 MED ORDER — CLINDAMYCIN PHOSPHATE 600 MG/50ML IV SOLN
INTRAVENOUS | Status: AC
  Filled 2019-05-03: qty 50

## 2019-05-03 MED ORDER — ACETAMINOPHEN 10 MG/ML IV SOLN
1000.0000 mg | Freq: Once | INTRAVENOUS | Status: AC
  Administered 2019-05-03: 11:00:00 1000 mg via INTRAVENOUS

## 2019-05-03 MED ORDER — EPHEDRINE 5 MG/ML INJ
INTRAVENOUS | Status: AC
  Filled 2019-05-03: qty 10

## 2019-05-03 MED ORDER — ACETAMINOPHEN 10 MG/ML IV SOLN
INTRAVENOUS | Status: AC
  Filled 2019-05-03: qty 100

## 2019-05-03 MED ORDER — LIDOCAINE 2% (20 MG/ML) 5 ML SYRINGE
INTRAMUSCULAR | Status: DC | PRN
  Administered 2019-05-03: 60 mg via INTRAVENOUS

## 2019-05-03 MED ORDER — MIDAZOLAM HCL 5 MG/5ML IJ SOLN
INTRAMUSCULAR | Status: DC | PRN
  Administered 2019-05-03: 2 mg via INTRAVENOUS

## 2019-05-03 MED ORDER — FENTANYL CITRATE (PF) 100 MCG/2ML IJ SOLN
INTRAMUSCULAR | Status: DC | PRN
  Administered 2019-05-03: 25 ug via INTRAVENOUS
  Administered 2019-05-03: 100 ug via INTRAVENOUS
  Administered 2019-05-03: 25 ug via INTRAVENOUS

## 2019-05-03 MED ORDER — PROPOFOL 10 MG/ML IV BOLUS
INTRAVENOUS | Status: DC | PRN
  Administered 2019-05-03: 30 mg via INTRAVENOUS
  Administered 2019-05-03: 150 mg via INTRAVENOUS
  Administered 2019-05-03: 50 mg via INTRAVENOUS

## 2019-05-03 SURGICAL SUPPLY — 35 items
BLADE SURG 15 STRL LF DISP TIS (BLADE) ×1 IMPLANT
BLADE SURG 15 STRL SS (BLADE) ×2
BUR CROSS CUT FISSURE 1.6 (BURR) ×2 IMPLANT
BUR CROSS CUT FISSURE 1.6MM (BURR) ×1
BUR EGG ELITE 4.0 (BURR) ×2 IMPLANT
BUR EGG ELITE 4.0MM (BURR) ×1
CANISTER SUCT 3000ML PPV (MISCELLANEOUS) ×3 IMPLANT
COVER SURGICAL LIGHT HANDLE (MISCELLANEOUS) ×3 IMPLANT
COVER WAND RF STERILE (DRAPES) ×3 IMPLANT
DRAPE U-SHAPE 76X120 STRL (DRAPES) ×3 IMPLANT
GAUZE PACKING FOLDED 2  STR (GAUZE/BANDAGES/DRESSINGS) ×2
GAUZE PACKING FOLDED 2 STR (GAUZE/BANDAGES/DRESSINGS) ×1 IMPLANT
GLOVE BIO SURGEON STRL SZ7.5 (GLOVE) ×3 IMPLANT
GLOVE BIOGEL PI IND STRL 7.0 (GLOVE) ×1 IMPLANT
GLOVE BIOGEL PI INDICATOR 7.0 (GLOVE) ×2
GOWN STRL REUS W/ TWL LRG LVL3 (GOWN DISPOSABLE) ×1 IMPLANT
GOWN STRL REUS W/ TWL XL LVL3 (GOWN DISPOSABLE) ×1 IMPLANT
GOWN STRL REUS W/TWL LRG LVL3 (GOWN DISPOSABLE) ×2
GOWN STRL REUS W/TWL XL LVL3 (GOWN DISPOSABLE) ×2
IV NS 1000ML (IV SOLUTION) ×2
IV NS 1000ML BAXH (IV SOLUTION) ×1 IMPLANT
KIT BASIN OR (CUSTOM PROCEDURE TRAY) ×3 IMPLANT
KIT TURNOVER KIT B (KITS) ×3 IMPLANT
NEEDLE 22X1 1/2 (OR ONLY) (NEEDLE) ×6 IMPLANT
NEEDLE HYPO 25GX1X1/2 BEV (NEEDLE) ×6 IMPLANT
NS IRRIG 1000ML POUR BTL (IV SOLUTION) ×3 IMPLANT
PAD ARMBOARD 7.5X6 YLW CONV (MISCELLANEOUS) ×3 IMPLANT
POSITIONER HEAD DONUT 9IN (MISCELLANEOUS) ×3 IMPLANT
SLEEVE IRRIGATION ELITE 7 (MISCELLANEOUS) ×3 IMPLANT
SUT CHROMIC 3 0 PS 2 (SUTURE) ×9 IMPLANT
SYR CONTROL 10ML LL (SYRINGE) ×3 IMPLANT
TRAY ENT MC OR (CUSTOM PROCEDURE TRAY) ×3 IMPLANT
TUBING IRRIGATION (MISCELLANEOUS) ×3 IMPLANT
WATER STERILE IRR 1000ML POUR (IV SOLUTION) ×3 IMPLANT
YANKAUER SUCT BULB TIP NO VENT (SUCTIONS) ×3 IMPLANT

## 2019-05-03 NOTE — H&P (Signed)
HISTORY AND PHYSICAL  [name] is a 63 y.o. male patient with CC: painful teeth. Referred by general dentist for full dental extractions.   No diagnosis found.  Past Medical History:  Diagnosis Date  . ADD (attention deficit disorder)   . Anemia    Microcytic  . Depression   . Dyspnea   . GAD (generalized anxiety disorder)   . History of kidney stones    passed  . HTN (hypertension)   . PTSD (post-traumatic stress disorder)     Current Facility-Administered Medications  Medication Dose Route Frequency Provider Last Rate Last Dose  . clindamycin (CLEOCIN) 600 MG/50ML IVPB           . clindamycin (CLEOCIN) IVPB 600 mg  600 mg Intravenous On Call to OR Diona Browner, DDS      . lactated ringers infusion   Intravenous Continuous Suzette Battiest, MD 50 mL/hr at 05/03/19 0702     Allergies  Allergen Reactions  . Codeine Nausea Only   Active Problems:   * No active hospital problems. *  Vitals: Blood pressure 139/84, pulse 98, temperature 98.5 F (36.9 C), temperature source Oral, resp. rate 18, height 5\' 8"  (1.727 m), weight 74.8 kg, SpO2 100 %. Lab results: Results for orders placed or performed during the hospital encounter of 05/03/19 (from the past 24 hour(s))  CBC     Status: Abnormal   Collection Time: 05/03/19  6:34 AM  Result Value Ref Range   WBC 9.5 4.0 - 10.5 K/uL   RBC 4.05 (L) 4.22 - 5.81 MIL/uL   Hemoglobin 11.3 (L) 13.0 - 17.0 g/dL   HCT 36.6 (L) 39.0 - 52.0 %   MCV 90.4 80.0 - 100.0 fL   MCH 27.9 26.0 - 34.0 pg   MCHC 30.9 30.0 - 36.0 g/dL   RDW 16.1 (H) 11.5 - 15.5 %   Platelets 421 (H) 150 - 400 K/uL   nRBC 0.0 0.0 - 0.2 %   Radiology Results: No results found. General appearance: alert, cooperative and no distress Head: Normocephalic, without obvious abnormality, atraumatic Eyes: negative Nose: Nares normal. Septum midline. Mucosa normal. No drainage or sinus tenderness. Throat: multiple carious teeth, no purulence, fluctuance, trismus.  Pharynx clear.  Neck: no adenopathy and supple, symmetrical, trachea midline Resp: clear to auscultation bilaterally Cardio: regular rate and rhythm, S1, S2 normal, no murmur, click, rub or gallop  Assessment: Multiple non-restorable/impacted teeth.   Plan: Full dental extractions with alveoloplasty. GA.Marland Kitchen Day surgery.    Diona Browner 05/03/2019

## 2019-05-03 NOTE — Anesthesia Postprocedure Evaluation (Signed)
Anesthesia Post Note  Patient: PARMINDER CUPPLES  Procedure(s) Performed: MULTIPLE EXTRACTION TEETH NUMBER TWO, THREE, FIVE, SIX, SEVEN, EIGHT, NINE, TEN, ELEVEN, FIFTEEN, SIXTEEN, SEVENTEEN, NINETEEN, TWENTY, TWENTY-ONE, TWENTY-TWO, TWENTY-THREE, TWENTY-FOUR, TWENTY-FIVE, TWENTY-SIX, TWENTY-SEVEN, TWENTY-EIGHT, TWENTY-NINE, THIRTY-TWO WITH ALVEOLOPLASTY (Bilateral Mouth)     Patient location during evaluation: PACU Anesthesia Type: General Level of consciousness: awake and alert Pain management: pain level controlled Vital Signs Assessment: post-procedure vital signs reviewed and stable Respiratory status: spontaneous breathing, nonlabored ventilation and respiratory function stable Cardiovascular status: blood pressure returned to baseline and stable Postop Assessment: no apparent nausea or vomiting Anesthetic complications: no    Last Vitals:  Vitals:   05/03/19 1128 05/03/19 1135  BP: 120/64 (!) 136/56  Pulse: (!) 48   Resp: 12 15  Temp:    SpO2: 98% 98%    Last Pain:  Vitals:   05/03/19 1135  TempSrc:   PainSc: 0-No pain                 Samaa Ueda,W. EDMOND

## 2019-05-03 NOTE — Anesthesia Preprocedure Evaluation (Addendum)
Anesthesia Evaluation  Patient identified by MRN, date of birth, ID band Patient awake    Reviewed: Allergy & Precautions, H&P , NPO status , Patient's Chart, lab work & pertinent test results  Airway Mallampati: II  TM Distance: >3 FB Neck ROM: Full    Dental no notable dental hx. (+) Poor Dentition, Dental Advisory Given   Pulmonary Current Smoker and Patient abstained from smoking.,    Pulmonary exam normal breath sounds clear to auscultation       Cardiovascular hypertension, Pt. on medications  Rhythm:Regular Rate:Normal     Neuro/Psych Anxiety Depression negative neurological ROS     GI/Hepatic negative GI ROS, Neg liver ROS,   Endo/Other  negative endocrine ROS  Renal/GU negative Renal ROS  negative genitourinary   Musculoskeletal   Abdominal   Peds  Hematology  (+) Blood dyscrasia, anemia ,   Anesthesia Other Findings   Reproductive/Obstetrics negative OB ROS                            Anesthesia Physical Anesthesia Plan  ASA: II  Anesthesia Plan: General   Post-op Pain Management:    Induction: Intravenous  PONV Risk Score and Plan: 2 and Ondansetron, Dexamethasone and Midazolam  Airway Management Planned: Nasal ETT and Video Laryngoscope Planned  Additional Equipment:   Intra-op Plan:   Post-operative Plan: Extubation in OR  Informed Consent: I have reviewed the patients History and Physical, chart, labs and discussed the procedure including the risks, benefits and alternatives for the proposed anesthesia with the patient or authorized representative who has indicated his/her understanding and acceptance.     Dental advisory given  Plan Discussed with: CRNA  Anesthesia Plan Comments:         Anesthesia Quick Evaluation

## 2019-05-03 NOTE — Anesthesia Procedure Notes (Signed)
Procedure Name: Intubation Date/Time: 05/03/2019 8:56 AM Performed by: Janene Harvey, CRNA Pre-anesthesia Checklist: Patient identified, Emergency Drugs available, Suction available and Patient being monitored Patient Re-evaluated:Patient Re-evaluated prior to induction Oxygen Delivery Method: Circle System Utilized Preoxygenation: Pre-oxygenation with 100% oxygen Induction Type: IV induction Laryngoscope Size: Glidescope and 3 Grade View: Grade I Nasal Tubes: Nasal Rae, Nasal prep performed and Right Tube size: 7.5 mm Number of attempts: 1 Airway Equipment and Method: Video-laryngoscopy Placement Confirmation: ETT inserted through vocal cords under direct vision,  positive ETCO2 and breath sounds checked- equal and bilateral Tube secured with: Tape Dental Injury: Teeth and Oropharynx as per pre-operative assessment  Comments: Elective glidescope for dental procedure/nasal rae

## 2019-05-03 NOTE — Op Note (Signed)
05/03/2019  9:59 AM  PATIENT:  David [name]  63 y.o. male  PRE-OPERATIVE DIAGNOSIS:  NON RESTORABLE AND IMPACTED TEETH  NUMBER TWO, THREE, FIVE, SIX, SEVEN, EIGHT, NINE, TEN, ELEVEN, FIFTEEN, SIXTEEN, SEVENTEEN, NINETEEN, TWENTY, TWENTY-ONE, TWENTY-TWO, TWENTY-THREE, TWENTY-FOUR, TWENTY-FIVE, TWENTY-SIX, TWENTY-SEVEN, TWENTY-EIGHT, TWENTY-NINE, THIRTY-TWO   POST-OPERATIVE DIAGNOSIS:  SAME  PROCEDURE:  Procedure(s): MULTIPLE EXTRACTION TEETH NUMBER TWO, THREE, FIVE, SIX, SEVEN, EIGHT, NINE, TEN, ELEVEN, FIFTEEN, SIXTEEN, SEVENTEEN, NINETEEN, TWENTY, TWENTY-ONE, TWENTY-TWO, TWENTY-THREE, TWENTY-FOUR, TWENTY-FIVE, TWENTY-SIX, TWENTY-SEVEN, TWENTY-EIGHT, TWENTY-NINE, THIRTY-TWO WITH ALVEOLOPLASTY  SURGEON:  Surgeon(s): Diona Browner, DDS  ANESTHESIA:   local and general  EBL:  minimal  DRAINS: none   SPECIMEN:  No Specimen  COUNTS:  YES  PLAN OF CARE: Discharge to home after PACU  PATIENT DISPOSITION:  PACU - hemodynamically stable.   PROCEDURE DETAILS: Dictation # [id number]  [name], DMD 05/03/2019 9:59 AM

## 2019-05-03 NOTE — Transfer of Care (Signed)
Immediate Anesthesia Transfer of Care Note  Patient: David [name]  Procedure(s) Performed: MULTIPLE EXTRACTION TEETH NUMBER TWO, THREE, FIVE, SIX, SEVEN, EIGHT, NINE, TEN, ELEVEN, FIFTEEN, SIXTEEN, SEVENTEEN, NINETEEN, TWENTY, TWENTY-ONE, TWENTY-TWO, TWENTY-THREE, TWENTY-FOUR, TWENTY-FIVE, TWENTY-SIX, TWENTY-SEVEN, TWENTY-EIGHT, TWENTY-NINE, THIRTY-TWO WITH ALVEOLOPLASTY (Bilateral Mouth)  Patient Location: PACU  Anesthesia Type:General  Level of Consciousness: drowsy  Airway & Oxygen Therapy: Patient Spontanous Breathing and Patient connected to face mask oxygen  Post-op Assessment: Report given to RN and Post -op Vital signs reviewed and stable  Post vital signs: Reviewed and stable  Last Vitals:  Vitals Value Taken Time  BP 150/69 05/03/19 1012  Temp    Pulse 61 05/03/19 1016  Resp 12 05/03/19 1016  SpO2 100 % 05/03/19 1016  Vitals shown include unvalidated device data.  Last Pain:  Vitals:   05/03/19 0623  TempSrc: Oral         Complications: No apparent anesthesia complications

## 2019-05-03 NOTE — Op Note (Signed)
NAME: David [name], Pinole RECORD IW:[id number] ACCOUNT [id number] DATE OF BIRTH:1955-09-19 FACILITY: [facility] LOCATION: [facility] PHYSICIAN:Miquan Tandon M. Lexy Meininger, DDS  OPERATIVE REPORT  DATE OF PROCEDURE:  05/03/2019  PREOPERATIVE DIAGNOSIS:  Nonrestorable teeth numbers 3, 5, 6, 7, 8, 9, 10, 11, 15, 16, 19, 20, 21, 22, 23, 24, 25, 26, 27, 28, 29.  Impacted teeth numbers 16, 17, and 32.  POSTOPERATIVE DIAGNOSIS:  Nonrestorable teeth numbers 3, 5, 6, 7, 8, 9, 10, 11, 15, 16, 19, 20, 21, 22, 23, 24, 25, 26, 27, 28, 29.  Impacted teeth numbers 16, 17, and 32.  PROCEDURE:  Extraction of teeth numbers 2, 3, 5, 6, 7, 8, 9, 10, 11, 15, 16, 17, 19, 20, 21, 22, 23, 24, 25, 26, 27, 28, 29 and 32.  Alveoplasty right and left maxilla and mandible.  SURGEON:  Diona Browner, DDS  ANESTHESIA:  Retta Diones attending, nasal intubation.  DESCRIPTION OF PROCEDURE:  The patient was taken to the operating room and placed on the table in supine position.  General anesthesia was administered intravenously, and a nasal endotracheal tube was placed and secured.  The eyes were protected and the  patient was draped for surgery.  A timeout was performed.  The posterior pharynx was suctioned, and a throat pack was placed.  Lidocaine 2% 1:100,000 epinephrine was infiltrated in an inferior alveolar block bilaterally and in buccal and palatal  infiltration in the maxilla around the teeth to be removed.  A bite block was placed on the right side of the mouth.  A sweetheart retractor was used to retract the tongue.  The left side was operated first beginning in the mandible.  A 15 blade was used  to make an incision overlying tooth #17 and carried forward along the alveolar crest until tooth #19 was reached.  Then, the incision was split in the buccal gingival sulcus and then in the lingual gingival sulcus until tooth #27 was reached.  The  periosteum was reflected, and a Stryker handpiece was used to remove bone overlying  tooth #17.  The tooth was sectioned and removed with a 301 elevator.  Tooth #19 was sectioned with the Stryker handpiece, and bone was removed and the roots were removed  using the 301 elevator.  Bone was also removed circumferentially around teeth numbers 20, 21, and 22.  Then, these teeth were elevated and removed.  The Ash forceps was used to remove teeth numbers 23, 24, 25, and 26.  A root tip of tooth #24 fractured  and was removed with a root tip pick.  Then, the areas were curetted.  The periosteum was reflected to expose the alveolar crest, and then alveoplasty was performed using an egg-shaped bur, followed by a bone file.  Then, the area was closed with 3-0  chromic.  The left maxilla with a 15 blade was used to make an incision overlying tooth #16 and carried around then buccally and palatally around tooth #15.  Then, the incision was carried forward along the alveolar crest and into the buccal and gingival  sulcus around teeth numbers 11, 10, 9, 8, 7.  The periosteum was reflected from around these teeth.  Bone was removed overlying tooth #16.  Tooth #15 was removed by using upper forceps and sectioning the tooth and removing the roots individually.  Then  tooth #16 was removed using the dental forceps after a sufficient amount of bone was removed.  Bone was then removed using a Stryker handpiece around tooth #11.  The  tooth was elevated and removed with a 301 elevator.  Then teeth numbers 10, 9, 8, and 7  were elevated with a 301 elevator and removed from the mouth with the dental forceps and the sockets were curetted.  The periosteum was reflected to expose the alveolar crest, and then alveoplasty was performed using an egg-shaped bur, followed by the  bone file.  The area was irrigated and closed with 3-0 chromic.  The bite block and sweetheart retractor were repositioned to the left side of the mouth, and the right side was operated beginning in the right mandible.  A 15 blade was used to make  an  incision overlying tooth #32, carried forward along the edentulous portion of the mandibular ridge to tooth #29.  There the incision was made buccally in the gingival sulcus from 29-27 and lingually in the sulcus to tooth #27.  A 15 blade was then used  to make an incision around tooth numbers 2 and 3 and carried forward along the edentulous portion of the crest until tooth numbers 5 and 6 were encountered.  Then, the incision was created in the gingival sulcus and palatal sulcus around these teeth.   The periosteum was removed and reflected with a periosteal elevator.  Bone was removed overlying tooth #32, and the tooth was sectioned and removed with a 301 elevator.  Then bone was removed circumferentially around teeth numbers 27, 28, and 29.  The  teeth were elevated and removed with the dental forceps.  Then tooth #2 was removed by removing circumferential bone, and then the upper universal forceps was used to remove the tooth.  Tooth #3 roots were removed with the rongeurs.  Then, tooth #6 was  removed after removing bone around the tooth with a Stryker handpiece under irrigation, and the tooth was removed with the universal forceps.  Then, the right maxilla and mandible were curetted and the periosteum was reflected to allow for alveoplasty  with an egg bur and bone file.  Then, the areas were irrigated and closed with 3-0 chromic.  Then the oral cavity was irrigated and suctioned.  The oral cavity was palpated for contour and found to have good closure, hemostasis, and contour.  Then, the  area was suctioned and the throat pack was removed.  The patient was left in the care of anesthesia for extubation with plans for discharge home through day surgery.  ESTIMATED BLOOD LOSS:  Minimal.  COMPLICATIONS:  None.  SPECIMENS:  None.  LN/NUANCE  D:05/03/2019 T:05/03/2019 JOB:[id number]

## 2019-05-04 ENCOUNTER — Encounter (HOSPITAL_COMMUNITY): Payer: Self-pay | Admitting: Oral Surgery

## 2019-05-10 ENCOUNTER — Other Ambulatory Visit: Payer: Self-pay | Admitting: Oral Surgery

## 2019-05-19 ENCOUNTER — Other Ambulatory Visit: Payer: Self-pay

## 2019-05-19 ENCOUNTER — Telehealth: Payer: Self-pay | Admitting: Psychiatry

## 2019-05-19 ENCOUNTER — Other Ambulatory Visit: Payer: Self-pay | Admitting: Orthopedic Surgery

## 2019-05-19 DIAGNOSIS — F902 Attention-deficit hyperactivity disorder, combined type: Secondary | ICD-10-CM

## 2019-05-19 DIAGNOSIS — M1712 Unilateral primary osteoarthritis, left knee: Secondary | ICD-10-CM | POA: Diagnosis not present

## 2019-05-19 DIAGNOSIS — M1612 Unilateral primary osteoarthritis, left hip: Secondary | ICD-10-CM | POA: Diagnosis not present

## 2019-05-19 DIAGNOSIS — M25551 Pain in right hip: Secondary | ICD-10-CM | POA: Diagnosis not present

## 2019-05-19 MED ORDER — AMPHETAMINE-DEXTROAMPHETAMINE 30 MG PO TABS: ORAL_TABLET | ORAL | 0 refills | Status: DC

## 2019-05-19 NOTE — Telephone Encounter (Signed)
Last refill 04/18/2019

## 2019-05-19 NOTE — Telephone Encounter (Signed)
Pt called to request refill for Adderall @ [address]

## 2019-05-24 ENCOUNTER — Other Ambulatory Visit (HOSPITAL_COMMUNITY)
Admission: RE | Admit: 2019-05-24 | Discharge: 2019-05-24 | Disposition: A | Discharge status: Home / Self Care | Payer: PPO | Source: Ambulatory Visit | Attending: Orthopedic Surgery | Admitting: Orthopedic Surgery

## 2019-05-24 DIAGNOSIS — Z01812 Encounter for preprocedural laboratory examination: Secondary | ICD-10-CM | POA: Insufficient documentation

## 2019-05-24 DIAGNOSIS — Z20828 Contact with and (suspected) exposure to other viral communicable diseases: Secondary | ICD-10-CM | POA: Diagnosis not present

## 2019-05-25 NOTE — Patient Instructions (Addendum)
DUE TO COVID-19 ONLY ONE VISITOR IS ALLOWED TO COME WITH YOU AND STAY IN THE WAITING ROOM ONLY DURING PRE OP AND PROCEDURE DAY OF SURGERY. THE 1 VISITOR MAY VISIT WITH YOU AFTER SURGERY IN YOUR PRIVATE ROOM DURING VISITING HOURS ONLY!  YOU HAVE COMPLETED YOUR COVID-19 TEST. PLEASE CONTINUE THE QUARANTINE INSTRUCTIONS AS OUTLINED IN YOUR HANDOUT.                David [name]   Your procedure is scheduled on: [phone]   Report to [facility]  Entrance   Report to admitting at 7:25AM     Call this number if you have problems the morning of surgery Candor, NO CHEWING GUM David [name].   Do not eat food After Midnight. YOU MAY HAVE CLEAR LIQUIDS FROM MIDNIGHT UNTIL 6:55AM. At 6:55AM Please finish the prescribed Pre-Surgery ENSURE drink. Nothing by mouth after you finish the ENSURE drink !   CLEAR LIQUID DIET   Foods Allowed                                                                     Foods Excluded  Coffee and tea, regular and decaf                             liquids that you cannot  Plain Jell-O any favor except red or purple                                           see through such as: Fruit ices (not with fruit pulp)                                     milk, soups, orange juice  Iced Popsicles                                    All solid food Carbonated beverages, regular and diet                                    Cranberry, grape and apple juices Sports drinks like Gatorade Lightly seasoned clear broth or consume(fat free) Sugar, honey syrup  Sample Menu Breakfast                                Lunch                                     Supper Cranberry juice                    Beef broth  Chicken broth Jell-O                                     Grape juice                           Apple juice Coffee or tea                        Jell-O                                       Popsicle                                                Coffee or tea                        Coffee or tea  _____________________________________________________________________     Take these medicines the morning of surgery with A SIP OF WATER: Metoprolol  PERCOCET IF NEEDED                                 You may not have any metal on your body including hair pins and              piercings  Do not wear jewelry, make-up, lotions, powders or perfumes, deodorant                        Men may shave face and neck.   Do not bring valuables to the hospital. Paint.  Contacts, dentures or bridgework may not be worn into surgery.   Bring small overnight bag                  Please read over the following fact sheets you were given: _____________________________________________________________________             [facility] - Preparing for Surgery Before surgery, you can play an important role.  Because skin is not sterile, your skin needs to be as free of germs as possible.  You can reduce the number of germs on your skin by washing with CHG (chlorahexidine gluconate) soap before surgery.  CHG is an antiseptic cleaner which kills germs and bonds with the skin to continue killing germs even after washing. Please DO NOT use if you have an allergy to CHG or antibacterial soaps.  If your skin becomes reddened/irritated stop using the CHG and inform your nurse when you arrive at Short Stay. Do not shave (including legs and underarms) for at least 48 hours prior to the first CHG shower.  You may shave your face/neck. Please follow these instructions carefully:  1.  Shower with CHG Soap the night before surgery and the  morning of Surgery.  2.  If you choose to wash your hair, wash your hair first as usual with your  normal  shampoo.  3.  After you shampoo,  rinse your hair and body thoroughly to remove the  shampoo.                            4.  Use CHG as you would any other liquid soap.  You can apply chg directly  to the skin and wash                       Gently with a scrungie or clean washcloth.  5.  Apply the CHG Soap to your body ONLY FROM THE NECK DOWN.   Do not use on face/ open                           Wound or open sores. Avoid contact with eyes, ears mouth and genitals (private parts).                       Wash face,  Genitals (private parts) with your normal soap.             6.  Wash thoroughly, paying special attention to the area where your surgery  will be performed.  7.  Thoroughly rinse your body with warm water from the neck down.  8.  DO NOT shower/wash with your normal soap after using and rinsing off  the CHG Soap.                9.  Pat yourself dry with a clean towel.            10.  Wear clean pajamas.            11.  Place clean sheets on your bed the night of your first shower and do not  sleep with pets. Day of Surgery : Do not apply any lotions/deodorants the morning of surgery.  Please wear clean clothes to the hospital/surgery center.  FAILURE TO FOLLOW THESE INSTRUCTIONS MAY RESULT IN THE CANCELLATION OF YOUR SURGERY PATIENT SIGNATURE_________________________________  NURSE SIGNATURE__________________________________  ________________________________________________________________________   David [name]  An incentive spirometer is a tool that can help keep your lungs clear and active. This tool measures how well you are filling your lungs with each breath. Taking long deep breaths may help reverse or decrease the chance of developing breathing (pulmonary) problems (especially infection) following:  A long period of time when you are unable to move or be active. BEFORE THE PROCEDURE   If the spirometer includes an indicator to show your best effort, your nurse or respiratory therapist will set it to a desired goal.  If possible, sit up straight or lean  slightly forward. Try not to slouch.  Hold the incentive spirometer in an upright position. INSTRUCTIONS FOR USE  1. Sit on the edge of your bed if possible, or sit up as far as you can in bed or on a chair. 2. Hold the incentive spirometer in an upright position. 3. Breathe out normally. 4. Place the mouthpiece in your mouth and seal your lips tightly around it. 5. Breathe in slowly and as deeply as possible, raising the piston or the ball toward the top of the column. 6. Hold your breath for 3-5 seconds or for as long as possible. Allow the piston or ball to fall to the bottom of the column. 7. Remove the mouthpiece from your mouth and breathe out normally. 8. Rest for a few seconds  and repeat Steps 1 through 7 at least 10 times every 1-2 hours when you are awake. Take your time and take a few normal breaths between deep breaths. 9. The spirometer may include an indicator to show your best effort. Use the indicator as a goal to work toward during each repetition. 10. After each set of 10 deep breaths, practice coughing to be sure your lungs are clear. If you have an incision (the cut made at the time of surgery), support your incision when coughing by placing a pillow or rolled up towels firmly against it. Once you are able to get out of bed, walk around indoors and cough well. You may stop using the incentive spirometer when instructed by your caregiver.  RISKS AND COMPLICATIONS  Take your time so you do not get dizzy or light-headed.  If you are in pain, you may need to take or ask for pain medication before doing incentive spirometry. It is harder to take a deep breath if you are having pain. AFTER USE  Rest and breathe slowly and easily.  It can be helpful to keep track of a log of your progress. Your caregiver can provide you with a simple table to help with this. If you are using the spirometer at home, follow these instructions: [place] IF:   You are having difficultly  using the spirometer.  You have trouble using the spirometer as often as instructed.  Your pain medication is not giving enough relief while using the spirometer.  You develop fever of 100.5 F (38.1 C) or higher. SEEK IMMEDIATE MEDICAL CARE IF:   You cough up bloody sputum that had not been present before.  You develop fever of 102 F (38.9 C) or greater.  You develop worsening pain at or near the incision site. MAKE SURE YOU:   Understand these instructions.  Will watch your condition.  Will get help right away if you are not doing well or get worse. Document Released: 01/12/2007 Document Revised: 11/24/2011 Document Reviewed: 03/15/2007 [facility] Patient Information 2014 [place], [place].   ________________________________________________________________________

## 2019-05-26 ENCOUNTER — Encounter (HOSPITAL_COMMUNITY): Payer: Self-pay

## 2019-05-26 ENCOUNTER — Encounter (HOSPITAL_COMMUNITY)
Admission: RE | Admit: 2019-05-26 | Discharge: 2019-05-26 | Disposition: A | Discharge status: Home / Self Care | Payer: PPO | Source: Ambulatory Visit | Attending: Orthopedic Surgery | Admitting: Orthopedic Surgery

## 2019-05-26 ENCOUNTER — Other Ambulatory Visit: Payer: Self-pay

## 2019-05-26 DIAGNOSIS — Z01818 Encounter for other preprocedural examination: Secondary | ICD-10-CM | POA: Insufficient documentation

## 2019-05-26 DIAGNOSIS — Z01811 Encounter for preprocedural respiratory examination: Secondary | ICD-10-CM

## 2019-05-26 DIAGNOSIS — M1612 Unilateral primary osteoarthritis, left hip: Secondary | ICD-10-CM | POA: Diagnosis not present

## 2019-05-26 HISTORY — DX: Unspecified malignant neoplasm of skin, unspecified: C44.90

## 2019-05-26 HISTORY — DX: Bipolar disorder, unspecified: F31.9

## 2019-05-26 HISTORY — DX: Unspecified osteoarthritis, unspecified site: M19.90

## 2019-05-26 LAB — CBC WITH DIFFERENTIAL/PLATELET
Abs Immature Granulocytes: 0.04 10*3/uL (ref 0.00–0.07)
Basophils Absolute: 0.1 10*3/uL (ref 0.0–0.1)
Basophils Relative: 1 %
Eosinophils Absolute: 0.3 10*3/uL (ref 0.0–0.5)
Eosinophils Relative: 3 %
HCT: 43 % (ref 39.0–52.0)
Hemoglobin: 13.4 g/dL (ref 13.0–17.0)
Immature Granulocytes: 0 %
Lymphocytes Relative: 31 %
Lymphs Abs: 3.1 10*3/uL (ref 0.7–4.0)
MCH: 28 pg (ref 26.0–34.0)
MCHC: 31.2 g/dL (ref 30.0–36.0)
MCV: 90 fL (ref 80.0–100.0)
Monocytes Absolute: 1.2 10*3/uL — ABNORMAL HIGH (ref 0.1–1.0)
Monocytes Relative: 12 %
Neutro Abs: 5.3 10*3/uL (ref 1.7–7.7)
Neutrophils Relative %: 53 %
Platelets: 435 10*3/uL — ABNORMAL HIGH (ref 150–400)
RBC: 4.78 MIL/uL (ref 4.22–5.81)
RDW: 15.8 % — ABNORMAL HIGH (ref 11.5–15.5)
WBC: 9.9 10*3/uL (ref 4.0–10.5)
nRBC: 0 % (ref 0.0–0.2)

## 2019-05-26 LAB — COMPREHENSIVE METABOLIC PANEL
ALT: 17 U/L (ref 0–44)
AST: 21 U/L (ref 15–41)
Albumin: 4.2 g/dL (ref 3.5–5.0)
Alkaline Phosphatase: 135 U/L — ABNORMAL HIGH (ref 38–126)
Anion gap: 9 (ref 5–15)
BUN: 13 mg/dL (ref 8–23)
CO2: 29 mmol/L (ref 22–32)
Calcium: 9.6 mg/dL (ref 8.9–10.3)
Chloride: 101 mmol/L (ref 98–111)
Creatinine, Ser: 0.91 mg/dL (ref 0.61–1.24)
GFR calc Af Amer: >60 mL/min (ref >60)
GFR calc non Af Amer: >60 mL/min (ref >60)
Glucose, Bld: 110 mg/dL — ABNORMAL HIGH (ref 70–99)
Potassium: 5.4 mmol/L — ABNORMAL HIGH (ref 3.5–5.1)
Sodium: 139 mmol/L (ref 135–145)
Total Bilirubin: 0.5 mg/dL (ref 0.3–1.2)
Total Protein: 8.7 g/dL — ABNORMAL HIGH (ref 6.5–8.1)

## 2019-05-26 LAB — URINALYSIS, ROUTINE W REFLEX MICROSCOPIC
Bilirubin Urine: NEGATIVE
Glucose, UA: NEGATIVE mg/dL
Hgb urine dipstick: NEGATIVE
Ketones, ur: NEGATIVE mg/dL
Leukocytes,Ua: NEGATIVE
Nitrite: NEGATIVE
Protein, ur: NEGATIVE mg/dL
Specific Gravity, Urine: 1.019 (ref 1.005–1.030)
pH: 6 (ref 5.0–8.0)

## 2019-05-26 LAB — PROTIME-INR
INR: 1 (ref 0.8–1.2)
Prothrombin Time: 13.3 seconds (ref 11.4–15.2)

## 2019-05-26 LAB — SURGICAL PCR SCREEN
MRSA, PCR: NEGATIVE
Staphylococcus aureus: POSITIVE — AB

## 2019-05-26 LAB — NOVEL CORONAVIRUS, NAA (HOSP ORDER, SEND-OUT TO REF LAB; TAT 18-24 HRS): SARS-CoV-2, NAA: NOT DETECTED

## 2019-05-26 LAB — APTT: aPTT: 30 seconds (ref 24–36)

## 2019-05-26 MED ORDER — BUPIVACAINE LIPOSOME 1.3 % IJ SUSP
20.0000 mL | INTRAMUSCULAR | Status: DC
  Filled 2019-05-26: qty 20

## 2019-05-26 NOTE — Progress Notes (Signed)
PCP - [facility] Cardiologist - N/A  Chest x-ray - 08/2018 in epic EKG - 08/2018 in epic Stress Test - N/A ECHO - N/A Cardiac Cath - N/A  Sleep Study - N/A CPAP - N/A  Fasting Blood Sugar - N/A Checks Blood Sugar _N/A____ times a day  Blood Thinner Instructions:N/A Aspirin Instructions:N/A Last Dose:N/A  Anesthesia review: N/A  Patient denies shortness of breath, fever, cough and chest pain at PAT appointment   Patient verbalized understanding of instructions that were given to them at the PAT appointment. Patient was also instructed that they will need to review over the PAT instructions again at home before surgery.

## 2019-05-26 NOTE — Progress Notes (Signed)
SPOKE W/  Isaid     SCREENING SYMPTOMS OF COVID 19:   COUGH--NO  RUNNY NOSE--- NO  SORE THROAT---NO  NASAL CONGESTION----NO  SNEEZING----NO  SHORTNESS OF BREATH---NO  DIFFICULTY BREATHING---NO  TEMP >100.0 -----NO  UNEXPLAINED BODY ACHES------NO  CHILLS -------- NO  HEADACHES ---------NO  LOSS OF SMELL/ TASTE --------NO    HAVE YOU OR ANY FAMILY MEMBER TRAVELLED PAST 14 DAYS OUT OF THE   COUNTY---NO STATE----NO COUNTRY----NO  HAVE YOU OR ANY FAMILY MEMBER BEEN EXPOSED TO ANYONE WITH COVID 19? NO

## 2019-05-27 ENCOUNTER — Encounter (HOSPITAL_COMMUNITY)
Admission: RE | Disposition: A | Discharge status: Home Health | Payer: Self-pay | Source: Home / Self Care | Attending: Orthopedic Surgery

## 2019-05-27 ENCOUNTER — Ambulatory Visit (HOSPITAL_COMMUNITY): Payer: PPO | Admitting: Physician Assistant

## 2019-05-27 ENCOUNTER — Ambulatory Visit (HOSPITAL_COMMUNITY): Payer: PPO | Admitting: Certified Registered"

## 2019-05-27 ENCOUNTER — Encounter (HOSPITAL_COMMUNITY): Payer: Self-pay | Admitting: *Deleted

## 2019-05-27 ENCOUNTER — Observation Stay (HOSPITAL_COMMUNITY)
Admission: RE | Admit: 2019-05-27 | Discharge: 2019-05-28 | Disposition: A | Discharge status: Home Health | Payer: PPO | Attending: Orthopedic Surgery | Admitting: Orthopedic Surgery

## 2019-05-27 ENCOUNTER — Other Ambulatory Visit: Payer: Self-pay

## 2019-05-27 ENCOUNTER — Ambulatory Visit (HOSPITAL_COMMUNITY): Payer: PPO

## 2019-05-27 DIAGNOSIS — F418 Other specified anxiety disorders: Secondary | ICD-10-CM | POA: Diagnosis not present

## 2019-05-27 DIAGNOSIS — D649 Anemia, unspecified: Secondary | ICD-10-CM | POA: Diagnosis not present

## 2019-05-27 DIAGNOSIS — Z85828 Personal history of other malignant neoplasm of skin: Secondary | ICD-10-CM | POA: Diagnosis not present

## 2019-05-27 DIAGNOSIS — F4310 Post-traumatic stress disorder, unspecified: Secondary | ICD-10-CM | POA: Insufficient documentation

## 2019-05-27 DIAGNOSIS — F319 Bipolar disorder, unspecified: Secondary | ICD-10-CM | POA: Insufficient documentation

## 2019-05-27 DIAGNOSIS — Z87891 Personal history of nicotine dependence: Secondary | ICD-10-CM | POA: Insufficient documentation

## 2019-05-27 DIAGNOSIS — M1612 Unilateral primary osteoarthritis, left hip: Secondary | ICD-10-CM | POA: Diagnosis not present

## 2019-05-27 DIAGNOSIS — Z79899 Other long term (current) drug therapy: Secondary | ICD-10-CM | POA: Insufficient documentation

## 2019-05-27 DIAGNOSIS — I10 Essential (primary) hypertension: Secondary | ICD-10-CM | POA: Diagnosis not present

## 2019-05-27 DIAGNOSIS — Z9889 Other specified postprocedural states: Secondary | ICD-10-CM

## 2019-05-27 DIAGNOSIS — Z471 Aftercare following joint replacement surgery: Secondary | ICD-10-CM | POA: Diagnosis not present

## 2019-05-27 DIAGNOSIS — Z96642 Presence of left artificial hip joint: Secondary | ICD-10-CM | POA: Diagnosis not present

## 2019-05-27 HISTORY — PX: TOTAL HIP ARTHROPLASTY: SHX124

## 2019-05-27 LAB — BASIC METABOLIC PANEL
Anion gap: 13 (ref 5–15)
BUN: 10 mg/dL (ref 8–23)
CO2: 24 mmol/L (ref 22–32)
Calcium: 9.1 mg/dL (ref 8.9–10.3)
Chloride: 100 mmol/L (ref 98–111)
Creatinine, Ser: 0.87 mg/dL (ref 0.61–1.24)
GFR calc Af Amer: >60 mL/min (ref >60)
GFR calc non Af Amer: >60 mL/min (ref >60)
Glucose, Bld: 189 mg/dL — ABNORMAL HIGH (ref 70–99)
Potassium: 3.6 mmol/L (ref 3.5–5.1)
Sodium: 137 mmol/L (ref 135–145)

## 2019-05-27 LAB — TYPE AND SCREEN
ABO/RH(D): O POS
Antibody Screen: NEGATIVE

## 2019-05-27 SURGERY — ARTHROPLASTY, HIP, TOTAL, ANTERIOR APPROACH
Anesthesia: General | Site: Hip | Laterality: Left

## 2019-05-27 MED ORDER — ASPIRIN EC 325 MG PO TBEC
325.0000 mg | DELAYED_RELEASE_TABLET | Freq: Two times a day (BID) | ORAL | Status: DC
  Administered 2019-05-27 – 2019-05-28 (×2): 325 mg via ORAL
  Filled 2019-05-27 (×2): qty 1

## 2019-05-27 MED ORDER — PROPOFOL 10 MG/ML IV BOLUS
INTRAVENOUS | Status: AC
  Filled 2019-05-27: qty 40

## 2019-05-27 MED ORDER — PHENYLEPHRINE 40 MCG/ML (10ML) SYRINGE FOR IV PUSH (FOR BLOOD PRESSURE SUPPORT)
PREFILLED_SYRINGE | INTRAVENOUS | Status: DC | PRN
  Administered 2019-05-27 (×4): 40 ug via INTRAVENOUS

## 2019-05-27 MED ORDER — CEFAZOLIN SODIUM-DEXTROSE 2-4 GM/100ML-% IV SOLN
2.0000 g | INTRAVENOUS | Status: AC
  Administered 2019-05-27: 10:00:00 2 g via INTRAVENOUS
  Filled 2019-05-27: qty 100

## 2019-05-27 MED ORDER — ONDANSETRON HCL 4 MG PO TABS: 4.0000 mg | ORAL_TABLET | Freq: Four times a day (QID) | ORAL | Status: DC | PRN

## 2019-05-27 MED ORDER — VANCOMYCIN HCL IN DEXTROSE 1-5 GM/200ML-% IV SOLN
1000.0000 mg | Freq: Two times a day (BID) | INTRAVENOUS | Status: AC
  Administered 2019-05-27: 1000 mg via INTRAVENOUS
  Filled 2019-05-27: qty 200

## 2019-05-27 MED ORDER — CLONIDINE HCL 0.1 MG PO TABS
0.3000 mg | ORAL_TABLET | Freq: Two times a day (BID) | ORAL | Status: DC
  Administered 2019-05-27 – 2019-05-28 (×2): 0.3 mg via ORAL
  Filled 2019-05-27 (×2): qty 3

## 2019-05-27 MED ORDER — METHOCARBAMOL 500 MG IVPB - SIMPLE MED
500.0000 mg | Freq: Four times a day (QID) | INTRAVENOUS | Status: DC | PRN
  Filled 2019-05-27: qty 50

## 2019-05-27 MED ORDER — SODIUM CHLORIDE 0.9 % IR SOLN
Status: DC | PRN
  Administered 2019-05-27: 1000 mL

## 2019-05-27 MED ORDER — FENTANYL CITRATE (PF) 100 MCG/2ML IJ SOLN
50.0000 ug | INTRAMUSCULAR | Status: DC
  Administered 2019-05-27: 50 ug via INTRAVENOUS

## 2019-05-27 MED ORDER — BUPIVACAINE LIPOSOME 1.3 % IJ SUSP
INTRAMUSCULAR | Status: DC | PRN
  Administered 2019-05-27: 10 mL

## 2019-05-27 MED ORDER — ASPIRIN EC 325 MG PO TBEC: 325.0000 mg | DELAYED_RELEASE_TABLET | Freq: Two times a day (BID) | ORAL | 0 refills | Status: DC

## 2019-05-27 MED ORDER — FENTANYL CITRATE (PF) 100 MCG/2ML IJ SOLN
INTRAMUSCULAR | Status: AC
  Filled 2019-05-27: qty 2

## 2019-05-27 MED ORDER — DEXAMETHASONE SODIUM PHOSPHATE 10 MG/ML IJ SOLN
INTRAMUSCULAR | Status: DC | PRN
  Administered 2019-05-27: 8 mg via INTRAVENOUS

## 2019-05-27 MED ORDER — CELECOXIB 200 MG PO CAPS
200.0000 mg | ORAL_CAPSULE | Freq: Two times a day (BID) | ORAL | Status: DC
  Administered 2019-05-27 – 2019-05-28 (×2): 200 mg via ORAL
  Filled 2019-05-27 (×2): qty 1

## 2019-05-27 MED ORDER — ONDANSETRON HCL 4 MG/2ML IJ SOLN
INTRAMUSCULAR | Status: DC | PRN
  Administered 2019-05-27: 4 mg via INTRAVENOUS

## 2019-05-27 MED ORDER — BISACODYL 5 MG PO TBEC: 5.0000 mg | DELAYED_RELEASE_TABLET | Freq: Every day | ORAL | Status: DC | PRN

## 2019-05-27 MED ORDER — BUPIVACAINE-EPINEPHRINE (PF) 0.5% -1:200000 IJ SOLN
INTRAMUSCULAR | Status: AC
  Filled 2019-05-27: qty 1.8

## 2019-05-27 MED ORDER — BUPIVACAINE-EPINEPHRINE 0.5% -1:200000 IJ SOLN
INTRAMUSCULAR | Status: DC | PRN
  Administered 2019-05-27: 30 mL

## 2019-05-27 MED ORDER — ZOLPIDEM TARTRATE 10 MG PO TABS
10.0000 mg | ORAL_TABLET | Freq: Every day | ORAL | Status: DC
  Administered 2019-05-27: 10 mg via ORAL
  Filled 2019-05-27: qty 1

## 2019-05-27 MED ORDER — CHLORHEXIDINE GLUCONATE 4 % EX LIQD: 60.0000 mL | Freq: Once | CUTANEOUS | Status: DC

## 2019-05-27 MED ORDER — PROPOFOL 10 MG/ML IV BOLUS
INTRAVENOUS | Status: AC
  Filled 2019-05-27: qty 60

## 2019-05-27 MED ORDER — EPHEDRINE 5 MG/ML INJ
INTRAVENOUS | Status: AC
  Filled 2019-05-27: qty 10

## 2019-05-27 MED ORDER — DIPHENHYDRAMINE HCL 12.5 MG/5ML PO ELIX: 12.5000 mg | ORAL_SOLUTION | ORAL | Status: DC | PRN

## 2019-05-27 MED ORDER — OXYCODONE-ACETAMINOPHEN 5-325 MG PO TABS: 1.0000 | ORAL_TABLET | Freq: Four times a day (QID) | ORAL | 0 refills | Status: DC | PRN

## 2019-05-27 MED ORDER — ZOLPIDEM TARTRATE 5 MG PO TABS: 15.0000 mg | ORAL_TABLET | Freq: Every day | ORAL | Status: DC

## 2019-05-27 MED ORDER — PROPOFOL 10 MG/ML IV BOLUS
INTRAVENOUS | Status: DC | PRN
  Administered 2019-05-27: 30 mg via INTRAVENOUS

## 2019-05-27 MED ORDER — DOCUSATE SODIUM 100 MG PO CAPS: 100.0000 mg | ORAL_CAPSULE | Freq: Two times a day (BID) | ORAL | 0 refills | Status: DC

## 2019-05-27 MED ORDER — HYDROMORPHONE HCL 1 MG/ML IJ SOLN: 0.2500 mg | INTRAMUSCULAR | Status: DC | PRN

## 2019-05-27 MED ORDER — HYDROXYZINE HCL 10 MG PO TABS
10.0000 mg | ORAL_TABLET | Freq: Three times a day (TID) | ORAL | Status: DC
  Administered 2019-05-27 – 2019-05-28 (×3): 10 mg via ORAL
  Filled 2019-05-27 (×5): qty 1

## 2019-05-27 MED ORDER — AMPHETAMINE-DEXTROAMPHETAMINE 10 MG PO TABS: 15.0000 mg | ORAL_TABLET | Freq: Every day | ORAL | Status: DC

## 2019-05-27 MED ORDER — ACETAMINOPHEN 325 MG PO TABS: 325.0000 mg | ORAL_TABLET | Freq: Four times a day (QID) | ORAL | Status: DC | PRN

## 2019-05-27 MED ORDER — VANCOMYCIN HCL IN DEXTROSE 1-5 GM/200ML-% IV SOLN
1000.0000 mg | INTRAVENOUS | Status: AC
  Administered 2019-05-27: 09:00:00 1000 mg via INTRAVENOUS
  Filled 2019-05-27: qty 200

## 2019-05-27 MED ORDER — MAGNESIUM CITRATE PO SOLN: 1.0000 | Freq: Once | ORAL | Status: DC | PRN

## 2019-05-27 MED ORDER — AMPHETAMINE-DEXTROAMPHETAMINE 10 MG PO TABS: 30.0000 mg | ORAL_TABLET | Freq: Two times a day (BID) | ORAL | Status: DC

## 2019-05-27 MED ORDER — SODIUM CHLORIDE 0.9 % IV SOLN
INTRAVENOUS | Status: DC | PRN
  Administered 2019-05-27: 10:00:00 30 ug/min via INTRAVENOUS

## 2019-05-27 MED ORDER — DEXAMETHASONE SODIUM PHOSPHATE 10 MG/ML IJ SOLN
INTRAMUSCULAR | Status: AC
  Filled 2019-05-27: qty 1

## 2019-05-27 MED ORDER — POLYETHYLENE GLYCOL 3350 17 G PO PACK: 17.0000 g | PACK | Freq: Every day | ORAL | Status: DC | PRN

## 2019-05-27 MED ORDER — DOCUSATE SODIUM 100 MG PO CAPS
100.0000 mg | ORAL_CAPSULE | Freq: Two times a day (BID) | ORAL | Status: DC
  Administered 2019-05-27 – 2019-05-28 (×2): 100 mg via ORAL
  Filled 2019-05-27 (×2): qty 1

## 2019-05-27 MED ORDER — LIDOCAINE 2% (20 MG/ML) 5 ML SYRINGE
INTRAMUSCULAR | Status: AC
  Filled 2019-05-27: qty 5

## 2019-05-27 MED ORDER — OXYCODONE HCL 5 MG PO TABS
5.0000 mg | ORAL_TABLET | ORAL | Status: DC | PRN
  Administered 2019-05-27: 5 mg via ORAL
  Administered 2019-05-27 – 2019-05-28 (×3): 10 mg via ORAL
  Filled 2019-05-27 (×3): qty 2
  Filled 2019-05-27: qty 1

## 2019-05-27 MED ORDER — BUPIVACAINE-EPINEPHRINE 0.5% -1:200000 IJ SOLN
INTRAMUSCULAR | Status: AC
  Filled 2019-05-27: qty 1

## 2019-05-27 MED ORDER — EPHEDRINE SULFATE-NACL 50-0.9 MG/10ML-% IV SOSY
PREFILLED_SYRINGE | INTRAVENOUS | Status: DC | PRN
  Administered 2019-05-27 (×6): 10 mg via INTRAVENOUS
  Administered 2019-05-27: 15 mg via INTRAVENOUS

## 2019-05-27 MED ORDER — HYDROMORPHONE HCL 1 MG/ML IJ SOLN
0.5000 mg | INTRAMUSCULAR | Status: DC | PRN
  Administered 2019-05-27: 1 mg via INTRAVENOUS
  Administered 2019-05-27: 16:00:00 0.5 mg via INTRAVENOUS
  Filled 2019-05-27 (×2): qty 1

## 2019-05-27 MED ORDER — MIDAZOLAM HCL 2 MG/2ML IJ SOLN
INTRAMUSCULAR | Status: AC
  Filled 2019-05-27: qty 2

## 2019-05-27 MED ORDER — ACETAMINOPHEN 500 MG PO TABS
1000.0000 mg | ORAL_TABLET | Freq: Once | ORAL | Status: AC
  Administered 2019-05-27: 1000 mg via ORAL
  Filled 2019-05-27: qty 2

## 2019-05-27 MED ORDER — METHOCARBAMOL 500 MG PO TABS
500.0000 mg | ORAL_TABLET | Freq: Four times a day (QID) | ORAL | Status: DC | PRN
  Administered 2019-05-27: 500 mg via ORAL
  Filled 2019-05-27: qty 1

## 2019-05-27 MED ORDER — AMPHETAMINE-DEXTROAMPHETAMINE 30 MG PO TABS: 30.0000 mg | ORAL_TABLET | Freq: Two times a day (BID) | ORAL | Status: DC

## 2019-05-27 MED ORDER — BUPIVACAINE IN DEXTROSE 0.75-8.25 % IT SOLN
INTRATHECAL | Status: DC | PRN
  Administered 2019-05-27: 1.8 mL via INTRATHECAL

## 2019-05-27 MED ORDER — PROPOFOL 500 MG/50ML IV EMUL
INTRAVENOUS | Status: DC | PRN
  Administered 2019-05-27: 75 ug/kg/min via INTRAVENOUS

## 2019-05-27 MED ORDER — KETOROLAC TROMETHAMINE 30 MG/ML IJ SOLN: 30.0000 mg | Freq: Once | INTRAMUSCULAR | Status: DC | PRN

## 2019-05-27 MED ORDER — METOPROLOL SUCCINATE ER 50 MG PO TB24
50.0000 mg | ORAL_TABLET | Freq: Once | ORAL | Status: AC
  Administered 2019-05-27: 50 mg via ORAL
  Filled 2019-05-27: qty 1

## 2019-05-27 MED ORDER — METOPROLOL SUCCINATE ER 50 MG PO TB24
50.0000 mg | ORAL_TABLET | Freq: Every day | ORAL | Status: DC
  Administered 2019-05-28: 50 mg via ORAL
  Filled 2019-05-27 (×2): qty 1

## 2019-05-27 MED ORDER — SODIUM CHLORIDE 0.9 % IV SOLN
INTRAVENOUS | Status: DC
  Administered 2019-05-27 – 2019-05-28 (×2): via INTRAVENOUS

## 2019-05-27 MED ORDER — PROMETHAZINE HCL 25 MG/ML IJ SOLN: 6.2500 mg | INTRAMUSCULAR | Status: DC | PRN

## 2019-05-27 MED ORDER — FLUOXETINE HCL 20 MG PO CAPS
20.0000 mg | ORAL_CAPSULE | Freq: Two times a day (BID) | ORAL | Status: DC
  Administered 2019-05-27 – 2019-05-28 (×2): 20 mg via ORAL
  Filled 2019-05-27 (×2): qty 1

## 2019-05-27 MED ORDER — TRANEXAMIC ACID-NACL 1000-0.7 MG/100ML-% IV SOLN
1000.0000 mg | INTRAVENOUS | Status: AC
  Administered 2019-05-27: 1000 mg via INTRAVENOUS
  Filled 2019-05-27: qty 100

## 2019-05-27 MED ORDER — LACTATED RINGERS IV SOLN
INTRAVENOUS | Status: DC
  Administered 2019-05-27: 08:00:00 via INTRAVENOUS

## 2019-05-27 MED ORDER — PREGABALIN 100 MG PO CAPS
300.0000 mg | ORAL_CAPSULE | Freq: Every day | ORAL | Status: DC
  Administered 2019-05-27: 300 mg via ORAL
  Filled 2019-05-27: qty 3

## 2019-05-27 MED ORDER — ONDANSETRON HCL 4 MG/2ML IJ SOLN: 4.0000 mg | Freq: Four times a day (QID) | INTRAMUSCULAR | Status: DC | PRN

## 2019-05-27 MED ORDER — FENTANYL CITRATE (PF) 100 MCG/2ML IJ SOLN
INTRAMUSCULAR | Status: DC | PRN
  Administered 2019-05-27 (×2): 50 ug via INTRAVENOUS

## 2019-05-27 MED ORDER — ONDANSETRON HCL 4 MG/2ML IJ SOLN
INTRAMUSCULAR | Status: AC
  Filled 2019-05-27: qty 2

## 2019-05-27 MED ORDER — BUPIVACAINE LIPOSOME 1.3 % IJ SUSP
10.0000 mL | Freq: Once | INTRAMUSCULAR | Status: DC
  Filled 2019-05-27: qty 10

## 2019-05-27 MED ORDER — TRANEXAMIC ACID-NACL 1000-0.7 MG/100ML-% IV SOLN
1000.0000 mg | Freq: Once | INTRAVENOUS | Status: AC
  Administered 2019-05-27: 1000 mg via INTRAVENOUS
  Filled 2019-05-27: qty 100

## 2019-05-27 MED ORDER — MIDAZOLAM HCL 2 MG/2ML IJ SOLN
INTRAMUSCULAR | Status: DC | PRN
  Administered 2019-05-27 (×2): 1 mg via INTRAVENOUS

## 2019-05-27 MED ORDER — WATER FOR IRRIGATION, STERILE IR SOLN
Status: DC | PRN
  Administered 2019-05-27: 2000 mL

## 2019-05-27 MED ORDER — POVIDONE-IODINE 10 % EX SWAB
2.0000 "application " | Freq: Once | CUTANEOUS | Status: AC
  Administered 2019-05-27: 2 via TOPICAL

## 2019-05-27 MED ORDER — DEXAMETHASONE SODIUM PHOSPHATE 10 MG/ML IJ SOLN: 10.0000 mg | Freq: Two times a day (BID) | INTRAMUSCULAR | Status: DC

## 2019-05-27 MED ORDER — ALUM & MAG HYDROXIDE-SIMETH 200-200-20 MG/5ML PO SUSP: 30.0000 mL | ORAL | Status: DC | PRN

## 2019-05-27 SURGICAL SUPPLY — 38 items
BAG ZIPLOCK 12X15 (MISCELLANEOUS) IMPLANT
BENZOIN TINCTURE PRP APPL 2/3 (GAUZE/BANDAGES/DRESSINGS) ×2 IMPLANT
BLADE SAW SGTL 18X1.27X75 (BLADE) ×2 IMPLANT
BLADE SURG SZ10 CARB STEEL (BLADE) ×4 IMPLANT
CLSR STERI-STRIP ANTIMIC 1/2X4 (GAUZE/BANDAGES/DRESSINGS) ×2 IMPLANT
COVER PERINEAL POST (MISCELLANEOUS) ×2 IMPLANT
COVER SURGICAL LIGHT HANDLE (MISCELLANEOUS) ×2 IMPLANT
COVER WAND RF STERILE (DRAPES) IMPLANT
CUP ACET GRIPTION SERIS 56 100 (Trauma) ×1 IMPLANT
DRAPE STERI IOBAN 125X83 (DRAPES) ×2 IMPLANT
DRAPE U-SHAPE 47X51 STRL (DRAPES) ×4 IMPLANT
DRSG AQUACEL AG ADV 3.5X 6 (GAUZE/BANDAGES/DRESSINGS) ×2 IMPLANT
DURAPREP 26ML APPLICATOR (WOUND CARE) ×2 IMPLANT
ELECT BLADE TIP CTD 4 INCH (ELECTRODE) ×2 IMPLANT
ELECT REM PT RETURN 15FT ADLT (MISCELLANEOUS) ×2 IMPLANT
ELIMINATOR HOLE APEX DEPUY (Hips) ×2 IMPLANT
GAUZE XEROFORM 1X8 LF (GAUZE/BANDAGES/DRESSINGS) IMPLANT
GIPTION SERIES 56 100 (Trauma) ×2 IMPLANT
GLOVE BIOGEL PI IND STRL 8 (GLOVE) ×2 IMPLANT
GLOVE BIOGEL PI INDICATOR 8 (GLOVE) ×2
GLOVE ECLIPSE 7.5 STRL STRAW (GLOVE) ×4 IMPLANT
GOWN STRL REUS W/TWL XL LVL3 (GOWN DISPOSABLE) ×4 IMPLANT
HEAD CERAMIC BIO DELTA 36 (Hips) ×2 IMPLANT
HOLDER FOLEY CATH W/STRAP (MISCELLANEOUS) ×2 IMPLANT
HOOD PEEL AWAY FLYTE STAYCOOL (MISCELLANEOUS) ×4 IMPLANT
KIT TURNOVER KIT A (KITS) IMPLANT
LINER NEUTRAL 52MMX36MMX56N (Liner) ×2 IMPLANT
NEEDLE HYPO 22GX1.5 SAFETY (NEEDLE) ×2 IMPLANT
PACK ANTERIOR HIP CUSTOM (KITS) ×2 IMPLANT
STAPLER VISISTAT 35W (STAPLE) IMPLANT
STEM CORAIL KA13 (Stem) ×2 IMPLANT
STRIP CLOSURE SKIN 1/2X4 (GAUZE/BANDAGES/DRESSINGS) IMPLANT
SUT ETHIBOND NAB CT1 #1 30IN (SUTURE) ×4 IMPLANT
SUT MNCRL AB 3-0 PS2 18 (SUTURE) IMPLANT
SUT VIC AB 0 CT1 36 (SUTURE) ×2 IMPLANT
SUT VIC AB 1 CT1 36 (SUTURE) ×2 IMPLANT
TRAY FOLEY MTR SLVR 16FR STAT (SET/KITS/TRAYS/PACK) ×2 IMPLANT
YANKAUER SUCT BULB TIP 10FT TU (MISCELLANEOUS) ×2 IMPLANT

## 2019-05-27 NOTE — Brief Op Note (Signed)
05/27/2019  11:33 AM  PATIENT:  David [name]  63 y.o. male  PRE-OPERATIVE DIAGNOSIS:  LEFT ANTERIOR TOTAL HIP ARTHROPLASTY  POST-OPERATIVE DIAGNOSIS:  left anterior total hip antrhoplasty  PROCEDURE:  Procedure(s): TOTAL HIP ARTHROPLASTY ANTERIOR APPROACH (Left)  SURGEON:  Surgeon(s) and Role:    Dorna Leitz, MD - Primary  PHYSICIAN ASSISTANT:   ASSISTANTS: Gaspar Skeeters  ANESTHESIA:   spinal  EBL:  225 mL   BLOOD ADMINISTERED:none  DRAINS: none   LOCAL MEDICATIONS USED:  MARCAINE    and OTHER experel  SPECIMEN:  No Specimen  DISPOSITION OF SPECIMEN:  N/A  COUNTS:  YES  TOURNIQUET:  * No tourniquets in log *  DICTATION: .Other Dictation: Dictation Number [id number]  PLAN OF CARE: Admit to inpatient   PATIENT DISPOSITION:  PACU - hemodynamically stable.   Delay start of Pharmacological VTE agent (>24hrs) due to surgical blood loss or risk of bleeding: no

## 2019-05-27 NOTE — Anesthesia Procedure Notes (Signed)
Spinal  Patient location during procedure: OR Start time: 05/27/2019 9:50 AM End time: 05/27/2019 10:00 AM Staffing Anesthesiologist: Murvin Natal, MD Performed: anesthesiologist  Preanesthetic Checklist Completed: patient identified, surgical consent, pre-op evaluation, timeout performed, IV checked, risks and benefits discussed and monitors and equipment checked Spinal Block Patient position: sitting Prep: DuraPrep Patient monitoring: cardiac monitor, continuous pulse ox and blood pressure Approach: midline Location: L4-5 Injection technique: single-shot Needle Needle type: Pencan  Needle gauge: 24 G Needle length: 9 cm Assessment Sensory level: T10 Additional Notes Functioning IV was confirmed and monitors were applied. Sterile prep and drape, including hand hygiene and sterile gloves were used. The patient was positioned and the spine was prepped. The skin was anesthetized with lidocaine.  Free flow of clear CSF was obtained prior to injecting local anesthetic into the CSF.  The spinal needle aspirated freely following injection.  The needle was carefully withdrawn.  The patient tolerated the procedure well.

## 2019-05-27 NOTE — Anesthesia Preprocedure Evaluation (Addendum)
Anesthesia Evaluation  Patient identified by MRN, date of birth, ID band Patient awake    Reviewed: Allergy & Precautions, NPO status , Patient's Chart, lab work & pertinent test results  Airway Mallampati: II  TM Distance: >3 FB Neck ROM: Full    Dental  (+) Edentulous Upper, Edentulous Lower   Pulmonary former smoker,    Pulmonary exam normal breath sounds clear to auscultation       Cardiovascular hypertension, Pt. on home beta blockers Normal cardiovascular exam Rhythm:Regular Rate:Normal  ECG: SR, rate 85   Neuro/Psych PSYCHIATRIC DISORDERS Anxiety Depression Bipolar Disorder ADD (attention deficit disorder) PTSD (post-traumatic stress disorder)negative neurological ROS     GI/Hepatic negative GI ROS, Neg liver ROS,   Endo/Other  negative endocrine ROS  Renal/GU negative Renal ROS     Musculoskeletal  (+) Arthritis ,   Abdominal   Peds  Hematology negative hematology ROS (+)   Anesthesia Other Findings LEFT ANTERIOR TOTAL HIP ARTHROPLASTY  Reproductive/Obstetrics                            Anesthesia Physical Anesthesia Plan  ASA: II  Anesthesia Plan: Spinal   Post-op Pain Management:    Induction: Intravenous  PONV Risk Score and Plan: 2 and Ondansetron, Dexamethasone, Midazolam and Treatment may vary due to age or medical condition  Airway Management Planned: Simple Face Mask  Additional Equipment:   Intra-op Plan:   Post-operative Plan:   Informed Consent: I have reviewed the patients History and Physical, chart, labs and discussed the procedure including the risks, benefits and alternatives for the proposed anesthesia with the patient or authorized representative who has indicated his/her understanding and acceptance.     Dental advisory given  Plan Discussed with: CRNA  Anesthesia Plan Comments:        Anesthesia Quick Evaluation

## 2019-05-27 NOTE — Anesthesia Postprocedure Evaluation (Signed)
Anesthesia Post Note  Patient: YANIEL FOSSELMAN  Procedure(s) Performed: TOTAL HIP ARTHROPLASTY ANTERIOR APPROACH (Left Hip)     Patient location during evaluation: PACU Anesthesia Type: Spinal Level of consciousness: oriented and awake and alert Pain management: pain level controlled Vital Signs Assessment: post-procedure vital signs reviewed and stable Respiratory status: spontaneous breathing, respiratory function stable and patient connected to nasal cannula oxygen Cardiovascular status: blood pressure returned to baseline and stable Postop Assessment: no headache, no backache, no apparent nausea or vomiting and spinal receding Anesthetic complications: no    Last Vitals:  Vitals:   05/27/19 1630 05/27/19 1713  BP: (!) 166/93 (!) 149/72  Pulse:  65  Resp:  18  Temp:  36.5 C  SpO2:  99%    Last Pain:  Vitals:   05/27/19 1713  TempSrc: Oral  PainSc:                  Ryan P Ellender

## 2019-05-27 NOTE — Anesthesia Procedure Notes (Signed)
Performed by: Caydence Koenig B, CRNA       

## 2019-05-27 NOTE — Transfer of Care (Signed)
Immediate Anesthesia Transfer of Care Note  Patient: David [name]  Procedure(s) Performed: TOTAL HIP ARTHROPLASTY ANTERIOR APPROACH (Left Hip)  Patient Location: PACU  Anesthesia Type:Spinal  Level of Consciousness: drowsy  Airway & Oxygen Therapy: Patient Spontanous Breathing and Patient connected to face mask oxygen  Post-op Assessment: Report given to RN and Post -op Vital signs reviewed and stable  Post vital signs: Reviewed and stable  Last Vitals:  Vitals Value Taken Time  BP    Temp    Pulse    Resp    SpO2      Last Pain:  Vitals:   05/27/19 0757  TempSrc: Oral         Complications: No apparent anesthesia complications

## 2019-05-27 NOTE — Plan of Care (Signed)
Plan of care 

## 2019-05-27 NOTE — Evaluation (Signed)
Physical Therapy Evaluation Patient Details Name: David [name] MRN: [id number] DOB: [birth year] Today's Date: 05/27/2019   History of Present Illness  63 yo male s/p L DA-THA on 05/27/19. PMH includes GAD, ADD, HTN, depression/anxiety, alcohol abuse, bipolar disorder, PTSD, R hip pinning 06/24/18.  Clinical Impression   Pt presents with L hip pain, decreased hip strength post-operatively, increased time and effort to perform mobility tasks, and decreased activity tolerance due to pain. Pt to benefit from acute PT to address deficits. Pt ambulated 42 ft with RW with min guard assist, verbal cuing for form and safety provided. Pt limited by antalgic gait. Pt educated on ankle pumps (20/hour) to perform this afternoon/evening to increase circulation, to pt's tolerance and limited by pain. PT to progress mobility as tolerated, and will continue to follow acutely.        Follow Up Recommendations Follow surgeon's recommendation for DC plan and follow-up therapies;Supervision for mobility/OOB(HHPT)    Equipment Recommendations  3in1 (PT)    Recommendations for Other Services       Precautions / Restrictions Precautions Precautions: Fall Restrictions Weight Bearing Restrictions: No Other Position/Activity Restrictions: WBAT      Mobility  Bed Mobility Overal bed mobility: Needs Assistance Bed Mobility: Supine to Sit     Supine to sit: Min assist;HOB elevated     General bed mobility comments: Min assist for LLE lifting and translation to EOB. Increased time with use of bed rails.  Transfers Overall transfer level: Needs assistance Equipment used: Rolling walker (2 wheeled) Transfers: Sit to/from Stand Sit to Stand: Min guard;From elevated surface         General transfer comment: min guard for safety, verbal cuing for hand placement when rising.  Ambulation/Gait Ambulation/Gait assistance: Min guard Gait Distance (Feet): 45 Feet Assistive device: Rolling walker (2  wheeled) Gait Pattern/deviations: Step-to pattern;Step-through pattern;Decreased stride length;Trunk flexed;Antalgic;Decreased stance time - left;Decreased weight shift to left Gait velocity: decr   General Gait Details: Min guard for safety, verbal cuing for sequencing, placement in RW, upright posture. Pt limited in distance due to severe L hip pain.  Stairs            Wheelchair Mobility    Modified Rankin (Stroke Patients Only)       Balance Overall balance assessment: Mild deficits observed, not formally tested                                           Pertinent Vitals/Pain Pain Assessment: 0-10 Pain Score: 7  Pain Location: L hip Pain Descriptors / Indicators: Sore Pain Intervention(s): Limited activity within patient's tolerance;Monitored during session;Premedicated before session;Repositioned;Ice applied    Home Living Family/patient expects to be discharged to:: Private residence Living Arrangements: Spouse/significant other Available Help at Discharge: Family Type of Home: House Home Access: Stairs to enter   Technical brewer of Steps: 1 Home Layout: One level Home Equipment: [place] - single point;Walker - 2 wheels;Wheelchair - manual      Prior Function Level of Independence: Independent with assistive device(s)         Comments: pt using RW PTA due to severe L hip pain     Hand Dominance   Dominant Hand: Right    Extremity/Trunk Assessment   Upper Extremity Assessment Upper Extremity Assessment: Overall WFL for tasks assessed    Lower Extremity Assessment Lower Extremity Assessment: Overall WFL for tasks assessed  Cervical / Trunk Assessment Cervical / Trunk Assessment: Normal  Communication   Communication: No difficulties  Cognition Arousal/Alertness: Awake/alert Behavior During Therapy: WFL for tasks assessed/performed Overall Cognitive Status: Within Functional Limits for tasks assessed                                         General Comments      Exercises     Assessment/Plan    PT Assessment Patient needs continued PT services  PT Problem List Decreased strength;Decreased mobility;Decreased range of motion;Decreased activity tolerance;Decreased balance;Decreased knowledge of use of DME;Pain       PT Treatment Interventions DME instruction;Therapeutic activities;Gait training;Therapeutic exercise;Patient/family education;Balance training;Stair training;Functional mobility training    PT Goals (Current goals can be found in the Care Plan section)  Acute Rehab PT Goals PT Goal Formulation: With patient Time For Goal Achievement: 06/03/19 Potential to Achieve Goals: Good    Frequency 7X/week   Barriers to discharge        Co-evaluation               AM-PAC PT "6 Clicks" Mobility  Outcome Measure Help needed turning from your back to your side while in a flat bed without using bedrails?: A Little Help needed moving from lying on your back to sitting on the side of a flat bed without using bedrails?: A Little Help needed moving to and from a bed to a chair (including a wheelchair)?: A Little Help needed standing up from a chair using your arms (e.g., wheelchair or bedside chair)?: A Little Help needed to walk in hospital room?: A Little Help needed climbing 3-5 steps with a railing? : A Lot 6 Click Score: 17    End of Session Equipment Utilized During Treatment: Gait belt Activity Tolerance: Patient tolerated treatment well;Patient limited by pain Patient left: in chair;with chair alarm set;with call bell/phone within reach;with SCD's reapplied Nurse Communication: Mobility status PT Visit Diagnosis: Other abnormalities of gait and mobility (R26.89);Difficulty in walking, not elsewhere classified (R26.2)    Time: [id number] PT Time Calculation (min) (ACUTE ONLY): 24 min   Charges:   PT Evaluation $PT Eval Low Complexity: 1 Low PT  Treatments $Gait Training: 8-22 mins        Julien Girt, PT Acute Rehabilitation Services Pager [phone]  Office [phone]   Taunia Frasco D Elonda Husky 05/27/2019, 5:38 PM

## 2019-05-27 NOTE — Discharge Instructions (Signed)

## 2019-05-27 NOTE — H&P (Signed)
TOTAL HIP ADMISSION H&P  Patient is admitted for left total hip arthroplasty.  Subjective:  Chief Complaint: left hip pain  HPI: David [name], 63 y.o. male, has a history of pain and functional disability in the left hip(s) due to arthritis and patient has failed non-surgical conservative treatments for greater than 12 weeks to include NSAID's and/or analgesics, viscosupplementation injections, flexibility and strengthening excercises and activity modification.  Onset of symptoms was gradual starting 3 years ago with gradually worsening course since that time.The patient noted no past surgery on the left hip(s).  Patient currently rates pain in the left hip at 10 out of 10 with activity. Patient has night pain, worsening of pain with activity and weight bearing, trendelenberg gait, pain that interfers with activities of daily living, pain with passive range of motion and joint swelling. Patient has evidence of subchondral sclerosis, periarticular osteophytes, joint subluxation and joint space narrowing by imaging studies. This condition presents safety issues increasing the risk of falls. This patient has had Failure of all reasonable conservative care.  There is no current active infection.  Patient Active Problem List   Diagnosis Date Noted  . GAD (generalized anxiety disorder) 08/03/2018  . ADD (attention deficit disorder) 08/03/2018  . Closed right hip fracture, initial encounter ([place]) 06/23/2018  . HTN (hypertension) 06/23/2018  . Hypertensive urgency 06/23/2018  . Depression with anxiety 06/23/2018  . Microcytic anemia 06/23/2018  . Alcohol abuse with alcohol-induced mood disorder ([place]) 03/16/2018   Past Medical History:  Diagnosis Date  . ADD (attention deficit disorder)   . Anemia    Microcytic  . Arthritis   . Bipolar disorder ([place])   . Depression   . Dyspnea    history of no current issues 05/26/2019  . GAD (generalized anxiety disorder)   . History of kidney stones    passed  . HTN (hypertension)   . PTSD (post-traumatic stress disorder)   . Skin cancer     Past Surgical History:  Procedure Laterality Date  . BACK SURGERY     Fusion - lumbar  . HIP PINNING,CANNULATED Right 06/24/2018   Procedure: RIGHT CANNULATED HIP PINNING;  Surgeon: Erle Crocker, MD;  Location: [facility];  Service: Orthopedics;  Laterality: Right;  . LITHOTRIPSY    . MULTIPLE EXTRACTIONS WITH ALVEOLOPLASTY Bilateral 05/03/2019   Procedure: MULTIPLE EXTRACTION TEETH NUMBER TWO, THREE, FIVE, SIX, SEVEN, EIGHT, NINE, TEN, ELEVEN, FIFTEEN, SIXTEEN, SEVENTEEN, NINETEEN, TWENTY, TWENTY-ONE, TWENTY-TWO, TWENTY-THREE, TWENTY-FOUR, TWENTY-FIVE, TWENTY-SIX, TWENTY-SEVEN, TWENTY-EIGHT, TWENTY-NINE, THIRTY-TWO WITH ALVEOLOPLASTY;  Surgeon: Diona Browner, DDS;  Location: [facility];  Service: Oral Surgery;  Laterality: Bilateral;  . NOSE SURGERY     x3    Current Facility-Administered Medications  Medication Dose Route Frequency Provider Last Rate Last Dose  . bupivacaine liposome (EXPAREL) 1.3 % injection 266 mg  20 mL Other On Call to OR Dorna Leitz, MD      . ceFAZolin (ANCEF) IVPB 2g/100 mL premix  2 g Intravenous On Call to OR Dorna Leitz, MD      . chlorhexidine (HIBICLENS) 4 % liquid 4 application  60 mL Topical Once Dorna Leitz, MD      . fentaNYL (SUBLIMAZE) 100 MCG/2ML injection           . fentaNYL (SUBLIMAZE) injection 50 mcg  50 mcg Intravenous Q15 min Ellender, Karyl Kinnier, MD   50 mcg at 05/27/19 0856  . lactated ringers infusion   Intravenous Continuous Ellender, Karyl Kinnier, MD 50 mL/hr at 05/27/19 0754    .  tranexamic acid (CYKLOKAPRON) IVPB 1,000 mg  1,000 mg Intravenous To OR Phillippe Orlick, MD      . vancomycin (VANCOCIN) IVPB 1000 mg/200 mL premix  1,000 mg Intravenous On Call to OR Dorna Leitz, MD 200 mL/hr at 05/27/19 0850 1,000 mg at 05/27/19 0850   Allergies  Allergen Reactions  . Codeine Nausea Only    Social History   Tobacco Use  . Smoking status: Former Smoker     Packs/day: 1.00    Years: 40.00    Pack years: 40.00    Quit date: 04/25/2019    Years since quitting: 0.0  . Smokeless tobacco: Never Used  Substance Use Topics  . Alcohol use: Yes    Alcohol/week: 12.0 standard drinks    Types: 12 Cans of beer per week    Family History  Problem Relation Age of Onset  . Arthritis Other   . Heart disease Other   . Cancer Other   . Hypertension Other      ROS ROS: I have reviewed the patient's review of systems thoroughly and there are no positive responses as relates to the HPI. Objective:  Physical Exam  Vital signs in last 24 hours: Temp:  [97.8 F (36.6 C)] 97.8 F (36.6 C) (09/11 0757) Pulse Rate:  [65] 65 (09/11 0757) Resp:  [20] 20 (09/11 0757) BP: (179)/(106) 179/106 (09/11 0757) SpO2:  [100 %] 100 % (09/11 0757) Well-developed well-nourished patient in no acute distress. Alert and oriented x3 HEENT:within normal limits Cardiac: Regular rate and rhythm Pulmonary: Lungs clear to auscultation Abdomen: Soft and nontender.  Normal active bowel sounds  Musculoskeletal: (Left hip: Painful range of motion.  Limited range of motion.  No instability.  Neurovascular intact distally.) Labs: Recent Results (from the past 2160 hour(s))  SARS CORONAVIRUS 2 Nasal Swab Aptima Multi Swab     Status: None   Collection Time: 04/29/19  1:47 PM   Specimen: Aptima Multi Swab; Nasal Swab  Result Value Ref Range   SARS Coronavirus 2 NEGATIVE NEGATIVE    Comment: (NOTE) SARS-CoV-2 target nucleic acids are NOT DETECTED. The SARS-CoV-2 RNA is generally detectable in upper and lower respiratory specimens during the acute phase of infection. Negative results do not preclude SARS-CoV-2 infection, do not rule out co-infections with other pathogens, and should not be used as the sole basis for treatment or other patient management decisions. Negative results must be combined with clinical observations, patient history, and epidemiological information.  The expected result is Negative. Fact Sheet for Patients: SugarRoll.be Fact Sheet for Healthcare Providers: https://www.woods-mathews.com/ This test is not yet approved or cleared by the [place] FDA and  has been authorized for detection and/or diagnosis of SARS-CoV-2 by FDA under an Emergency Use Authorization (EUA). This EUA will remain  in effect (meaning this test can be used) for the duration of the COVID-19 declaration under Section 56 4(b)(1) of the Act, 21 U.S.C. section 360bbb-3(b)(1), unless the authorization is terminated or revoked sooner. Performed at [facility], [place] [address] [place],  [address]   CBC     Status: Abnormal   Collection Time: 05/03/19  6:34 AM  Result Value Ref Range   WBC 9.5 4.0 - 10.5 K/uL   RBC 4.05 (L) 4.22 - 5.81 MIL/uL   Hemoglobin 11.3 (L) 13.0 - 17.0 g/dL   HCT 36.6 (L) 39.0 - 52.0 %   MCV 90.4 80.0 - 100.0 fL   MCH 27.9 26.0 - 34.0 pg   MCHC 30.9  30.0 - 36.0 g/dL   RDW 16.1 (H) 11.5 - 15.5 %   Platelets 421 (H) 150 - 400 K/uL   nRBC 0.0 0.0 - 0.2 %    Comment: Performed at [facility], [place] [address], [place], [place] [id number]  Basic metabolic panel     Status: Abnormal   Collection Time: 05/03/19  6:34 AM  Result Value Ref Range   Sodium 137 135 - 145 mmol/L   Potassium 3.9 3.5 - 5.1 mmol/L   Chloride 108 98 - 111 mmol/L   CO2 20 (L) 22 - 32 mmol/L   Glucose, Bld 105 (H) 70 - 99 mg/dL   BUN 14 8 - 23 mg/dL   Creatinine, Ser 1.06 0.61 - 1.24 mg/dL   Calcium 9.0 8.9 - 10.3 mg/dL   GFR calc non Af Amer >60 >60 mL/min   GFR calc Af Amer >60 >60 mL/min   Anion gap 9 5 - 15    Comment: Performed at [facility], [place] [address], [place], [place] [address]  Novel Coronavirus, NAA (Hosp order, Send-out to Ref Lab; TAT 18-24 hrs     Status: None   Collection Time: 05/24/19 12:30 PM   Specimen: Nasopharyngeal Swab; Respiratory  Result Value Ref Range    SARS-CoV-2, NAA NOT DETECTED NOT DETECTED    Comment: (NOTE) This nucleic acid amplification test was developed and its performance characteristics determined by [employer]. Nucleic acid amplification tests include PCR and TMA. This test has not been FDA cleared or approved. This test has been authorized by FDA under an Emergency Use Authorization (EUA). This test is only authorized for the duration of time the declaration that circumstances exist justifying the authorization of the emergency use of in vitro diagnostic tests for detection of SARS-CoV-2 virus and/or diagnosis of COVID-19 infection under section 564(b)(1) of the Act, 21 U.S.C. [id number]) (1), unless the authorization is terminated or revoked sooner. When diagnostic testing is negative, the possibility of a false negative result should be considered in the context of a patient's recent exposures and the presence of clinical signs and symptoms consistent with COVID-19. An individual without symptoms of COVID- 19 and who is not shedding SARS-CoV-2 vi rus would expect to have a negative (not detected) result in this assay. Performed At: [facility] [place], [place] [id number] Rush Farmer MD [id number]    Coronavirus Source NASOPHARYNGEAL     Comment: Performed at [facility], [place] [address] [place], [place] [address]  APTT     Status: None   Collection Time: 05/26/19  9:58 AM  Result Value Ref Range   aPTT 30 24 - 36 seconds    Comment: Performed at [facility], [place] [address] [place], [place] [address]  CBC WITH DIFFERENTIAL     Status: Abnormal   Collection Time: 05/26/19  9:58 AM  Result Value Ref Range   WBC 9.9 4.0 - 10.5 K/uL   RBC 4.78 4.22 - 5.81 MIL/uL   Hemoglobin 13.4 13.0 - 17.0 g/dL   HCT 43.0 39.0 - 52.0 %   MCV 90.0 80.0 - 100.0 fL   MCH 28.0 26.0 - 34.0 pg   MCHC 31.2 30.0 - 36.0 g/dL   RDW 15.8 (H) 11.5 - 15.5 %   Platelets 435  (H) 150 - 400 K/uL   nRBC 0.0 0.0 - 0.2 %   Neutrophils Relative % 53 %   Neutro Abs 5.3 1.7 - 7.7  K/uL   Lymphocytes Relative 31 %   Lymphs Abs 3.1 0.7 - 4.0 K/uL   Monocytes Relative 12 %   Monocytes Absolute 1.2 (H) 0.1 - 1.0 K/uL   Eosinophils Relative 3 %   Eosinophils Absolute 0.3 0.0 - 0.5 K/uL   Basophils Relative 1 %   Basophils Absolute 0.1 0.0 - 0.1 K/uL   Immature Granulocytes 0 %   Abs Immature Granulocytes 0.04 0.00 - 0.07 K/uL    Comment: Performed at [facility], [place] [address] [place], [place] [address]  Comprehensive metabolic panel     Status: Abnormal   Collection Time: 05/26/19  9:58 AM  Result Value Ref Range   Sodium 139 135 - 145 mmol/L   Potassium 5.4 (H) 3.5 - 5.1 mmol/L   Chloride 101 98 - 111 mmol/L   CO2 29 22 - 32 mmol/L   Glucose, Bld 110 (H) 70 - 99 mg/dL   BUN 13 8 - 23 mg/dL   Creatinine, Ser 0.91 0.61 - 1.24 mg/dL   Calcium 9.6 8.9 - 10.3 mg/dL   Total Protein 8.7 (H) 6.5 - 8.1 g/dL   Albumin 4.2 3.5 - 5.0 g/dL   AST 21 15 - 41 U/L   ALT 17 0 - 44 U/L   Alkaline Phosphatase 135 (H) 38 - 126 U/L   Total Bilirubin 0.5 0.3 - 1.2 mg/dL   GFR calc non Af Amer >60 >60 mL/min   GFR calc Af Amer >60 >60 mL/min   Anion gap 9 5 - 15    Comment: Performed at [facility], [place] [address], [place], [place] [address]  Protime-INR     Status: None   Collection Time: 05/26/19  9:58 AM  Result Value Ref Range   Prothrombin Time 13.3 11.4 - 15.2 seconds   INR 1.0 0.8 - 1.2    Comment: (NOTE) INR goal varies based on device and disease states. Performed at [facility], [place] [address], [place], [place] [address]   Type and screen Order type and screen if day of surgery is less than 15 days from draw of preadmission visit or order morning of surgery if day of surgery is greater than 6 days from preadmission visit.     Status: None   Collection Time: 05/26/19  9:58 AM  Result Value Ref Range    ABO/RH(D) O POS    Antibody Screen NEG    Sample Expiration 05/30/2019,2359    Extend sample reason      NO TRANSFUSIONS OR PREGNANCY IN THE PAST 3 MONTHS Performed at [facility], [place] [address] [place], [place] [address]   Urinalysis, Routine w reflex microscopic     Status: None   Collection Time: 05/26/19  9:58 AM  Result Value Ref Range   Color, Urine YELLOW YELLOW   APPearance CLEAR CLEAR   Specific Gravity, Urine 1.019 1.005 - 1.030   pH 6.0 5.0 - 8.0   Glucose, UA NEGATIVE NEGATIVE mg/dL   Hgb urine dipstick NEGATIVE NEGATIVE   Bilirubin Urine NEGATIVE NEGATIVE   Ketones, ur NEGATIVE NEGATIVE mg/dL   Protein, ur NEGATIVE NEGATIVE mg/dL   Nitrite NEGATIVE NEGATIVE   Leukocytes,Ua NEGATIVE NEGATIVE    Comment: Performed at [place] [address], [place],  [address]  Surgical pcr screen     Status: Abnormal   Collection Time: 05/26/19  9:58 AM   Specimen: Vein; Nasal Swab  Result Value Ref Range  MRSA, PCR NEGATIVE NEGATIVE   Staphylococcus aureus POSITIVE (A) NEGATIVE    Comment: (NOTE) The Xpert SA Assay (FDA approved for NASAL specimens in patients 23 years of age and older), is one component of a comprehensive surveillance program. It is not intended to diagnose infection nor to guide or monitor treatment. Performed at [facility], [place] [address] [place], [place] [id number]   Basic metabolic panel     Status: Abnormal   Collection Time: 05/27/19  7:50 AM  Result Value Ref Range   Sodium 137 135 - 145 mmol/L   Potassium 3.6 3.5 - 5.1 mmol/L    Comment: DELTA CHECK NOTED   Chloride 100 98 - 111 mmol/L   CO2 24 22 - 32 mmol/L   Glucose, Bld 189 (H) 70 - 99 mg/dL   BUN 10 8 - 23 mg/dL   Creatinine, Ser 0.87 0.61 - 1.24 mg/dL   Calcium 9.1 8.9 - 10.3 mg/dL   GFR calc non Af Amer >60 >60 mL/min   GFR calc Af Amer >60 >60 mL/min   Anion gap 13 5 - 15    Comment: Performed at [facility], [place] [address], [place], [place] [address]    Estimated body mass index is 22.9 kg/m as calculated from the following:   Height as of 05/26/19: 5\' 8"  (1.727 m).   Weight as of 05/26/19: 68.3 kg.   Imaging Review Plain radiographs demonstrate severe degenerative joint disease of the left hip(s). The bone quality appears to be fair for age and reported activity level.      Assessment/Plan:  End stage arthritis, left hip(s)  The patient history, physical examination, clinical judgement of the provider and imaging studies are consistent with end stage degenerative joint disease of the left hip(s) and total hip arthroplasty is deemed medically necessary. The treatment options including medical management, injection therapy, arthroscopy and arthroplasty were discussed at length. The risks and benefits of total hip arthroplasty were presented and reviewed. The risks due to aseptic loosening, infection, stiffness, dislocation/subluxation,  thromboembolic complications and other imponderables were discussed.  The patient acknowledged the explanation, agreed to proceed with the plan and consent was signed. Patient is being admitted for inpatient treatment for surgery, pain control, PT, OT, prophylactic antibiotics, VTE prophylaxis, progressive ambulation and ADL's and discharge planning.The patient is planning to be discharged home with home health services

## 2019-05-28 DIAGNOSIS — M1612 Unilateral primary osteoarthritis, left hip: Secondary | ICD-10-CM | POA: Diagnosis not present

## 2019-05-28 LAB — CBC
HCT: 31.3 % — ABNORMAL LOW (ref 39.0–52.0)
Hemoglobin: 9.7 g/dL — ABNORMAL LOW (ref 13.0–17.0)
MCH: 28 pg (ref 26.0–34.0)
MCHC: 31 g/dL (ref 30.0–36.0)
MCV: 90.5 fL (ref 80.0–100.0)
Platelets: 277 10*3/uL (ref 150–400)
RBC: 3.46 MIL/uL — ABNORMAL LOW (ref 4.22–5.81)
RDW: 15.9 % — ABNORMAL HIGH (ref 11.5–15.5)
WBC: 14.6 10*3/uL — ABNORMAL HIGH (ref 4.0–10.5)
nRBC: 0 % (ref 0.0–0.2)

## 2019-05-28 MED ORDER — METHOCARBAMOL 500 MG PO TABS: 500.0000 mg | ORAL_TABLET | Freq: Four times a day (QID) | ORAL | 0 refills | Status: DC | PRN

## 2019-05-28 MED ORDER — CELECOXIB 200 MG PO CAPS: 200.0000 mg | ORAL_CAPSULE | Freq: Two times a day (BID) | ORAL | 0 refills | Status: DC

## 2019-05-28 NOTE — Op Note (Signed)
NAME: David [name], David [name] MEDICAL RECORD [id number] ACCOUNT [id number] DATE OF BIRTH:05-19-1956 FACILITY: [facility] LOCATION: [facility] PHYSICIAN:Ahlayah Tarkowski L. Kathi Dohn, MD  OPERATIVE REPORT  DATE OF PROCEDURE:  05/27/2019  PREOPERATIVE DIAGNOSIS:  End-stage degenerative joint disease, left hip, with severe bone-on-bone change and collapse of the femoral head.  POSTOPERATIVE DIAGNOSIS:  End-stage degenerative joint disease, left hip, with severe bone-on-bone change and collapse of the femoral head.  PROCEDURE: 1.  Left total hip replacement with a Corail stem size 13, 56 mm Pinnacle Gription cup, hole-less, 36 mm +12 Delta ceramic hip ball, and a neutral liner for a 36 ball. 2.  Interpretation of multiple intraoperative fluoroscopic images.    SURGEON:  Dorna Leitz, MD  ASSISTANT:  Gaspar Skeeters PA-C, who was present for the entire case and assisted by retraction, manipulation of the leg, and closing to minimize OR time.    BRIEF HISTORY:  The patient is a 63 year old male with a long history of significant complaints of bilateral hip pain.  We were seeing him initially because he had had a hip pinning of a femoral neck fracture.  It looked like it was going on to nonunion.   CT showed that he was scheduled for right hip replacement.  He had some medical complications and ultimately missed that.  In the intervening period of time, his left hip went on to have severe collapse and proximal migration and became this more  painful hip by a long shot.  At that point, we felt that left hip replacement was going to be appropriate course of action.  Once he was cleared medically, he was taken to the operating room for left total hip replacement.  DESCRIPTION OF PROCEDURE:  The patient was taken to the operating room after adequate anesthesia was obtained with a spinal anesthetic, placed supine on the operating table and onto the Hana bed.  All bony prominences were well padded.  Attention was  then turned to the  left hip where an incision was made for an anterior approach to the hip, subcutaneous tissue down to the level of the tensor fascia, which was clearly identified.  The fascia was opened, muscle finger dissected, and retractors put in  place above and below the neck.  The capsule was opened and tagged.  The hip had a provisional neck cut made, and the head was removed with the external rotation in place at 60 degrees in traction.  Retractors were put in place above and below the  acetabulum.  Labrectomy was performed, followed by sequential reaming up to a level of 55 mm.  A 56 mm Pinnacle Gription cup was hammered into place with 45 degrees of lateral opening, 30 degrees of anteversion, a hole eliminator placed, and a neutral  liner placed.  Attention turned to the stem side.  It was externally rotated, extended, and adducted.  It was then opened with a cookie cutter, followed by a chili pepper, followed by sequential broaching up to a size 12.  We then trialed it.  The 12  gave good fit and fill and was stable with an 8.5 ball trial.  Felt that if we could get a little length out of the neck, we might be able to get to a shorter neck ball or at least the same neck ball as I definitely wanted to lengthen him because on the  opposite side, he is in a varus position.  We then put the 13 rasp then and that gave good fit.  Put the 13  actual implant in and then trialed it with a 12 ball.  I was actually happy with that.  It was a little long, but I thought that is where I wanted  him to be just based on discussions we had had preoperatively and the fact that he was in varus on his opposite hip.  Once this was done, the wound was irrigated, suctioned dry, and then the capsule was closed with 1 Vicryl running.  The tensor fascia  was closed with 0 Vicryl, the skin with 0 and 2-0 Vicryl and 3-0 Monocryl subcuticular.  Benzoin and Steri-Strips were applied.  A sterile compressive dressing was applied, and the patient  was taken to recovery and noted to be in satisfactory condition.   Estimated blood loss for procedure was about 250 mL, but the final can be gotten from the anesthetic record.  LN/NUANCE  D:05/27/2019 T:05/27/2019 JOB:[id number]

## 2019-05-28 NOTE — TOC Initial Note (Signed)
Transition of Care [facility]) - Initial/Assessment Note    Patient Details  Name: David [name] MRN: [id number] Date of Birth: Nov 11, 1955  Transition of Care [facility] CM/SW Contact:    Joaquin Courts, RN Phone Number: 05/28/2019, 12:40 PM  Clinical Narrative:                 CM spoke with patient at bedside. Patient set up with [facility] for [place]. Patient reports he has rolling walker at home, declined 3-in-1.  Expected Discharge Plan: [place] Barriers to Discharge: No Barriers Identified   Patient Goals and CMS Choice Patient states their goals for this hospitalization and ongoing recovery are:: to go home CMS Medicare.gov Compare Post Acute Care list provided to:: Patient Choice offered to / list presented to : Patient  Expected Discharge Plan and Services Expected Discharge Plan: [place]   Discharge Planning Services: CM Consult Post Acute Care Choice: [place] arrangements for the past 2 months: Single Family Home Expected Discharge Date: 05/28/19               DME Arranged: N/A DME Agency: NA       HH Arranged: PT HH Agency: [facility] (formerly [employer]) Date [place]: 05/28/19 Time [place]: 1239 Representative spoke with at [place]: pre arranged by MD office  Prior Living Arrangements/Services Living arrangements for the past 2 months: [place] Lives with:: Spouse Patient language and need for interpreter reviewed:: Yes Do you feel safe going back to the place where you live?: Yes      Need for Family Participation in Patient Care: Yes (Comment) Care giver support system in place?: Yes (comment)   Criminal Activity/Legal Involvement Pertinent to Current Situation/Hospitalization: No - Comment as needed  Activities of Daily Living Home Assistive Devices/Equipment: Eyeglasses, Cane (specify quad or straight), Wheelchair, Environmental consultant (specify type) ADL  Screening (condition at time of admission) Patient's cognitive ability adequate to safely complete daily activities?: Yes Is the patient deaf or have difficulty hearing?: No Does the patient have difficulty seeing, even when wearing glasses/contacts?: No Does the patient have difficulty concentrating, remembering, or making decisions?: No Patient able to express need for assistance with ADLs?: Yes Does the patient have difficulty dressing or bathing?: No Independently performs ADLs?: Yes (appropriate for developmental age) Does the patient have difficulty walking or climbing stairs?: Yes Weakness of Legs: Both Weakness of Arms/Hands: None  Permission Sought/Granted                  Emotional Assessment Appearance:: Appears stated age Attitude/Demeanor/Rapport: Engaged Affect (typically observed): Accepting Orientation: : Oriented to Self, Oriented to Place, Oriented to  Time, Oriented to Situation   Psych Involvement: No (comment)  Admission diagnosis:  LEFT ANTERIOR TOTAL HIP ARTHROPLASTY Patient Active Problem List   Diagnosis Date Noted  . Primary osteoarthritis of left hip 05/27/2019  . GAD (generalized anxiety disorder) 08/03/2018  . ADD (attention deficit disorder) 08/03/2018  . Closed right hip fracture, initial encounter ([place]) 06/23/2018  . HTN (hypertension) 06/23/2018  . Hypertensive urgency 06/23/2018  . Depression with anxiety 06/23/2018  . Microcytic anemia 06/23/2018  . Alcohol abuse with alcohol-induced mood disorder ([place]) 03/16/2018   PCP:  [name], MD Pharmacy:   [facility] [place], [address] [place] [address] Phone:  [phone] Fax: [phone]  [place] [place] [address] Phone: [phone] Fax: [phone]  [id number] [address] [place] [address] Phone: [phone] Fax: [phone]     Social Determinants of Health (SDOH) Interventions    Readmission Risk Interventions No flowsheet data found.

## 2019-05-28 NOTE — Progress Notes (Signed)
    Home health agencies that serve [phone].        [place] Quality of Patient Care Rating Patient Survey Summary Rating  [facility] [phone] 3 out of 5 stars 4 out of [place] [phone] 3 out of 5 stars 5 out of [place] [phone]) [phone] 4  out of 5 stars 3 out of [place] [phone] 4 out of 5 stars 4 out of [place] [phone] 4 out of 5 stars 4 out of 5 stars  [facility] [place] [phone] 3  out of 5 stars 4 out of [place] [phone] 3 out of 5 stars 4 out of 5 stars  [facility] ([phone] 4 out of 5 stars Not Available12  [facility] [phone] 3  out of 5 stars 3 out of [place] [phone] 3  out of 5 stars 3 out of [place] [phone] 4  out of 5 stars 3 out of [place] number Footnote as displayed on [place]  1 This agency provides services under a federal waiver program to non-traditional, chronic long term population.  2 This agency provides services to a special needs population.  3 Not Available.  4 The number of patient episodes for this measure is too small to report.  5 This measure currently does not have data or provider has been certified/recertified for less than 6 months.  6 The national average for this measure is not provided because of state-to-state differences in data collection.  7 Medicare is not displaying rates for this measure for any home health agency, because of an issue with the data.  8 There were problems with the data and they are being corrected.  9 Zero, or very few, patients met the survey's rules for inclusion. The scores shown, if any, reflect a  very small number of surveys and may not accurately tell how an agency is doing.  10 Survey results are based on less than 12 months of data.  11 Fewer than 70 patients completed the survey. Use the scores shown, if any, with caution as the number of surveys may be too low to accurately tell how an agency is doing.  12 No survey results are available for this period.  13 Data suppressed by CMS for one or more quarters.

## 2019-05-28 NOTE — Progress Notes (Addendum)
Physical Therapy Treatment Patient Details Name: David [name] MRN: [id number] DOB: Aug 21, 1956 Today's Date: 05/28/2019    History of Present Illness 63 yo male s/p L DA-THA on 05/27/19. PMH includes GAD, ADD, HTN, depression/anxiety, alcohol abuse, bipolar disorder, PTSD, R hip pinning 06/24/18.    PT Comments    Progressing with mobility. Reviewed/practiced exercises, gait training, and stair training. All education completed. Okay to d/c from PT standpoint-made RN aware.   Follow Up Recommendations  Follow surgeon's recommendation for DC plan and follow-up therapies;Supervision for mobility/OOB     Equipment Recommendations  3in1 (PT)    Recommendations for Other Services       Precautions / Restrictions Precautions Precautions: Fall Restrictions Weight Bearing Restrictions: No Other Position/Activity Restrictions: WBAT    Mobility  Bed Mobility Overal bed mobility: Needs Assistance Bed Mobility: Supine to Sit     Supine to sit: Min assist     General bed mobility comments: Small amount of assist for L LE.  Transfers Overall transfer level: Needs assistance Equipment used: Rolling walker (2 wheeled) Transfers: Sit to/from Stand Sit to Stand: Min guard         General transfer comment: Close guard for safety. VCs safety, hand placement.  Ambulation/Gait Ambulation/Gait assistance: Min guard Gait Distance (Feet): 120 Feet Assistive device: Rolling walker (2 wheeled) Gait Pattern/deviations: Step-through pattern;Decreased stride length     General Gait Details: Close guard for safety. Cues for safety.   Stairs Stairs: Yes Stairs assistance: Min guard Stair Management: Step to pattern;Forwards;With walker Number of Stairs: 1(x2) General stair comments: Close guard for safety. VCs safety, sequence, technique.   Wheelchair Mobility    Modified Rankin (Stroke Patients Only)       Balance Overall balance assessment: Mild deficits observed, not  formally tested                                          Cognition Arousal/Alertness: Awake/alert Behavior During Therapy: WFL for tasks assessed/performed Overall Cognitive Status: Within Functional Limits for tasks assessed                                        Exercises Total Joint Exercises Ankle Circles/Pumps: AROM;Both;10 reps;Supine Quad Sets: AROM;Both;10 reps;Supine Heel Slides: AAROM;Left;10 reps;Supine Hip ABduction/ADduction: AAROM;Left;10 reps;Supine    General Comments        Pertinent Vitals/Pain Pain Assessment: 0-10 Pain Score: 6  Pain Location: L hip Pain Descriptors / Indicators: Sore Pain Intervention(s): Ice applied;Monitored during session    Home Living                      Prior Function            PT Goals (current goals can now be found in the care plan section) Progress towards PT goals: Progressing toward goals    Frequency    7X/week      PT Plan Current plan remains appropriate    Co-evaluation              AM-PAC PT "6 Clicks" Mobility   Outcome Measure  Help needed turning from your back to your side while in a flat bed without using bedrails?: A Little Help needed moving from lying on your back to sitting on the side of  a flat bed without using bedrails?: A Little Help needed moving to and from a bed to a chair (including a wheelchair)?: A Little Help needed standing up from a chair using your arms (e.g., wheelchair or bedside chair)?: A Little Help needed to walk in hospital room?: A Little Help needed climbing 3-5 steps with a railing? : A Little 6 Click Score: 18    End of Session Equipment Utilized During Treatment: Gait belt Activity Tolerance: Patient tolerated treatment well Patient left: in bed;with call bell/phone within reach   PT Visit Diagnosis: Other abnormalities of gait and mobility (R26.89);Difficulty in walking, not elsewhere classified (R26.2)      Time: [id number] PT Time Calculation (min) (ACUTE ONLY): 36 min  Charges:  $Gait Training: 8-22 mins $Therapeutic Exercise: 8-22 mins                        Weston Anna, PT Acute Rehabilitation Services Pager: [phone] Office: [phone]

## 2019-05-28 NOTE — Progress Notes (Signed)
    Patient doing well  S/P L THA per Dr Berenice Primas team. Moderate pain about incision site described as an ache that is well controlled with medications. He has been up and walking yesterday and slept well overnight. Tolerating PO's and +passing gas and NL B/B function. Denies SOB/Dizziness/Fatigue/Lightheadedness   Physical Exam: BP (!) 167/85 (BP Location: Right Arm)   Pulse 61   Temp (!) 97.4 F (36.3 C) (Oral)   Resp 16   Ht 5\' 8"  (1.727 m)   Wt 68 kg   SpO2 100%   BMI 22.79 kg/m    CBC Latest Ref Rng & Units 05/28/2019 05/26/2019 05/03/2019  WBC 4.0 - 10.5 K/uL 14.6(H) 9.9 9.5  Hemoglobin 13.0 - 17.0 g/dL 9.7(L) 13.4 11.3(L)  Hematocrit 39.0 - 52.0 % 31.3(L) 43.0 36.6(L)  Platelets 150 - 400 K/uL 277 435(H) 421(H)   BMP Latest Ref Rng & Units 05/27/2019 05/26/2019 05/03/2019  Glucose 70 - 99 mg/dL 189(H) 110(H) 105(H)  BUN 8 - 23 mg/dL 10 13 14   Creatinine 0.61 - 1.24 mg/dL 0.87 0.91 1.06  Sodium 135 - 145 mmol/L 137 139 137  Potassium 3.5 - 5.1 mmol/L 3.6 5.4(H) 3.9  Chloride 98 - 111 mmol/L 100 101 108  CO2 22 - 32 mmol/L 24 29 20(L)  Calcium 8.9 - 10.3 mg/dL 9.1 9.6 9.0     Dressing in place, CDI, pt resting comfortably in bed NVI  POD #1 s/p  L THA per Dr Berenice Primas team  - up with PT, encourage ambulation - Pain medications already sent to pharmacy electronically  - d/c home today with f/u in 2 weeks - ASA x 30 days for DVT prophylaxis

## 2019-05-30 ENCOUNTER — Other Ambulatory Visit: Payer: Self-pay

## 2019-05-30 ENCOUNTER — Encounter (HOSPITAL_COMMUNITY): Payer: Self-pay | Admitting: Orthopedic Surgery

## 2019-05-30 ENCOUNTER — Telehealth: Payer: Self-pay | Admitting: Psychiatry

## 2019-05-30 MED ORDER — ZOLPIDEM TARTRATE 10 MG PO TABS: 15.0000 mg | ORAL_TABLET | Freq: Every day | ORAL | 1 refills | Status: DC

## 2019-05-30 NOTE — Telephone Encounter (Signed)
Refill called into [facility] for 1 month and 1 additional refill

## 2019-05-30 NOTE — Telephone Encounter (Signed)
Pt called requested refill for Ambien at [facility] on file

## 2019-05-31 ENCOUNTER — Other Ambulatory Visit: Payer: Self-pay

## 2019-05-31 MED ORDER — ZOLPIDEM TARTRATE 10 MG PO TABS: 15.0000 mg | ORAL_TABLET | Freq: Every day | ORAL | 0 refills | Status: DC

## 2019-06-01 DIAGNOSIS — Z96642 Presence of left artificial hip joint: Secondary | ICD-10-CM | POA: Diagnosis not present

## 2019-06-01 DIAGNOSIS — F1020 Alcohol dependence, uncomplicated: Secondary | ICD-10-CM | POA: Diagnosis not present

## 2019-06-01 DIAGNOSIS — M47812 Spondylosis without myelopathy or radiculopathy, cervical region: Secondary | ICD-10-CM | POA: Diagnosis not present

## 2019-06-01 DIAGNOSIS — M1611 Unilateral primary osteoarthritis, right hip: Secondary | ICD-10-CM | POA: Diagnosis not present

## 2019-06-01 DIAGNOSIS — F909 Attention-deficit hyperactivity disorder, unspecified type: Secondary | ICD-10-CM | POA: Diagnosis not present

## 2019-06-01 DIAGNOSIS — D649 Anemia, unspecified: Secondary | ICD-10-CM | POA: Diagnosis not present

## 2019-06-01 DIAGNOSIS — F319 Bipolar disorder, unspecified: Secondary | ICD-10-CM | POA: Diagnosis not present

## 2019-06-01 DIAGNOSIS — Z87891 Personal history of nicotine dependence: Secondary | ICD-10-CM | POA: Diagnosis not present

## 2019-06-01 DIAGNOSIS — Z471 Aftercare following joint replacement surgery: Secondary | ICD-10-CM | POA: Diagnosis not present

## 2019-06-01 DIAGNOSIS — Z9181 History of falling: Secondary | ICD-10-CM | POA: Diagnosis not present

## 2019-06-01 DIAGNOSIS — Z87442 Personal history of urinary calculi: Secondary | ICD-10-CM | POA: Diagnosis not present

## 2019-06-01 DIAGNOSIS — F418 Other specified anxiety disorders: Secondary | ICD-10-CM | POA: Diagnosis not present

## 2019-06-01 DIAGNOSIS — I10 Essential (primary) hypertension: Secondary | ICD-10-CM | POA: Diagnosis not present

## 2019-06-01 DIAGNOSIS — Z85828 Personal history of other malignant neoplasm of skin: Secondary | ICD-10-CM | POA: Diagnosis not present

## 2019-06-06 DIAGNOSIS — M1612 Unilateral primary osteoarthritis, left hip: Secondary | ICD-10-CM | POA: Diagnosis not present

## 2019-06-13 NOTE — Discharge Summary (Signed)
Patient ID: David [name] MRN: [id number] DOB/AGE: [birth year] 63 y.o.  Admit date: 05/27/2019 Discharge date: 05/28/2019  Admission Diagnoses:  Principal Problem:   Primary osteoarthritis of left hip   Discharge Diagnoses:  Same  Past Medical History:  Diagnosis Date  . ADD (attention deficit disorder)   . Anemia    Microcytic  . Arthritis   . Bipolar disorder ([place])   . Depression   . Dyspnea    history of no current issues 05/26/2019  . GAD (generalized anxiety disorder)   . History of kidney stones    passed  . HTN (hypertension)   . PTSD (post-traumatic stress disorder)   . Skin cancer     Surgeries: Procedure(s): TOTAL HIP ARTHROPLASTY ANTERIOR APPROACH on 05/27/2019   Consultants: None  Discharged Condition: Improved  Hospital Course: David [name] is an 63 y.o. male who was admitted 05/27/2019 for operative treatment of Primary osteoarthritis of left hip. Patient has severe unremitting pain that affects sleep, daily activities, and work/hobbies. After pre-op clearance the patient was taken to the operating room on 05/27/2019 and underwent  Procedure(s): TOTAL HIP ARTHROPLASTY ANTERIOR APPROACH.    Patient was given perioperative antibiotics:  Anti-infectives (From admission, onward)   Start     Dose/Rate Route Frequency Ordered Stop   05/27/19 2100  vancomycin (VANCOCIN) IVPB 1000 mg/200 mL premix     1,000 mg 200 mL/hr over 60 Minutes Intravenous Every 12 hours 05/27/19 1314 05/27/19 2127   05/27/19 0730  vancomycin (VANCOCIN) IVPB 1000 mg/200 mL premix     1,000 mg 200 mL/hr over 60 Minutes Intravenous On call to O.R. 05/27/19 0729 05/27/19 0950   05/27/19 0730  ceFAZolin (ANCEF) IVPB 2g/100 mL premix     2 g 200 mL/hr over 30 Minutes Intravenous On call to O.R. 05/27/19 0728 05/27/19 1000       Patient was given sequential compression devices, early ambulation to prevent DVT.  Patient benefited maximally from hospital stay and there were no  complications.    Recent vital signs: BP (!) 150/81 (BP Location: Right Arm)   Pulse 66   Temp 97.6 F (36.4 C)   Resp 15   Ht 5\' 8"  (1.727 m)   Wt 68 kg   SpO2 100%   BMI 22.79 kg/m     Discharge Medications:   Allergies as of 05/28/2019      Reactions   Codeine Nausea Only      Medication List    STOP taking these medications   amoxicillin 500 MG capsule Commonly known as: AMOXIL   HYDROcodone-acetaminophen 5-325 MG tablet Commonly known as: NORCO/VICODIN   promethazine 25 MG tablet Commonly known as: PHENERGAN     TAKE these medications   amphetamine-dextroamphetamine 30 MG tablet Commonly known as: Adderall 1 in AM, 1 at noon, and 1/2 at 4PM What changed: Another medication with the same name was removed. Continue taking this medication, and follow the directions you see here.   aspirin EC 325 MG tablet Take 1 tablet (325 mg total) by mouth 2 (two) times daily after a meal. Take x 1 month post op to decrease risk of blood clots.   celecoxib 200 MG capsule Commonly known as: CELEBREX Take 1 capsule (200 mg total) by mouth 2 (two) times daily.   cetaphil lotion Apply 1 application topically as needed for dry skin (arms).   cloNIDine 0.3 MG tablet Commonly known as: CATAPRES TAKE 1 TABLET(0.3 MG) BY MOUTH TWICE DAILY What  changed: See the new instructions.   docusate sodium 100 MG capsule Commonly known as: Colace Take 1 capsule (100 mg total) by mouth 2 (two) times daily.   FLUoxetine 20 MG capsule Commonly known as: PROZAC Take 20 mg by mouth 2 (two) times daily.   hydrOXYzine 10 MG tablet Commonly known as: ATARAX/VISTARIL TAKE 1 TABLET(10 MG) BY MOUTH THREE TIMES DAILY AS NEEDED What changed: See the new instructions.   methocarbamol 750 MG tablet Commonly known as: ROBAXIN Take 750 mg by mouth every 8 (eight) hours as needed for muscle spasms. What changed: Another medication with the same name was added. Make sure you understand how and when  to take each.   methocarbamol 500 MG tablet Commonly known as: ROBAXIN Take 1 tablet (500 mg total) by mouth every 6 (six) hours as needed for muscle spasms. What changed: You were already taking a medication with the same name, and this prescription was added. Make sure you understand how and when to take each.   metoprolol succinate 50 MG 24 hr tablet Commonly known as: TOPROL-XL Take 50 mg by mouth daily with lunch.   ondansetron 4 MG tablet Commonly known as: Zofran Take 1 tablet (4 mg total) by mouth every 8 (eight) hours as needed for nausea or vomiting.   oxyCODONE-acetaminophen 5-325 MG tablet Commonly known as: PERCOCET/ROXICET Take 1-2 tablets by mouth every 6 (six) hours as needed for severe pain. What changed:   how much to take  when to take this  reasons to take this   pregabalin 150 MG capsule Commonly known as: LYRICA Take 1 capsule (150 mg total) by mouth 2 (two) times daily. What changed:   how much to take  when to take this   triamcinolone cream 0.1 % Commonly known as: KENALOG Apply 1 application topically 2 (two) times daily as needed (psoriasis).       Diagnostic Studies: Dg C-arm 1-60 Min-no Report  Result Date: 05/27/2019 CLINICAL DATA:  Left hip replacement. EXAM: OPERATIVE left HIP (WITH PELVIS IF PERFORMED) 2 VIEWS TECHNIQUE: Fluoroscopic spot image(s) were submitted for interpretation post-operatively. FLUOROSCOPY TIME:  28 seconds. COMPARISON:  Radiograph of June 23, 2018. FINDINGS: Two intraoperative fluoroscopic images of the left hip demonstrate the left acetabular and femoral components to be well situated. Expected postoperative changes are seen in the surrounding soft tissues. IMPRESSION: Status post left total hip arthroplasty. Electronically Signed   By: Marijo Conception M.D.   On: 05/27/2019 13:16   Dg Hip Operative Unilat W Or W/o Pelvis Left  Result Date: 05/27/2019 CLINICAL DATA:  Left hip replacement. EXAM: OPERATIVE left HIP  (WITH PELVIS IF PERFORMED) 2 VIEWS TECHNIQUE: Fluoroscopic spot image(s) were submitted for interpretation post-operatively. FLUOROSCOPY TIME:  28 seconds. COMPARISON:  Radiograph of June 23, 2018. FINDINGS: Two intraoperative fluoroscopic images of the left hip demonstrate the left acetabular and femoral components to be well situated. Expected postoperative changes are seen in the surrounding soft tissues. IMPRESSION: Status post left total hip arthroplasty. Electronically Signed   By: Marijo Conception M.D.   On: 05/27/2019 13:16    Disposition: Discharge disposition: 01-Home or Self Care       Discharge Instructions    Discharge patient   Complete by: As directed    Home later today after PT   Discharge disposition: 01-Home or Self Care   Discharge patient date: 05/28/2019     POD #1 s/p  L THA per Dr Berenice Primas team  - up with  PT, encourage ambulation - Pain medications already sent to pharmacy electronically  - d/c home today with f/u in 2 weeks - ASA x 30 days for DVT prophylaxis   Signed: Lennie Muckle Khyli Swaim 06/13/2019, 1:59 PM

## 2019-06-17 ENCOUNTER — Telehealth: Payer: Self-pay | Admitting: Psychiatry

## 2019-06-17 ENCOUNTER — Ambulatory Visit (INDEPENDENT_AMBULATORY_CARE_PROVIDER_SITE_OTHER): Payer: PPO | Admitting: Podiatry

## 2019-06-17 ENCOUNTER — Other Ambulatory Visit: Payer: Self-pay | Admitting: Psychiatry

## 2019-06-17 ENCOUNTER — Encounter: Payer: Self-pay | Admitting: Podiatry

## 2019-06-17 ENCOUNTER — Other Ambulatory Visit: Payer: Self-pay

## 2019-06-17 VITALS — BP 142/95 | HR 86 | Resp 16

## 2019-06-17 DIAGNOSIS — M79675 Pain in left toe(s): Secondary | ICD-10-CM | POA: Diagnosis not present

## 2019-06-17 DIAGNOSIS — F902 Attention-deficit hyperactivity disorder, combined type: Secondary | ICD-10-CM

## 2019-06-17 DIAGNOSIS — M79674 Pain in right toe(s): Secondary | ICD-10-CM

## 2019-06-17 DIAGNOSIS — B351 Tinea unguium: Secondary | ICD-10-CM

## 2019-06-17 MED ORDER — AMPHETAMINE-DEXTROAMPHETAMINE 30 MG PO TABS: ORAL_TABLET | ORAL | 0 refills | Status: DC

## 2019-06-17 NOTE — Telephone Encounter (Signed)
Done

## 2019-06-17 NOTE — Progress Notes (Signed)
   Subjective:    Patient ID: David [name], male    DOB: 12/23/1955, 63 y.o.   MRN: [id number]  HPI    Review of Systems  All other systems reviewed and are negative.      Objective:   Physical Exam        Assessment & Plan:

## 2019-06-17 NOTE — Telephone Encounter (Signed)
David [name] came by to request refill of his Adderall.  Next appt 06/29/19.  Please send to [facility] on [address]

## 2019-06-21 DIAGNOSIS — M1612 Unilateral primary osteoarthritis, left hip: Secondary | ICD-10-CM | POA: Diagnosis not present

## 2019-06-22 NOTE — Progress Notes (Signed)
Subjective:   Patient ID: David [name], male   DOB: 63 y.o.   MRN: [id number]   HPI Patient presents with severely thickened nails 1-5 both feet that he cannot take care of and states that he has tried to trim them and other modalities without relief and he needs it done as he cannot control the pain he is experiencing.  Patient does not smoke likes to be active   Review of Systems  All other systems reviewed and are negative.       Objective:  Physical Exam Vitals signs and nursing note reviewed.  Constitutional:      Appearance: He is well-developed.  Pulmonary:     Effort: Pulmonary effort is normal.  Musculoskeletal: Normal range of motion.  Skin:    General: Skin is warm.  Neurological:     Mental Status: He is alert.     Neurovascular status was found to be intact with patient noted to have good range of motion of the subtalar midtarsal joint bilateral.  Patient is found to have severely thickened dystrophic nailbeds 1-5 both feet that can become tender and he is tried to work on them himself     Assessment:  Onychomycotic nail infection with mycotic component and pain 1-5 both feet     Plan:  H&P reviewed condition and recommended aggressive trimming technique with possibility for removal if do not get better and I do not recommend oral medications or other treatment which we reviewed today

## 2019-06-28 ENCOUNTER — Other Ambulatory Visit: Payer: Self-pay

## 2019-06-28 MED ORDER — FLUOXETINE HCL 20 MG PO CAPS: 40.0000 mg | ORAL_CAPSULE | Freq: Every day | ORAL | 0 refills | Status: DC

## 2019-06-29 ENCOUNTER — Ambulatory Visit: Payer: PPO | Admitting: Psychiatry

## 2019-07-15 ENCOUNTER — Other Ambulatory Visit: Payer: Self-pay

## 2019-07-15 ENCOUNTER — Telehealth: Payer: Self-pay | Admitting: Psychiatry

## 2019-07-15 DIAGNOSIS — F902 Attention-deficit hyperactivity disorder, combined type: Secondary | ICD-10-CM

## 2019-07-15 MED ORDER — AMPHETAMINE-DEXTROAMPHETAMINE 30 MG PO TABS: ORAL_TABLET | ORAL | 0 refills | Status: DC

## 2019-07-15 NOTE — Telephone Encounter (Signed)
Pt requesting a refill on his Adderall. Please fill at the [facility] on [address] appt.scheduled for 11/10.

## 2019-07-15 NOTE — Telephone Encounter (Signed)
Last refill 06/17/2019, pended for approval New pharmacy added [facility] [address]

## 2019-07-16 ENCOUNTER — Other Ambulatory Visit: Payer: Self-pay | Admitting: Psychiatry

## 2019-07-26 ENCOUNTER — Ambulatory Visit (INDEPENDENT_AMBULATORY_CARE_PROVIDER_SITE_OTHER): Payer: PPO | Admitting: Psychiatry

## 2019-07-26 ENCOUNTER — Other Ambulatory Visit: Payer: Self-pay

## 2019-07-26 ENCOUNTER — Encounter: Payer: Self-pay | Admitting: Psychiatry

## 2019-07-26 VITALS — BP 160/116 | HR 110

## 2019-07-26 DIAGNOSIS — F17213 Nicotine dependence, cigarettes, with withdrawal: Secondary | ICD-10-CM

## 2019-07-26 DIAGNOSIS — F5105 Insomnia due to other mental disorder: Secondary | ICD-10-CM

## 2019-07-26 DIAGNOSIS — F902 Attention-deficit hyperactivity disorder, combined type: Secondary | ICD-10-CM | POA: Diagnosis not present

## 2019-07-26 DIAGNOSIS — F1011 Alcohol abuse, in remission: Secondary | ICD-10-CM

## 2019-07-26 DIAGNOSIS — I10 Essential (primary) hypertension: Secondary | ICD-10-CM | POA: Diagnosis not present

## 2019-07-26 DIAGNOSIS — F411 Generalized anxiety disorder: Secondary | ICD-10-CM
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
## 2019-07-27 ENCOUNTER — Other Ambulatory Visit: Payer: Self-pay | Admitting: Psychiatry

## 2019-07-27 NOTE — Telephone Encounter (Signed)
Apt yesterday, any changes? No note

## 2019-08-02 DIAGNOSIS — M1612 Unilateral primary osteoarthritis, left hip: Secondary | ICD-10-CM | POA: Diagnosis not present

## 2019-08-02 DIAGNOSIS — Z9889 Other specified postprocedural states: Secondary | ICD-10-CM | POA: Diagnosis not present

## 2019-08-09 ENCOUNTER — Telehealth: Payer: Self-pay | Admitting: Psychiatry

## 2019-08-09 NOTE — Telephone Encounter (Signed)
David [name] called to request refill of his Adderall.  Appt 10/25/19.  Send to [facility] on [address].

## 2019-08-10 ENCOUNTER — Other Ambulatory Visit: Payer: Self-pay | Admitting: Psychiatry

## 2019-08-10 ENCOUNTER — Other Ambulatory Visit: Payer: Self-pay

## 2019-08-10 DIAGNOSIS — F902 Attention-deficit hyperactivity disorder, combined type: Secondary | ICD-10-CM

## 2019-08-10 MED ORDER — AMPHETAMINE-DEXTROAMPHETAMINE 30 MG PO TABS: ORAL_TABLET | ORAL | 0 refills | Status: DC

## 2019-08-10 NOTE — Telephone Encounter (Signed)
Last refill 07/15/2019 Will pend for appropriate date to be filled.

## 2019-08-13 ENCOUNTER — Emergency Department (HOSPITAL_COMMUNITY): Admission: EM | Admit: 2019-08-13 | Discharge: 2019-08-13 | Discharge status: Against Medical Advice | Payer: PPO

## 2019-08-13 ENCOUNTER — Other Ambulatory Visit: Payer: Self-pay

## 2019-08-14 DIAGNOSIS — F17210 Nicotine dependence, cigarettes, uncomplicated: Secondary | ICD-10-CM | POA: Diagnosis not present

## 2019-08-14 DIAGNOSIS — I10 Essential (primary) hypertension: Secondary | ICD-10-CM | POA: Diagnosis not present

## 2019-08-14 DIAGNOSIS — L239 Allergic contact dermatitis, unspecified cause: Secondary | ICD-10-CM | POA: Diagnosis not present

## 2019-08-15 MED ORDER — CLONIDINE HCL 0.3 MG PO TABS: 0.3000 mg | ORAL_TABLET | Freq: Two times a day (BID) | ORAL | 0 refills | Status: DC

## 2019-08-15 MED ORDER — METOPROLOL SUCCINATE ER 100 MG PO TB24: 100.0000 mg | ORAL_TABLET | Freq: Every day | ORAL | 1 refills | Status: DC

## 2019-08-26 ENCOUNTER — Other Ambulatory Visit: Payer: Self-pay

## 2019-08-26 MED ORDER — PREGABALIN 150 MG PO CAPS: ORAL_CAPSULE | ORAL | 5 refills | Status: DC

## 2019-08-30 DIAGNOSIS — I10 Essential (primary) hypertension: Secondary | ICD-10-CM | POA: Diagnosis not present

## 2019-08-30 DIAGNOSIS — L0390 Cellulitis, unspecified: Secondary | ICD-10-CM | POA: Diagnosis not present

## 2019-08-31 ENCOUNTER — Other Ambulatory Visit: Payer: Self-pay

## 2019-08-31 ENCOUNTER — Encounter (HOSPITAL_COMMUNITY): Payer: Self-pay | Admitting: Emergency Medicine

## 2019-08-31 ENCOUNTER — Emergency Department (HOSPITAL_COMMUNITY)
Admission: EM | Admit: 2019-08-31 | Discharge: 2019-08-31 | Discharge status: Against Medical Advice | Payer: PPO | Attending: Emergency Medicine | Admitting: Emergency Medicine

## 2019-08-31 DIAGNOSIS — I10 Essential (primary) hypertension: Secondary | ICD-10-CM | POA: Diagnosis present

## 2019-08-31 DIAGNOSIS — Z5321 Procedure and treatment not carried out due to patient leaving prior to being seen by health care provider: Secondary | ICD-10-CM | POA: Insufficient documentation

## 2019-08-31 NOTE — ED Triage Notes (Signed)
Pt reports he went to urgent care for "a spider bite" however it was suggested he go to the hospital for his high blood pressure.  Wife made him come to the ED  After he began to feel "worse."  BP is 155/118 in triage.  Pt also complains of right shoulder pain X 1 day.

## 2019-08-31 NOTE — ED Notes (Signed)
Called pt for vitals, no answer x3.

## 2019-09-12 ENCOUNTER — Telehealth: Payer: Self-pay | Admitting: Psychiatry

## 2019-09-12 NOTE — Telephone Encounter (Signed)
Pt requesting a refill on his Adderall. Fill at the [facility] on [address]. Appt scheduled for 10/25/2019.

## 2019-09-13 ENCOUNTER — Other Ambulatory Visit: Payer: Self-pay

## 2019-09-13 ENCOUNTER — Ambulatory Visit (INDEPENDENT_AMBULATORY_CARE_PROVIDER_SITE_OTHER): Payer: PPO | Admitting: Podiatry

## 2019-09-13 ENCOUNTER — Encounter: Payer: Self-pay | Admitting: Podiatry

## 2019-09-13 DIAGNOSIS — C44629 Squamous cell carcinoma of skin of left upper limb, including shoulder: Secondary | ICD-10-CM | POA: Diagnosis not present

## 2019-09-13 DIAGNOSIS — C44712 Basal cell carcinoma of skin of right lower limb, including hip: Secondary | ICD-10-CM | POA: Diagnosis not present

## 2019-09-13 DIAGNOSIS — M79674 Pain in right toe(s): Secondary | ICD-10-CM | POA: Diagnosis not present

## 2019-09-13 DIAGNOSIS — B351 Tinea unguium: Secondary | ICD-10-CM

## 2019-09-13 DIAGNOSIS — M79675 Pain in left toe(s): Secondary | ICD-10-CM | POA: Diagnosis not present

## 2019-09-13 DIAGNOSIS — D485 Neoplasm of uncertain behavior of skin: Secondary | ICD-10-CM | POA: Diagnosis not present

## 2019-09-13 DIAGNOSIS — L578 Other skin changes due to chronic exposure to nonionizing radiation: Secondary | ICD-10-CM | POA: Diagnosis not present

## 2019-09-13 MED ORDER — AMPHETAMINE-DEXTROAMPHETAMINE 30 MG PO TABS: ORAL_TABLET | ORAL | 0 refills | Status: DC

## 2019-09-13 NOTE — Telephone Encounter (Signed)
Noted thank you. Will resend

## 2019-09-13 NOTE — Telephone Encounter (Signed)
David [name] called again about the refill of his Adderall.  The chart looks like there is already one for Dec and Jan already sent but it also shows the transmission failed.  He said the pharmacy said they don't have any.  Please resend to pharmacy.  [facility] on [address].

## 2019-09-13 NOTE — Progress Notes (Signed)
Complaint:  Visit Type: Patient returns to my office for continued preventative foot care services. Complaint: Patient states" my nails have grown long and thick and become painful to walk and wear shoes"  The patient presents for preventative foot care services.  Podiatric Exam: Vascular: dorsalis pedis and posterior tibial pulses are palpable bilateral. Capillary return is immediate. Temperature gradient is WNL. Skin turgor WNL  Sensorium: Normal Semmes Weinstein monofilament test. Normal tactile sensation bilaterally. Nail Exam: Pt has thick disfigured discolored nails with subungual debris noted bilateral entire nail hallux through fifth toenails Ulcer Exam: There is no evidence of ulcer or pre-ulcerative changes or infection. Orthopedic Exam: Muscle tone and strength are WNL. No limitations in general ROM. No crepitus or effusions noted. Foot type and digits show no abnormalities. Bony prominences are unremarkable. Skin: No Porokeratosis. No infection or ulcers  Diagnosis:  Onychomycosis, , Pain in right toe, pain in left toes  Treatment & Plan Procedures and Treatment: Consent by patient was obtained for treatment procedures.   Debridement of mycotic and hypertrophic toenails, 1 through 5 bilateral and clearing of subungual debris. No ulceration, no infection noted. Discussed topical fungal application.  Return Visit-Office Procedure: Patient instructed to return to the office for a follow up visit 10 weeks  for continued evaluation and treatment.    Gardiner Barefoot DPM

## 2019-09-19 ENCOUNTER — Other Ambulatory Visit: Payer: Self-pay | Admitting: Psychiatry

## 2019-09-22 ENCOUNTER — Telehealth: Payer: Self-pay | Admitting: Psychiatry

## 2019-09-22 ENCOUNTER — Other Ambulatory Visit: Payer: Self-pay

## 2019-09-22 MED ORDER — ZOLPIDEM TARTRATE 10 MG PO TABS: 15.0000 mg | ORAL_TABLET | Freq: Every day | ORAL | 2 refills | Status: DC

## 2019-09-22 NOTE — Telephone Encounter (Signed)
Patient called and said that he needs a refill on his ambien 10mg  to be sent to the [facility] on [address]

## 2019-09-22 NOTE — Telephone Encounter (Signed)
Last refill 08/25/2019 Pended for approval

## 2019-10-13 ENCOUNTER — Emergency Department (HOSPITAL_COMMUNITY)
Admission: EM | Admit: 2019-10-13 | Discharge: 2019-10-14 | Discharge status: Against Medical Advice | Payer: PPO | Attending: Emergency Medicine | Admitting: Emergency Medicine

## 2019-10-13 ENCOUNTER — Encounter (HOSPITAL_COMMUNITY): Payer: Self-pay | Admitting: Emergency Medicine

## 2019-10-13 ENCOUNTER — Other Ambulatory Visit: Payer: Self-pay

## 2019-10-13 DIAGNOSIS — R5383 Other fatigue: Secondary | ICD-10-CM | POA: Diagnosis present

## 2019-10-13 DIAGNOSIS — R42 Dizziness and giddiness: Secondary | ICD-10-CM | POA: Diagnosis not present

## 2019-10-13 DIAGNOSIS — Z5320 Procedure and treatment not carried out because of patient's decision for unspecified reasons: Secondary | ICD-10-CM | POA: Diagnosis not present

## 2019-10-13 DIAGNOSIS — R112 Nausea with vomiting, unspecified: Secondary | ICD-10-CM | POA: Diagnosis not present

## 2019-10-13 DIAGNOSIS — R197 Diarrhea, unspecified: Secondary | ICD-10-CM | POA: Diagnosis not present

## 2019-10-13 DIAGNOSIS — R519 Headache, unspecified: Secondary | ICD-10-CM | POA: Insufficient documentation

## 2019-10-13 DIAGNOSIS — Z85828 Personal history of other malignant neoplasm of skin: Secondary | ICD-10-CM | POA: Diagnosis not present

## 2019-10-13 DIAGNOSIS — Z20822 Contact with and (suspected) exposure to covid-19: Secondary | ICD-10-CM | POA: Diagnosis not present

## 2019-10-13 DIAGNOSIS — Z87891 Personal history of nicotine dependence: Secondary | ICD-10-CM | POA: Insufficient documentation

## 2019-10-13 DIAGNOSIS — I10 Essential (primary) hypertension: Secondary | ICD-10-CM | POA: Diagnosis not present

## 2019-10-13 DIAGNOSIS — R29810 Facial weakness: Secondary | ICD-10-CM | POA: Diagnosis not present

## 2019-10-13 DIAGNOSIS — R05 Cough: Secondary | ICD-10-CM | POA: Diagnosis not present

## 2019-10-13 DIAGNOSIS — I959 Hypotension, unspecified: Secondary | ICD-10-CM | POA: Diagnosis not present

## 2019-10-13 DIAGNOSIS — R0902 Hypoxemia: Secondary | ICD-10-CM | POA: Diagnosis not present

## 2019-10-13 DIAGNOSIS — R0602 Shortness of breath: Secondary | ICD-10-CM | POA: Diagnosis not present

## 2019-10-13 MED ORDER — SODIUM CHLORIDE 0.9 % IV BOLUS
1000.0000 mL | Freq: Once | INTRAVENOUS | Status: AC
  Administered 2019-10-14: 01:00:00 1000 mL via INTRAVENOUS
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
## 2019-10-13 NOTE — ED Provider Notes (Signed)
[facility] EMERGENCY DEPARTMENT Provider Note   CSN: [id number] Arrival date & time: 10/13/19  2304     History Chief Complaint  Patient presents with  . Fatigue  . Nausea    David [name] is a 64 y.o. male with a hx of bipolar disorder, anxiety, PTSD, HTN, Kidney stones presents to the Emergency Department complaining of gradual, persistent, progressively worsening "flu" onset 3-4 days ago.  Pt reports body aches, nausea, vomiting, diarrhea, abd pain, headache, minimal cough, shortness of breath. Pt reports he has been hot and cold but unknown if he has a fever. Pt denies known sick contacts.  He reports taking Klonopin which did not help his symptoms.  Pt denies neck pain, chest pain, syncope, dysuria.  Pt does reports EtOH and marijuana today.  He reports he has been fighting with his wife today as well.     The history is provided by the patient and medical records. No language interpreter was used.       Past Medical History:  Diagnosis Date  . ADD (attention deficit disorder)   . Anemia    Microcytic  . Arthritis   . Bipolar disorder ([place])   . Depression   . Dyspnea    history of no current issues 05/26/2019  . GAD (generalized anxiety disorder)   . History of kidney stones    passed  . HTN (hypertension)   . PTSD (post-traumatic stress disorder)   . Skin cancer     Patient Active Problem List   Diagnosis Date Noted  . Primary osteoarthritis of left hip 05/27/2019  . GAD (generalized anxiety disorder) 08/03/2018  . ADD (attention deficit disorder) 08/03/2018  . Closed right hip fracture, initial encounter ([place]) 06/23/2018  . HTN (hypertension) 06/23/2018  . Hypertensive urgency 06/23/2018  . Depression with anxiety 06/23/2018  . Microcytic anemia 06/23/2018  . Alcohol abuse with alcohol-induced mood disorder ([place]) 03/16/2018    Past Surgical History:  Procedure Laterality Date  . BACK SURGERY     Fusion - lumbar  . HIP  PINNING,CANNULATED Right 06/24/2018   Procedure: RIGHT CANNULATED HIP PINNING;  Surgeon: Erle Crocker, MD;  Location: [facility];  Service: Orthopedics;  Laterality: Right;  . LITHOTRIPSY    . MULTIPLE EXTRACTIONS WITH ALVEOLOPLASTY Bilateral 05/03/2019   Procedure: MULTIPLE EXTRACTION TEETH NUMBER TWO, THREE, FIVE, SIX, SEVEN, EIGHT, NINE, TEN, ELEVEN, FIFTEEN, SIXTEEN, SEVENTEEN, NINETEEN, TWENTY, TWENTY-ONE, TWENTY-TWO, TWENTY-THREE, TWENTY-FOUR, TWENTY-FIVE, TWENTY-SIX, TWENTY-SEVEN, TWENTY-EIGHT, TWENTY-NINE, THIRTY-TWO WITH ALVEOLOPLASTY;  Surgeon: Diona Browner, DDS;  Location: [place];  Service: Oral Surgery;  Laterality: Bilateral;  . NOSE SURGERY     x3  . TOTAL HIP ARTHROPLASTY Left 05/27/2019   Procedure: TOTAL HIP ARTHROPLASTY ANTERIOR APPROACH;  Surgeon: Dorna Leitz, MD;  Location: [facility];  Service: Orthopedics;  Laterality: Left;       Family History  Problem Relation Age of Onset  . Arthritis Other   . Heart disease Other   . Cancer Other   . Hypertension Other     Social History   Tobacco Use  . Smoking status: Former Smoker    Packs/day: 1.00    Years: 40.00    Pack years: 40.00    Quit date: 04/25/2019    Years since quitting: 0.4  . Smokeless tobacco: Never Used  Substance Use Topics  . Alcohol use: Yes    Alcohol/week: 12.0 standard drinks    Types: 12 Cans of beer per week  . Drug use: Yes  Types: Marijuana    Home Medications Prior to Admission medications   Medication Sig Start Date End Date Taking? Authorizing Provider  amphetamine-dextroamphetamine (ADDERALL) 30 MG tablet 1 in AM, 1 at noon, and 1/2 at [facility] 08/10/19   Cottle, Billey Co., MD  amphetamine-dextroamphetamine (ADDERALL) 30 MG tablet Take 1 tablet by mouth in the morning, 1 tablet at noon, and 1/2 tablet at [facility] 09/13/19   Cottle, Billey Co., MD  amphetamine-dextroamphetamine (ADDERALL) 30 MG tablet Take 1 tablet by mouth in the morning, 1 tablet at noon, and 1/2 tablet at [facility] 10/11/19    Cottle, Billey Co., MD  aspirin EC 325 MG tablet Take 1 tablet (325 mg total) by mouth 2 (two) times daily after a meal. Take x 1 month post op to decrease risk of blood clots. 05/27/19   Gary Fleet, PA-C  celecoxib (CELEBREX) 200 MG capsule Take 1 capsule (200 mg total) by mouth 2 (two) times daily. 05/28/19   McKenzie, Lennie Muckle, PA-C  cetaphil (CETAPHIL) lotion Apply 1 application topically as needed for dry skin (arms).    [provider]  cloNIDine (CATAPRES) 0.3 MG tablet TAKE 1 TABLET(0.3 MG) BY MOUTH TWICE DAILY 09/19/19   Cottle, Billey Co., MD  docusate sodium (COLACE) 100 MG capsule Take 1 capsule (100 mg total) by mouth 2 (two) times daily. 05/27/19   Gary Fleet, PA-C  FLUoxetine (PROZAC) 20 MG capsule TAKE 2 CAPSULES(40 MG) BY MOUTH DAILY 07/28/19   Cottle, Billey Co., MD  HYDROcodone-acetaminophen (NORCO/VICODIN) 5-325 MG tablet  06/06/19   [provider]  hydrOXYzine (ATARAX/VISTARIL) 10 MG tablet TAKE 1 TABLET(10 MG) BY MOUTH THREE TIMES DAILY AS NEEDED 09/19/19   Cottle, Billey Co., MD  methocarbamol (ROBAXIN) 500 MG tablet Take 1 tablet (500 mg total) by mouth every 6 (six) hours as needed for muscle spasms. 05/28/19   McKenzie, Lennie Muckle, PA-C  methocarbamol (ROBAXIN) 750 MG tablet Take 750 mg by mouth every 8 (eight) hours as needed for muscle spasms. 02/18/19   [provider]  metoprolol succinate (TOPROL-XL) 100 MG 24 hr tablet Take 1 tablet (100 mg total) by mouth daily with lunch. 08/15/19   Cottle, Billey Co., MD  ondansetron (ZOFRAN) 4 MG tablet Take 1 tablet (4 mg total) by mouth every 8 (eight) hours as needed for nausea or vomiting. 05/03/19   Diona Browner, DDS  pregabalin (LYRICA) 150 MG capsule TAKE 1 CAPSULE(150 MG) BY MOUTH TWICE DAILY 08/26/19   Cottle, Billey Co., MD  triamcinolone cream (KENALOG) 0.1 % Apply 1 application topically 2 (two) times daily as needed (psoriasis).    [provider]  zolpidem (AMBIEN) 10 MG tablet  Take 1.5 tablets (15 mg total) by mouth at bedtime. 09/22/19   Cottle, Billey Co., MD    Allergies    Codeine  Review of Systems   Review of Systems  Constitutional: Positive for fatigue. Negative for appetite change, diaphoresis, fever and unexpected weight change.  HENT: Negative for mouth sores.   Eyes: Negative for visual disturbance.  Respiratory: Positive for cough and shortness of breath. Negative for chest tightness and wheezing.   Cardiovascular: Negative for chest pain.  Gastrointestinal: Positive for abdominal pain, diarrhea, nausea and vomiting. Negative for constipation.  Endocrine: Negative for polydipsia, polyphagia and polyuria.  Genitourinary: Negative for dysuria, frequency, hematuria and urgency.  Musculoskeletal: Positive for myalgias. Negative for back pain and neck stiffness.  Skin: Negative for rash.  Allergic/Immunologic: Negative  for immunocompromised state.  Neurological: Positive for headaches. Negative for syncope and light-headedness.  Hematological: Does not bruise/bleed easily.  Psychiatric/Behavioral: Negative for sleep disturbance. The patient is not nervous/anxious.     Physical Exam Updated Vital Signs BP 99/61 (BP Location: Right Arm)   Pulse (!) 49   Temp 98.3 F (36.8 C) (Oral)   Resp 18   Ht 5\' 8"  (1.727 m)   Wt 72.6 kg   SpO2 99%   BMI 24.34 kg/m   Physical Exam Vitals and nursing note reviewed.  Constitutional:      General: He is not in acute distress.    Appearance: He is not diaphoretic.  HENT:     Head: Normocephalic.  Eyes:     General: No scleral icterus.    Conjunctiva/sclera: Conjunctivae normal.  Cardiovascular:     Rate and Rhythm: Normal rate and regular rhythm.     Pulses: Normal pulses.          Radial pulses are 2+ on the right side and 2+ on the left side.  Pulmonary:     Effort: No tachypnea, accessory muscle usage, prolonged expiration, respiratory distress or retractions.     Breath sounds: No stridor.      Comments: Equal chest rise. No increased work of breathing. Abdominal:     General: There is no distension.     Palpations: Abdomen is soft.     Tenderness: There is no abdominal tenderness. There is no guarding or rebound.  Musculoskeletal:     Cervical back: Normal range of motion.     Comments: Moves all extremities equally and without difficulty.  Skin:    General: Skin is warm and dry.     Capillary Refill: Capillary refill takes less than 2 seconds.  Neurological:     Mental Status: He is alert.     GCS: GCS eye subscore is 4. GCS verbal subscore is 5. GCS motor subscore is 6.     Comments: Speech is clear and goal oriented.  Psychiatric:        Mood and Affect: Affect is labile.     ED Results / Procedures / Treatments   Labs (all labs ordered are listed, but only abnormal results are displayed) Labs Reviewed  CBC WITH DIFFERENTIAL/PLATELET - Abnormal; Notable for the following components:      Result Value   WBC 13.1 (*)    Hemoglobin 10.7 (*)    HCT 35.3 (*)    MCV 78.8 (*)    MCH 23.9 (*)    RDW 18.0 (*)    Neutro Abs 7.8 (*)    Monocytes Absolute 1.2 (*)    All other components within normal limits  COMPREHENSIVE METABOLIC PANEL - Abnormal; Notable for the following components:   Sodium 131 (*)    All other components within normal limits  ACETAMINOPHEN LEVEL - Abnormal; Notable for the following components:   Acetaminophen (Tylenol), Serum <10 (*)    All other components within normal limits  SALICYLATE LEVEL - Abnormal; Notable for the following components:   Salicylate Lvl [id number] (*)    All other components within normal limits  LIPASE, BLOOD  ETHANOL  URINALYSIS, ROUTINE W REFLEX MICROSCOPIC  RAPID URINE DRUG SCREEN, HOSP PERFORMED  POC SARS CORONAVIRUS 2 AG -  ED  TROPONIN I (HIGH SENSITIVITY)    EKG EKG Interpretation  Date/Time:  Thursday October 13 2019 23:15:29 EST Ventricular Rate:  52 PR Interval:    QRS Duration: 94 QT Interval:  462  QTC Calculation: 430 R Axis:   78 Text Interpretation: Sinus rhythm No significant change since last tracing Confirmed by Ripley Fraise [phone]) on 10/13/2019 11:38:10 PM   Procedures Procedures (including critical care time)  Medications Ordered in ED Medications  sodium chloride 0.9 % bolus 1,000 mL (0 mLs Intravenous Stopped 10/14/19 0203)  LORazepam (ATIVAN) injection 0.5 mg (0.5 mg Intravenous Given 10/14/19 0100)  acetaminophen (TYLENOL) tablet 1,000 mg (1,000 mg Oral Given 10/14/19 0130)    ED Course  I have reviewed the triage vital signs and the nursing notes.  Pertinent labs & imaging results that were available during my care of the patient were reviewed by me and considered in my medical decision making (see chart for details).  Clinical Course as of Oct 13 402  Fri Oct 14, 2019  0402 Baseline  Hemoglobin(!): 10.7 [HM]    Clinical Course User Index [HM] Mckenzie Bove, Gwenlyn Perking   MDM Rules/Calculators/A&P                       David [name] was evaluated in Emergency Department on 10/14/2019 for the symptoms described in the history of present illness. He was evaluated in the context of the global COVID-19 pandemic, which necessitated consideration that the patient might be at risk for infection with the SARS-CoV-2 virus that causes COVID-19. Institutional protocols and algorithms that pertain to the evaluation of patients at risk for COVID-19 are in a state of rapid change based on information released by regulatory bodies including the CDC and federal and state organizations. These policies and algorithms were followed during the patient's care in the ED.  Patient presents with Covid-like illness.  He is afebrile without tachycardia, hypotension or hypoxia.  No respiratory distress.  Labs with mild leukocytosis and mild anemia.  Patient's anemia appears to be baseline.  Other labs are reassuring.  Covid antigen negative.  Troponin within normal limits.  Patient  reporting that he just wants to sleep.  He is agitated.  Ativan 0.5 mg given with minimal improvement.  He reports he is achy and does not feel well.  He is alert, oriented and interactive.  He ambulates with steady gait and is able to dress himself.  EKG without ischemia.  Suspect COVID-19 infection.  Patient decided to elope from the emergency department before providing a urine sample, obtaining a repeat Covid test, obtaining a chest x-ray or having any further discussion with me.  He is alert and oriented with normal vital signs and reassuring lab work-up.  Patient may return anytime for further evaluation.  The patient was discussed with and evaluated by Dr. Christy Gentles prior to his departure from the ED.    Final Clinical Impression(s) / ED Diagnoses Final diagnoses:  Suspected COVID-19 virus infection  Nausea vomiting and diarrhea    Rx / DC Orders ED Discharge Orders    None       Zsazsa Bahena, Gwenlyn Perking 10/14/19 0407    Ripley Fraise, MD 10/15/19 [phone]

## 2019-10-13 NOTE — ED Triage Notes (Signed)
Pt arrived from home via [facility] c/o 3 days of N/V with diarrhea. Also Weakness which started today. Pt alert and oriented to person, place, time and event currently. Pt given 4 mg zofran enroute without relief. Pt admited to drinking 1 beer (white claw) today and sveral hits of marijuana.

## 2019-10-14 DIAGNOSIS — R112 Nausea with vomiting, unspecified: Secondary | ICD-10-CM | POA: Diagnosis not present

## 2019-10-14 LAB — COMPREHENSIVE METABOLIC PANEL
ALT: 19 U/L (ref 0–44)
AST: 22 U/L (ref 15–41)
Albumin: 3.9 g/dL (ref 3.5–5.0)
Alkaline Phosphatase: 94 U/L (ref 38–126)
Anion gap: 10 (ref 5–15)
BUN: 18 mg/dL (ref 8–23)
CO2: 23 mmol/L (ref 22–32)
Calcium: 9 mg/dL (ref 8.9–10.3)
Chloride: 98 mmol/L (ref 98–111)
Creatinine, Ser: 0.95 mg/dL (ref 0.61–1.24)
GFR calc Af Amer: >60 mL/min (ref >60)
GFR calc non Af Amer: >60 mL/min (ref >60)
Glucose, Bld: 89 mg/dL (ref 70–99)
Potassium: 3.8 mmol/L (ref 3.5–5.1)
Sodium: 131 mmol/L — ABNORMAL LOW (ref 135–145)
Total Bilirubin: 0.4 mg/dL (ref 0.3–1.2)
Total Protein: 6.9 g/dL (ref 6.5–8.1)

## 2019-10-14 LAB — CBC WITH DIFFERENTIAL/PLATELET
Abs Immature Granulocytes: 0.06 10*3/uL (ref 0.00–0.07)
Basophils Absolute: 0.1 10*3/uL (ref 0.0–0.1)
Basophils Relative: 1 %
Eosinophils Absolute: 0.2 10*3/uL (ref 0.0–0.5)
Eosinophils Relative: 1 %
HCT: 35.3 % — ABNORMAL LOW (ref 39.0–52.0)
Hemoglobin: 10.7 g/dL — ABNORMAL LOW (ref 13.0–17.0)
Immature Granulocytes: 1 %
Lymphocytes Relative: 29 %
Lymphs Abs: 3.8 10*3/uL (ref 0.7–4.0)
MCH: 23.9 pg — ABNORMAL LOW (ref 26.0–34.0)
MCHC: 30.3 g/dL (ref 30.0–36.0)
MCV: 78.8 fL — ABNORMAL LOW (ref 80.0–100.0)
Monocytes Absolute: 1.2 10*3/uL — ABNORMAL HIGH (ref 0.1–1.0)
Monocytes Relative: 9 %
Neutro Abs: 7.8 10*3/uL — ABNORMAL HIGH (ref 1.7–7.7)
Neutrophils Relative %: 59 %
Platelets: 327 10*3/uL (ref 150–400)
RBC: 4.48 MIL/uL (ref 4.22–5.81)
RDW: 18 % — ABNORMAL HIGH (ref 11.5–15.5)
WBC: 13.1 10*3/uL — ABNORMAL HIGH (ref 4.0–10.5)
nRBC: 0 % (ref 0.0–0.2)

## 2019-10-14 LAB — SALICYLATE LEVEL: Salicylate Lvl: <7 mg/dL — ABNORMAL LOW (ref 7.0–30.0)

## 2019-10-14 LAB — ETHANOL: Alcohol, Ethyl (B): <10 mg/dL (ref <10)

## 2019-10-14 LAB — ACETAMINOPHEN LEVEL: Acetaminophen (Tylenol), Serum: <10 ug/mL — ABNORMAL LOW (ref 10–30)

## 2019-10-14 LAB — POC SARS CORONAVIRUS 2 AG -  ED: SARS Coronavirus 2 Ag: NEGATIVE

## 2019-10-14 LAB — LIPASE, BLOOD: Lipase: 37 U/L (ref 11–51)

## 2019-10-14 LAB — TROPONIN I (HIGH SENSITIVITY): Troponin I (High Sensitivity): 4 ng/L (ref <18)

## 2019-10-14 MED ORDER — ACETAMINOPHEN 500 MG PO TABS
1000.0000 mg | ORAL_TABLET | Freq: Once | ORAL | Status: AC
  Administered 2019-10-14: 02:00:00 1000 mg via ORAL
  Filled 2019-10-14: qty 2

## 2019-10-14 MED ORDER — LORAZEPAM 2 MG/ML IJ SOLN
0.5000 mg | Freq: Once | INTRAMUSCULAR | Status: AC
  Administered 2019-10-14: 01:00:00 0.5 mg via INTRAVENOUS
  Filled 2019-10-14: qty 1

## 2019-10-14 NOTE — ED Notes (Signed)
Pt was found banging on the glass in the room and yelling, pt states that he wants to leave, pt cussing at staff, explained to pt that he is free he would be leaving AMA, MD notified, pt refused to sign AMA paperwork, escorted out by [employer] and security.

## 2019-10-14 NOTE — ED Notes (Signed)
Pt hooked back up to the monitor and given warm blankets

## 2019-10-14 NOTE — ED Provider Notes (Signed)
Patient seen/examined in the Emergency Department in conjunction with Advanced Practice Provider  Patient reports multiple complaints including vomiting/diarrhea and generalized pain Exam : awake/alert, appears anxious, maex4, no focal ABD tenderness Plan: labs pending at this time     Ripley Fraise, MD 10/14/19 0050

## 2019-10-14 NOTE — ED Notes (Signed)
This nurse entered pt room to find pt in his regular clothes and demanding to leave. Provider notified and now in room

## 2019-10-17 ENCOUNTER — Telehealth: Payer: Self-pay | Admitting: Psychiatry

## 2019-10-17 NOTE — Telephone Encounter (Signed)
Pt would like a refill on his adderall. Please send to [facility] at [facility].

## 2019-10-17 NOTE — Telephone Encounter (Signed)
Pt. Did have one a file and they will get it ready for him

## 2019-10-19 ENCOUNTER — Other Ambulatory Visit: Payer: Self-pay | Admitting: Psychiatry

## 2019-10-21 DIAGNOSIS — C44629 Squamous cell carcinoma of skin of left upper limb, including shoulder: Secondary | ICD-10-CM | POA: Diagnosis not present

## 2019-10-25 ENCOUNTER — Other Ambulatory Visit: Payer: Self-pay

## 2019-10-25 ENCOUNTER — Encounter: Payer: Self-pay | Admitting: Psychiatry

## 2019-10-25 ENCOUNTER — Ambulatory Visit (INDEPENDENT_AMBULATORY_CARE_PROVIDER_SITE_OTHER): Payer: PPO | Admitting: Psychiatry

## 2019-10-25 VITALS — BP 163/89 | HR 112

## 2019-10-25 DIAGNOSIS — F902 Attention-deficit hyperactivity disorder, combined type: Secondary | ICD-10-CM | POA: Diagnosis not present

## 2019-10-25 DIAGNOSIS — F17213 Nicotine dependence, cigarettes, with withdrawal: Secondary | ICD-10-CM | POA: Diagnosis not present

## 2019-10-25 DIAGNOSIS — F5105 Insomnia due to other mental disorder: Secondary | ICD-10-CM | POA: Diagnosis not present

## 2019-10-25 DIAGNOSIS — I10 Essential (primary) hypertension: Secondary | ICD-10-CM | POA: Diagnosis not present

## 2019-10-25 DIAGNOSIS — F411 Generalized anxiety disorder: Secondary | ICD-10-CM

## 2019-10-25 MED ORDER — AMPHETAMINE-DEXTROAMPHETAMINE 30 MG PO TABS: ORAL_TABLET | ORAL | 0 refills | Status: DC

## 2019-10-25 MED ORDER — ONDANSETRON HCL 4 MG PO TABS: 4.0000 mg | ORAL_TABLET | Freq: Three times a day (TID) | ORAL | 0 refills | Status: DC | PRN

## 2019-10-25 NOTE — Progress Notes (Signed)
David [name] [id number] 1956-05-27 64 y.o.  Subjective:   Patient ID:  David [name] is a 64 y.o. (DOB 11-23-1955) male.  Chief Complaint:  Chief Complaint  Patient presents with  . Follow-up    Medication Management  . ADHD    Medication Management  . Anxiety    Medication Management    Anxiety Symptoms include decreased concentration and nervous/anxious behavior. Patient reports no confusion or suicidal ideas.    Depression        Associated symptoms include decreased concentration.  Associated symptoms include no fatigue and no suicidal ideas.  Past medical history includes anxiety.    David [name] presents to the office today for follow-up of anxiety depression and ADD.  Last seen July 26, 2019.Marland Kitchen  No meds were changed  Had accident and fractured leg Jun 23, 2018 and had to have surgery with complications and incomplete recovery.  .Pain is better and can walk without assistance now.  But can't eat bc all teeth pulled and had complications from that.  Still dealing with that.  Dentures not right.  Has had a couple of infections that were in the skin and one cancer of skin.  Health has been a real drag on his mood over the last 18 mos.     Not as much things to enjoy as he'd like.  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    Havent' seen mother in a while DT Covid.  M is amazingly still alive.  [name] has been supportive.    Has panic every 6-8 weeks and will have to leave the building.  Wife also has panic and drinking to deal with thte panic.    Haven't smoked in 3 mos and pleased with that but it's still hard.    [name] died in motorcycle MVA 04/27/2019.  Very upset.  Was planning to go to [place] with him.   He would do wild things.  Working on doing creative things together.  Still grieving.     Can still be self defeating. On a variety of subjects.  .  Patient reports stable mood and denies depressed or irritable moods except as is typical  for him.  Anxiety over health px.  Patient denies difficulty with sleep initiation or maintenance. Denies appetite disturbance.  Patient reports that motivation have been good. .  Patient denies any suicidal ideation.  No drugs.  Not hard.  Less drinking significantly.  He has cut back because wife's drinking is bothering him.  Says he doing well with sobriety but still drinks regularly.  Denies getting drunk.  Disc risk of falling.   Has a new puppy but not over  M 64 yo.  Problems with PCP managing BP partly DT Covid.  Past Psychiatric Medication Trials: Fluoxetine 40, duloxetine, clonidine, hydroxyzine, Lyrica, testosterone, Adderall, Ambien, vitamin D, trazodone, Wellbutrin, venlafaxine Has been under the care of this practice since November 2000  Review of Systems:  Review of Systems  Constitutional: Negative for fatigue.  HENT: Positive for dental problem.   Musculoskeletal: Positive for arthralgias, back pain and gait problem.  Neurological: Positive for tremors.  Psychiatric/Behavioral: Positive for decreased concentration, depression, dysphoric mood and sleep disturbance. Negative for agitation, behavioral problems, confusion, hallucinations, self-injury and suicidal ideas. The patient is nervous/anxious. The patient is not hyperactive.     Medications: I have reviewed the patient's current medications.  Current Outpatient Medications  Medication Sig Dispense Refill  . amphetamine-dextroamphetamine (ADDERALL)  30 MG tablet Take 1 tablet by mouth in the morning, 1 tablet at noon, and 1/2 tablet at 4PM 75 tablet 0  . [START ON 11/22/2019] amphetamine-dextroamphetamine (ADDERALL) 30 MG tablet 1 in AM, 1 at noon, and 1/2 at 4PM 75 tablet 0  . [START ON 12/20/2019] amphetamine-dextroamphetamine (ADDERALL) 30 MG tablet Take 1 tablet by mouth in the morning, 1 tablet at noon, and 1/2 tablet at 4PM 75 tablet 0  . cetaphil (CETAPHIL) lotion Apply 1 application topically as needed for dry skin  (arms).    . cloNIDine (CATAPRES) 0.3 MG tablet TAKE 1 TABLET(0.3 MG) BY MOUTH TWICE DAILY 180 tablet 0  . FLUoxetine (PROZAC) 20 MG capsule TAKE 2 CAPSULES(40 MG) BY MOUTH DAILY 180 capsule 1  . hydrOXYzine (ATARAX/VISTARIL) 10 MG tablet TAKE 1 TABLET(10 MG) BY MOUTH THREE TIMES DAILY AS NEEDED 90 tablet 3  . metoprolol succinate (TOPROL-XL) 100 MG 24 hr tablet TAKE 1 TABLET(100 MG) BY MOUTH DAILY WITH LUNCH 30 tablet 1  . ondansetron (ZOFRAN) 4 MG tablet Take 1 tablet (4 mg total) by mouth every 8 (eight) hours as needed for nausea or vomiting. 30 tablet 0  . pregabalin (LYRICA) 150 MG capsule TAKE 1 CAPSULE(150 MG) BY MOUTH TWICE DAILY 60 capsule 5  . triamcinolone cream (KENALOG) 0.1 % Apply 1 application topically 2 (two) times daily as needed (psoriasis).    Marland Kitchen zolpidem (AMBIEN) 10 MG tablet Take 1.5 tablets (15 mg total) by mouth at bedtime. 45 tablet 2   No current facility-administered medications for this visit.    Medication Side Effects: None  Allergies:  Allergies  Allergen Reactions  . Codeine Nausea Only    Past Medical History:  Diagnosis Date  . ADD (attention deficit disorder)   . Anemia    Microcytic  . Arthritis   . Bipolar disorder ([place])   . Depression   . Dyspnea    history of no current issues 05/26/2019  . GAD (generalized anxiety disorder)   . History of kidney stones    passed  . HTN (hypertension)   . PTSD (post-traumatic stress disorder)   . Skin cancer     Family History  Problem Relation Age of Onset  . Arthritis Other   . Heart disease Other   . Cancer Other   . Hypertension Other     Social History   Socioeconomic History  . Marital status: Married    Spouse name: Not on file  . Number of children: Not on file  . Years of education: Not on file  . Highest education level: Not on file  Occupational History  . Not on file  Tobacco Use  . Smoking status: Former Smoker    Packs/day: 1.00    Years: 40.00    Pack years: 40.00     Quit date: 04/25/2019    Years since quitting: 0.5  . Smokeless tobacco: Never Used  Substance and Sexual Activity  . Alcohol use: Yes    Alcohol/week: 12.0 standard drinks    Types: 12 Cans of beer per week  . Drug use: Yes    Types: Marijuana  . Sexual activity: Not on file  Other Topics Concern  . Not on file  Social History Narrative  . Not on file   Social Determinants of Health   Financial Resource Strain:   . Difficulty of Paying Living Expenses: Not on file  Food Insecurity:   . Worried About Charity fundraiser in the Last Year:  Not on file  . Ran Out of Food in the Last Year: Not on file  Transportation Needs:   . Lack of Transportation (Medical): Not on file  . Lack of Transportation (Non-Medical): Not on file  Physical Activity:   . Days of Exercise per Week: Not on file  . Minutes of Exercise per Session: Not on file  Stress:   . Feeling of Stress : Not on file  Social Connections:   . Frequency of Communication with Friends and Family: Not on file  . Frequency of Social Gatherings with Friends and Family: Not on file  . Attends Religious Services: Not on file  . Active Member of Clubs or Organizations: Not on file  . Attends Archivist Meetings: Not on file  . Marital Status: Not on file  Intimate Partner Violence:   . Fear of Current or Ex-Partner: Not on file  . Emotionally Abused: Not on file  . Physically Abused: Not on file  . Sexually Abused: Not on file    Past Medical History, Surgical history, Social history, and Family history were reviewed and updated as appropriate.   Please see review of systems for further details on the patient's review from today.   Objective:   Physical Exam:  BP (!) 163/89   Pulse (!) 112   Physical Exam Constitutional:      Appearance: Normal appearance.  Neurological:     Mental Status: He is alert.     Motor: No tremor.     Gait: Gait abnormal.  Psychiatric:        Attention and Perception: He  is inattentive.        Mood and Affect: Mood is anxious and depressed. Affect is labile. Affect is not tearful.        Speech: Speech normal.        Behavior: Behavior is hyperactive. Behavior is not agitated or slowed.        Thought Content: Thought content is not paranoid. Thought content does not include homicidal or suicidal ideation.        Cognition and Memory: Cognition normal.     Comments: Fair insight and judgment. Chronically scattered.  More intense like his usual self.     Lab Review:     Component Value Date/Time   NA 131 (L) 10/13/2019 2340   K 3.8 10/13/2019 2340   CL 98 10/13/2019 2340   CO2 23 10/13/2019 2340   GLUCOSE 89 10/13/2019 2340   BUN 18 10/13/2019 2340   CREATININE 0.95 10/13/2019 2340   CREATININE TEST NOT PERFORMED 09/03/2012 1527   CALCIUM 9.0 10/13/2019 2340   PROT 6.9 10/13/2019 2340   ALBUMIN 3.9 10/13/2019 2340   AST 22 10/13/2019 2340   ALT 19 10/13/2019 2340   ALKPHOS 94 10/13/2019 2340   BILITOT 0.4 10/13/2019 2340   GFRNONAA >60 10/13/2019 2340   GFRNONAA TEST NOT PERFORMED 09/03/2012 1527   GFRAA >60 10/13/2019 2340   GFRAA TEST NOT PERFORMED 09/03/2012 1527       Component Value Date/Time   WBC 13.1 (H) 10/13/2019 2340   RBC 4.48 10/13/2019 2340   HGB 10.7 (L) 10/13/2019 2340   HCT 35.3 (L) 10/13/2019 2340   PLT 327 10/13/2019 2340   MCV 78.8 (L) 10/13/2019 2340   MCH 23.9 (L) 10/13/2019 2340   MCHC 30.3 10/13/2019 2340   RDW 18.0 (H) 10/13/2019 2340   LYMPHSABS 3.8 10/13/2019 2340   MONOABS 1.2 (H) 10/13/2019 2340  EOSABS 0.2 10/13/2019 2340   BASOSABS 0.1 10/13/2019 2340    No results found for: POCLITH, LITHIUM   No results found for: PHENYTOIN, PHENOBARB, VALPROATE, CBMZ   .res Assessment: Plan:    David [name] was seen today for follow-up, adhd and anxiety.  Diagnoses and all orders for this visit:  Generalized anxiety disorder  Attention deficit hyperactivity disorder (ADHD), combined type -      amphetamine-dextroamphetamine (ADDERALL) 30 MG tablet; Take 1 tablet by mouth in the morning, 1 tablet at noon, and 1/2 tablet at 4PM -     amphetamine-dextroamphetamine (ADDERALL) 30 MG tablet; 1 in AM, 1 at noon, and 1/2 at 4PM -     amphetamine-dextroamphetamine (ADDERALL) 30 MG tablet; Take 1 tablet by mouth in the morning, 1 tablet at noon, and 1/2 tablet at 4PM  Insomnia due to mental condition  Essential hypertension  Cigarette nicotine dependence with withdrawal  Other orders -     ondansetron (ZOFRAN) 4 MG tablet; Take 1 tablet (4 mg total) by mouth every 8 (eight) hours as needed for nausea or vomiting.     Greater than 50% of face to face time with patient was spent on counseling and coordination of care. We discussed that David [name] has been chronically disabled by severe ADD and some depression and anxiety.  He is chronically scattered and distracted even on the stimulant at the high dosage and clonidine.  Symptoms are worsened by persistent pain after his fractured hip.  He did not take clonidine this morning.  His blood pressure is high.  He has been taking twice a day in the evening.  Start taking it once in the morning and once in the evening.  Get back on clonidine bc blood pressure is high.  Clonidine is used both for blood pressure and off label for anxiety.  Supportive therapy with focus on self care and substance abuse focus.  Maintain sobriety. Supportive therapy re: dealing with mother who hasn't been able to get the vaccine.  Stressed dealing with that.   Problem solving and supportive thought therapy around getting wife help for her panic disorder.  Discussed potential benefits, risks, and side effects of stimulants with patient to include increased heart rate, palpitations, insomnia, increased anxiety, increased irritability, or decreased appetite.  Instructed patient to contact office if experiencing any significant tolerability issues.  Cont meds Prozac for depression  and anxiety and Adderall for ADD and clonidine for anxiety and hypertension and Lyrica for chronic pain and off label for anxiety.  Also Ambien for sleep  He wants to try to quit smoking.  He has taken Chantix before but had nausea.  He asked for nausea medicine because it is very important for him to try to quit smoking.  He believes that he can take Chantix and tolerated for 3 weeks he can quit smoking.  We will okay Phenergan.  Also discussed splitting the first dose of Chantix to reduce the nausea and taking a lower dose if necessary.  Also taking the evening dose at bedtime.  Discussed strategies for quitting smoking.    No med changes.   This appt was 30 mins.  Fu 3 mos  Lynder Parents, MD, DFAPA   Please see After Visit Summary for patient specific instructions.  Future Appointments  Date Time Provider [place]  11/22/2019  3:15 PM Gardiner Barefoot, DPM [facility]    No orders of the defined types were placed in this encounter.     -------------------------------

## 2019-10-25 NOTE — Patient Instructions (Signed)
Option [employer] for wife or [employer] or Thayer Headings psychiatric nurse practioners at [facility]

## 2019-11-13 ENCOUNTER — Other Ambulatory Visit: Payer: Self-pay | Admitting: Psychiatry

## 2019-11-22 ENCOUNTER — Ambulatory Visit: Payer: PPO | Admitting: Podiatry

## 2019-12-13 ENCOUNTER — Other Ambulatory Visit: Payer: Self-pay | Admitting: Psychiatry

## 2019-12-14 NOTE — Telephone Encounter (Signed)
Does the Zofran continue?

## 2019-12-22 ENCOUNTER — Other Ambulatory Visit: Payer: Self-pay

## 2019-12-22 ENCOUNTER — Emergency Department (HOSPITAL_BASED_OUTPATIENT_CLINIC_OR_DEPARTMENT_OTHER): Payer: PPO

## 2019-12-22 ENCOUNTER — Encounter (HOSPITAL_BASED_OUTPATIENT_CLINIC_OR_DEPARTMENT_OTHER): Payer: Self-pay

## 2019-12-22 ENCOUNTER — Emergency Department (HOSPITAL_BASED_OUTPATIENT_CLINIC_OR_DEPARTMENT_OTHER)
Admission: EM | Admit: 2019-12-22 | Discharge: 2019-12-23 | Disposition: A | Discharge status: Home / Self Care | Payer: PPO | Attending: Emergency Medicine | Admitting: Emergency Medicine

## 2019-12-22 DIAGNOSIS — Z20822 Contact with and (suspected) exposure to covid-19: Secondary | ICD-10-CM | POA: Insufficient documentation

## 2019-12-22 DIAGNOSIS — Z87891 Personal history of nicotine dependence: Secondary | ICD-10-CM | POA: Diagnosis not present

## 2019-12-22 DIAGNOSIS — R5383 Other fatigue: Secondary | ICD-10-CM | POA: Insufficient documentation

## 2019-12-22 DIAGNOSIS — L03313 Cellulitis of chest wall: Secondary | ICD-10-CM | POA: Diagnosis not present

## 2019-12-22 DIAGNOSIS — R4781 Slurred speech: Secondary | ICD-10-CM | POA: Diagnosis not present

## 2019-12-22 DIAGNOSIS — Z20828 Contact with and (suspected) exposure to other viral communicable diseases: Secondary | ICD-10-CM | POA: Diagnosis not present

## 2019-12-22 DIAGNOSIS — Z885 Allergy status to narcotic agent status: Secondary | ICD-10-CM | POA: Insufficient documentation

## 2019-12-22 DIAGNOSIS — R634 Abnormal weight loss: Secondary | ICD-10-CM | POA: Diagnosis not present

## 2019-12-22 DIAGNOSIS — I10 Essential (primary) hypertension: Secondary | ICD-10-CM | POA: Diagnosis not present

## 2019-12-22 LAB — CBC WITH DIFFERENTIAL/PLATELET
Abs Immature Granulocytes: 0.02 10*3/uL (ref 0.00–0.07)
Basophils Absolute: 0.1 10*3/uL (ref 0.0–0.1)
Basophils Relative: 1 %
Eosinophils Absolute: 0.2 10*3/uL (ref 0.0–0.5)
Eosinophils Relative: 2 %
HCT: 39 % (ref 39.0–52.0)
Hemoglobin: 12.1 g/dL — ABNORMAL LOW (ref 13.0–17.0)
Immature Granulocytes: 0 %
Lymphocytes Relative: 34 %
Lymphs Abs: 3.1 10*3/uL (ref 0.7–4.0)
MCH: 25.3 pg — ABNORMAL LOW (ref 26.0–34.0)
MCHC: 31 g/dL (ref 30.0–36.0)
MCV: 81.6 fL (ref 80.0–100.0)
Monocytes Absolute: 0.9 10*3/uL (ref 0.1–1.0)
Monocytes Relative: 10 %
Neutro Abs: 4.9 10*3/uL (ref 1.7–7.7)
Neutrophils Relative %: 53 %
Platelets: 468 10*3/uL — ABNORMAL HIGH (ref 150–400)
RBC: 4.78 MIL/uL (ref 4.22–5.81)
RDW: 17.2 % — ABNORMAL HIGH (ref 11.5–15.5)
WBC: 9.2 10*3/uL (ref 4.0–10.5)
nRBC: 0 % (ref 0.0–0.2)

## 2019-12-22 MED ORDER — LACTATED RINGERS IV BOLUS
1000.0000 mL | Freq: Once | INTRAVENOUS | Status: AC
  Administered 2019-12-22: 1000 mL via INTRAVENOUS

## 2019-12-22 NOTE — ED Triage Notes (Addendum)
Per pt and son pt with fatigue, weight loss, slurred speech, forgetful in conversation, urinary incontinent, scattered rash-sx started 2 days ago-denies fever/cough-pt NAD-steady gait to triage

## 2019-12-22 NOTE — ED Provider Notes (Signed)
Emergency Department Provider Note   I have reviewed the triage vital signs and the nursing notes.   HISTORY  Chief Complaint Fatigue   HPI David [name] is a 64 y.o. male with medical problems documented below who presents the emergency department today with his cousin secondary to generalized weakness.  Story is supplemented by his cousin who states that he called him on the phone a few days ago states going to visit and the patient was very excited and seemed like his normal self.  When the cousin arrived here from [place] the next day the patient was very sleepy.  Patient was sleeping more throughout the day than normal.  Patient apparently was slurring his words having difficulty eating and just drinking boost.  The patient apparently has some episodes of urinary incontinence which is new for him.  No focal weakness however the patient states over the last year he feels like his right arm is gotten weaker to the point where he cannot lift weights like he used to but is still able to use it for everyday tasks.  Also has developed a rash in the last 2 to 3 days around his neck that is pruritic, tender, warm and edematous.  He smokes marijuana and drinks 7 white calls a day and smokes about half to three quarters of a pack of cigarettes a day.  This is not new for him.  He is compliant with all his medications which none of them are new either.  Patient denies any abdominal pain, chest pain, cough, fever, diarrhea.  He does state he has had some unintentional weight loss of approximately 50 pounds in the last few months.   No other associated or modifying symptoms.    Past Medical History:  Diagnosis Date  . ADD (attention deficit disorder)   . Anemia    Microcytic  . Arthritis   . Bipolar disorder ([place])   . Depression   . Dyspnea    history of no current issues 05/26/2019  . GAD (generalized anxiety disorder)   . History of kidney stones    passed  . HTN (hypertension)   . PTSD  (post-traumatic stress disorder)   . Skin cancer     Patient Active Problem List   Diagnosis Date Noted  . Primary osteoarthritis of left hip 05/27/2019  . GAD (generalized anxiety disorder) 08/03/2018  . ADD (attention deficit disorder) 08/03/2018  . Closed right hip fracture, initial encounter ([place]) 06/23/2018  . HTN (hypertension) 06/23/2018  . Hypertensive urgency 06/23/2018  . Depression with anxiety 06/23/2018  . Microcytic anemia 06/23/2018  . Alcohol abuse with alcohol-induced mood disorder ([place]) 03/16/2018    Past Surgical History:  Procedure Laterality Date  . BACK SURGERY     Fusion - lumbar  . HIP PINNING,CANNULATED Right 06/24/2018   Procedure: RIGHT CANNULATED HIP PINNING;  Surgeon: Erle Crocker, MD;  Location: [facility];  Service: Orthopedics;  Laterality: Right;  . LITHOTRIPSY    . MULTIPLE EXTRACTIONS WITH ALVEOLOPLASTY Bilateral 05/03/2019   Procedure: MULTIPLE EXTRACTION TEETH NUMBER TWO, THREE, FIVE, SIX, SEVEN, EIGHT, NINE, TEN, ELEVEN, FIFTEEN, SIXTEEN, SEVENTEEN, NINETEEN, TWENTY, TWENTY-ONE, TWENTY-TWO, TWENTY-THREE, TWENTY-FOUR, TWENTY-FIVE, TWENTY-SIX, TWENTY-SEVEN, TWENTY-EIGHT, TWENTY-NINE, THIRTY-TWO WITH ALVEOLOPLASTY;  Surgeon: Diona Browner, DDS;  Location: [facility];  Service: Oral Surgery;  Laterality: Bilateral;  . NOSE SURGERY     x3  . TOTAL HIP ARTHROPLASTY Left 05/27/2019   Procedure: TOTAL HIP ARTHROPLASTY ANTERIOR APPROACH;  Surgeon: Dorna Leitz, MD;  Location: [facility];  Service: Orthopedics;  Laterality: Left;    Current Outpatient Rx  . Order #: [id number] Class: Normal  . Order #: [id number] Class: Normal  . Order #: [id number] Class: Normal  . Order #: [id number] Class: Print  . Order #: [id number] Class: Historical Med  . Order #: [id number] Class: Normal  . Order #: [id number] Class: Normal  . Order #: [id number] Class: Normal  . Order #: [id number] Class: Normal  . Order #: [id number] Class: Normal  . Order #: [id number] Class: Normal  . Order #:  [id number] Class: Historical Med  . Order #: [id number] Class: Normal    Allergies Codeine  Family History  Problem Relation Age of Onset  . Arthritis Other   . Heart disease Other   . Cancer Other   . Hypertension Other     Social History Social History   Tobacco Use  . Smoking status: Former Smoker    Packs/day: 1.00    Years: 40.00    Pack years: 40.00    Quit date: 04/25/2019    Years since quitting: 0.6  . Smokeless tobacco: Never Used  Substance Use Topics  . Alcohol use: Yes    Comment: daily-6-7/day  . Drug use: Yes    Types: Marijuana    Review of Systems  All other systems negative except as documented in the HPI. All pertinent positives and negatives as reviewed in the HPI. ____________________________________________   PHYSICAL EXAM:  VITAL SIGNS: ED Triage Vitals  Enc Vitals Group     BP 12/22/19 2152 (!) 153/85     Pulse Rate 12/22/19 2152 94     Resp 12/22/19 2152 20     Temp 12/22/19 2152 98 F (36.7 C)     Temp Source 12/22/19 2152 Oral     SpO2 12/22/19 2152 100 %     Weight 12/22/19 2152 159 lb (72.1 kg)     Height 12/22/19 2152 5\' 8"  (1.727 m)    Constitutional: Alert and oriented. Well appearing and in no acute distress. Eyes: Conjunctivae are normal. PERRL. EOMI. Head: Atraumatic. Nose: No congestion/rhinnorhea. Mouth/Throat: Mucous membranes are moist.  Oropharynx non-erythematous. Neck: No stridor.  No meningeal signs.   Cardiovascular: Normal rate, regular rhythm. Good peripheral circulation. Grossly normal heart sounds.   Respiratory: Normal respiratory effort.  No retractions. Lungs CTAB. Gastrointestinal: Soft and nontender. No distention.  Musculoskeletal: No lower extremity tenderness nor edema. No gross deformities of extremities. Neurologic: Patient has slowed speech but not really slurred.  He is able to enunciate and speak clearly.  Has eyelid diminished ability to smile on the right side.  Strength is equal in grip and  hold in his arms against gravity.  He has equal bicep, patellar and Achilles reflexes.  Normal extraocular movements.  Pupils are equal round reactive to light.  Tongue protrudes midline.  Uvula midline. Sensation to light touch intact.  Skin:  Skin is warm, dry and intact.  Has a rash approximately 7 to 8 cm in width bandlike around the front of his neck that is warmer, erythematous and edematous compared to surrounding skin.  He also has significantly decreased cap refill around 6 seconds in his fingers.  Pale nailbeds   ____________________________________________   LABS (all labs ordered are listed, but only abnormal results are displayed)  Labs Reviewed  CBC WITH DIFFERENTIAL/PLATELET - Abnormal; Notable for the following components:      Result Value   Hemoglobin 12.1 (*)    MCH 25.3 (*)    RDW 17.2 (*)    Platelets 468 (*)  All other components within normal limits  COMPREHENSIVE METABOLIC PANEL - Abnormal; Notable for the following components:   Sodium 134 (*)    Glucose, Bld 104 (*)    Total Bilirubin 0.2 (*)    All other components within normal limits  AMMONIA - Abnormal; Notable for the following components:   Ammonia <9 (*)    All other components within normal limits  SEDIMENTATION RATE - Abnormal; Notable for the following components:   Sed Rate 30 (*)    All other components within normal limits  SARS CORONAVIRUS 2 (TAT 6-24 HRS)  LIPASE, BLOOD  URINALYSIS, ROUTINE W REFLEX MICROSCOPIC  C-REACTIVE PROTEIN   ____________________________________________  EKG   EKG Interpretation  Date/Time:  Thursday December 22 2019 23:57:58 EDT Ventricular Rate:  73 PR Interval:    QRS Duration: 99 QT Interval:  398 QTC Calculation: 439 R Axis:   62 Text Interpretation: Sinus rhythm Abnormal inferior Q waves q waves similar to january 28 Confirmed by Merrily Pew [phone]) on 12/23/2019 12:30:10 AM       ____________________________________________  RADIOLOGY  DG  Chest 2 View  Result Date: 12/22/2019 CLINICAL DATA:  Fatigue, weight loss, weakness EXAM: CHEST - 2 VIEW COMPARISON:  08/28/2018 FINDINGS: Heart and mediastinal contours are within normal limits. No focal opacities or effusions. No acute bony abnormality. Old healed posterior right 6th rib fracture, stable. IMPRESSION: No active cardiopulmonary disease. Electronically Signed   By: Rolm Baptise M.D.   On: 12/22/2019 23:43   CT Head Wo Contrast  Result Date: 12/22/2019 CLINICAL DATA:  Fatigue, weight loss, slurred speech EXAM: CT HEAD WITHOUT CONTRAST TECHNIQUE: Contiguous axial images were obtained from the base of the skull through the vertex without intravenous contrast. COMPARISON:  08/28/2018 FINDINGS: Brain: There is atrophy and chronic small vessel disease changes. No acute intracranial abnormality. Specifically, no hemorrhage, hydrocephalus, mass lesion, acute infarction, or significant intracranial injury. Vascular: No hyperdense vessel or unexpected calcification. Skull: No acute calvarial abnormality. Sinuses/Orbits: Visualized paranasal sinuses and mastoids clear. Orbital soft tissues unremarkable. Other: None IMPRESSION: Atrophy, chronic microvascular disease. No acute intracranial abnormality. Electronically Signed   By: Rolm Baptise M.D.   On: 12/22/2019 23:43    ____________________________________________   PROCEDURES  Procedure(s) performed:   Procedures   ____________________________________________   INITIAL IMPRESSION / ASSESSMENT AND PLAN / ED COURSE  Multiple possible differential considerations.  Does appear he likely has some type of cellulitis or dermatitis of his upper neck which very well could be related everything however with his longstanding weight loss, decreased capillary refill also concern for cancer versus anemia.  Also could have had a stroke.  Could have some type of cauda equina syndrome however no back pain and normal reflexes and strength in his lower  extremities.   Explained that I cannot do full work-up in the emergency room but can do specific testing for specific things we will start with CBC, CMP, ammonia, chest x-ray and head CT scan.  Workup overall unremarkable compared to baseline. Will treat for likely cellulitis with pcp follow up for forther workup as warranted and to ensure improvement.   Pertinent labs & imaging results that were available during my care of the patient were reviewed by me and considered in my medical decision making (see chart for details).  A medical screening exam was performed and I feel the patient has had an appropriate workup for their chief complaint at this time and likelihood of emergent condition existing is low. They have been  counseled on decision, discharge, follow up and which symptoms necessitate immediate return to the emergency department. They or their family verbally stated understanding and agreement with plan and discharged in stable condition.   ____________________________________________  FINAL CLINICAL IMPRESSION(S) / ED DIAGNOSES  Final diagnoses:  Other fatigue  Cellulitis of chest wall     MEDICATIONS GIVEN DURING THIS VISIT:  Medications  lactated ringers bolus 1,000 mL ( Intravenous Stopped 12/23/19 0050)  cefTRIAXone (ROCEPHIN) 1 g in sodium chloride 0.9 % 100 mL IVPB ( Intravenous Stopped 12/23/19 0228)     NEW OUTPATIENT MEDICATIONS STARTED DURING THIS VISIT:  Discharge Medication List as of 12/23/2019  2:10 AM    START taking these medications   Details  cephALEXin (KEFLEX) 500 MG capsule 2 caps po bid x 7 days, Print        Note:  This note was prepared with assistance of Dragon voice recognition software. Occasional wrong-word or sound-a-like substitutions may have occurred due to the inherent limitations of voice recognition software.   Elcie Pelster, Corene Cornea, MD 12/23/19 [phone]

## 2019-12-23 DIAGNOSIS — R5383 Other fatigue: Secondary | ICD-10-CM | POA: Diagnosis not present

## 2019-12-23 LAB — COMPREHENSIVE METABOLIC PANEL
ALT: 26 U/L (ref 0–44)
AST: 32 U/L (ref 15–41)
Albumin: 3.9 g/dL (ref 3.5–5.0)
Alkaline Phosphatase: 123 U/L (ref 38–126)
Anion gap: 9 (ref 5–15)
BUN: 16 mg/dL (ref 8–23)
CO2: 26 mmol/L (ref 22–32)
Calcium: 9.5 mg/dL (ref 8.9–10.3)
Chloride: 99 mmol/L (ref 98–111)
Creatinine, Ser: 1.08 mg/dL (ref 0.61–1.24)
GFR calc Af Amer: >60 mL/min (ref >60)
GFR calc non Af Amer: >60 mL/min (ref >60)
Glucose, Bld: 104 mg/dL — ABNORMAL HIGH (ref 70–99)
Potassium: 4.2 mmol/L (ref 3.5–5.1)
Sodium: 134 mmol/L — ABNORMAL LOW (ref 135–145)
Total Bilirubin: 0.2 mg/dL — ABNORMAL LOW (ref 0.3–1.2)
Total Protein: 7.7 g/dL (ref 6.5–8.1)

## 2019-12-23 LAB — URINALYSIS, ROUTINE W REFLEX MICROSCOPIC
Bilirubin Urine: NEGATIVE
Glucose, UA: NEGATIVE mg/dL
Hgb urine dipstick: NEGATIVE
Ketones, ur: NEGATIVE mg/dL
Leukocytes,Ua: NEGATIVE
Nitrite: NEGATIVE
Protein, ur: NEGATIVE mg/dL
Specific Gravity, Urine: 1.01 (ref 1.005–1.030)
pH: 6 (ref 5.0–8.0)

## 2019-12-23 LAB — SEDIMENTATION RATE: Sed Rate: 30 mm/hr — ABNORMAL HIGH (ref 0–16)

## 2019-12-23 LAB — SARS CORONAVIRUS 2 (TAT 6-24 HRS): SARS Coronavirus 2: NEGATIVE

## 2019-12-23 LAB — AMMONIA: Ammonia: <9 umol/L — ABNORMAL LOW (ref 9–35)

## 2019-12-23 LAB — LIPASE, BLOOD: Lipase: 25 U/L (ref 11–51)

## 2019-12-23 LAB — C-REACTIVE PROTEIN: CRP: 0.7 mg/dL (ref <1.0)

## 2019-12-23 MED ORDER — CEPHALEXIN 500 MG PO CAPS: ORAL_CAPSULE | ORAL | 0 refills | Status: DC

## 2019-12-23 MED ORDER — SODIUM CHLORIDE 0.9 % IV SOLN
1.0000 g | Freq: Once | INTRAVENOUS | Status: AC
  Administered 2019-12-23: 02:00:00 1 g via INTRAVENOUS
  Filled 2019-12-23: qty 10

## 2019-12-23 NOTE — ED Notes (Signed)
Pt sitting on side of bed and states " I want to go home I feel fine". Family member at bedside.

## 2019-12-26 ENCOUNTER — Emergency Department (HOSPITAL_BASED_OUTPATIENT_CLINIC_OR_DEPARTMENT_OTHER)
Admission: EM | Admit: 2019-12-26 | Discharge: 2019-12-26 | Disposition: A | Discharge status: Home / Self Care | Payer: PPO | Attending: Emergency Medicine | Admitting: Emergency Medicine

## 2019-12-26 ENCOUNTER — Encounter (HOSPITAL_BASED_OUTPATIENT_CLINIC_OR_DEPARTMENT_OTHER): Payer: Self-pay

## 2019-12-26 ENCOUNTER — Other Ambulatory Visit: Payer: Self-pay

## 2019-12-26 DIAGNOSIS — F1210 Cannabis abuse, uncomplicated: Secondary | ICD-10-CM | POA: Diagnosis not present

## 2019-12-26 DIAGNOSIS — F17210 Nicotine dependence, cigarettes, uncomplicated: Secondary | ICD-10-CM | POA: Insufficient documentation

## 2019-12-26 DIAGNOSIS — Z79899 Other long term (current) drug therapy: Secondary | ICD-10-CM | POA: Diagnosis not present

## 2019-12-26 DIAGNOSIS — R5383 Other fatigue: Secondary | ICD-10-CM | POA: Diagnosis not present

## 2019-12-26 DIAGNOSIS — F1410 Cocaine abuse, uncomplicated: Secondary | ICD-10-CM | POA: Diagnosis not present

## 2019-12-26 DIAGNOSIS — I10 Essential (primary) hypertension: Secondary | ICD-10-CM | POA: Insufficient documentation

## 2019-12-26 DIAGNOSIS — L03313 Cellulitis of chest wall: Secondary | ICD-10-CM

## 2019-12-26 DIAGNOSIS — R21 Rash and other nonspecific skin eruption: Secondary | ICD-10-CM | POA: Diagnosis present

## 2019-12-26 DIAGNOSIS — Z96642 Presence of left artificial hip joint: Secondary | ICD-10-CM | POA: Diagnosis not present

## 2019-12-26 LAB — COMPREHENSIVE METABOLIC PANEL
ALT: 18 U/L (ref 0–44)
AST: 22 U/L (ref 15–41)
Albumin: 3.7 g/dL (ref 3.5–5.0)
Alkaline Phosphatase: 111 U/L (ref 38–126)
Anion gap: 10 (ref 5–15)
BUN: 12 mg/dL (ref 8–23)
CO2: 25 mmol/L (ref 22–32)
Calcium: 8.8 mg/dL — ABNORMAL LOW (ref 8.9–10.3)
Chloride: 97 mmol/L — ABNORMAL LOW (ref 98–111)
Creatinine, Ser: 1.05 mg/dL (ref 0.61–1.24)
GFR calc Af Amer: >60 mL/min (ref >60)
GFR calc non Af Amer: >60 mL/min (ref >60)
Glucose, Bld: 125 mg/dL — ABNORMAL HIGH (ref 70–99)
Potassium: 4 mmol/L (ref 3.5–5.1)
Sodium: 132 mmol/L — ABNORMAL LOW (ref 135–145)
Total Bilirubin: 0.4 mg/dL (ref 0.3–1.2)
Total Protein: 7.1 g/dL (ref 6.5–8.1)

## 2019-12-26 LAB — LACTIC ACID, PLASMA: Lactic Acid, Venous: 1.2 mmol/L (ref 0.5–1.9)

## 2019-12-26 LAB — CBC
HCT: 35.7 % — ABNORMAL LOW (ref 39.0–52.0)
Hemoglobin: 11.3 g/dL — ABNORMAL LOW (ref 13.0–17.0)
MCH: 25.8 pg — ABNORMAL LOW (ref 26.0–34.0)
MCHC: 31.7 g/dL (ref 30.0–36.0)
MCV: 81.5 fL (ref 80.0–100.0)
Platelets: 508 10*3/uL — ABNORMAL HIGH (ref 150–400)
RBC: 4.38 MIL/uL (ref 4.22–5.81)
RDW: 17 % — ABNORMAL HIGH (ref 11.5–15.5)
WBC: 14.2 10*3/uL — ABNORMAL HIGH (ref 4.0–10.5)
nRBC: 0 % (ref 0.0–0.2)

## 2019-12-26 LAB — RAPID URINE DRUG SCREEN, HOSP PERFORMED
Amphetamines: NOT DETECTED
Barbiturates: NOT DETECTED
Benzodiazepines: NOT DETECTED
Cocaine: POSITIVE — AB
Opiates: NOT DETECTED
Tetrahydrocannabinol: POSITIVE — AB

## 2019-12-26 LAB — ETHANOL: Alcohol, Ethyl (B): <10 mg/dL (ref <10)

## 2019-12-26 MED ORDER — DOXYCYCLINE HYCLATE 100 MG PO TABS: 100.0000 mg | ORAL_TABLET | Freq: Two times a day (BID) | ORAL | 0 refills | Status: DC

## 2019-12-26 MED ORDER — SODIUM CHLORIDE 0.9 % IV SOLN
1.0000 g | Freq: Once | INTRAVENOUS | Status: AC
  Administered 2019-12-26: 12:00:00 1 g via INTRAVENOUS
  Filled 2019-12-26: qty 10

## 2019-12-26 MED ORDER — SODIUM CHLORIDE 0.9 % IV SOLN
INTRAVENOUS | Status: DC | PRN
  Administered 2019-12-26: 250 mL via INTRAVENOUS

## 2019-12-26 NOTE — Discharge Instructions (Addendum)
Take the antibiotics as prescribed.  Take over-the-counter medications as needed for pain.  Follow-up with your primary care doctor and dermatologist to make sure this is improving.  Make sure to follow up with your primary doctor regarding your blood pressure

## 2019-12-26 NOTE — ED Triage Notes (Signed)
Pt arrives with wife who reports pt took an Ambien about an hour PTA r/t pain and itching at rash around is neck. Pt is sleepy during triage.

## 2019-12-26 NOTE — ED Provider Notes (Signed)
[facility] EMERGENCY DEPARTMENT Provider Note   CSN: [id number] Arrival date & time: 12/26/19  0849     History Chief Complaint  Patient presents with  . Rash    David [name] is a 64 y.o. male.  HPI   Patient presents to the ED for evaluation of pain associated with a rash on his chest wall.  Patient states that started sometime last week.  Patient was having issues with fatigue associated with a tender itchy rash on the anterior aspect of his chest.  He ended up coming to the emergency room on April 8.  Patient was diagnosed with cephalexin.  He was prescribed Keflex.  According to his family member he seemed to be doing better after a couple days.  This morning he woke up complaining of pain and discomfort.  His family member noticed that he was having some clear liquid drainage from the rash.  Because of the patient's discomfort he took an Ambien this morning.  He is rather somnolent now although will answer questions but the history is primarily provided by the family member.  No known fevers.  No vomiting or diarrhea.  No new medications.  No excessive sun exposure  Past Medical History:  Diagnosis Date  . ADD (attention deficit disorder)   . Anemia    Microcytic  . Arthritis   . Bipolar disorder ([place])   . Depression   . Dyspnea    history of no current issues 05/26/2019  . GAD (generalized anxiety disorder)   . History of kidney stones    passed  . HTN (hypertension)   . PTSD (post-traumatic stress disorder)   . Skin cancer     Patient Active Problem List   Diagnosis Date Noted  . Primary osteoarthritis of left hip 05/27/2019  . GAD (generalized anxiety disorder) 08/03/2018  . ADD (attention deficit disorder) 08/03/2018  . Closed right hip fracture, initial encounter ([place]) 06/23/2018  . HTN (hypertension) 06/23/2018  . Hypertensive urgency 06/23/2018  . Depression with anxiety 06/23/2018  . Microcytic anemia 06/23/2018  . Alcohol abuse with  alcohol-induced mood disorder ([place]) 03/16/2018    Past Surgical History:  Procedure Laterality Date  . BACK SURGERY     Fusion - lumbar  . HIP PINNING,CANNULATED Right 06/24/2018   Procedure: RIGHT CANNULATED HIP PINNING;  Surgeon: Erle Crocker, MD;  Location: [facility];  Service: Orthopedics;  Laterality: Right;  . LITHOTRIPSY    . MULTIPLE EXTRACTIONS WITH ALVEOLOPLASTY Bilateral 05/03/2019   Procedure: MULTIPLE EXTRACTION TEETH NUMBER TWO, THREE, FIVE, SIX, SEVEN, EIGHT, NINE, TEN, ELEVEN, FIFTEEN, SIXTEEN, SEVENTEEN, NINETEEN, TWENTY, TWENTY-ONE, TWENTY-TWO, TWENTY-THREE, TWENTY-FOUR, TWENTY-FIVE, TWENTY-SIX, TWENTY-SEVEN, TWENTY-EIGHT, TWENTY-NINE, THIRTY-TWO WITH ALVEOLOPLASTY;  Surgeon: Diona Browner, DDS;  Location: [facility];  Service: Oral Surgery;  Laterality: Bilateral;  . NOSE SURGERY     x3  . TOTAL HIP ARTHROPLASTY Left 05/27/2019   Procedure: TOTAL HIP ARTHROPLASTY ANTERIOR APPROACH;  Surgeon: Dorna Leitz, MD;  Location: [facility];  Service: Orthopedics;  Laterality: Left;       Family History  Problem Relation Age of Onset  . Arthritis Other   . Heart disease Other   . Cancer Other   . Hypertension Other     Social History   Tobacco Use  . Smoking status: Current Every Day Smoker    Packs/day: 1.00    Years: 40.00    Pack years: 40.00    Last attempt to quit: 04/25/2019    Years since quitting: 0.6  .  Smokeless tobacco: Never Used  Substance Use Topics  . Alcohol use: Yes    Comment: daily-6-7/day  . Drug use: Not Currently    Types: Marijuana    Home Medications Prior to Admission medications   Medication Sig Start Date End Date Taking? Authorizing Provider  amphetamine-dextroamphetamine (ADDERALL) 30 MG tablet Take 1 tablet by mouth in the morning, 1 tablet at noon, and 1/2 tablet at 4PM 10/25/19   Cottle, Billey Co., MD  amphetamine-dextroamphetamine (ADDERALL) 30 MG tablet 1 in AM, 1 at noon, and 1/2 at [facility] 11/22/19   Cottle, Billey Co., MD    amphetamine-dextroamphetamine (ADDERALL) 30 MG tablet Take 1 tablet by mouth in the morning, 1 tablet at noon, and 1/2 tablet at 4PM 12/20/19   Cottle, Billey Co., MD  cephALEXin (KEFLEX) 500 MG capsule 2 caps po bid x 7 days 12/23/19   Mesner, Corene Cornea, MD  cetaphil (CETAPHIL) lotion Apply 1 application topically as needed for dry skin (arms).    [provider]  cloNIDine (CATAPRES) 0.3 MG tablet TAKE 1 TABLET(0.3 MG) BY MOUTH TWICE DAILY 09/19/19   Cottle, Billey Co., MD  doxycycline (VIBRA-TABS) 100 MG tablet Take 1 tablet (100 mg total) by mouth 2 (two) times daily. 12/26/19   Dorie Rank, MD  FLUoxetine (PROZAC) 20 MG capsule TAKE 2 CAPSULES(40 MG) BY MOUTH DAILY 11/14/19   Cottle, Billey Co., MD  hydrOXYzine (ATARAX/VISTARIL) 10 MG tablet TAKE 1 TABLET(10 MG) BY MOUTH THREE TIMES DAILY AS NEEDED 09/19/19   Cottle, Billey Co., MD  metoprolol succinate (TOPROL-XL) 100 MG 24 hr tablet TAKE 1 TABLET(100 MG) BY MOUTH DAILY WITH LUNCH 12/14/19   Cottle, Billey Co., MD  ondansetron (ZOFRAN) 4 MG tablet TAKE 1 TABLET(4 MG) BY MOUTH EVERY 8 HOURS AS NEEDED FOR NAUSEA OR VOMITING 12/14/19   Cottle, Billey Co., MD  pregabalin (LYRICA) 150 MG capsule TAKE 1 CAPSULE(150 MG) BY MOUTH TWICE DAILY 08/26/19   Cottle, Billey Co., MD  triamcinolone cream (KENALOG) 0.1 % Apply 1 application topically 2 (two) times daily as needed (psoriasis).    [provider]  zolpidem (AMBIEN) 10 MG tablet TAKE 1 AND 1/2 TABLETS(15 MG) BY MOUTH AT BEDTIME 12/14/19   Cottle, Billey Co., MD    Allergies    Codeine  Review of Systems   Review of Systems  All other systems reviewed and are negative.   Physical Exam Updated Vital Signs BP (!) 181/93   Pulse (!) 48   Temp 97.6 F (36.4 C) (Oral)   Resp 17   Ht 1.727 m (5\' 8" )   Wt 74.8 kg   SpO2 99%   BMI 25.09 kg/m   Physical Exam Vitals and nursing note reviewed.  Constitutional:      General: He is not in acute distress.    Appearance: He is  well-developed.     Comments: Somnolent  HENT:     Head: Normocephalic and atraumatic.     Right Ear: External ear normal.     Left Ear: External ear normal.  Eyes:     General: No scleral icterus.       Right eye: No discharge.        Left eye: No discharge.     Conjunctiva/sclera: Conjunctivae normal.  Neck:     Trachea: No tracheal deviation.  Cardiovascular:     Rate and Rhythm: Normal rate and regular rhythm.  Pulmonary:     Effort: Pulmonary  effort is normal. No respiratory distress.     Breath sounds: Normal breath sounds. No stridor. No wheezing or rales.  Abdominal:     General: Bowel sounds are normal. There is no distension.     Palpations: Abdomen is soft.     Tenderness: There is no abdominal tenderness. There is no guarding or rebound.  Musculoskeletal:        General: No tenderness.     Cervical back: Neck supple.  Skin:    General: Skin is warm and dry.     Findings: Rash present.     Comments: Erythematous rash in a V-shaped pattern on the anterior chest wall, area is tender to palpation, serous drainage noted  Neurological:     Cranial Nerves: No cranial nerve deficit (no facial droop, extraocular movements intact, no slurred speech).     Sensory: No sensory deficit.     Motor: No abnormal muscle tone or seizure activity.     Coordination: Coordination normal.     Comments: Slow to answer questions but will wake up and respond     ED Results / Procedures / Treatments   Labs (all labs ordered are listed, but only abnormal results are displayed) Labs Reviewed  CBC - Abnormal; Notable for the following components:      Result Value   WBC 14.2 (*)    Hemoglobin 11.3 (*)    HCT 35.7 (*)    MCH 25.8 (*)    RDW 17.0 (*)    Platelets 508 (*)    All other components within normal limits  COMPREHENSIVE METABOLIC PANEL - Abnormal; Notable for the following components:   Sodium 132 (*)    Chloride 97 (*)    Glucose, Bld 125 (*)    Calcium 8.8 (*)    All  other components within normal limits  RAPID URINE DRUG SCREEN, HOSP PERFORMED - Abnormal; Notable for the following components:   Cocaine POSITIVE (*)    Tetrahydrocannabinol POSITIVE (*)    All other components within normal limits  CULTURE, BLOOD (ROUTINE X 2)  CULTURE, BLOOD (ROUTINE X 2)  LACTIC ACID, PLASMA  ETHANOL    EKG EKG Interpretation  Date/Time:  Monday December 26 2019 09:17:26 EDT Ventricular Rate:  47 PR Interval:    QRS Duration: 98 QT Interval:  457 QTC Calculation: 404 R Axis:   67 Text Interpretation: Sinus bradycardia Since last tracing rate slower Confirmed by Dorie Rank [phone]) on 12/26/2019 9:24:56 AM   Radiology No results found.  Procedures Procedures (including critical care time)  Medications Ordered in ED Medications  0.9 %  sodium chloride infusion (250 mLs Intravenous New Bag/Given 12/26/19 1132)  cefTRIAXone (ROCEPHIN) 1 g in sodium chloride 0.9 % 100 mL IVPB (1 g Intravenous New Bag/Given 12/26/19 1134)    ED Course  I have reviewed the triage vital signs and the nursing notes.  Pertinent labs & imaging results that were available during my care of the patient were reviewed by me and considered in my medical decision making (see chart for details).  Clinical Course as of Dec 26 1303  Mon Dec 26, 2019  1302 Labs reviewed.  White blood cell count elevated.  Electrolyte panel unremarkable.  UDS positive for cocaine and marijuana.  Lactic acid level normal   [JK]  1303 Patient is more alert and awake.     [JK]    Clinical Course User Index [JK] Dorie Rank, MD   MDM Rules/Calculators/A&P  Patient presented to ED for evaluation of a rash.  Patient is noted to have some serous drainage from his anterior chest wall.  The margins are very well demarcated.  Some type of contact type dermatitis is suggested by this pattern however the tenderness in the drainage suggests possible cellulitis.  Patient is afebrile and nontoxic.   He was given a dose of IV antibiotics.  I will give him prescription for doxycycline.  Patient does have history of skin cancer and I think it is important for him to follow-up with his dermatologist.  I discussed this with the patient and his family.  Patient took a sleeping pill before he arrived this morning.  He is now more awake and alert.  Hesitant to prescribe any opiate pain medications considering his several other medications that he takes.  Recommend over-the-counter medications and antibiotics.  Close follow-up as discussed Final Clinical Impression(s) / ED Diagnoses Final diagnoses:  Cellulitis of chest wall  Hypertension, unspecified type    Rx / DC Orders ED Discharge Orders         Ordered    doxycycline (VIBRA-TABS) 100 MG tablet  2 times daily     12/26/19 1305           Dorie Rank, MD 12/26/19 1305

## 2019-12-28 DIAGNOSIS — L0291 Cutaneous abscess, unspecified: Secondary | ICD-10-CM | POA: Diagnosis not present

## 2019-12-31 LAB — CULTURE, BLOOD (ROUTINE X 2)
Culture: NO GROWTH
Culture: NO GROWTH
Special Requests: ADEQUATE
Special Requests: ADEQUATE

## 2020-01-03 ENCOUNTER — Encounter: Payer: Self-pay | Admitting: Psychiatry

## 2020-01-03 ENCOUNTER — Other Ambulatory Visit: Payer: Self-pay

## 2020-01-03 ENCOUNTER — Ambulatory Visit (INDEPENDENT_AMBULATORY_CARE_PROVIDER_SITE_OTHER): Payer: PPO | Admitting: Psychiatry

## 2020-01-03 DIAGNOSIS — F411 Generalized anxiety disorder: Secondary | ICD-10-CM | POA: Diagnosis not present

## 2020-01-03 DIAGNOSIS — I10 Essential (primary) hypertension: Secondary | ICD-10-CM | POA: Diagnosis not present

## 2020-01-03 DIAGNOSIS — F902 Attention-deficit hyperactivity disorder, combined type: Secondary | ICD-10-CM

## 2020-01-03 DIAGNOSIS — F308 Other manic episodes: Secondary | ICD-10-CM | POA: Diagnosis not present

## 2020-01-03 DIAGNOSIS — F5105 Insomnia due to other mental disorder: Secondary | ICD-10-CM | POA: Diagnosis not present

## 2020-01-03 MED ORDER — OLANZAPINE 10 MG PO TABS
ORAL_TABLET | ORAL | 1 refills | Status: DC
Start: 2020-01-03 — End: 2020-02-16

## 2020-01-03 NOTE — Progress Notes (Signed)
David [name] [id number] March 18, 1956 64 y.o.  Subjective:   Patient ID:  David [name] is a 64 y.o. (DOB 1956-08-31) male.  Chief Complaint:  Chief Complaint  Patient presents with  . Follow-up    Mood, anxiety, sleep, meds    Anxiety Symptoms include decreased concentration and nervous/anxious behavior. Patient reports no confusion or suicidal ideas.    Depression        Associated symptoms include decreased concentration.  Associated symptoms include no fatigue and no suicidal ideas.  Past medical history includes anxiety.    [name] presents to the office today for follow-up of anxiety depression and ADD.  Last seen October 25, 2019.  No meds were changed  Had accident and fractured leg Jun 23, 2018 and had to have surgery with complications and incomplete recovery. .Pain is better and can walk without assistance now.  But can't eat bc all teeth pulled and had complications from that.  Still dealing with that.  Dentures not right. Has had a couple of infections that were in the skin and one cancer of skin.  Health has been a real drag on his mood over the last 18 mos.     Not as much things to enjoy as he'd like.  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    As of appointment January 03, 2020 he reports the following: Moved appt up.  Nephew in [place] and [name] is all he has.  Moving to [place].   Just finished prednisone.Was wired on it.  Wife noted it also.  Couldn't sleep and amped up.    Havent' seen mother in a while DT Covid.  M is amazingly still alive.  [name] has been supportive.    Has panic every 6-8 weeks and will have to leave the building.  Wife also has panic and drinking to deal with thte panic.    Haven't smoked in 3 mos and pleased with that but it's still hard.    [name] died in motorcycle MVA 05-06-19.  Very upset.  Was planning to go to [place] with him.    No drugs.  Not hard.  Less drinking significantly.  He has cut back because  wife's drinking is bothering him.  Says he doing well with sobriety but still drinks regularly.  Denies getting drunk.  Disc risk of falling.   M 64 yo.  Past Psychiatric Medication Trials: Fluoxetine 40, duloxetine, clonidine, hydroxyzine, Lyrica, testosterone, Adderall, Ambien, vitamin D, trazodone, Wellbutrin, venlafaxine Has been under the care of this practice since November 2000  Review of Systems:  Review of Systems  Constitutional: Negative for fatigue.  HENT: Positive for dental problem.   Musculoskeletal: Positive for arthralgias, back pain and gait problem.  Skin: Positive for rash.  Neurological: Positive for tremors.  Psychiatric/Behavioral: Positive for decreased concentration, depression, dysphoric mood and sleep disturbance. Negative for agitation, behavioral problems, confusion, hallucinations, self-injury and suicidal ideas. The patient is nervous/anxious. The patient is not hyperactive.     Medications: I have reviewed the patient's current medications.  Current Outpatient Medications  Medication Sig Dispense Refill  . amphetamine-dextroamphetamine (ADDERALL) 30 MG tablet Take 1 tablet by mouth in the morning, 1 tablet at noon, and 1/2 tablet at 4PM 75 tablet 0  . amphetamine-dextroamphetamine (ADDERALL) 30 MG tablet 1 in AM, 1 at noon, and 1/2 at 4PM 75 tablet 0  . amphetamine-dextroamphetamine (ADDERALL) 30 MG tablet Take 1 tablet by mouth in the morning, 1  tablet at noon, and 1/2 tablet at 4PM 75 tablet 0  . cephALEXin (KEFLEX) 500 MG capsule 2 caps po bid x 7 days 28 capsule 0  . cetaphil (CETAPHIL) lotion Apply 1 application topically as needed for dry skin (arms).    . cloNIDine (CATAPRES) 0.3 MG tablet TAKE 1 TABLET(0.3 MG) BY MOUTH TWICE DAILY 180 tablet 0  . doxycycline (VIBRA-TABS) 100 MG tablet Take 1 tablet (100 mg total) by mouth 2 (two) times daily. 20 tablet 0  . FLUoxetine (PROZAC) 20 MG capsule TAKE 2 CAPSULES(40 MG) BY MOUTH DAILY 180 capsule 0  .  hydrOXYzine (ATARAX/VISTARIL) 10 MG tablet TAKE 1 TABLET(10 MG) BY MOUTH THREE TIMES DAILY AS NEEDED 90 tablet 3  . metoprolol succinate (TOPROL-XL) 100 MG 24 hr tablet TAKE 1 TABLET(100 MG) BY MOUTH DAILY WITH LUNCH 30 tablet 1  . ondansetron (ZOFRAN) 4 MG tablet TAKE 1 TABLET(4 MG) BY MOUTH EVERY 8 HOURS AS NEEDED FOR NAUSEA OR VOMITING 30 tablet 0  . pregabalin (LYRICA) 150 MG capsule TAKE 1 CAPSULE(150 MG) BY MOUTH TWICE DAILY 60 capsule 5  . triamcinolone cream (KENALOG) 0.1 % Apply 1 application topically 2 (two) times daily as needed (psoriasis).    Marland Kitchen zolpidem (AMBIEN) 10 MG tablet TAKE 1 AND 1/2 TABLETS(15 MG) BY MOUTH AT BEDTIME 45 tablet 2  . OLANZapine (ZYPREXA) 10 MG tablet 1/2 - 1 tablet daily as needed for agitation or anxiety 15 tablet 1   No current facility-administered medications for this visit.    Medication Side Effects: None  Allergies:  Allergies  Allergen Reactions  . Codeine Nausea Only    Past Medical History:  Diagnosis Date  . ADD (attention deficit disorder)   . Anemia    Microcytic  . Arthritis   . Bipolar disorder ([place])   . Depression   . Dyspnea    history of no current issues 05/26/2019  . GAD (generalized anxiety disorder)   . History of kidney stones    passed  . HTN (hypertension)   . PTSD (post-traumatic stress disorder)   . Skin cancer     Family History  Problem Relation Age of Onset  . Arthritis Other   . Heart disease Other   . Cancer Other   . Hypertension Other     Social History   Socioeconomic History  . Marital status: Married    Spouse name: Not on file  . Number of children: Not on file  . Years of education: Not on file  . Highest education level: Not on file  Occupational History  . Not on file  Tobacco Use  . Smoking status: Current Every Day Smoker    Packs/day: 1.00    Years: 40.00    Pack years: 40.00    Last attempt to quit: 04/25/2019    Years since quitting: 0.6  . Smokeless tobacco: Never Used   Substance and Sexual Activity  . Alcohol use: Yes    Comment: daily-6-7/day  . Drug use: Not Currently    Types: Marijuana  . Sexual activity: Not on file  Other Topics Concern  . Not on file  Social History Narrative  . Not on file   Social Determinants of Health   Financial Resource Strain:   . Difficulty of Paying Living Expenses:   Food Insecurity:   . Worried About Charity fundraiser in the Last Year:   . Arboriculturist in the Last Year:   Transportation Needs:   .  Lack of Transportation (Medical):   Marland Kitchen Lack of Transportation (Non-Medical):   Physical Activity:   . Days of Exercise per Week:   . Minutes of Exercise per Session:   Stress:   . Feeling of Stress :   Social Connections:   . Frequency of Communication with Friends and Family:   . Frequency of Social Gatherings with Friends and Family:   . Attends Religious Services:   . Active Member of Clubs or Organizations:   . Attends Archivist Meetings:   Marland Kitchen Marital Status:   Intimate Partner Violence:   . Fear of Current or Ex-Partner:   . Emotionally Abused:   Marland Kitchen Physically Abused:   . Sexually Abused:     Past Medical History, Surgical history, Social history, and Family history were reviewed and updated as appropriate.   Please see review of systems for further details on the patient's review from today.   Objective:   Physical Exam:  There were no vitals taken for this visit.  Physical Exam Constitutional:      Appearance: Normal appearance.  Neurological:     Mental Status: He is alert.     Motor: No tremor.     Gait: Gait normal.  Psychiatric:        Attention and Perception: He is inattentive.        Mood and Affect: Mood is anxious and depressed. Affect is labile. Affect is not tearful.        Speech: Speech is rapid and pressured.        Behavior: Behavior is agitated and hyperactive. Behavior is not slowed.        Thought Content: Thought content is not paranoid. Thought content  does not include homicidal or suicidal ideation.        Cognition and Memory: Cognition normal.     Comments: Fair insight and judgment. Chronically scattered.  More intense like his usual self.     Lab Review:     Component Value Date/Time   NA 132 (L) 12/26/2019 0928   K 4.0 12/26/2019 0928   CL 97 (L) 12/26/2019 0928   CO2 25 12/26/2019 0928   GLUCOSE 125 (H) 12/26/2019 0928   BUN 12 12/26/2019 0928   CREATININE 1.05 12/26/2019 0928   CREATININE TEST NOT PERFORMED 09/03/2012 1527   CALCIUM 8.8 (L) 12/26/2019 0928   PROT 7.1 12/26/2019 0928   ALBUMIN 3.7 12/26/2019 0928   AST 22 12/26/2019 0928   ALT 18 12/26/2019 0928   ALKPHOS 111 12/26/2019 0928   BILITOT 0.4 12/26/2019 0928   GFRNONAA >60 12/26/2019 0928   GFRNONAA TEST NOT PERFORMED 09/03/2012 1527   GFRAA >60 12/26/2019 0928   GFRAA TEST NOT PERFORMED 09/03/2012 1527       Component Value Date/Time   WBC 14.2 (H) 12/26/2019 0946   RBC 4.38 12/26/2019 0946   HGB 11.3 (L) 12/26/2019 0946   HCT 35.7 (L) 12/26/2019 0946   PLT 508 (H) 12/26/2019 0946   MCV 81.5 12/26/2019 0946   MCH 25.8 (L) 12/26/2019 0946   MCHC 31.7 12/26/2019 0946   RDW 17.0 (H) 12/26/2019 0946   LYMPHSABS 3.1 12/22/2019 2345   MONOABS 0.9 12/22/2019 2345   EOSABS 0.2 12/22/2019 2345   BASOSABS 0.1 12/22/2019 2345    No results found for: POCLITH, LITHIUM   No results found for: PHENYTOIN, PHENOBARB, VALPROATE, CBMZ   .res Assessment: Plan:    Zaylor was seen today for follow-up.  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 We  discussed that Ray has been chronically disabled by severe ADD and some depression and anxiety.  He is chronically scattered and distracted even on the stimulant at the high dosage and clonidine.  Apparently his wife and nephew have noticed him being more agitated since being on the steroids.  He is just coming off of prednisone given for rash.  He recognizes he is hyper compared to his normal baseline.  Because of acute agitation and suggested that he start olanzapine 5 to 10 mg nightly and take it for several days until he can feel himself calming down.  It is expected this steroid-induced mania will be brief as is typically the case.  If symptoms persist contact the physician.  If the medications not tolerated contact the physician.  Discussed potential metabolic side effects associated with atypical antipsychotics, as well as potential risk for movement side effects. Advised pt to contact office if movement side effects occur.   Supportive therapy with focus on self care and substance abuse focus.  Maintain sobriety. Supportive therapy re: dealing with mother who hasn't been able to get the vaccine.  Stressed dealing with that.   Problem solving and supportive thought therapy around getting wife help for her panic disorder.  Discussed potential benefits, risks, and side effects of stimulants with patient to include increased heart rate, palpitations, insomnia, increased anxiety, increased irritability, or decreased appetite.  Instructed patient to contact office if experiencing any significant tolerability issues.  Cont meds Prozac for depression and anxiety and Adderall for ADD and clonidine for anxiety and hypertension and Lyrica for chronic pain and off label for anxiety.  Also Ambien for sleep  He wants to try to quit smoking.  He has taken Chantix before but had nausea.  He asked for nausea medicine because it is very important for him to try to quit smoking.  He believes that he can take  Chantix and tolerated for 3 weeks he can quit smoking.  We will okay Phenergan.  Also discussed splitting the first dose of Chantix to reduce the nausea and taking a lower dose if necessary.  Also taking the evening dose at bedtime.  Discussed strategies for quitting smoking.   This appt was 30 mins.  Fu 3 mos  Lynder Parents, MD, DFAPA   Please see After Visit Summary for patient specific instructions.  Future Appointments  Date Time Provider [place]  01/24/2020  2:45 PM Cottle, Billey Co., MD [facility] None    No orders of the defined types were placed in this encounter.     -------------------------------

## 2020-01-07 ENCOUNTER — Other Ambulatory Visit: Payer: Self-pay | Admitting: Psychiatry

## 2020-01-12 ENCOUNTER — Other Ambulatory Visit: Payer: Self-pay | Admitting: Psychiatry

## 2020-01-24 ENCOUNTER — Encounter: Payer: PPO | Admitting: Psychiatry

## 2020-01-24 NOTE — Progress Notes (Signed)
This encounter was created in error - please disregard.

## 2020-01-29 ENCOUNTER — Other Ambulatory Visit: Payer: Self-pay | Admitting: Psychiatry

## 2020-02-06 ENCOUNTER — Other Ambulatory Visit: Payer: Self-pay | Admitting: Psychiatry

## 2020-02-14 DIAGNOSIS — L309 Dermatitis, unspecified: Secondary | ICD-10-CM | POA: Diagnosis not present

## 2020-02-14 DIAGNOSIS — L308 Other specified dermatitis: Secondary | ICD-10-CM | POA: Diagnosis not present

## 2020-02-14 DIAGNOSIS — L080 Pyoderma: Secondary | ICD-10-CM | POA: Diagnosis not present

## 2020-02-15 ENCOUNTER — Emergency Department (HOSPITAL_COMMUNITY): Payer: PPO

## 2020-02-15 ENCOUNTER — Other Ambulatory Visit: Payer: Self-pay | Admitting: Psychiatry

## 2020-02-15 ENCOUNTER — Emergency Department (HOSPITAL_COMMUNITY)
Admission: EM | Admit: 2020-02-15 | Discharge: 2020-02-15 | Disposition: A | Discharge status: Home / Self Care | Payer: PPO | Attending: Emergency Medicine | Admitting: Emergency Medicine

## 2020-02-15 DIAGNOSIS — R21 Rash and other nonspecific skin eruption: Secondary | ICD-10-CM | POA: Diagnosis present

## 2020-02-15 DIAGNOSIS — Z885 Allergy status to narcotic agent status: Secondary | ICD-10-CM | POA: Diagnosis not present

## 2020-02-15 DIAGNOSIS — Z79899 Other long term (current) drug therapy: Secondary | ICD-10-CM | POA: Insufficient documentation

## 2020-02-15 DIAGNOSIS — F17210 Nicotine dependence, cigarettes, uncomplicated: Secondary | ICD-10-CM | POA: Diagnosis not present

## 2020-02-15 DIAGNOSIS — R0902 Hypoxemia: Secondary | ICD-10-CM | POA: Diagnosis not present

## 2020-02-15 DIAGNOSIS — R0689 Other abnormalities of breathing: Secondary | ICD-10-CM | POA: Diagnosis not present

## 2020-02-15 DIAGNOSIS — R001 Bradycardia, unspecified: Secondary | ICD-10-CM | POA: Diagnosis not present

## 2020-02-15 DIAGNOSIS — Z20822 Contact with and (suspected) exposure to covid-19: Secondary | ICD-10-CM | POA: Diagnosis not present

## 2020-02-15 DIAGNOSIS — J9811 Atelectasis: Secondary | ICD-10-CM | POA: Diagnosis not present

## 2020-02-15 DIAGNOSIS — I10 Essential (primary) hypertension: Secondary | ICD-10-CM | POA: Insufficient documentation

## 2020-02-15 DIAGNOSIS — R410 Disorientation, unspecified: Secondary | ICD-10-CM | POA: Diagnosis not present

## 2020-02-15 DIAGNOSIS — R531 Weakness: Secondary | ICD-10-CM | POA: Diagnosis not present

## 2020-02-15 DIAGNOSIS — L299 Pruritus, unspecified: Secondary | ICD-10-CM | POA: Diagnosis not present

## 2020-02-15 LAB — COMPREHENSIVE METABOLIC PANEL
ALT: 14 U/L (ref 0–44)
AST: 21 U/L (ref 15–41)
Albumin: 3.2 g/dL — ABNORMAL LOW (ref 3.5–5.0)
Alkaline Phosphatase: 104 U/L (ref 38–126)
Anion gap: 10 (ref 5–15)
BUN: 18 mg/dL (ref 8–23)
CO2: 19 mmol/L — ABNORMAL LOW (ref 22–32)
Calcium: 9 mg/dL (ref 8.9–10.3)
Chloride: 104 mmol/L (ref 98–111)
Creatinine, Ser: 0.89 mg/dL (ref 0.61–1.24)
GFR calc Af Amer: >60 mL/min (ref >60)
GFR calc non Af Amer: >60 mL/min (ref >60)
Glucose, Bld: 144 mg/dL — ABNORMAL HIGH (ref 70–99)
Potassium: 4.6 mmol/L (ref 3.5–5.1)
Sodium: 133 mmol/L — ABNORMAL LOW (ref 135–145)
Total Bilirubin: 0.4 mg/dL (ref 0.3–1.2)
Total Protein: 7 g/dL (ref 6.5–8.1)

## 2020-02-15 LAB — CBC WITH DIFFERENTIAL/PLATELET
Abs Immature Granulocytes: 0.14 10*3/uL — ABNORMAL HIGH (ref 0.00–0.07)
Basophils Absolute: 0 10*3/uL (ref 0.0–0.1)
Basophils Relative: 0 %
Eosinophils Absolute: 0 10*3/uL (ref 0.0–0.5)
Eosinophils Relative: 0 %
HCT: 39.1 % (ref 39.0–52.0)
Hemoglobin: 12.2 g/dL — ABNORMAL LOW (ref 13.0–17.0)
Immature Granulocytes: 1 %
Lymphocytes Relative: 14 %
Lymphs Abs: 2.2 10*3/uL (ref 0.7–4.0)
MCH: 26.6 pg (ref 26.0–34.0)
MCHC: 31.2 g/dL (ref 30.0–36.0)
MCV: 85.2 fL (ref 80.0–100.0)
Monocytes Absolute: 1.2 10*3/uL — ABNORMAL HIGH (ref 0.1–1.0)
Monocytes Relative: 8 %
Neutro Abs: 11.8 10*3/uL — ABNORMAL HIGH (ref 1.7–7.7)
Neutrophils Relative %: 77 %
Platelets: 352 10*3/uL (ref 150–400)
RBC: 4.59 MIL/uL (ref 4.22–5.81)
RDW: 18.2 % — ABNORMAL HIGH (ref 11.5–15.5)
WBC: 15.3 10*3/uL — ABNORMAL HIGH (ref 4.0–10.5)
nRBC: 0 % (ref 0.0–0.2)

## 2020-02-15 LAB — URINALYSIS, ROUTINE W REFLEX MICROSCOPIC
Bilirubin Urine: NEGATIVE
Glucose, UA: NEGATIVE mg/dL
Hgb urine dipstick: NEGATIVE
Ketones, ur: 5 mg/dL — AB
Leukocytes,Ua: NEGATIVE
Nitrite: NEGATIVE
Protein, ur: NEGATIVE mg/dL
Specific Gravity, Urine: 1.015 (ref 1.005–1.030)
pH: 6 (ref 5.0–8.0)

## 2020-02-15 LAB — CBG MONITORING, ED: Glucose-Capillary: 150 mg/dL — ABNORMAL HIGH (ref 70–99)

## 2020-02-15 LAB — POC OCCULT BLOOD, ED: Fecal Occult Bld: NEGATIVE

## 2020-02-15 LAB — SARS CORONAVIRUS 2 BY RT PCR (HOSPITAL ORDER, PERFORMED IN ~~LOC~~ HOSPITAL LAB): SARS Coronavirus 2: NEGATIVE

## 2020-02-15 LAB — ETHANOL: Alcohol, Ethyl (B): <10 mg/dL (ref <10)

## 2020-02-15 MED ORDER — HYDROXYZINE HCL 25 MG PO TABS
25.0000 mg | ORAL_TABLET | Freq: Once | ORAL | Status: AC
  Administered 2020-02-15: 25 mg via ORAL
  Filled 2020-02-15: qty 1

## 2020-02-15 NOTE — ED Notes (Signed)
Pt ambulated without difficulty, then eloped.

## 2020-02-15 NOTE — ED Provider Notes (Signed)
[facility] Provider Note   CSN: [id number] Arrival date & time: 02/15/20  1416     History Chief Complaint  Patient presents with  . Rash    David [name] is a 64 y.o. male presenting for evaluation of weakness, confusion, and rash.  Patient states he has had the rash for a long time.  But today he was at the dermatology office and was found to be more confused.  He states he has been feeling weak over the past several days, has had to use his cane to walk.  He is currently being treated with prednisone for the rash, which has helped in the past, but has not been helping as much this time.  The rash was biopsied, but results are pending.  He denies fevers, chills, chest pain, shortness of breath, nausea, vomiting, abdominal pain, urinary symptoms, abnormal bowel movements.  He denies no other recent change in medications.  Additional history obtained from EMS.  EMS states patient was given 50 mg of Benadryl and 0.3 mg of epi IM for the rash.  HPI     Past Medical History:  Diagnosis Date  . ADD (attention deficit disorder)   . Anemia    Microcytic  . Arthritis   . Bipolar disorder ([place])   . Depression   . Dyspnea    history of no current issues 05/26/2019  . GAD (generalized anxiety disorder)   . History of kidney stones    passed  . HTN (hypertension)   . PTSD (post-traumatic stress disorder)   . Skin cancer     Patient Active Problem List   Diagnosis Date Noted  . Primary osteoarthritis of left hip 05/27/2019  . GAD (generalized anxiety disorder) 08/03/2018  . ADD (attention deficit disorder) 08/03/2018  . Closed right hip fracture, initial encounter ([place]) 06/23/2018  . HTN (hypertension) 06/23/2018  . Hypertensive urgency 06/23/2018  . Depression with anxiety 06/23/2018  . Microcytic anemia 06/23/2018  . Alcohol abuse with alcohol-induced mood disorder ([place]) 03/16/2018    Past Surgical History:  Procedure Laterality  Date  . BACK SURGERY     Fusion - lumbar  . HIP PINNING,CANNULATED Right 06/24/2018   Procedure: RIGHT CANNULATED HIP PINNING;  Surgeon: Erle Crocker, MD;  Location: [facility];  Service: Orthopedics;  Laterality: Right;  . LITHOTRIPSY    . MULTIPLE EXTRACTIONS WITH ALVEOLOPLASTY Bilateral 05/03/2019   Procedure: MULTIPLE EXTRACTION TEETH NUMBER TWO, THREE, FIVE, SIX, SEVEN, EIGHT, NINE, TEN, ELEVEN, FIFTEEN, SIXTEEN, SEVENTEEN, NINETEEN, TWENTY, TWENTY-ONE, TWENTY-TWO, TWENTY-THREE, TWENTY-FOUR, TWENTY-FIVE, TWENTY-SIX, TWENTY-SEVEN, TWENTY-EIGHT, TWENTY-NINE, THIRTY-TWO WITH ALVEOLOPLASTY;  Surgeon: Diona Browner, DDS;  Location: [facility];  Service: Oral Surgery;  Laterality: Bilateral;  . NOSE SURGERY     x3  . TOTAL HIP ARTHROPLASTY Left 05/27/2019   Procedure: TOTAL HIP ARTHROPLASTY ANTERIOR APPROACH;  Surgeon: Dorna Leitz, MD;  Location: [facility];  Service: Orthopedics;  Laterality: Left;       Family History  Problem Relation Age of Onset  . Arthritis Other   . Heart disease Other   . Cancer Other   . Hypertension Other     Social History   Tobacco Use  . Smoking status: Current Every Day Smoker    Packs/day: 1.00    Years: 40.00    Pack years: 40.00    Last attempt to quit: 04/25/2019    Years since quitting: 0.8  . Smokeless tobacco: Never Used  Substance Use Topics  . Alcohol use: Yes  Comment: daily-6-7/day  . Drug use: Not Currently    Types: Marijuana    Home Medications Prior to Admission medications   Medication Sig Start Date End Date Taking? Authorizing Provider  amphetamine-dextroamphetamine (ADDERALL) 30 MG tablet Take 1 tablet by mouth in the morning, 1 tablet at noon, and 1/2 tablet at 4PM Patient taking differently: Take 15-30 mg by mouth See admin instructions. Take 1 tablet by mouth in the morning, 1 tablet at noon, and 1/2 tablet at 4pm 10/25/19  Yes Cottle, Billey Co., MD  cefdinir (OMNICEF) 300 MG capsule Take 300 mg by mouth 2 (two) times daily.  Start date :02/14/20 02/14/20  Yes [provider]  cetaphil (CETAPHIL) lotion Apply 1 application topically as needed for dry skin.    Yes [provider]  cloNIDine (CATAPRES) 0.3 MG tablet TAKE 1 TABLET(0.3 MG) BY MOUTH TWICE DAILY Patient taking differently: Take 0.3 mg by mouth 2 (two) times daily.  01/30/20  Yes Cottle, Billey Co., MD  hydrOXYzine (ATARAX/VISTARIL) 10 MG tablet TAKE 1 TABLET(10 MG) BY MOUTH THREE TIMES DAILY AS NEEDED Patient taking differently: Take 10 mg by mouth in the morning, at noon, and at bedtime.  01/07/20  Yes Cottle, Billey Co., MD  metoprolol succinate (TOPROL-XL) 100 MG 24 hr tablet TAKE 1 TABLET(100 MG) BY MOUTH DAILY WITH LUNCH Patient taking differently: Take 100 mg by mouth daily.  02/06/20  Yes Cottle, Billey Co., MD  OLANZapine (ZYPREXA) 10 MG tablet 1/2 - 1 tablet daily as needed for agitation or anxiety Patient taking differently: Take 10 mg by mouth daily.  01/03/20  Yes Cottle, Billey Co., MD  ondansetron (ZOFRAN) 4 MG tablet TAKE 1 TABLET(4 MG) BY MOUTH EVERY 8 HOURS AS NEEDED FOR NAUSEA OR VOMITING Patient taking differently: Take 4 mg by mouth every 8 (eight) hours as needed for nausea or vomiting.  01/12/20  Yes Cottle, Billey Co., MD  predniSONE (DELTASONE) 20 MG tablet Take 20 mg by mouth See admin instructions. Take 8 tablets for 2 days, then 7 tablets for 2 days,then 6 tablets for 2 days, then 5 tablets once daily. Start date : 02/14/20.   Yes [provider]  pregabalin (LYRICA) 150 MG capsule TAKE 1 CAPSULE(150 MG) BY MOUTH TWICE DAILY Patient taking differently: Take 150 mg by mouth 2 (two) times daily. TAKE 1 CAPSULE(150 MG) BY MOUTH TWICE DAILY 08/26/19  Yes Cottle, Billey Co., MD  triamcinolone cream (KENALOG) 0.1 % Apply 1 application topically 2 (two) times daily as needed (psoriasis).   Yes [provider]  zolpidem (AMBIEN) 10 MG tablet TAKE 1 AND 1/2 TABLETS(15 MG) BY MOUTH AT BEDTIME Patient taking  differently: Take 10 mg by mouth daily.  12/14/19  Yes Cottle, Billey Co., MD  amphetamine-dextroamphetamine (ADDERALL) 30 MG tablet 1 in AM, 1 at noon, and 1/2 at 4PM Patient not taking: Reported on 02/15/2020 11/22/19   Cottle, Billey Co., MD  amphetamine-dextroamphetamine (ADDERALL) 30 MG tablet Take 1 tablet by mouth in the morning, 1 tablet at noon, and 1/2 tablet at 4PM Patient not taking: Reported on 02/15/2020 12/20/19   Cottle, Billey Co., MD  cephALEXin (KEFLEX) 500 MG capsule 2 caps po bid x 7 days Patient not taking: Reported on 02/15/2020 12/23/19   Mesner, Corene Cornea, MD  doxycycline (VIBRA-TABS) 100 MG tablet Take 1 tablet (100 mg total) by mouth 2 (two) times daily. Patient not taking: Reported on 02/15/2020 12/26/19   Dorie Rank,  MD  FLUoxetine (PROZAC) 20 MG capsule TAKE 2 CAPSULES(40 MG) BY MOUTH DAILY 02/15/20   Cottle, Billey Co., MD    Allergies    Codeine  Review of Systems   Review of Systems  Skin: Positive for rash.  Neurological: Positive for weakness.  Psychiatric/Behavioral: Positive for confusion.  All other systems reviewed and are negative.   Physical Exam Updated Vital Signs BP (!) 146/80   Pulse (!) 49   Temp (!) 96.6 F (35.9 C) (Rectal)   Resp 18   SpO2 100%   Physical Exam Vitals and nursing note reviewed.  Constitutional:      General: He is not in acute distress.    Appearance: He is well-developed.     Comments: Resting in the bed in no acute distress  HENT:     Head: Normocephalic and atraumatic.  Eyes:     Extraocular Movements: Extraocular movements intact.     Conjunctiva/sclera: Conjunctivae normal.     Pupils: Pupils are equal, round, and reactive to light.     Comments: Conjunctiva pale  Cardiovascular:     Rate and Rhythm: Normal rate and regular rhythm.     Pulses: Normal pulses.  Pulmonary:     Effort: Pulmonary effort is normal. No respiratory distress.     Breath sounds: Normal breath sounds. No wheezing.     Comments: Clear lung  sounds Abdominal:     General: There is no distension.     Palpations: Abdomen is soft. There is no mass.     Tenderness: There is no abdominal tenderness. There is no guarding or rebound.  Musculoskeletal:        General: Normal range of motion.     Cervical back: Normal range of motion and neck supple.  Skin:    General: Skin is warm and dry.     Capillary Refill: Capillary refill takes less than 2 seconds.     Coloration: Skin is pale.     Findings: Rash present.     Comments: Patient appears pale.  Scaly/plaque-like rash noted across the entire body.  Excoriated in parts.  Neurological:     Mental Status: He is alert and oriented to person, place, and time.     Comments: Alert and oriented.     ED Results / Procedures / Treatments   Labs (all labs ordered are listed, but only abnormal results are displayed) Labs Reviewed  CBC WITH DIFFERENTIAL/PLATELET - Abnormal; Notable for the following components:      Result Value   WBC 15.3 (*)    Hemoglobin 12.2 (*)    RDW 18.2 (*)    Neutro Abs 11.8 (*)    Monocytes Absolute 1.2 (*)    Abs Immature Granulocytes 0.14 (*)    All other components within normal limits  COMPREHENSIVE METABOLIC PANEL - Abnormal; Notable for the following components:   Sodium 133 (*)    CO2 19 (*)    Glucose, Bld 144 (*)    Albumin 3.2 (*)    All other components within normal limits  URINALYSIS, ROUTINE W REFLEX MICROSCOPIC - Abnormal; Notable for the following components:   Ketones, ur 5 (*)    All other components within normal limits  CBG MONITORING, ED - Abnormal; Notable for the following components:   Glucose-Capillary 150 (*)    All other components within normal limits  SARS CORONAVIRUS 2 BY RT PCR (HOSPITAL ORDER, [facility])  ETHANOL  POC OCCULT BLOOD,  ED    EKG EKG Interpretation  Date/Time:  Wednesday February 15 2020 14:24:33 EDT Ventricular Rate:  51 PR Interval:    QRS Duration: 97 QT  Interval:  458 QTC Calculation: 422 R Axis:   61 Text Interpretation: Sinus rhythm Nonspecific ST cahnges No STEMI Confirmed by Nanda Quinton [phone]) on 02/15/2020 3:07:40 PM   Radiology DG Chest 2 View  Result Date: 02/15/2020 CLINICAL DATA:  Pt c/o weakness and rash x 1 day. Hx of HTN. Pt is a current smoker. EXAM: CHEST - 2 VIEW COMPARISON:  Chest radiograph 12/22/2019, 08/28/2018 FINDINGS: Stable cardiomediastinal contours. Scattered bibasilar atelectasis. No new focal consolidation. No pneumothorax or pleural effusion. Degenerative changes in the thoracic spine. IMPRESSION: Scattered minimal bibasilar atelectasis. The no other acute finding. Electronically Signed   By: Audie Pinto M.D.   On: 02/15/2020 16:52    Procedures Procedures (including critical care time)  Medications Ordered in ED Medications  hydrOXYzine (ATARAX/VISTARIL) tablet 25 mg (25 mg Oral Given 02/15/20 1747)    ED Course  I have reviewed the triage vital signs and the nursing notes.  Pertinent labs & imaging results that were available during my care of the patient were reviewed by me and considered in my medical decision making (see chart for details).    MDM Rules/Calculators/A&P                      Presenting for evaluation of 4-day history of weakness with new confusion today.  Additionally, he has had a rash, but this is not new.  On exam, patient appears nontoxic.  He is alert and oriented on my exam.  He does appear pale, concern for possible anemia/bleed.  Also consider infectious cause for weakness and confusion.  Will obtain labs, CT head, chest x-ray, urine, and reassess.  Case discussed with attending, Dr. Laverta Baltimore evaluated the patient.  Labs interpreted by me, overall reassuring.  Mild leukocytosis of 15, this may be due to prednisone.  Hemoglobin stable at 12.  Electrolytes are stable.  Urine without infection.  Chest x-ray viewed interpreted by me, no obvious pneumonia or opacity.  EKG without  STEMI.  Will ambulate patient.  Patient ambulated without difficulty.  RN states patient is ready to leave.  Prior to my discussion with patient about disposition, patient eloped without informing staff.  Final Clinical Impression(s) / ED Diagnoses Final diagnoses:  Weakness    Rx / DC Orders ED Discharge Orders    None       Franchot Heidelberg, PA-C 02/15/20 2106    Margette Fast, MD 02/18/20 [phone]

## 2020-02-15 NOTE — ED Triage Notes (Signed)
Pt arrived via [facility] from dermatologist's office where pt was being treated for an existing rash. Rash found to be worse today. Pt is currently taking prednisone per derm. Wife reported to EMS that pt has had increased lethargy and weakness over the past 4 days. EMS gave 50mg  benadryl and 0.3mg  IM Epi PTA. EMS reported bradycardia @ 48. bp 144/80, cbg 193. Pt is alert and awakens to voice.

## 2020-02-16 ENCOUNTER — Other Ambulatory Visit: Payer: Self-pay | Admitting: Psychiatry

## 2020-02-16 ENCOUNTER — Telehealth: Payer: Self-pay | Admitting: Psychiatry

## 2020-02-16 DIAGNOSIS — F902 Attention-deficit hyperactivity disorder, combined type: Secondary | ICD-10-CM

## 2020-02-16 DIAGNOSIS — F308 Other manic episodes: Secondary | ICD-10-CM

## 2020-02-16 MED ORDER — AMPHETAMINE-DEXTROAMPHETAMINE 30 MG PO TABS: ORAL_TABLET | ORAL | 0 refills | Status: DC

## 2020-02-16 NOTE — Telephone Encounter (Signed)
Requesting a refill Adderall. Please send to [facility] on [address].

## 2020-02-22 DIAGNOSIS — L2084 Intrinsic (allergic) eczema: Secondary | ICD-10-CM | POA: Diagnosis not present

## 2020-03-05 ENCOUNTER — Other Ambulatory Visit: Payer: Self-pay

## 2020-03-05 MED ORDER — ZOLPIDEM TARTRATE 10 MG PO TABS: ORAL_TABLET | ORAL | 2 refills | Status: DC

## 2020-03-28 ENCOUNTER — Other Ambulatory Visit: Payer: Self-pay | Admitting: Psychiatry

## 2020-04-16 ENCOUNTER — Ambulatory Visit: Payer: PPO | Admitting: Psychiatry

## 2020-04-30 ENCOUNTER — Other Ambulatory Visit: Payer: Self-pay | Admitting: Psychiatry

## 2020-05-10 ENCOUNTER — Telehealth: Payer: Self-pay | Admitting: Psychiatry

## 2020-05-10 ENCOUNTER — Other Ambulatory Visit: Payer: Self-pay

## 2020-05-10 DIAGNOSIS — F902 Attention-deficit hyperactivity disorder, combined type: Secondary | ICD-10-CM

## 2020-05-10 MED ORDER — AMPHETAMINE-DEXTROAMPHETAMINE 30 MG PO TABS: ORAL_TABLET | ORAL | 0 refills | Status: DC

## 2020-05-10 NOTE — Telephone Encounter (Signed)
David [name] called requesting refill of his Adderall and Ambiew.  Appt 10/11.  Send to [facility] on [address]

## 2020-05-10 NOTE — Telephone Encounter (Signed)
Last refill 04/12/20 for Adderall and 04/08/20 for Ambien. Patient has 1 refill on file for Ambien.  Pended Adderall for Dr. [name] to send

## 2020-05-15 ENCOUNTER — Other Ambulatory Visit: Payer: Self-pay

## 2020-05-15 MED ORDER — PREGABALIN 150 MG PO CAPS: ORAL_CAPSULE | ORAL | 5 refills | Status: DC

## 2020-05-24 ENCOUNTER — Encounter (HOSPITAL_COMMUNITY): Payer: Self-pay

## 2020-05-24 ENCOUNTER — Emergency Department (HOSPITAL_COMMUNITY): Payer: PPO

## 2020-05-24 ENCOUNTER — Inpatient Hospital Stay (HOSPITAL_COMMUNITY): Payer: PPO

## 2020-05-24 ENCOUNTER — Inpatient Hospital Stay (HOSPITAL_COMMUNITY)
Admission: EM | Admit: 2020-05-24 | Discharge: 2020-05-27 | DRG: 304 | Disposition: A | Discharge status: Home / Self Care | Payer: PPO | Attending: Family Medicine | Admitting: Family Medicine

## 2020-05-24 DIAGNOSIS — R062 Wheezing: Secondary | ICD-10-CM | POA: Diagnosis not present

## 2020-05-24 DIAGNOSIS — F1010 Alcohol abuse, uncomplicated: Secondary | ICD-10-CM | POA: Diagnosis present

## 2020-05-24 DIAGNOSIS — I161 Hypertensive emergency: Secondary | ICD-10-CM | POA: Diagnosis not present

## 2020-05-24 DIAGNOSIS — F319 Bipolar disorder, unspecified: Secondary | ICD-10-CM | POA: Diagnosis not present

## 2020-05-24 DIAGNOSIS — Z9114 Patient's other noncompliance with medication regimen: Secondary | ICD-10-CM

## 2020-05-24 DIAGNOSIS — R000 Tachycardia, unspecified: Secondary | ICD-10-CM | POA: Diagnosis not present

## 2020-05-24 DIAGNOSIS — Z96642 Presence of left artificial hip joint: Secondary | ICD-10-CM | POA: Diagnosis present

## 2020-05-24 DIAGNOSIS — I10 Essential (primary) hypertension: Secondary | ICD-10-CM | POA: Diagnosis not present

## 2020-05-24 DIAGNOSIS — T461X5A Adverse effect of calcium-channel blockers, initial encounter: Secondary | ICD-10-CM | POA: Diagnosis not present

## 2020-05-24 DIAGNOSIS — T447X5A Adverse effect of beta-adrenoreceptor antagonists, initial encounter: Secondary | ICD-10-CM | POA: Diagnosis not present

## 2020-05-24 DIAGNOSIS — F17210 Nicotine dependence, cigarettes, uncomplicated: Secondary | ICD-10-CM | POA: Diagnosis not present

## 2020-05-24 DIAGNOSIS — I159 Secondary hypertension, unspecified: Secondary | ICD-10-CM | POA: Diagnosis present

## 2020-05-24 DIAGNOSIS — F411 Generalized anxiety disorder: Secondary | ICD-10-CM | POA: Diagnosis present

## 2020-05-24 DIAGNOSIS — G9341 Metabolic encephalopathy: Secondary | ICD-10-CM | POA: Diagnosis not present

## 2020-05-24 DIAGNOSIS — R001 Bradycardia, unspecified: Secondary | ICD-10-CM | POA: Diagnosis not present

## 2020-05-24 DIAGNOSIS — I213 ST elevation (STEMI) myocardial infarction of unspecified site: Secondary | ICD-10-CM | POA: Diagnosis not present

## 2020-05-24 DIAGNOSIS — Z981 Arthrodesis status: Secondary | ICD-10-CM | POA: Diagnosis not present

## 2020-05-24 DIAGNOSIS — Z8249 Family history of ischemic heart disease and other diseases of the circulatory system: Secondary | ICD-10-CM

## 2020-05-24 DIAGNOSIS — R569 Unspecified convulsions: Secondary | ICD-10-CM

## 2020-05-24 DIAGNOSIS — Z79899 Other long term (current) drug therapy: Secondary | ICD-10-CM | POA: Diagnosis not present

## 2020-05-24 DIAGNOSIS — F1910 Other psychoactive substance abuse, uncomplicated: Secondary | ICD-10-CM | POA: Diagnosis not present

## 2020-05-24 DIAGNOSIS — R410 Disorientation, unspecified: Secondary | ICD-10-CM | POA: Diagnosis not present

## 2020-05-24 DIAGNOSIS — J9811 Atelectasis: Secondary | ICD-10-CM | POA: Diagnosis not present

## 2020-05-24 DIAGNOSIS — G9340 Encephalopathy, unspecified: Secondary | ICD-10-CM | POA: Diagnosis not present

## 2020-05-24 DIAGNOSIS — F988 Other specified behavioral and emotional disorders with onset usually occurring in childhood and adolescence: Secondary | ICD-10-CM | POA: Diagnosis present

## 2020-05-24 DIAGNOSIS — Z20822 Contact with and (suspected) exposure to covid-19: Secondary | ICD-10-CM | POA: Diagnosis present

## 2020-05-24 DIAGNOSIS — M1990 Unspecified osteoarthritis, unspecified site: Secondary | ICD-10-CM | POA: Diagnosis not present

## 2020-05-24 DIAGNOSIS — Z85828 Personal history of other malignant neoplasm of skin: Secondary | ICD-10-CM

## 2020-05-24 DIAGNOSIS — Y92239 Unspecified place in hospital as the place of occurrence of the external cause: Secondary | ICD-10-CM | POA: Diagnosis not present

## 2020-05-24 DIAGNOSIS — F4310 Post-traumatic stress disorder, unspecified: Secondary | ICD-10-CM | POA: Diagnosis not present

## 2020-05-24 DIAGNOSIS — R0602 Shortness of breath: Secondary | ICD-10-CM

## 2020-05-24 DIAGNOSIS — I5033 Acute on chronic diastolic (congestive) heart failure: Secondary | ICD-10-CM | POA: Diagnosis not present

## 2020-05-24 DIAGNOSIS — Z87442 Personal history of urinary calculi: Secondary | ICD-10-CM | POA: Diagnosis not present

## 2020-05-24 DIAGNOSIS — I959 Hypotension, unspecified: Secondary | ICD-10-CM | POA: Diagnosis not present

## 2020-05-24 DIAGNOSIS — Z885 Allergy status to narcotic agent status: Secondary | ICD-10-CM | POA: Diagnosis not present

## 2020-05-24 DIAGNOSIS — R4182 Altered mental status, unspecified: Secondary | ICD-10-CM | POA: Diagnosis not present

## 2020-05-24 DIAGNOSIS — I509 Heart failure, unspecified: Secondary | ICD-10-CM | POA: Diagnosis not present

## 2020-05-24 DIAGNOSIS — I5031 Acute diastolic (congestive) heart failure: Secondary | ICD-10-CM | POA: Diagnosis not present

## 2020-05-24 DIAGNOSIS — I160 Hypertensive urgency: Secondary | ICD-10-CM | POA: Diagnosis present

## 2020-05-24 DIAGNOSIS — R4781 Slurred speech: Secondary | ICD-10-CM | POA: Diagnosis not present

## 2020-05-24 LAB — I-STAT ARTERIAL BLOOD GAS, ED
Acid-Base Excess: 0 mmol/L (ref 0.0–2.0)
Bicarbonate: 24.7 mmol/L (ref 20.0–28.0)
Calcium, Ion: 1.26 mmol/L (ref 1.15–1.40)
HCT: 43 % (ref 39.0–52.0)
Hemoglobin: 14.6 g/dL (ref 13.0–17.0)
O2 Saturation: 95 %
Patient temperature: 98.6
Potassium: 3.4 mmol/L — ABNORMAL LOW (ref 3.5–5.1)
Sodium: 135 mmol/L (ref 135–145)
TCO2: 26 mmol/L (ref 22–32)
pCO2 arterial: 41.2 mmHg (ref 32.0–48.0)
pH, Arterial: 7.386 (ref 7.350–7.450)
pO2, Arterial: 79 mmHg — ABNORMAL LOW (ref 83.0–108.0)

## 2020-05-24 LAB — CBC WITH DIFFERENTIAL/PLATELET
Abs Immature Granulocytes: 0.1 10*3/uL — ABNORMAL HIGH (ref 0.00–0.07)
Basophils Absolute: 0.1 10*3/uL (ref 0.0–0.1)
Basophils Relative: 1 %
Eosinophils Absolute: 0.1 10*3/uL (ref 0.0–0.5)
Eosinophils Relative: 1 %
HCT: 43.8 % (ref 39.0–52.0)
Hemoglobin: 14.1 g/dL (ref 13.0–17.0)
Immature Granulocytes: 1 %
Lymphocytes Relative: 16 %
Lymphs Abs: 2.2 10*3/uL (ref 0.7–4.0)
MCH: 28.7 pg (ref 26.0–34.0)
MCHC: 32.2 g/dL (ref 30.0–36.0)
MCV: 89 fL (ref 80.0–100.0)
Monocytes Absolute: 0.7 10*3/uL (ref 0.1–1.0)
Monocytes Relative: 5 %
Neutro Abs: 11 10*3/uL — ABNORMAL HIGH (ref 1.7–7.7)
Neutrophils Relative %: 76 %
Platelets: 364 10*3/uL (ref 150–400)
RBC: 4.92 MIL/uL (ref 4.22–5.81)
RDW: 15.3 % (ref 11.5–15.5)
WBC: 14.1 10*3/uL — ABNORMAL HIGH (ref 4.0–10.5)
nRBC: 0 % (ref 0.0–0.2)

## 2020-05-24 LAB — I-STAT CHEM 8, ED
BUN: 12 mg/dL (ref 8–23)
Calcium, Ion: 1.17 mmol/L (ref 1.15–1.40)
Chloride: 99 mmol/L (ref 98–111)
Creatinine, Ser: 1.1 mg/dL (ref 0.61–1.24)
Glucose, Bld: 160 mg/dL — ABNORMAL HIGH (ref 70–99)
HCT: 44 % (ref 39.0–52.0)
Hemoglobin: 15 g/dL (ref 13.0–17.0)
Potassium: 3.4 mmol/L — ABNORMAL LOW (ref 3.5–5.1)
Sodium: 135 mmol/L (ref 135–145)
TCO2: 24 mmol/L (ref 22–32)

## 2020-05-24 LAB — BRAIN NATRIURETIC PEPTIDE: B Natriuretic Peptide: 608.4 pg/mL — ABNORMAL HIGH (ref 0.0–100.0)

## 2020-05-24 LAB — COMPREHENSIVE METABOLIC PANEL
ALT: 20 U/L (ref 0–44)
ALT: 18 U/L (ref 0–44)
AST: 32 U/L (ref 15–41)
AST: 30 U/L (ref 15–41)
Albumin: 3.5 g/dL (ref 3.5–5.0)
Albumin: 3.7 g/dL (ref 3.5–5.0)
Alkaline Phosphatase: 101 U/L (ref 38–126)
Alkaline Phosphatase: 99 U/L (ref 38–126)
Anion gap: 12 (ref 5–15)
Anion gap: 15 (ref 5–15)
BUN: 11 mg/dL (ref 8–23)
BUN: 11 mg/dL (ref 8–23)
CO2: 23 mmol/L (ref 22–32)
CO2: 19 mmol/L — ABNORMAL LOW (ref 22–32)
Calcium: 9.5 mg/dL (ref 8.9–10.3)
Calcium: 9.4 mg/dL (ref 8.9–10.3)
Chloride: 99 mmol/L (ref 98–111)
Chloride: 102 mmol/L (ref 98–111)
Creatinine, Ser: 1.13 mg/dL (ref 0.61–1.24)
Creatinine, Ser: 1.1 mg/dL (ref 0.61–1.24)
GFR calc Af Amer: >60 mL/min (ref >60)
GFR calc Af Amer: >60 mL/min (ref >60)
GFR calc non Af Amer: >60 mL/min (ref >60)
GFR calc non Af Amer: >60 mL/min (ref >60)
Glucose, Bld: 164 mg/dL — ABNORMAL HIGH (ref 70–99)
Glucose, Bld: 195 mg/dL — ABNORMAL HIGH (ref 70–99)
Potassium: 3.8 mmol/L (ref 3.5–5.1)
Potassium: 3.4 mmol/L — ABNORMAL LOW (ref 3.5–5.1)
Sodium: 134 mmol/L — ABNORMAL LOW (ref 135–145)
Sodium: 136 mmol/L (ref 135–145)
Total Bilirubin: 0.8 mg/dL (ref 0.3–1.2)
Total Bilirubin: 0.4 mg/dL (ref 0.3–1.2)
Total Protein: 6.6 g/dL (ref 6.5–8.1)
Total Protein: 7.2 g/dL (ref 6.5–8.1)

## 2020-05-24 LAB — I-STAT VENOUS BLOOD GAS, ED
Acid-Base Excess: 0 mmol/L (ref 0.0–2.0)
Bicarbonate: 25.8 mmol/L (ref 20.0–28.0)
Calcium, Ion: 1.19 mmol/L (ref 1.15–1.40)
HCT: 44 % (ref 39.0–52.0)
Hemoglobin: 15 g/dL (ref 13.0–17.0)
O2 Saturation: 78 %
Potassium: 3.3 mmol/L — ABNORMAL LOW (ref 3.5–5.1)
Sodium: 135 mmol/L (ref 135–145)
TCO2: 27 mmol/L (ref 22–32)
pCO2, Ven: 44.8 mmHg (ref 44.0–60.0)
pH, Ven: 7.369 (ref 7.250–7.430)
pO2, Ven: 44 mmHg (ref 32.0–45.0)

## 2020-05-24 LAB — CBC
HCT: 42.6 % (ref 39.0–52.0)
Hemoglobin: 14.2 g/dL (ref 13.0–17.0)
MCH: 28.8 pg (ref 26.0–34.0)
MCHC: 33.3 g/dL (ref 30.0–36.0)
MCV: 86.4 fL (ref 80.0–100.0)
Platelets: 345 10*3/uL (ref 150–400)
RBC: 4.93 MIL/uL (ref 4.22–5.81)
RDW: 15.1 % (ref 11.5–15.5)
WBC: 14.3 10*3/uL — ABNORMAL HIGH (ref 4.0–10.5)
nRBC: 0 % (ref 0.0–0.2)

## 2020-05-24 LAB — RAPID URINE DRUG SCREEN, HOSP PERFORMED
Amphetamines: POSITIVE — AB
Barbiturates: NOT DETECTED
Benzodiazepines: NOT DETECTED
Cocaine: NOT DETECTED
Opiates: NOT DETECTED
Tetrahydrocannabinol: POSITIVE — AB

## 2020-05-24 LAB — URINALYSIS, ROUTINE W REFLEX MICROSCOPIC
Bilirubin Urine: NEGATIVE
Glucose, UA: NEGATIVE mg/dL
Hgb urine dipstick: NEGATIVE
Ketones, ur: NEGATIVE mg/dL
Leukocytes,Ua: NEGATIVE
Nitrite: NEGATIVE
Protein, ur: NEGATIVE mg/dL
Specific Gravity, Urine: 1.005 (ref 1.005–1.030)
pH: 6 (ref 5.0–8.0)

## 2020-05-24 LAB — TROPONIN I (HIGH SENSITIVITY)
Troponin I (High Sensitivity): 9 ng/L (ref <18)
Troponin I (High Sensitivity): 10 ng/L (ref <18)

## 2020-05-24 LAB — LACTIC ACID, PLASMA: Lactic Acid, Venous: 2.6 mmol/L (ref 0.5–1.9)

## 2020-05-24 LAB — SARS CORONAVIRUS 2 BY RT PCR (HOSPITAL ORDER, PERFORMED IN ~~LOC~~ HOSPITAL LAB): SARS Coronavirus 2: NEGATIVE

## 2020-05-24 LAB — PHOSPHORUS: Phosphorus: 3.3 mg/dL (ref 2.5–4.6)

## 2020-05-24 LAB — LIPASE, BLOOD: Lipase: 25 U/L (ref 11–51)

## 2020-05-24 LAB — ACETAMINOPHEN LEVEL: Acetaminophen (Tylenol), Serum: <10 ug/mL — ABNORMAL LOW (ref 10–30)

## 2020-05-24 LAB — SALICYLATE LEVEL: Salicylate Lvl: <7 mg/dL — ABNORMAL LOW (ref 7.0–30.0)

## 2020-05-24 LAB — PROTIME-INR
INR: 0.9 (ref 0.8–1.2)
Prothrombin Time: 12.2 seconds (ref 11.4–15.2)

## 2020-05-24 LAB — AMMONIA: Ammonia: 26 umol/L (ref 9–35)

## 2020-05-24 LAB — MAGNESIUM: Magnesium: 1.8 mg/dL (ref 1.7–2.4)

## 2020-05-24 LAB — ETHANOL: Alcohol, Ethyl (B): <10 mg/dL (ref <10)

## 2020-05-24 LAB — HIV ANTIBODY (ROUTINE TESTING W REFLEX): HIV Screen 4th Generation wRfx: NONREACTIVE

## 2020-05-24 MED ORDER — LEVOCETIRIZINE DIHYDROCHLORIDE 5 MG PO TABS: 5.0000 mg | ORAL_TABLET | Freq: Every evening | ORAL | Status: DC

## 2020-05-24 MED ORDER — LORAZEPAM 2 MG/ML IJ SOLN
2.0000 mg | Freq: Once | INTRAMUSCULAR | Status: DC
  Filled 2020-05-24: qty 1

## 2020-05-24 MED ORDER — ONDANSETRON HCL 4 MG/2ML IJ SOLN
4.0000 mg | Freq: Four times a day (QID) | INTRAMUSCULAR | Status: DC | PRN
  Administered 2020-05-26: 4 mg via INTRAVENOUS
  Filled 2020-05-24: qty 2

## 2020-05-24 MED ORDER — HALOPERIDOL LACTATE 5 MG/ML IJ SOLN
5.0000 mg | Freq: Once | INTRAMUSCULAR | Status: AC
  Administered 2020-05-24: 5 mg via INTRAVENOUS
  Filled 2020-05-24: qty 1

## 2020-05-24 MED ORDER — FOLIC ACID 1 MG PO TABS
1.0000 mg | ORAL_TABLET | Freq: Every day | ORAL | Status: DC
  Administered 2020-05-25 – 2020-05-27 (×3): 1 mg via ORAL
  Filled 2020-05-24 (×3): qty 1

## 2020-05-24 MED ORDER — THIAMINE HCL 100 MG PO TABS
100.0000 mg | ORAL_TABLET | Freq: Every day | ORAL | Status: DC
  Administered 2020-05-25 – 2020-05-27 (×3): 100 mg via ORAL
  Filled 2020-05-24 (×3): qty 1

## 2020-05-24 MED ORDER — THIAMINE HCL 100 MG/ML IJ SOLN: 100.0000 mg | Freq: Every day | INTRAMUSCULAR | Status: DC

## 2020-05-24 MED ORDER — HYDRALAZINE HCL 20 MG/ML IJ SOLN
20.0000 mg | Freq: Once | INTRAMUSCULAR | Status: AC
  Administered 2020-05-24: 20 mg via INTRAVENOUS
  Filled 2020-05-24: qty 1

## 2020-05-24 MED ORDER — SODIUM CHLORIDE 0.9 % IV SOLN: 250.0000 mL | INTRAVENOUS | Status: DC | PRN

## 2020-05-24 MED ORDER — LORAZEPAM 2 MG/ML IJ SOLN
1.0000 mg | INTRAMUSCULAR | Status: DC | PRN
  Administered 2020-05-24: 4 mg via INTRAVENOUS
  Administered 2020-05-24: 1 mg via INTRAVENOUS
  Administered 2020-05-25 (×2): 2 mg via INTRAVENOUS
  Filled 2020-05-24 (×3): qty 1
  Filled 2020-05-24: qty 2

## 2020-05-24 MED ORDER — HYDRALAZINE HCL 25 MG PO TABS: 25.0000 mg | ORAL_TABLET | Freq: Four times a day (QID) | ORAL | Status: DC | PRN

## 2020-05-24 MED ORDER — HYDROXYZINE HCL 10 MG PO TABS
10.0000 mg | ORAL_TABLET | Freq: Three times a day (TID) | ORAL | Status: DC | PRN
  Filled 2020-05-24: qty 1

## 2020-05-24 MED ORDER — LORAZEPAM 1 MG PO TABS: 1.0000 mg | ORAL_TABLET | ORAL | Status: DC | PRN

## 2020-05-24 MED ORDER — LORATADINE 10 MG PO TABS
10.0000 mg | ORAL_TABLET | Freq: Every day | ORAL | Status: DC
  Administered 2020-05-24 – 2020-05-26 (×3): 10 mg via ORAL
  Filled 2020-05-24 (×3): qty 1

## 2020-05-24 MED ORDER — TRIAMCINOLONE ACETONIDE 0.1 % EX OINT: 1.0000 "application " | TOPICAL_OINTMENT | Freq: Two times a day (BID) | CUTANEOUS | Status: DC

## 2020-05-24 MED ORDER — AMLODIPINE BESYLATE 5 MG PO TABS
5.0000 mg | ORAL_TABLET | Freq: Every day | ORAL | Status: DC
  Administered 2020-05-25 – 2020-05-26 (×2): 5 mg via ORAL
  Filled 2020-05-24 (×2): qty 1

## 2020-05-24 MED ORDER — OLANZAPINE 5 MG PO TABS
5.0000 mg | ORAL_TABLET | Freq: Every day | ORAL | Status: DC | PRN
  Administered 2020-05-26: 5 mg via ORAL
  Filled 2020-05-24 (×3): qty 1

## 2020-05-24 MED ORDER — ADULT MULTIVITAMIN W/MINERALS CH
1.0000 | ORAL_TABLET | Freq: Every day | ORAL | Status: DC
  Administered 2020-05-25 – 2020-05-27 (×3): 1 via ORAL
  Filled 2020-05-24 (×3): qty 1

## 2020-05-24 MED ORDER — ASPIRIN EC 81 MG PO TBEC
81.0000 mg | DELAYED_RELEASE_TABLET | Freq: Every day | ORAL | Status: DC
  Administered 2020-05-25 – 2020-05-27 (×3): 81 mg via ORAL
  Filled 2020-05-24 (×3): qty 1

## 2020-05-24 MED ORDER — CARVEDILOL 3.125 MG PO TABS
3.1250 mg | ORAL_TABLET | Freq: Two times a day (BID) | ORAL | Status: DC
  Administered 2020-05-24 – 2020-05-25 (×2): 3.125 mg via ORAL
  Filled 2020-05-24 (×2): qty 1

## 2020-05-24 MED ORDER — HALOPERIDOL LACTATE 5 MG/ML IJ SOLN
2.0000 mg | Freq: Once | INTRAMUSCULAR | Status: AC
  Administered 2020-05-24: 2 mg via INTRAVENOUS
  Filled 2020-05-24: qty 1

## 2020-05-24 MED ORDER — SODIUM CHLORIDE 0.9% FLUSH
3.0000 mL | Freq: Two times a day (BID) | INTRAVENOUS | Status: DC
  Administered 2020-05-24 – 2020-05-27 (×7): 3 mL via INTRAVENOUS

## 2020-05-24 MED ORDER — HYDRALAZINE HCL 20 MG/ML IJ SOLN
10.0000 mg | INTRAMUSCULAR | Status: DC | PRN
  Administered 2020-05-25: 10 mg via INTRAVENOUS
  Filled 2020-05-24: qty 1

## 2020-05-24 MED ORDER — FUROSEMIDE 10 MG/ML IJ SOLN
20.0000 mg | Freq: Once | INTRAMUSCULAR | Status: DC
  Filled 2020-05-24: qty 2

## 2020-05-24 MED ORDER — AMPHETAMINE-DEXTROAMPHETAMINE 30 MG PO TABS: 15.0000 mg | ORAL_TABLET | ORAL | Status: DC

## 2020-05-24 MED ORDER — ACETAMINOPHEN 325 MG PO TABS: 650.0000 mg | ORAL_TABLET | ORAL | Status: DC | PRN

## 2020-05-24 MED ORDER — ZOLPIDEM TARTRATE 5 MG PO TABS
10.0000 mg | ORAL_TABLET | Freq: Every day | ORAL | Status: DC
  Administered 2020-05-24 – 2020-05-26 (×3): 10 mg via ORAL
  Filled 2020-05-24 (×3): qty 2

## 2020-05-24 MED ORDER — ENOXAPARIN SODIUM 40 MG/0.4ML ~~LOC~~ SOLN
40.0000 mg | SUBCUTANEOUS | Status: DC
  Administered 2020-05-24 – 2020-05-26 (×3): 40 mg via SUBCUTANEOUS
  Filled 2020-05-24 (×3): qty 0.4

## 2020-05-24 MED ORDER — FLUOXETINE HCL 20 MG PO CAPS
40.0000 mg | ORAL_CAPSULE | Freq: Every day | ORAL | Status: DC
  Administered 2020-05-25 – 2020-05-27 (×3): 40 mg via ORAL
  Filled 2020-05-24 (×3): qty 2

## 2020-05-24 MED ORDER — CETAPHIL MOISTURIZING EX LOTN
1.0000 "application " | TOPICAL_LOTION | CUTANEOUS | Status: DC | PRN
  Filled 2020-05-24: qty 473

## 2020-05-24 MED ORDER — LORAZEPAM 2 MG/ML IJ SOLN
1.0000 mg | Freq: Once | INTRAMUSCULAR | Status: AC
  Administered 2020-05-24: 1 mg via INTRAVENOUS

## 2020-05-24 MED ORDER — SODIUM CHLORIDE 0.9% FLUSH: 3.0000 mL | INTRAVENOUS | Status: DC | PRN

## 2020-05-24 MED ORDER — LORAZEPAM 2 MG/ML IJ SOLN
2.0000 mg | Freq: Once | INTRAMUSCULAR | Status: AC
  Administered 2020-05-24: 2 mg via INTRAVENOUS
  Filled 2020-05-24: qty 1

## 2020-05-24 MED ORDER — ONDANSETRON HCL 4 MG PO TABS: 4.0000 mg | ORAL_TABLET | Freq: Three times a day (TID) | ORAL | Status: DC | PRN

## 2020-05-24 MED ORDER — LISINOPRIL 10 MG PO TABS
10.0000 mg | ORAL_TABLET | Freq: Every day | ORAL | Status: DC
  Administered 2020-05-25 – 2020-05-26 (×2): 10 mg via ORAL
  Filled 2020-05-24 (×3): qty 1

## 2020-05-24 NOTE — ED Notes (Signed)
Attempted report to [facility] RN

## 2020-05-24 NOTE — Consult Note (Addendum)
Neurology Consultation  Reason for Consult: Altered mental status in the setting of hypertensive urgency Referring Physician: Ezzie Dural, MD - EDP  CC: Altered mental status  History is obtained from: Wife  HPI: David [name] is a 64 y.o. male with history of PTSD, hypertension, GAD, depression, bipolar disease and ADD.  Per wife she woke up at approximately 7:00 in the morning.  At that time patient was asleep.  She went for 40-minute walk and when returned home found her husband yelling to the neighbor that he was short of breath.  At that time patient's wife called EMS, and blood pressure was found to be systolically [id number].  [facility] was brought [facility].  While in the ED he was very confused and altered.  He was given blood pressure medications to lower his blood pressure.  CT scan was obtained which did not show any bleed and read as no acute intracranial abnormalities.  After ED MD had seen the patient she was concern for PRES and altered mental status thus consulted neurology.  During the consultation wife did note that as his blood pressure had been lowered he had become more alert and oriented.  He was no longer agitated.  That said he had also been given 5 mg of Haldol secondary to agitation prior to me entering the room.  Patient denied any blurred vision, headache, numbness or tingling but did admit that he was slightly confused and feels better.  He admits to drinking 5 beers a day throughout the day however he states he did not stop drinking at any point in time.  He denies taking any illicit drugs.   Past Medical History:  Diagnosis Date  . ADD (attention deficit disorder)   . Anemia    Microcytic  . Arthritis   . Bipolar disorder ([place])   . Depression   . Dyspnea    history of no current issues 05/26/2019  . GAD (generalized anxiety disorder)   . History of kidney stones    passed  . HTN (hypertension)   . PTSD (post-traumatic stress disorder)   . Skin  cancer     Family History  Problem Relation Age of Onset  . Arthritis Other   . Heart disease Other   . Cancer Other   . Hypertension Other    Social History:   reports that he has been smoking. He has a 40.00 pack-year smoking history. He has never used smokeless tobacco. He reports current alcohol use. He reports previous drug use. Drug: Marijuana.  Medications  Current Facility-Administered Medications:  .  0.9 %  sodium chloride infusion, 250 mL, Intravenous, PRN, Wynetta Fines T, MD .  acetaminophen (TYLENOL) tablet 650 mg, 650 mg, Oral, Q4H PRN, Wynetta Fines T, MD .  amLODipine (NORVASC) tablet 5 mg, 5 mg, Oral, Daily, Zhang, Ping T, MD .  amphetamine-dextroamphetamine (ADDERALL) tablet 0.5-1 tablet, 15-30 mg, Oral, See admin instructions, Wynetta Fines T, MD .  aspirin EC tablet 81 mg, 81 mg, Oral, Daily, Zhang, Ping T, MD .  carvedilol (COREG) tablet 3.125 mg, 3.125 mg, Oral, BID WC, Zhang, Pearletha Forge T, MD .  cetaphil lotion 1 application, 1 application, Topical, PRN, Wynetta Fines T, MD .  enoxaparin (LOVENOX) injection 40 mg, 40 mg, Subcutaneous, Q24H, Zhang, Ping T, MD .  FLUoxetine (PROZAC) capsule 40 mg, 40 mg, Oral, Daily, Wynetta Fines T, MD .  folic acid (FOLVITE) tablet 1 mg, 1 mg, Oral, Daily, Lequita Halt, MD .  furosemide (LASIX) injection 20 mg, 20 mg, Intravenous, Once, Wynetta Fines T, MD .  hydrALAZINE (APRESOLINE) tablet 25 mg, 25 mg, Oral, Q6H PRN, Wynetta Fines T, MD .  hydrOXYzine (ATARAX/VISTARIL) tablet 10 mg, 10 mg, Oral, TID PRN, Wynetta Fines T, MD .  lisinopril (ZESTRIL) tablet 10 mg, 10 mg, Oral, Daily, Wynetta Fines T, MD .  loratadine (CLARITIN) tablet 10 mg, 10 mg, Oral, QHS, Zhang, Ping T, MD .  LORazepam (ATIVAN) tablet 1-4 mg, 1-4 mg, Oral, Q1H PRN **OR** LORazepam (ATIVAN) injection 1-4 mg, 1-4 mg, Intravenous, Q1H PRN, Wynetta Fines T, MD .  LORazepam (ATIVAN) injection 2 mg, 2 mg, Intravenous, Once, Charlesetta Shanks, MD .  multivitamin with minerals tablet 1  tablet, 1 tablet, Oral, Daily, Zhang, Pearletha Forge T, MD .  OLANZapine (ZYPREXA) tablet 5 mg, 5 mg, Oral, Daily PRN, Wynetta Fines T, MD .  ondansetron [facility]) injection 4 mg, 4 mg, Intravenous, Q6H PRN, Wynetta Fines T, MD .  ondansetron [facility]) tablet 4 mg, 4 mg, Oral, Q8H PRN, Wynetta Fines T, MD .  sodium chloride flush (NS) 0.9 % injection 3 mL, 3 mL, Intravenous, Q12H, Zhang, Ping T, MD .  sodium chloride flush (NS) 0.9 % injection 3 mL, 3 mL, Intravenous, PRN, Wynetta Fines T, MD .  thiamine tablet 100 mg, 100 mg, Oral, Daily **OR** thiamine (B-1) injection 100 mg, 100 mg, Intravenous, Daily, Zhang, Ping T, MD .  triamcinolone ointment (KENALOG) 0.1 % 1 application, 1 application, Topical, BID, Zhang, Ping T, MD .  zolpidem (AMBIEN) tablet 15 mg, 15 mg, Oral, QHS, Zhang, Pearletha Forge T, MD  Current Outpatient Medications:  .  amphetamine-dextroamphetamine (ADDERALL) 30 MG tablet, Take 1 tablet by mouth in the morning, 1 tablet at noon, and 1/2 tablet at 4PM (Patient taking differently: Take 15-30 mg by mouth See admin instructions. Take 30 mg by mouth in the morning, 30 mg  at noon, and 15 mg  at 4PM), Disp: 75 tablet, Rfl: 0 .  cetaphil (CETAPHIL) lotion, Apply 1 application topically as needed for dry skin. , Disp: , Rfl:  .  cloNIDine (CATAPRES) 0.3 MG tablet, TAKE 1 TABLET(0.3 MG) BY MOUTH TWICE DAILY (Patient taking differently: Take 0.3 mg by mouth 2 (two) times daily. ), Disp: 180 tablet, Rfl: 0 .  DUPIXENT 300 MG/2ML prefilled syringe, Inject 300 mg into the skin every 14 (fourteen) days., Disp: , Rfl:  .  FLUoxetine (PROZAC) 20 MG capsule, TAKE 2 CAPSULES(40 MG) BY MOUTH DAILY (Patient taking differently: Take 40 mg by mouth daily. ), Disp: 180 capsule, Rfl: 0 .  metoprolol succinate (TOPROL-XL) 100 MG 24 hr tablet, TAKE 1 TABLET(100 MG) BY MOUTH DAILY WITH LUNCH (Patient taking differently: Take 100 mg by mouth daily with lunch. ), Disp: 30 tablet, Rfl: 1 .  OLANZapine (ZYPREXA) 10 MG tablet, TAKE 1/2 TO  1 TABLET BY MOUTH DAILY AS NEEDED FOR AGITATION OR ANXIETY (Patient taking differently: Take 5-10 mg by mouth daily as needed (agitation or anxiety). ), Disp: 15 tablet, Rfl: 1 .  ondansetron (ZOFRAN) 4 MG tablet, TAKE 1 TABLET(4 MG) BY MOUTH EVERY 8 HOURS AS NEEDED FOR NAUSEA OR VOMITING (Patient taking differently: Take 4 mg by mouth every 8 (eight) hours as needed for nausea or vomiting. ), Disp: 30 tablet, Rfl: 0 .  [START ON 05/28/2020] pregabalin (LYRICA) 150 MG capsule, Take 1 capsule by mouth twice daily (Patient taking differently: Take 150 mg by mouth 2 (two) times daily. ), Disp: 60 capsule, Rfl: 5 .  triamcinolone ointment (KENALOG) 0.1 %, Apply 1 application topically See admin instructions. Apply to affected area twice daily for 2 weeks on then 1 week off as needed for flare ups, Disp: , Rfl:  .  zolpidem (AMBIEN) 10 MG tablet, TAKE 1 AND 1/2 TABLETS(15 MG) BY MOUTH AT BEDTIME (Patient taking differently: Take 15 mg by mouth at bedtime. ), Disp: 45 tablet, Rfl: 2 .  cephALEXin (KEFLEX) 500 MG capsule, 2 caps po bid x 7 days (Patient not taking: Reported on 02/15/2020), Disp: 28 capsule, Rfl: 0 .  doxycycline (VIBRA-TABS) 100 MG tablet, Take 1 tablet (100 mg total) by mouth 2 (two) times daily. (Patient not taking: Reported on 02/15/2020), Disp: 20 tablet, Rfl: 0 .  hydrOXYzine (ATARAX/VISTARIL) 10 MG tablet, TAKE 1 TABLET(10 MG) BY MOUTH THREE TIMES DAILY AS NEEDED (Patient taking differently: Take 10 mg by mouth 3 (three) times daily as needed for itching. ), Disp: 90 tablet, Rfl: 3 .  levocetirizine (XYZAL) 5 MG tablet, Take 5 mg by mouth at bedtime as needed for allergies or itching., Disp: , Rfl:   ROS:    General ROS: negative for - chills, fatigue, fever, night sweats, weight gain or weight loss Psychological ROS: Positive for -depression  ophthalmic ROS: negative for - blurry vision, double vision, eye pain or loss of vision ENT ROS: negative for - epistaxis, nasal discharge, oral  lesions, sore throat, tinnitus or vertigoe Respiratory ROS: Positive for -  shortness of breath  Cardiovascular ROS: negative for - chest pain, dyspnea on exertion, edema or irregular heartbeat Gastrointestinal ROS: negative for - abdominal pain, diarrhea, hematemesis, nausea/vomiting or stool incontinence Genito-Urinary ROS: negative for - dysuria, hematuria, incontinence or urinary frequency/urgency Musculoskeletal ROS: negative for - joint swelling or muscular weakness Neurological ROS: as noted in HPI Dermatological ROS: negative for rash and skin lesion changes  Exam: Current vital signs: BP (!) 159/86   Pulse 67   Resp 10   SpO2 99%  Vital signs in last 24 hours: Pulse Rate:  [58-68] 67 (09/09 1230) Resp:  [10-25] 10 (09/09 1230) BP: (152-224)/(80-126) 159/86 (09/09 1230) SpO2:  [95 %-100 %] 99 % (09/09 1230)   Constitutional: Appears well-developed and well-nourished.  Psych: Affect appropriate to situation Eyes: No scleral injection HENT: No OP obstrucion Head: Normocephalic.  Cardiovascular: Palpable.  Respiratory: Slightly labored GI: Soft.  No distension. There is no tenderness.  Skin: WDI  Neuro: Mental Status: Patient is awake, alert, oriented to person, place, month, year, and situation. Speech-dysarthric/hoarse, no aphasia or difficulty expressing himself or understanding Able to follow all commands without difficulty Cranial Nerves: II: Visual Fields are full.  III,IV, VI: EOMI without ptosis or diploplia. Pupils equal, round and reactive to light V: Facial sensation is symmetric to temperature VII: Slight right facial droop however patient is adentulous VIII: hearing is intact to voice X: Palat elevates symmetrically XI: Shoulder shrug is symmetric. XII: tongue is midline without atrophy or fasciculations.  Motor: Tone is normal. Bulk is normal. 5/5 strength was present in all four extremities.  aterixis present when arms are held out in front of him  most notably on the left wrist Sensory: Sensation is symmetric to light touch and temperature in the arms and legs. Deep Tendon Reflexes: 2+ and symmetric in the biceps and patellae.  Plantars: Toes are downgoing bilaterally.  Cerebellar: FNF and HKS are intact bilaterally  Labs I have reviewed labs in epic and the results pertinent to this consultation are:   CBC  Component Value Date/Time   WBC 14.1 (H) 05/24/2020 1001   RBC 4.92 05/24/2020 1001   HGB 14.6 05/24/2020 1013   HCT 43.0 05/24/2020 1013   PLT 364 05/24/2020 1001   MCV 89.0 05/24/2020 1001   MCH 28.7 05/24/2020 1001   MCHC 32.2 05/24/2020 1001   RDW 15.3 05/24/2020 1001   LYMPHSABS 2.2 05/24/2020 1001   MONOABS 0.7 05/24/2020 1001   EOSABS 0.1 05/24/2020 1001   BASOSABS 0.1 05/24/2020 1001    CMP     Component Value Date/Time   NA 135 05/24/2020 1013   K 3.4 (L) 05/24/2020 1013   CL 99 05/24/2020 1001   CO2 23 05/24/2020 1001   GLUCOSE 164 (H) 05/24/2020 1001   BUN 11 05/24/2020 1001   CREATININE 1.13 05/24/2020 1001   CREATININE TEST NOT PERFORMED 09/03/2012 1527   CALCIUM 9.5 05/24/2020 1001   PROT 6.6 05/24/2020 1001   ALBUMIN 3.5 05/24/2020 1001   AST 32 05/24/2020 1001   ALT 20 05/24/2020 1001   ALKPHOS 101 05/24/2020 1001   BILITOT 0.8 05/24/2020 1001   GFRNONAA >60 05/24/2020 1001   GFRNONAA TEST NOT PERFORMED 09/03/2012 1527   GFRAA >60 05/24/2020 1001   GFRAA TEST NOT PERFORMED 09/03/2012 1527    Imaging I have reviewed the images obtained:  CT-scan of the brain-no acute abnormalities  MRI examination of the brain-pending  Etta Quill PA-C [facility] [phone]  M-F  (9:00 am- 5:00 PM)  05/24/2020, 1:20 PM    Attending addendum Patient seen and examined for altered mental status Obtained history from the wife at bedside. Obtain review of system from the wife at bedside Discharge review conducted I have independently reviewed imaging  Assessment:  This  is a 64 year old male presenting to the hospital after having acute shortness of breath and altered mental status.  In addition blood pressure was noted to be systolically in the 233I.  Exam initially with ED doc showed altered mental status however on my exam after which his blood pressure had been significantly lowered he showed no agitation and was much more alert and oriented.  Most likely this represents hypertensive encephalopathy however given the severe elevation of blood pressure cannot rule out PRES or, which is less likely postictal state.  Impression: -Hypertensive urgency - evaluate for PRES -Altered mental status which improved with lowering blood pressure   Recommendations: -Treat blood pressure-unless MRI shows ischemic stroke, systolic blood pressure should be tolerated to be less than 180 and eventually upon discharge less than 140/90 -MRI brain without contrast -EEG   We will follow with you  -- Amie Portland, MD [facility] Pager: [phone] If 7pm to 7am, please call on call as listed on AMION.

## 2020-05-24 NOTE — ED Provider Notes (Addendum)
[facility] EMERGENCY DEPARTMENT Provider Note   CSN: [id number] Arrival date & time: 05/24/20  2703     History No chief complaint on file.   David [name] is a 64 y.o. male.  HPI Patient is brought by EMS.  His wife is present as additional historian.  Patient's wife reports that they had a normal evening yesterday.  She had gotten home from work and they had eaten a cheese sandwich together at about 9 PM.  She went to bed.  She reports he usually sleeps on the couch so she does not see him sleeping at night.  This morning, she had gone outside to take a walk.  She reports that she is coming back to the house he was standing at the door yelling for the neighbor and to get help.  He seemed very distressed.  She reports that he seemed somewhat confused but also extremely anxious and short of breath.  She reports that he was sweating and pale and looked like he was having trouble breathing so she called EMS.    Per EMS:"difficulty breathing" called in by pt's wife. EMS reports that upon their arrival pt looked pale, lethargic & was breathing hard with abdominal contractions & had diminished lung sounds in all lobes with some wheezing heard in both upper lobes. He was given 2 DuoNeb treatments 125 mg Solu-Medrol and magnesium infusion.  Heart rate remained in the 60s.  Patient was oriented x4 on arrival.  Patient denies that he felt like he had chest pain.  He reports he feels like he feels very "bad all over".  He feels kind of confused.  He reports he did feel very short of breath.  Typically, patient drinks about 5-6 beers per evening.  He denies drinking more less.  It is equivocal whether or not to use marijuana.  His wife is quite sure that he did not.  Patient seems to endorse but then also seemed somewhat confused.  Patient reports he is compliant with his regular medications.    Past Medical History:  Diagnosis Date  . ADD (attention deficit disorder)   . Anemia     Microcytic  . Arthritis   . Bipolar disorder ([place])   . Depression   . Dyspnea    history of no current issues 05/26/2019  . GAD (generalized anxiety disorder)   . History of kidney stones    passed  . HTN (hypertension)   . PTSD (post-traumatic stress disorder)   . Skin cancer     Patient Active Problem List   Diagnosis Date Noted  . HTN (hypertension), malignant 05/24/2020  . Primary osteoarthritis of left hip 05/27/2019  . GAD (generalized anxiety disorder) 08/03/2018  . ADD (attention deficit disorder) 08/03/2018  . Closed right hip fracture, initial encounter ([place]) 06/23/2018  . HTN (hypertension) 06/23/2018  . Hypertensive urgency 06/23/2018  . Depression with anxiety 06/23/2018  . Microcytic anemia 06/23/2018  . Alcohol abuse with alcohol-induced mood disorder ([place]) 03/16/2018    Past Surgical History:  Procedure Laterality Date  . BACK SURGERY     Fusion - lumbar  . HIP PINNING,CANNULATED Right 06/24/2018   Procedure: RIGHT CANNULATED HIP PINNING;  Surgeon: Erle Crocker, MD;  Location: [facility];  Service: Orthopedics;  Laterality: Right;  . LITHOTRIPSY    . MULTIPLE EXTRACTIONS WITH ALVEOLOPLASTY Bilateral 05/03/2019   Procedure: MULTIPLE EXTRACTION TEETH NUMBER TWO, THREE, FIVE, SIX, SEVEN, EIGHT, NINE, TEN, ELEVEN, FIFTEEN, SIXTEEN, SEVENTEEN, NINETEEN, TWENTY, TWENTY-ONE,  TWENTY-TWO, TWENTY-THREE, TWENTY-FOUR, TWENTY-FIVE, TWENTY-SIX, TWENTY-SEVEN, TWENTY-EIGHT, TWENTY-NINE, THIRTY-TWO WITH ALVEOLOPLASTY;  Surgeon: Diona Browner, DDS;  Location: [facility];  Service: Oral Surgery;  Laterality: Bilateral;  . NOSE SURGERY     x3  . TOTAL HIP ARTHROPLASTY Left 05/27/2019   Procedure: TOTAL HIP ARTHROPLASTY ANTERIOR APPROACH;  Surgeon: Dorna Leitz, MD;  Location: [facility];  Service: Orthopedics;  Laterality: Left;       Family History  Problem Relation Age of Onset  . Arthritis Other   . Heart disease Other   . Cancer Other   . Hypertension Other     Social  History   Tobacco Use  . Smoking status: Current Every Day Smoker    Packs/day: 1.00    Years: 40.00    Pack years: 40.00    Last attempt to quit: 04/25/2019    Years since quitting: 1.0  . Smokeless tobacco: Never Used  Vaping Use  . Vaping Use: Never used  Substance Use Topics  . Alcohol use: Yes    Comment: daily-6-7/day  . Drug use: Not Currently    Types: Marijuana    Home Medications Prior to Admission medications   Medication Sig Start Date End Date Taking? Authorizing Provider  amphetamine-dextroamphetamine (ADDERALL) 30 MG tablet Take 1 tablet by mouth in the morning, 1 tablet at noon, and 1/2 tablet at 4PM Patient taking differently: Take 15-30 mg by mouth See admin instructions. Take 30 mg by mouth in the morning, 30 mg  at noon, and 15 mg  at 4PM 05/10/20  Yes Cottle, Billey Co., MD  cetaphil (CETAPHIL) lotion Apply 1 application topically as needed for dry skin.    Yes [provider]  cloNIDine (CATAPRES) 0.3 MG tablet TAKE 1 TABLET(0.3 MG) BY MOUTH TWICE DAILY Patient taking differently: Take 0.3 mg by mouth 2 (two) times daily.  03/29/20  Yes Cottle, Billey Co., MD  DUPIXENT 300 MG/2ML prefilled syringe Inject 300 mg into the skin every 14 (fourteen) days. 05/18/20  Yes [provider]  FLUoxetine (PROZAC) 20 MG capsule TAKE 2 CAPSULES(40 MG) BY MOUTH DAILY Patient taking differently: Take 40 mg by mouth daily.  03/29/20  Yes Cottle, Billey Co., MD  metoprolol succinate (TOPROL-XL) 100 MG 24 hr tablet TAKE 1 TABLET(100 MG) BY MOUTH DAILY WITH LUNCH Patient taking differently: Take 100 mg by mouth daily with lunch.  03/29/20  Yes Cottle, Billey Co., MD  OLANZapine (ZYPREXA) 10 MG tablet TAKE 1/2 TO 1 TABLET BY MOUTH DAILY AS NEEDED FOR AGITATION OR ANXIETY Patient taking differently: Take 5-10 mg by mouth daily as needed (agitation or anxiety).  02/16/20  Yes Cottle, Billey Co., MD  ondansetron (ZOFRAN) 4 MG tablet TAKE 1 TABLET(4 MG) BY MOUTH EVERY 8  HOURS AS NEEDED FOR NAUSEA OR VOMITING Patient taking differently: Take 4 mg by mouth every 8 (eight) hours as needed for nausea or vomiting.  01/12/20  Yes Cottle, Billey Co., MD  pregabalin (LYRICA) 150 MG capsule Take 1 capsule by mouth twice daily Patient taking differently: Take 150 mg by mouth 2 (two) times daily.  05/28/20  Yes Cottle, Billey Co., MD  triamcinolone ointment (KENALOG) 0.1 % Apply 1 application topically See admin instructions. Apply to affected area twice daily for 2 weeks on then 1 week off as needed for flare ups 02/14/20  Yes [provider]  zolpidem (AMBIEN) 10 MG tablet TAKE 1 AND 1/2 TABLETS(15 MG) BY MOUTH AT BEDTIME Patient taking differently:  Take 15 mg by mouth at bedtime.  03/05/20  Yes Cottle, Billey Co., MD  cephALEXin (KEFLEX) 500 MG capsule 2 caps po bid x 7 days Patient not taking: Reported on 02/15/2020 12/23/19   Mesner, Corene Cornea, MD  doxycycline (VIBRA-TABS) 100 MG tablet Take 1 tablet (100 mg total) by mouth 2 (two) times daily. Patient not taking: Reported on 02/15/2020 12/26/19   Dorie Rank, MD  hydrOXYzine (ATARAX/VISTARIL) 10 MG tablet TAKE 1 TABLET(10 MG) BY MOUTH THREE TIMES DAILY AS NEEDED Patient taking differently: Take 10 mg by mouth 3 (three) times daily as needed for itching.  04/30/20   Cottle, Billey Co., MD  levocetirizine (XYZAL) 5 MG tablet Take 5 mg by mouth at bedtime as needed for allergies or itching. 05/01/20   [provider]    Allergies    Codeine  Review of Systems   Review of Systems Level 5 caveat review of systems unreliable at this time due to confusion. Physical Exam Updated Vital Signs BP (!) 159/86   Pulse 67   Resp 10   SpO2 99%   Physical Exam Constitutional:      Comments: Patient has moderate increased work of breathing.  Seems somewhat somnolent and confused.  Mildly diaphoretic.  On 4 L oxygen.  HENT:     Head: Normocephalic and atraumatic.     Nose: Nose normal.     Mouth/Throat:     Mouth:  Mucous membranes are moist.     Pharynx: Oropharynx is clear.     Comments: Edentulous.  Airway widely patent. Eyes:     Extraocular Movements: Extraocular movements intact.     Pupils: Pupils are equal, round, and reactive to light.  Cardiovascular:     Rate and Rhythm: Normal rate and regular rhythm.     Pulses: Normal pulses.     Heart sounds: Normal heart sounds.  Pulmonary:     Comments: Moderate increased work of breathing.  Crackles at the bases.  No gross wheeze or rhonchi. Abdominal:     General: There is no distension.     Palpations: Abdomen is soft.     Tenderness: There is no abdominal tenderness. There is no guarding.  Musculoskeletal:        General: No swelling or tenderness. Normal range of motion.     Cervical back: Neck supple.     Right lower leg: No edema.     Left lower leg: No edema.  Skin:    Comments: Skin slightly diaphoretic and pale.  Warm to the touch.  Neurological:     Comments: Patient seems somnolent and slightly lethargic.  He can answer questions with sentences that are situationally appropriate.  Mild confusion regarding history.  He can follow commands and moves all extremities without focal neurologic deficits.  Patient did become somewhat combative and began reaching for monitors and IVs.     ED Results / Procedures / Treatments   Labs (all labs ordered are listed, but only abnormal results are displayed) Labs Reviewed  COMPREHENSIVE METABOLIC PANEL - Abnormal; Notable for the following components:      Result Value   Sodium 134 (*)    Glucose, Bld 164 (*)    All other components within normal limits  BRAIN NATRIURETIC PEPTIDE - Abnormal; Notable for the following components:   B Natriuretic Peptide 608.4 (*)    All other components within normal limits  LACTIC ACID, PLASMA - Abnormal; Notable for the following components:   Lactic Acid,  Venous 2.6 (*)    All other components within normal limits  CBC WITH DIFFERENTIAL/PLATELET -  Abnormal; Notable for the following components:   WBC 14.1 (*)    Neutro Abs 11.0 (*)    Abs Immature Granulocytes 0.10 (*)    All other components within normal limits  URINALYSIS, ROUTINE W REFLEX MICROSCOPIC - Abnormal; Notable for the following components:   Color, Urine STRAW (*)    All other components within normal limits  RAPID URINE DRUG SCREEN, HOSP PERFORMED - Abnormal; Notable for the following components:   Amphetamines POSITIVE (*)    Tetrahydrocannabinol POSITIVE (*)    All other components within normal limits  SALICYLATE LEVEL - Abnormal; Notable for the following components:   Salicylate Lvl <4.7 (*)    All other components within normal limits  ACETAMINOPHEN LEVEL - Abnormal; Notable for the following components:   Acetaminophen (Tylenol), Serum <10 (*)    All other components within normal limits  I-STAT CHEM 8, ED - Abnormal; Notable for the following components:   Potassium 3.4 (*)    Glucose, Bld 160 (*)    All other components within normal limits  I-STAT VENOUS BLOOD GAS, ED - Abnormal; Notable for the following components:   Potassium 3.3 (*)    All other components within normal limits  I-STAT ARTERIAL BLOOD GAS, ED - Abnormal; Notable for the following components:   pO2, Arterial 79 (*)    Potassium 3.4 (*)    All other components within normal limits  SARS CORONAVIRUS 2 BY RT PCR (HOSPITAL ORDER, [place] [facility])  CULTURE, BLOOD (ROUTINE X 2)  CULTURE, BLOOD (ROUTINE X 2)  LIPASE, BLOOD  PROTIME-INR  AMMONIA  ETHANOL  BLOOD GAS, ARTERIAL  BLOOD GAS, VENOUS  HIV ANTIBODY (ROUTINE TESTING W REFLEX)  COMPREHENSIVE METABOLIC PANEL  MAGNESIUM  PHOSPHORUS  CBC  TROPONIN I (HIGH SENSITIVITY)  TROPONIN I (HIGH SENSITIVITY)    EKG EKG Interpretation  Date/Time:  Thursday May 24 2020 11:12:07 EDT Ventricular Rate:  61 PR Interval:    QRS Duration: 101 QT Interval:  462 QTC Calculation: 466 R Axis:   103 Text  Interpretation: Sinus rhythm Probable left atrial enlargement Right axis deviation t wave inversion III, V1  compared to previous. Confirmed by Charlesetta Shanks [phone]) on 05/24/2020 12:42:45 PM   Radiology CT Head Wo Contrast  Result Date: 05/24/2020 CLINICAL DATA:  Mental status change. EXAM: CT HEAD WITHOUT CONTRAST TECHNIQUE: Contiguous axial images were obtained from the base of the skull through the vertex without intravenous contrast. COMPARISON:  CT head 12/22/2019 FINDINGS: Brain: No evidence of acute large vascular territory infarction, acute hemorrhage, hydrocephalus, extra-axial collection or mass lesion/mass effect. Mild diffuse cerebral atrophy. Mild patchy white matter hypoattenuation, likely the sequela of chronic microvascular ischemic disease. Vascular: Calcific intracranial atherosclerosis. Skull: Normal. Negative for fracture or focal lesion. Sinuses/Orbits: Mild paranasal sinus mucosal thickening without air-fluid levels. No acute orbital abnormality. Other: No mastoid effusion. IMPRESSION: No acute intracranial abnormality. Electronically Signed   By: Margaretha Sheffield MD   On: 05/24/2020 10:41   DG Chest Port 1 View  Result Date: 05/24/2020 CLINICAL DATA:  Shortness of breath EXAM: PORTABLE CHEST 1 VIEW COMPARISON:  February 15, 2020 FINDINGS: There is atelectatic change in the lower lung regions. There is no edema or airspace opacity. Heart is upper normal in size with pulmonary vascularity normal. No adenopathy. No bone lesions. IMPRESSION: Bibasilar atelectasis. No edema or airspace opacity. Heart upper normal in  size. Electronically Signed   By: Lowella Grip III M.D.   On: 05/24/2020 09:33    Procedures Procedures (including critical care time) CRITICAL CARE Performed by: Charlesetta Shanks   Total critical care time: 60 minutes  Critical care time was exclusive of separately billable procedures and treating other patients.  Critical care was necessary to treat or prevent  imminent or life-threatening deterioration.  Critical care was time spent personally by me on the following activities: development of treatment plan with patient and/or surrogate as well as nursing, discussions with consultants, evaluation of patient's response to treatment, examination of patient, obtaining history from patient or surrogate, ordering and performing treatments and interventions, ordering and review of laboratory studies, ordering and review of radiographic studies, pulse oximetry and re-evaluation of patient's condition. Medications Ordered in ED Medications  carvedilol (COREG) tablet 3.125 mg (has no administration in time range)  FLUoxetine (PROZAC) capsule 40 mg (0 mg Oral Hold 05/24/20 1338)  hydrOXYzine (ATARAX/VISTARIL) tablet 10 mg (has no administration in time range)  OLANZapine (ZYPREXA) tablet 5 mg (has no administration in time range)  zolpidem (AMBIEN) tablet 10 mg (has no administration in time range)  ondansetron (ZOFRAN) tablet 4 mg (has no administration in time range)  cetaphil lotion 1 application (has no administration in time range)  sodium chloride flush (NS) 0.9 % injection 3 mL (has no administration in time range)  sodium chloride flush (NS) 0.9 % injection 3 mL (has no administration in time range)  0.9 %  sodium chloride infusion (has no administration in time range)  acetaminophen (TYLENOL) tablet 650 mg (has no administration in time range)  ondansetron (ZOFRAN) injection 4 mg (has no administration in time range)  enoxaparin (LOVENOX) injection 40 mg (has no administration in time range)  furosemide (LASIX) injection 20 mg (has no administration in time range)  lisinopril (ZESTRIL) tablet 10 mg (0 mg Oral Hold 05/24/20 1340)  aspirin EC tablet 81 mg (0 mg Oral Hold 05/24/20 1340)  hydrALAZINE (APRESOLINE) tablet 25 mg (has no administration in time range)  LORazepam (ATIVAN) tablet 1-4 mg (has no administration in time range)    Or  LORazepam (ATIVAN)  injection 1-4 mg (has no administration in time range)  thiamine tablet 100 mg (0 mg Oral Hold 05/24/20 1341)    Or  thiamine (B-1) injection 100 mg ( Intravenous See Alternative 01/16/96 6734)  folic acid (FOLVITE) tablet 1 mg (0 mg Oral Hold 05/24/20 1341)  multivitamin with minerals tablet 1 tablet (0 tablets Oral Hold 05/24/20 1341)  amLODipine (NORVASC) tablet 5 mg (0 mg Oral Hold 05/24/20 1342)  loratadine (CLARITIN) tablet 10 mg (has no administration in time range)  haloperidol lactate (HALDOL) injection 5 mg (5 mg Intravenous Given 05/24/20 0925)  hydrALAZINE (APRESOLINE) injection 20 mg (20 mg Intravenous Given 05/24/20 1130)  LORazepam (ATIVAN) injection 1 mg (1 mg Intravenous Given 05/24/20 1215)  haloperidol lactate (HALDOL) injection 2 mg (2 mg Intravenous Given 05/24/20 1318)  LORazepam (ATIVAN) injection 2 mg (2 mg Intravenous Given 05/24/20 1320)    ED Course  I have reviewed the triage vital signs and the nursing notes.  Pertinent labs & imaging results that were available during my care of the patient were reviewed by me and considered in my medical decision making (see chart for details).  Clinical Course as of May 24 1358  Thu May 24, 2020  1357 Patient already been admitted, he became slightly more agitated again saying that he had to leave.  He  had taken off his monitor but IV is still in place.  Patient's wife reported that she could not keep redirecting him as he was constantly picking at his monitors and insisting on leaving.  Patient was interviewed for person time place.  His speech remains somewhat slurred and he seemed mildly impaired.  He can correctly identify some answers but other times is getting confused.  At this time I still do not feel patient is stable for discharge and capacity for decision-making to leave.  Patient reports that he needs something additionally for sedation to be comfortable.  Reports he just feels uncomfortable all over.  Haldol 5 mg and Ativan 2 mg  ordered.   [MP]    Clinical Course User Index [MP] Charlesetta Shanks, MD   MDM Rules/Calculators/A&P                         Consult: Neurology for altered mental status. Consult: [facility] hospitalist Dr. Roosevelt Locks for admission.  Patient presents as outlined above.  Initial presentation concerning for hypertensive emergency with congestive heart failure.  He did have some crackles present on exam.  He was maintaining oxygen saturation on 4 L at arrival.  Chest x-ray reviewed by myself did not show significant vascular congestion as expected.  Initially, my impression was that patient might need intubation if continued deterioration however, he gradually improved over period of evaluation and treatment.  At this time, respirations are nonlabored.  I have been able to turn off his oxygen and he is maintaining oxygen saturation 99% on room air.  Patient was given 2 DuoNeb's and Solu-Medrol and magnesium by EMS.  Possible initial distress more related to asthma or COPD then CHF, although mild BNP elevation is present.  Initial EKG was reviewed from EMS.  This did not show any acute ischemic changes.  EKG done in the ED shows some T wave inversions which appear new compared to older tracings.  Troponin has been flat without abnormal elevation.  Patient's mental status was confused and somewhat lethargic on arrival but without focal deficits.  Patient's blood pressure was in the 200s over 100 and remain consistently elevated upon recheck.  Consideration given to hypertensive encephalopathy.  Patient was given hydralazine 20 mg.  Blood pressures responded by decreasing the 150s over 80s and have remained consistently at this level.  Patient had also had Haldol just shortly after arrival due to agitation and combativeness potentially interfering with treatment.  He was also given an additional 1 mg dose of Ativan later in the course of treatment as the patient's wife reports he was getting slightly more restless again.   I have reassessed the patient and he is oriented to person, place, time and location.  He still has a slightly lethargic appearance but no focal motor deficits and oriented.  Per neurology, plan will likely be to proceed with MRI.  EMR review indicates patient has some history of drug abuse in addition to daily alcohol use.  UDS have tested positive for marijuana and cocaine in the past.  Patient's wife denies that he has been using these substances recently.  UDS pending.  At this time, patient is stable for admission to hospitalist service.  He no longer has respiratory distress and oxygenation is stable.  Mental status is somewhat lethargic and mildly confused but without focal motor deficit and without risk of airway failure. Final Clinical Impression(s) / ED Diagnoses Final diagnoses:  Hypertensive emergency  Encephalopathy acute  Shortness of breath    Rx / DC Orders ED Discharge Orders    None       Charlesetta Shanks, MD 05/24/20 [place], [place], MD 05/24/20 1359

## 2020-05-24 NOTE — H&P (Signed)
History and Physical    ELMOR KOST [name]:[id number] DOB: 05-10-1956 DOA: 05/24/2020  PCP: [name], MD (Confirm with patient/family/NH records and if not entered, this has to be entered at [facility] point of entry) Patient coming from: Home  I have personally briefly reviewed patient's old medical records in [place]  Chief Complaint: AMS  HPI: David [name] is a 64 y.o. male with medical history significant of hypertension, PTSD, bipolar disorder, chronic alcohol abuse, polysubstance abuse including marijuana and cocaine, presented with sudden onset of shortness of breath and confusion.  Patient remained confused in the ED, most history provided by his wife at bedside.  Wife reported that patient woke up this morning complains about feeling shortness breath and looks confused.  Last seen normal last night.  Wife suspected patient not compliant with his blood pressure medications including clonidine and metoprolol.  In the past, patient had overdosed his BP meds this year, and wife reported that sometimes patient did miss doses.  Patient drinks 4-5 beers a day and last drink was last night, patient also admitted use marijuana on a weekly basis but denied cocaine use.  Patient denied any cough no chest pain no fever chills no urinary symptoms no diarrhea.  No headache no blurry vision. ED Course: O2 saturation in the 80s, blood pressure increased to 200s/100s, was tachypneic.  X-ray showed some lung congestion, blood pressure improved with 1 dose of IV hydralazine.  O2 saturation also improved and stabilized on room air.  CT head negative for acute abnormalities.  WBC 14.1, sodium 134, BUN 11, creatinine 1.1.  Troponin 9.  Review of Systems: Unable to perform patient confused   Past Medical History:  Diagnosis Date   ADD (attention deficit disorder)    Anemia    Microcytic   Arthritis    Bipolar disorder ([place])    Depression    Dyspnea    history of no current issues  05/26/2019   GAD (generalized anxiety disorder)    History of kidney stones    passed   HTN (hypertension)    PTSD (post-traumatic stress disorder)    Skin cancer     Past Surgical History:  Procedure Laterality Date   BACK SURGERY     Fusion - lumbar   HIP PINNING,CANNULATED Right 06/24/2018   Procedure: RIGHT CANNULATED HIP PINNING;  Surgeon: Erle Crocker, MD;  Location: [facility];  Service: Orthopedics;  Laterality: Right;   LITHOTRIPSY     MULTIPLE EXTRACTIONS WITH ALVEOLOPLASTY Bilateral 05/03/2019   Procedure: MULTIPLE EXTRACTION TEETH NUMBER TWO, THREE, FIVE, SIX, SEVEN, EIGHT, NINE, TEN, ELEVEN, FIFTEEN, SIXTEEN, SEVENTEEN, NINETEEN, TWENTY, TWENTY-ONE, TWENTY-TWO, TWENTY-THREE, TWENTY-FOUR, TWENTY-FIVE, TWENTY-SIX, TWENTY-SEVEN, TWENTY-EIGHT, TWENTY-NINE, THIRTY-TWO WITH ALVEOLOPLASTY;  Surgeon: Diona Browner, DDS;  Location: [place];  Service: Oral Surgery;  Laterality: Bilateral;   NOSE SURGERY     x3   TOTAL HIP ARTHROPLASTY Left 05/27/2019   Procedure: TOTAL HIP ARTHROPLASTY ANTERIOR APPROACH;  Surgeon: Dorna Leitz, MD;  Location: [facility];  Service: Orthopedics;  Laterality: Left;     reports that he has been smoking. He has a 40.00 pack-year smoking history. He has never used smokeless tobacco. He reports current alcohol use. He reports previous drug use. Drug: Marijuana.  Allergies  Allergen Reactions   Codeine Nausea Only    Family History  Problem Relation Age of Onset   Arthritis Other    Heart disease Other    Cancer Other    Hypertension Other  Prior to Admission medications   Medication Sig Start Date End Date Taking? Authorizing Provider  amphetamine-dextroamphetamine (ADDERALL) 30 MG tablet Take 1 tablet by mouth in the morning, 1 tablet at noon, and 1/2 tablet at 4PM Patient taking differently: Take 15-30 mg by mouth See admin instructions. Take 30 mg by mouth in the morning, 30 mg  at noon, and 15 mg  at 4PM 05/10/20  Yes [name],  David Co., MD  cetaphil (CETAPHIL) lotion Apply 1 application topically as needed for dry skin.    Yes [provider]  cloNIDine (CATAPRES) 0.3 MG tablet TAKE 1 TABLET(0.3 MG) BY MOUTH TWICE DAILY Patient taking differently: Take 0.3 mg by mouth 2 (two) times daily.  03/29/20  Yes [name], David Co., MD  DUPIXENT 300 MG/2ML prefilled syringe Inject 300 mg into the skin every 14 (fourteen) days. 05/18/20  Yes [provider]  FLUoxetine (PROZAC) 20 MG capsule TAKE 2 CAPSULES(40 MG) BY MOUTH DAILY Patient taking differently: Take 40 mg by mouth daily.  03/29/20  Yes [name], David Co., MD  metoprolol succinate (TOPROL-XL) 100 MG 24 hr tablet TAKE 1 TABLET(100 MG) BY MOUTH DAILY WITH LUNCH Patient taking differently: Take 100 mg by mouth daily with lunch.  03/29/20  Yes [name], David Co., MD  OLANZapine (ZYPREXA) 10 MG tablet TAKE 1/2 TO 1 TABLET BY MOUTH DAILY AS NEEDED FOR AGITATION OR ANXIETY Patient taking differently: Take 5-10 mg by mouth daily as needed (agitation or anxiety).  02/16/20  Yes [name], David Co., MD  ondansetron (ZOFRAN) 4 MG tablet TAKE 1 TABLET(4 MG) BY MOUTH EVERY 8 HOURS AS NEEDED FOR NAUSEA OR VOMITING Patient taking differently: Take 4 mg by mouth every 8 (eight) hours as needed for nausea or vomiting.  01/12/20  Yes [name], David Co., MD  pregabalin (LYRICA) 150 MG capsule Take 1 capsule by mouth twice daily Patient taking differently: Take 150 mg by mouth 2 (two) times daily.  05/28/20  Yes [name], David Co., MD  triamcinolone ointment (KENALOG) 0.1 % Apply 1 application topically See admin instructions. Apply to affected area twice daily for 2 weeks on then 1 week off as needed for flare ups 02/14/20  Yes [provider]  zolpidem (AMBIEN) 10 MG tablet TAKE 1 AND 1/2 TABLETS(15 MG) BY MOUTH AT BEDTIME Patient taking differently: Take 15 mg by mouth at bedtime.  03/05/20  Yes [name], David Co., MD  cephALEXin (KEFLEX) 500 MG capsule 2 caps po  bid x 7 days Patient not taking: Reported on 02/15/2020 12/23/19   [name], David Cornea, MD  doxycycline (VIBRA-TABS) 100 MG tablet Take 1 tablet (100 mg total) by mouth 2 (two) times daily. Patient not taking: Reported on 02/15/2020 12/26/19   Dorie Rank, MD  hydrOXYzine (ATARAX/VISTARIL) 10 MG tablet TAKE 1 TABLET(10 MG) BY MOUTH THREE TIMES DAILY AS NEEDED Patient taking differently: Take 10 mg by mouth 3 (three) times daily as needed for itching.  04/30/20   [name], David Co., MD  levocetirizine (XYZAL) 5 MG tablet Take 5 mg by mouth at bedtime as needed for allergies or itching. 05/01/20   [provider]    Physical Exam: Vitals:   05/24/20 1200 05/24/20 1215 05/24/20 1230 05/24/20 1245  BP: (!) 152/80 (!) 162/96 (!) 159/86 (!) 158/99  Pulse: 68 68 67 67  Resp: 19 18 10 13   SpO2: 100% 95% 99% 99%    Constitutional: NAD, calm, comfortable Vitals:   05/24/20 1200  05/24/20 1215 05/24/20 1230 05/24/20 1245  BP: (!) 152/80 (!) 162/96 (!) 159/86 (!) 158/99  Pulse: 68 68 67 67  Resp: 19 18 10 13   SpO2: 100% 95% 99% 99%   Eyes: PERRL, lids and conjunctivae normal ENMT: Mucous membranes are moist. Posterior pharynx clear of any exudate or lesions.Normal dentition.  Neck: normal, supple, no masses, no thyromegaly Respiratory: clear to auscultation bilaterally, bilateral basilar fine crackles. Normal respiratory effort. No accessory muscle use.  Cardiovascular: Regular rate and rhythm, no murmurs / rubs / gallops. No extremity edema. 2+ pedal pulses. No carotid bruits.  Abdomen: no tenderness, no masses palpated. No hepatosplenomegaly. Bowel sounds positive.  Musculoskeletal: no clubbing / cyanosis. No joint deformity upper and lower extremities. Good ROM, no contractures. Normal muscle tone.  Skin: no rashes, lesions, ulcers. No induration Neurologic: CN 2-12 grossly intact. Sensation intact, DTR normal. Strength 5/5 in all 4.  Coordination some words sluggish on bilateral lower  extremities Psychiatric: Somewhat confused, but AAO x3    Labs on Admission: I have personally reviewed following labs and imaging studies  CBC: Recent Labs  Lab 05/24/20 0955 05/24/20 1001 05/24/20 1013  WBC  --  14.1*  --   NEUTROABS  --  11.0*  --   HGB 15.0   15.0 14.1 14.6  HCT 44.0   44.0 43.8 43.0  MCV  --  89.0  --   PLT  --  364  --    Basic Metabolic Panel: Recent Labs  Lab 05/24/20 0955 05/24/20 1001 05/24/20 1013  NA 135   135 134* 135  K 3.4*   3.3* 3.8 3.4*  CL 99 99  --   CO2  --  23  --   GLUCOSE 160* 164*  --   BUN 12 11  --   CREATININE 1.10 1.13  --   CALCIUM  --  9.5  --    GFR: CrCl cannot be calculated (Unknown ideal weight.). Liver Function Tests: Recent Labs  Lab 05/24/20 1001  AST 32  ALT 20  ALKPHOS 101  BILITOT 0.8  PROT 6.6  ALBUMIN 3.5   Recent Labs  Lab 05/24/20 1001  LIPASE 25   Recent Labs  Lab 05/24/20 1002  AMMONIA 26   Coagulation Profile: Recent Labs  Lab 05/24/20 1001  INR 0.9   Cardiac Enzymes: No results for input(s): CKTOTAL, CKMB, CKMBINDEX, TROPONINI in the last 168 hours. BNP (last 3 results) No results for input(s): PROBNP in the last 8760 hours. HbA1C: No results for input(s): HGBA1C in the last 72 hours. CBG: No results for input(s): GLUCAP in the last 168 hours. Lipid Profile: No results for input(s): CHOL, HDL, LDLCALC, TRIG, CHOLHDL, LDLDIRECT in the last 72 hours. Thyroid Function Tests: No results for input(s): TSH, T4TOTAL, FREET4, T3FREE, THYROIDAB in the last 72 hours. Anemia Panel: No results for input(s): VITAMINB12, FOLATE, FERRITIN, TIBC, IRON, RETICCTPCT in the last 72 hours. Urine analysis:    Component Value Date/Time   COLORURINE STRAW (A) 05/24/2020 1230   APPEARANCEUR CLEAR 05/24/2020 1230   LABSPEC 1.005 05/24/2020 1230   PHURINE 6.0 05/24/2020 1230   GLUCOSEU NEGATIVE 05/24/2020 1230   HGBUR NEGATIVE 05/24/2020 1230   BILIRUBINUR NEGATIVE 05/24/2020 1230    KETONESUR NEGATIVE 05/24/2020 1230   PROTEINUR NEGATIVE 05/24/2020 1230   UROBILINOGEN 0.2 04/14/2009 0446   NITRITE NEGATIVE 05/24/2020 1230   LEUKOCYTESUR NEGATIVE 05/24/2020 1230    Radiological Exams on Admission: CT Head Wo Contrast  Result Date: 05/24/2020  CLINICAL DATA:  Mental status change. EXAM: CT HEAD WITHOUT CONTRAST TECHNIQUE: Contiguous axial images were obtained from the base of the skull through the vertex without intravenous contrast. COMPARISON:  CT head 12/22/2019 FINDINGS: Brain: No evidence of acute large vascular territory infarction, acute hemorrhage, hydrocephalus, extra-axial collection or mass lesion/mass effect. Mild diffuse cerebral atrophy. Mild patchy white matter hypoattenuation, likely the sequela of chronic microvascular ischemic disease. Vascular: Calcific intracranial atherosclerosis. Skull: Normal. Negative for fracture or focal lesion. Sinuses/Orbits: Mild paranasal sinus mucosal thickening without air-fluid levels. No acute orbital abnormality. Other: No mastoid effusion. IMPRESSION: No acute intracranial abnormality. Electronically Signed   By: Margaretha Sheffield MD   On: 05/24/2020 10:41   DG Chest Port 1 View  Result Date: 05/24/2020 CLINICAL DATA:  Shortness of breath EXAM: PORTABLE CHEST 1 VIEW COMPARISON:  February 15, 2020 FINDINGS: There is atelectatic change in the lower lung regions. There is no edema or airspace opacity. Heart is upper normal in size with pulmonary vascularity normal. No adenopathy. No bone lesions. IMPRESSION: Bibasilar atelectasis. No edema or airspace opacity. Heart upper normal in size. Electronically Signed   By: Lowella Grip III M.D.   On: 05/24/2020 09:33    EKG: Independently reviewed.  No acute ST changes  Assessment/Plan Active Problems:   Hypertensive urgency   HTN (hypertension), malignant  (please populate well all problems here in Problem List. (For example, if patient is on BP meds at home and you resume or decide  to hold them, it is a problem that needs to be her. Same for CAD, COPD, HLD and so on)  Acute metabolic encephalopathy probably secondary to hypertensive encephalopathy -Suspect patient not compliant with clonidine causing rebound hypertension emergency -Stop clonidine and metoprolol (patient has history of cocaine abuse), start amlodipine Coreg and ACEI, adjust according to response, add as needed hydralazine -UDS negative for cocaine but positive for amphetamines which might be from Adderall, hold Adderall for now. -CT head reassuring, neurology on board who plans for MRI to rule out PR ES -Patient was agitated in ED and received benzo plus Haldol.  Resume patient's bipolar medications.  Acute diastolic CHF decompensation -Secondary HTN uncontrolled, adjust BP meds as above, Lasix IV x1 -Echocardiogram  Alcohol abuse -No symptoms or signs of active withdrawal -CIWA protocol with as needed benzos  PTSD, bipolar disorder -Hold Adderall as above, continue other bipolar meds  DVT prophylaxis: Lovenox Code Status: Full code Family Communication: Wife at bedside Disposition Plan: Expect more than 2 midnight hospital stay to stabilize patient mentation and CHF work-up/blood pressure control Consults called: Neurology Admission status: Telemetry admission   Lequita Halt MD [facility] Pager [phone]  05/24/2020, 2:14 PM

## 2020-05-24 NOTE — ED Notes (Signed)
Pt transported back from MRI

## 2020-05-24 NOTE — Progress Notes (Signed)
EEG complete - results pending 

## 2020-05-24 NOTE — ED Notes (Signed)
Pt has removed the monitor and sat up demanding paperwork so that he can leave. EDP Pfeiffer has been made aware & is at bedside at this time.

## 2020-05-24 NOTE — Procedures (Signed)
Patient Name: David [name]  MRN: [id number]  Epilepsy Attending: Lora Havens  Referring Physician/Provider: Etta Quill, PA Date: 05/24/2020 Duration:  25.16 mins  Patient history: 64 year old male presenting to the hospital after having acute shortness of breath and altered mental status. EEG to evaluate for seizure.   Level of alertness: Awake, drowsy  AEDs during EEG study: Ativan  Technical aspects: This EEG study was done with scalp electrodes positioned according to the 10-20 International system of electrode placement. Electrical activity was acquired at a sampling rate of 500Hz  and reviewed with a high frequency filter of 70Hz  and a low frequency filter of 1Hz . EEG data were recorded continuously and digitally stored.   Description: No posterior dominant rhythm was seen. EEG showed continuous generalized polymorphic mixed frequencies 6-8Hz  theta-alpha activity as well as intermittent 13-15hz  beta activity in frontocentral region. Hyperventilation and photic stimulation were not performed.     ABNORMALITY -Continuous slow, generalized  IMPRESSION: This study is suggestive of moderate diffuse encephalopathy, nonspecific etiology. No seizures or epileptiform discharges were seen throughout the recording.  Wilson Sample Barbra Sarks

## 2020-05-24 NOTE — ED Triage Notes (Signed)
Pt came from home via [facility] d/t c/o "difficulty breathing" called in by pt's wife. EMS reports that upon their arrival pt looked pale, lethargic & was breathing hard with abdominal contractions & had diminished lung sounds in all lobes with some wheezing heard in both upper lobes. Pt has aj history of asthma, does NOT wear O2 at baseline. In route to ED pt received 2 Duo Neb Tx, 124 solumedrol & a mag infusion (per EMS). Wheezing was reported to worsen after the Neb Tx. 12 Lead was unremarkable, heart rate stayed in the 60's, & pt is A/Ox4 upon arrival.

## 2020-05-25 ENCOUNTER — Inpatient Hospital Stay (HOSPITAL_COMMUNITY): Payer: PPO

## 2020-05-25 DIAGNOSIS — I5031 Acute diastolic (congestive) heart failure: Secondary | ICD-10-CM

## 2020-05-25 LAB — ECHOCARDIOGRAM COMPLETE
Area-P 1/2: 2.54 cm2
Height: 68 in
S' Lateral: 2.9 cm
Weight: 2705.49 oz

## 2020-05-25 LAB — BASIC METABOLIC PANEL
Anion gap: 9 (ref 5–15)
BUN: 12 mg/dL (ref 8–23)
CO2: 22 mmol/L (ref 22–32)
Calcium: 9.2 mg/dL (ref 8.9–10.3)
Chloride: 103 mmol/L (ref 98–111)
Creatinine, Ser: 0.86 mg/dL (ref 0.61–1.24)
GFR calc Af Amer: >60 mL/min (ref >60)
GFR calc non Af Amer: >60 mL/min (ref >60)
Glucose, Bld: 123 mg/dL — ABNORMAL HIGH (ref 70–99)
Potassium: 3.7 mmol/L (ref 3.5–5.1)
Sodium: 134 mmol/L — ABNORMAL LOW (ref 135–145)

## 2020-05-25 NOTE — Progress Notes (Signed)
PROGRESS NOTE    David [name]  QBH:[id number] DOB: 27-Dec-1955 DOA: 05/24/2020 PCP: [name], MD     Brief Narrative:  64 y.o. male with medical history significant of hypertension, PTSD, bipolar disorder, chronic alcohol abuse, polysubstance abuse including marijuana and cocaine, presented with sudden onset of shortness of breath and confusion. Wife reports patient is not compliant with his blood pressure medications.  He was found to have systolic BP 097. Patient was admitted for Altered mental status.  Neurology was consulted for altered mental status. Recommended MRI and EEG. EEG is consistent with moderate diffuse encephalopathy,  no evidence of seizures noted. MRI was limited due to patient's movement but no acute stroke or acute abnormality noted. Altered mentation improved after improvement in Blood pressure.  Neurology signed off , recommended better BP control. Medication compliance discussed with patient in detail.   Assessment & Plan:   Active Problems:   Hypertensive urgency   Hypertensive emergency   Metabolic encephalopathy probably secondary to hypertensive encephalopathy >>> Improved. -Suspect patient not compliant with clonidine causing rebound hypertension emergency. -Stop clonidine and metoprolol (patient has history of cocaine abuse),   started amlodipine Coreg and ACEI, adjust according to response, add as needed hydralazine -UDS negative for cocaine but positive for amphetamines which might be from Adderall, hold Adderall for now. -CT head reassuring, neurology on board w - MRI: Negative for acute abnormality, EEG negative for seizures. -Patient was agitated in ED and received benzo plus Haldol.  Resume patient's bipolar medications. -Patient is alert oriented back to his baseline mental status. -Altered mentation was could be secondary to uncontrolled hypertension.  Asymptomatic bradycardia: We will hold Coreg tonight.   Acute diastolic  CHF: -Secondary HTN uncontrolled, adjust BP meds as above, Lasix IV x1 -Echocardiogram :  LVEF 60-65%  Alcohol abuse -No symptoms or signs of active withdrawal -CIWA protocol with as needed benzos  PTSD, bipolar disorder -Hold Adderall as above, continue other bipolar meds    DVT prophylaxis: Lovenox Code Status: Full Family Communication: discussed with aptient Disposition Plan:  Status is: Inpatient  Remains inpatient appropriate because:Hemodynamically unstable   Dispo: The patient is from: Home              Anticipated d/c is to: Home              Anticipated d/c date is: 1 day              Patient currently is not medically stable to d/c.   Consultants:   Neurology  Procedures: EEG Antimicrobials:  Anti-infectives (From admission, onward)   None     Subjective: Patient was seen and examined at bedside. No overnight events. Patient is alert, oriented. reports he is doing much better. Wants to be discharged home.  Objective: Vitals:   05/25/20 0801 05/25/20 1006 05/25/20 1245 05/25/20 1433  BP: 140/87 (!) 108/57 (!) 96/55   Pulse: (!) 58  (!) 55 (!) 55  Resp:   16   Temp:   97.8 F (36.6 C)   TempSrc:   Oral   SpO2:      Weight:      Height:        Intake/Output Summary (Last 24 hours) at 05/25/2020 1718 Last data filed at 05/25/2020 1330 Gross per 24 hour  Intake 606 ml  Output 950 ml  Net -344 ml   Filed Weights   05/24/20 1805 05/25/20 0551  Weight: 76 kg 76.7 kg    Examination:  General exam: Appears calm and comfortable  Respiratory system: Clear to auscultation. Respiratory effort normal. Cardiovascular system: S1 & S2 heard, RRR. No JVD, murmurs, rubs, gallops or clicks. No pedal edema. Gastrointestinal system: Abdomen is nondistended, soft and nontender. No organomegaly or masses felt. Normal bowel sounds heard. Central nervous system: Alert and oriented. No focal neurological deficits. Extremities: Symmetric 5 x 5 power. Skin:  No rashes, lesions or ulcers Psychiatry: Judgement and insight appear normal. Mood & affect appropriate.     Data Reviewed: I have personally reviewed following labs and imaging studies  CBC: Recent Labs  Lab 05/24/20 0955 05/24/20 1001 05/24/20 1013 05/24/20 1346  WBC  --  14.1*  --  14.3*  NEUTROABS  --  11.0*  --   --   HGB 15.0  15.0 14.1 14.6 14.2  HCT 44.0  44.0 43.8 43.0 42.6  MCV  --  89.0  --  86.4  PLT  --  364  --  086   Basic Metabolic Panel: Recent Labs  Lab 05/24/20 0955 05/24/20 1001 05/24/20 1013 05/24/20 1346 05/25/20 0755  NA 135  135 134* 135 136 134*  K 3.4*  3.3* 3.8 3.4* 3.4* 3.7  CL 99 99  --  102 103  CO2  --  23  --  19* 22  GLUCOSE 160* 164*  --  195* 123*  BUN 12 11  --  11 12  CREATININE 1.10 1.13  --  1.10 0.86  CALCIUM  --  9.5  --  9.4 9.2  MG  --   --   --  1.8  --   PHOS  --   --   --  3.3  --    GFR: Estimated Creatinine Clearance: 85.1 mL/min (by C-G formula based on SCr of 0.86 mg/dL). Liver Function Tests: Recent Labs  Lab 05/24/20 1001 05/24/20 1346  AST 32 30  ALT 20 18  ALKPHOS 101 99  BILITOT 0.8 0.4  PROT 6.6 7.2  ALBUMIN 3.5 3.7   Recent Labs  Lab 05/24/20 1001  LIPASE 25   Recent Labs  Lab 05/24/20 1002  AMMONIA 26   Coagulation Profile: Recent Labs  Lab 05/24/20 1001  INR 0.9   Cardiac Enzymes: No results for input(s): CKTOTAL, CKMB, CKMBINDEX, TROPONINI in the last 168 hours. BNP (last 3 results) No results for input(s): PROBNP in the last 8760 hours. HbA1C: No results for input(s): HGBA1C in the last 72 hours. CBG: No results for input(s): GLUCAP in the last 168 hours. Lipid Profile: No results for input(s): CHOL, HDL, LDLCALC, TRIG, CHOLHDL, LDLDIRECT in the last 72 hours. Thyroid Function Tests: No results for input(s): TSH, T4TOTAL, FREET4, T3FREE, THYROIDAB in the last 72 hours. Anemia Panel: No results for input(s): VITAMINB12, FOLATE, FERRITIN, TIBC, IRON, RETICCTPCT in the last  72 hours. Sepsis Labs: Recent Labs  Lab 05/24/20 1001  LATICACIDVEN 2.6*    Recent Results (from the past 240 hour(s))  SARS Coronavirus 2 by RT PCR (hospital order, performed in [facility] hospital lab) Nasopharyngeal Nasopharyngeal Swab     Status: None   Collection Time: 05/24/20  9:28 AM   Specimen: Nasopharyngeal Swab  Result Value Ref Range Status   SARS Coronavirus 2 NEGATIVE NEGATIVE Final    Comment: (NOTE) SARS-CoV-2 target nucleic acids are NOT DETECTED.  The SARS-CoV-2 RNA is generally detectable in upper and lower respiratory specimens during the acute phase of infection. The lowest concentration of SARS-CoV-2 viral copies this assay can detect  is 250 copies / mL. A negative result does not preclude SARS-CoV-2 infection and should not be used as the sole basis for treatment or other patient management decisions.  A negative result may occur with improper specimen collection / handling, submission of specimen other than nasopharyngeal swab, presence of viral mutation(s) within the areas targeted by this assay, and inadequate number of viral copies (<250 copies / mL). A negative result must be combined with clinical observations, patient history, and epidemiological information.  Fact Sheet for Patients:   StrictlyIdeas.no  Fact Sheet for Healthcare Providers: BankingDealers.co.za  This test is not yet approved or  cleared by the [place] FDA and has been authorized for detection and/or diagnosis of SARS-CoV-2 by FDA under an Emergency Use Authorization (EUA).  This EUA will remain in effect (meaning this test can be used) for the duration of the COVID-19 declaration under Section 564(b)(1) of the Act, 21 U.S.C. section 360bbb-3(b)(1), unless the authorization is terminated or revoked sooner.  Performed at [facility], [place] [address], [place], [place] [address]   Culture, blood (routine x 2)      Status: None (Preliminary result)   Collection Time: 05/24/20  9:50 AM   Specimen: BLOOD RIGHT ARM  Result Value Ref Range Status   Specimen Description BLOOD RIGHT ARM  Final   Special Requests   Final    BOTTLES DRAWN AEROBIC AND ANAEROBIC Blood Culture adequate volume   Culture   Final    NO GROWTH < 24 HOURS Performed at [facility], [place] [address] [place], [place] [address]    Report Status PENDING  Incomplete  Culture, blood (routine x 2)     Status: None (Preliminary result)   Collection Time: 05/24/20  9:54 AM   Specimen: BLOOD RIGHT HAND  Result Value Ref Range Status   Specimen Description BLOOD RIGHT HAND  Final   Special Requests   Final    BOTTLES DRAWN AEROBIC AND ANAEROBIC Blood Culture adequate volume   Culture   Final    NO GROWTH < 24 HOURS Performed at [facility], [place] [address], [place], [place] [address]    Report Status PENDING  Incomplete         Radiology Studies: EEG  Result Date: 05/24/2020 Lora Havens, MD     05/24/2020  3:37 PM Patient Name: DARONTE SHOSTAK MRN: [id number] Epilepsy Attending: Lora Havens Referring Physician/Provider: Etta Quill, PA Date: 05/24/2020 Duration:  25.16 mins Patient history: 64 year old male presenting to the hospital after having acute shortness of breath and altered mental status. EEG to evaluate for seizure. Level of alertness: Awake, drowsy AEDs during EEG study: Ativan Technical aspects: This EEG study was done with scalp electrodes positioned according to the 10-20 International system of electrode placement. Electrical activity was acquired at a sampling rate of 500Hz  and reviewed with a high frequency filter of 70Hz  and a low frequency filter of 1Hz . EEG data were recorded continuously and digitally stored. Description: No posterior dominant rhythm was seen. EEG showed continuous generalized polymorphic mixed frequencies 6-8Hz  theta-alpha activity as well as intermittent 13-15hz  beta activity in  frontocentral region. Hyperventilation and photic stimulation were not performed.   ABNORMALITY -Continuous slow, generalized IMPRESSION: This study is suggestive of moderate diffuse encephalopathy, nonspecific etiology. No seizures or epileptiform discharges were seen throughout the recording. Lora Havens   CT Head Wo Contrast  Result Date: 05/24/2020 CLINICAL DATA:  Mental status change. EXAM: CT HEAD  WITHOUT CONTRAST TECHNIQUE: Contiguous axial images were obtained from the base of the skull through the vertex without intravenous contrast. COMPARISON:  CT head 12/22/2019 FINDINGS: Brain: No evidence of acute large vascular territory infarction, acute hemorrhage, hydrocephalus, extra-axial collection or mass lesion/mass effect. Mild diffuse cerebral atrophy. Mild patchy white matter hypoattenuation, likely the sequela of chronic microvascular ischemic disease. Vascular: Calcific intracranial atherosclerosis. Skull: Normal. Negative for fracture or focal lesion. Sinuses/Orbits: Mild paranasal sinus mucosal thickening without air-fluid levels. No acute orbital abnormality. Other: No mastoid effusion. IMPRESSION: No acute intracranial abnormality. Electronically Signed   By: Margaretha Sheffield MD   On: 05/24/2020 10:41   MR BRAIN WO CONTRAST  Result Date: 05/24/2020 CLINICAL DATA:  Delirium EXAM: MRI HEAD WITHOUT CONTRAST TECHNIQUE: Multiplanar, multiecho pulse sequences of the brain and surrounding structures were obtained without intravenous contrast. COMPARISON:  None. FINDINGS: Study is limited by patient motion and was terminated early due to patient intolerance/confusion. Within this limitation: Brain: No acute infarction, hemorrhage, hydrocephalus, extra-axial collection or mass lesion. Mild periventricular T2/FLAIR hyperintensities, likely the sequela of chronic microvascular ischemic disease. Vascular: Limited evaluation the flow voids due to patient motion. Skull and upper cervical spine: Normal  marrow signal. Sinuses/Orbits: Mild scattered paranasal sinus mucosal thickening. No orbital mass. Other: No mastoid effusion. IMPRESSION: Study is limited by patient motion and was terminated early due to patient intolerance/confusion. Within this limitation, no evidence of acute intracranial abnormality. No acute infarct. Electronically Signed   By: Margaretha Sheffield MD   On: 05/24/2020 15:50   DG Chest Port 1 View  Result Date: 05/25/2020 CLINICAL DATA:  CHF. EXAM: PORTABLE CHEST 1 VIEW COMPARISON:  05/24/2020. FINDINGS: Mediastinum and hilar structures normal. Heart size normal. Improved bibasilar subsegmental atelectasis. No focal alveolar infiltrate. No pleural effusion or pneumothorax. No acute bony abnormality. IMPRESSION: Improved bibasilar subsegmental atelectasis. No focal alveolar infiltrate noted. Heart size normal. Electronically Signed   By: Marcello Moores  Register   On: 05/25/2020 06:40   DG Chest Port 1 View  Result Date: 05/24/2020 CLINICAL DATA:  Shortness of breath EXAM: PORTABLE CHEST 1 VIEW COMPARISON:  February 15, 2020 FINDINGS: There is atelectatic change in the lower lung regions. There is no edema or airspace opacity. Heart is upper normal in size with pulmonary vascularity normal. No adenopathy. No bone lesions. IMPRESSION: Bibasilar atelectasis. No edema or airspace opacity. Heart upper normal in size. Electronically Signed   By: Lowella Grip III M.D.   On: 05/24/2020 09:33   ECHOCARDIOGRAM COMPLETE  Result Date: 05/25/2020    ECHOCARDIOGRAM REPORT   Patient Name:   TAMARION HAYMOND Date of Exam: 05/25/2020 Medical Rec #:  [id number]      Height:       68.0 in Accession #:    [id number]     Weight:       169.1 lb Date of Birth:  [birth year]      BSA:          1.903 m Patient Age:    43 years       BP:           140/87 mmHg Patient Gender: M              HR:           58 bpm. Exam Location:  Inpatient Procedure: 2D Echo, Cardiac Doppler and Color Doppler Indications:    CHF  History:         Patient has no prior history of Echocardiogram examinations.  Signs/Symptoms:Shortness of Breath and Altered Mental Status;                 Risk Factors:Current Smoker and Hypertension. Polysubstance                 abuse, alcohol abuse, PTSD.  Sonographer:    Dustin Flock Referring Phys: [id number] [place] Comments: Technically difficult study due to poor echo windows. Image acquisition challenging due to respiratory motion. IMPRESSIONS  1. Left ventricular ejection fraction, by estimation, is 65 to 70%. The left ventricle has normal function. The left ventricle has no regional wall motion abnormalities. Left ventricular diastolic parameters are consistent with Grade I diastolic dysfunction (impaired relaxation).  2. Right ventricular systolic function is normal. The right ventricular size is normal.  3. The mitral valve is normal in structure. Trivial mitral valve regurgitation. No evidence of mitral stenosis.  4. The aortic valve is normal in structure. There is mild calcification of the aortic valve. There is mild thickening of the aortic valve. Aortic valve regurgitation is not visualized. FINDINGS  Left Ventricle: Left ventricular ejection fraction, by estimation, is 65 to 70%. The left ventricle has normal function. The left ventricle has no regional wall motion abnormalities. The left ventricular internal cavity size was normal in size. There is  borderline left ventricular hypertrophy. Left ventricular diastolic parameters are consistent with Grade I diastolic dysfunction (impaired relaxation). Right Ventricle: The right ventricular size is normal. No increase in right ventricular wall thickness. Right ventricular systolic function is normal. Left Atrium: Left atrial size was normal in size. Right Atrium: Right atrial size was normal in size. Pericardium: Trivial pericardial effusion is present. Mitral Valve: The mitral valve is normal in structure. Trivial mitral valve  regurgitation. No evidence of mitral valve stenosis. Tricuspid Valve: The tricuspid valve is grossly normal. Tricuspid valve regurgitation is trivial. Aortic Valve: The aortic valve is normal in structure. There is mild calcification of the aortic valve. There is mild thickening of the aortic valve. There is mild aortic valve annular calcification. Aortic valve regurgitation is not visualized. Pulmonic Valve: The pulmonic valve was normal in structure. Pulmonic valve regurgitation is not visualized. Aorta: The aortic root and ascending aorta are structurally normal, with no evidence of dilitation. IAS/Shunts: The atrial septum is grossly normal.  LEFT VENTRICLE PLAX 2D LVIDd:         4.70 cm  Diastology LVIDs:         2.90 cm  LV e' medial:    7.51 cm/s LV PW:         1.20 cm  LV E/e' medial:  9.4 LV IVS:        1.10 cm  LV e' lateral:   6.53 cm/s LVOT diam:     2.10 cm  LV E/e' lateral: 10.8 LV SV:         117 LV SV Index:   62 LVOT Area:     3.46 cm  RIGHT VENTRICLE RV Basal diam:  3.10 cm RV S prime:     12.50 cm/s TAPSE (M-mode): 2.7 cm LEFT ATRIUM           Index       RIGHT ATRIUM           Index LA diam:      4.00 cm 2.10 cm/m  RA Area:     12.60 cm LA Vol (A2C): 38.9 ml 20.44 ml/m RA Volume:   29.30 ml  15.40 ml/m LA  Vol (A4C): 71.8 ml 37.73 ml/m  AORTIC VALVE LVOT Vmax:   149.00 cm/s LVOT Vmean:  110.000 cm/s LVOT VTI:    0.339 m  AORTA Ao Root diam: 3.00 cm MITRAL VALVE MV Area (PHT): 2.54 cm    SHUNTS MV Decel Time: 299 msec    Systemic VTI:  0.34 m MV E velocity: 70.40 cm/s  Systemic Diam: 2.10 cm MV A velocity: 88.10 cm/s MV E/A ratio:  0.80 Mertie Moores MD Electronically signed by Mertie Moores MD Signature Date/Time: 05/25/2020/11:17:38 AM    Final         Scheduled Meds: . amLODipine  5 mg Oral Daily  . aspirin EC  81 mg Oral Daily  . carvedilol  3.125 mg Oral BID WC  . enoxaparin (LOVENOX) injection  40 mg Subcutaneous Q24H  . FLUoxetine  40 mg Oral Daily  . folic acid  1 mg Oral  Daily  . furosemide  20 mg Intravenous Once  . lisinopril  10 mg Oral Daily  . loratadine  10 mg Oral QHS  . multivitamin with minerals  1 tablet Oral Daily  . sodium chloride flush  3 mL Intravenous Q12H  . thiamine  100 mg Oral Daily   Or  . thiamine  100 mg Intravenous Daily  . zolpidem  10 mg Oral QHS   Continuous Infusions: . sodium chloride       LOS: 1 day    Time spent: 25 mins.    Shawna Clamp, MD [facility]   If 7PM-7AM, please contact night-coverage

## 2020-05-25 NOTE — Progress Notes (Signed)
  Echocardiogram 2D Echocardiogram has been performed.  David [name] 05/25/2020, 9:03 AM

## 2020-05-25 NOTE — Progress Notes (Signed)
CSW spoke with patient at bedside. CSW offered substance abuse outpatient treatment services resources. Patient accepted.  CSW will continue to follow for any other patient needs.

## 2020-05-25 NOTE — Progress Notes (Signed)
   05/24/20 2054  Assess: MEWS Score  Temp (!) 97.3 F (36.3 C)  BP (!) 174/75  Pulse Rate (!) 21  Resp 18  SpO2 100 %  O2 Device Nasal Cannula  O2 Flow Rate (L/min) 3 L/min  Assess: MEWS Score  MEWS Temp 0  MEWS Systolic 0  MEWS Pulse 2  MEWS RR 0  MEWS LOC 0  MEWS Score 2  MEWS Score Color Yellow  Assess: if the MEWS score is Yellow or Red  Were vital signs taken at a resting state? Yes  Focused Assessment Change from prior assessment (see assessment flowsheet)  Early Detection of Sepsis Score *See Row Information* Low  MEWS guidelines implemented *See Row Information* Yes  Treat  MEWS Interventions Escalated (See documentation below)  Pain Scale 0-10  Pain Score 0  Take Vital Signs  Increase Vital Sign Frequency  Yellow: Q 2hr X 2 then Q 4hr X 2, if remains yellow, continue Q 4hrs  Escalate  MEWS: Escalate Yellow: discuss with charge nurse/RN and consider discussing with provider and RRT  Notify: Charge Nurse/RN  Name of Charge Nurse/RN Notified [place] RN  Date Charge Nurse/RN Notified 05/24/20  Time Charge Nurse/RN Notified 2054  Document  Patient Outcome Other (Comment) (pulse rate 65)  Progress note created (see row info) Yes

## 2020-05-25 NOTE — Progress Notes (Signed)
Neurology Progress Note   Subjective: Patient seen and examined Wife available at bedside Reports improvement in terms of mentation significantly since yesterday. Blood pressures continue to fluctuate but for now are less than 140 over the last 2 or 3 readings.  O:// Current vital signs: BP (!) 108/57   Pulse (!) 58   Temp 98.3 F (36.8 C) (Oral)   Resp 18   Ht 5\' 8"  (1.727 m)   Wt 76.7 kg   SpO2 100%   BMI 25.71 kg/m  Vital signs in last 24 hours: Temp:  [97.1 F (36.2 C)-98.3 F (36.8 C)] 98.3 F (36.8 C) (09/10 0551) Pulse Rate:  [21-68] 58 (09/10 0801) Resp:  [10-30] 18 (09/10 0055) BP: (108-224)/(57-115) 108/57 (09/10 1006) SpO2:  [95 %-100 %] 100 % (09/10 0651) Weight:  [76 kg-76.7 kg] 76.7 kg (09/10 0551) Neurological exam Awake alert oriented x3 Speech is not dysarthric Naming comprehension repetition intact Fluency intact Follows all commands Cranial nerves: Pupils equal round react light, extraocular movements intact, visual fields full, face appears symmetric, facial sensation intact, auditory acuity intact, tongue and palate midline. Motor exam: Antigravity 5/5 in all fours Sensory exam: Intact light touch all over Coordination: Mild dysmetria on the left finger-nose-finger testing  Medications  Current Facility-Administered Medications:  .  0.9 %  sodium chloride infusion, 250 mL, Intravenous, PRN, Wynetta Fines T, MD .  acetaminophen (TYLENOL) tablet 650 mg, 650 mg, Oral, Q4H PRN, Wynetta Fines T, MD .  amLODipine (NORVASC) tablet 5 mg, 5 mg, Oral, Daily, Wynetta Fines T, MD, 5 mg at 05/25/20 1002 .  aspirin EC tablet 81 mg, 81 mg, Oral, Daily, Wynetta Fines T, MD, 81 mg at 05/25/20 1009 .  carvedilol (COREG) tablet 3.125 mg, 3.125 mg, Oral, BID WC, Wynetta Fines T, MD, 3.125 mg at 05/25/20 0801 .  cetaphil lotion 1 application, 1 application, Topical, PRN, Wynetta Fines T, MD .  enoxaparin (LOVENOX) injection 40 mg, 40 mg, Subcutaneous, Q24H, Wynetta Fines T, MD,  40 mg at 05/24/20 1333 .  FLUoxetine (PROZAC) capsule 40 mg, 40 mg, Oral, Daily, Wynetta Fines T, MD, 40 mg at 05/25/20 1001 .  folic acid (FOLVITE) tablet 1 mg, 1 mg, Oral, Daily, Wynetta Fines T, MD, 1 mg at 05/25/20 1002 .  furosemide (LASIX) injection 20 mg, 20 mg, Intravenous, Once, Wynetta Fines T, MD .  hydrALAZINE (APRESOLINE) injection 10 mg, 10 mg, Intravenous, Q4H PRN, Wynetta Fines T, MD, 10 mg at 05/25/20 0539 .  hydrOXYzine (ATARAX/VISTARIL) tablet 10 mg, 10 mg, Oral, TID PRN, Wynetta Fines T, MD .  lisinopril (ZESTRIL) tablet 10 mg, 10 mg, Oral, Daily, Wynetta Fines T, MD, 10 mg at 05/25/20 1002 .  loratadine (CLARITIN) tablet 10 mg, 10 mg, Oral, QHS, Wynetta Fines T, MD, 10 mg at 05/24/20 2148 .  LORazepam (ATIVAN) tablet 1-4 mg, 1-4 mg, Oral, Q1H PRN **OR** LORazepam (ATIVAN) injection 1-4 mg, 1-4 mg, Intravenous, Q1H PRN, Wynetta Fines T, MD, 4 mg at 05/24/20 2309 .  multivitamin with minerals tablet 1 tablet, 1 tablet, Oral, Daily, Wynetta Fines T, MD, 1 tablet at 05/25/20 1002 .  OLANZapine (ZYPREXA) tablet 5 mg, 5 mg, Oral, Daily PRN, Wynetta Fines T, MD .  ondansetron [facility]) injection 4 mg, 4 mg, Intravenous, Q6H PRN, Wynetta Fines T, MD .  ondansetron [facility]) tablet 4 mg, 4 mg, Oral, Q8H PRN, Wynetta Fines T, MD .  sodium chloride flush (NS) 0.9 % injection 3 mL, 3 mL, Intravenous, Q12H, Zhang, Ping T,  MD, 3 mL at 05/25/20 1009 .  sodium chloride flush (NS) 0.9 % injection 3 mL, 3 mL, Intravenous, PRN, Wynetta Fines T, MD .  thiamine tablet 100 mg, 100 mg, Oral, Daily, 100 mg at 05/25/20 1001 **OR** thiamine (B-1) injection 100 mg, 100 mg, Intravenous, Daily, Wynetta Fines T, MD .  zolpidem (AMBIEN) tablet 10 mg, 10 mg, Oral, Sanjuana Letters T, MD, 10 mg at 05/24/20 2148 Labs CBC    Component Value Date/Time   WBC 14.3 (H) 05/24/2020 1346   RBC 4.93 05/24/2020 1346   HGB 14.2 05/24/2020 1346   HCT 42.6 05/24/2020 1346   PLT 345 05/24/2020 1346   MCV 86.4 05/24/2020 1346   MCH 28.8  05/24/2020 1346   MCHC 33.3 05/24/2020 1346   RDW 15.1 05/24/2020 1346   LYMPHSABS 2.2 05/24/2020 1001   MONOABS 0.7 05/24/2020 1001   EOSABS 0.1 05/24/2020 1001   BASOSABS 0.1 05/24/2020 1001    CMP     Component Value Date/Time   NA 134 (L) 05/25/2020 0755   K 3.7 05/25/2020 0755   CL 103 05/25/2020 0755   CO2 22 05/25/2020 0755   GLUCOSE 123 (H) 05/25/2020 0755   BUN 12 05/25/2020 0755   CREATININE 0.86 05/25/2020 0755   CREATININE TEST NOT PERFORMED 09/03/2012 1527   CALCIUM 9.2 05/25/2020 0755   PROT 7.2 05/24/2020 1346   ALBUMIN 3.7 05/24/2020 1346   AST 30 05/24/2020 1346   ALT 18 05/24/2020 1346   ALKPHOS 99 05/24/2020 1346   BILITOT 0.4 05/24/2020 1346   GFRNONAA >60 05/25/2020 0755   GFRNONAA TEST NOT PERFORMED 09/03/2012 1527   GFRAA >60 05/25/2020 0755   GFRAA TEST NOT PERFORMED 09/03/2012 1527   Arterial blood gas yesterday with pH 7.386 and mild hypoxia with PO2 of 79.  EEG: Continuous generalized slowing.  Imaging I have reviewed images in epic and the results pertinent to this consultation are: MRI brain: Limited due to patient cooperation/confusion-no acute changes seen in the limited study. Does not appear to be consistent with posterior reversible encephalopathy syndrome. No acute stroke on the DWI sequence  Assessment: A 64-year-old man, presenting after having acute shortness of breath and altered mental status. Noted to have systolic blood pressure in the 220s. Appeared confused, but no other focal deficits noted. Neurological consultation for concern for hypertensive emergency and rule out stroke. MRI negative for stroke and for any evidence of posterior reversible encephalopathy syndrome. EEG with generalized slowing. No focal findings. With improving blood pressures, mentation improving.   Impression: Hypertensive urgency Imaging not suggestive of stroke or posterior reversible encephalopathy syndrome. And EEG not suggestive of focal  electrographic abnormalities or seizures.  Recommendations: Symptoms improving with blood pressure control-question of compliance with medications-reiterate medication compliance. Goal blood pressure upon discharge of 140/90 No further recommendations from inpatient neurology. Please call as needed.  -- Amie Portland, MD [facility] Pager: [phone] If 7pm to 7am, please call on call as listed on AMION.

## 2020-05-25 NOTE — Progress Notes (Addendum)
Pt's has been SB HR in upper 40th.  Pt is asleep in bed.  Held carvedilol this evening.  Idolina Primer, RN

## 2020-05-26 LAB — CBC
HCT: 38.8 % — ABNORMAL LOW (ref 39.0–52.0)
Hemoglobin: 12.7 g/dL — ABNORMAL LOW (ref 13.0–17.0)
MCH: 28.5 pg (ref 26.0–34.0)
MCHC: 32.7 g/dL (ref 30.0–36.0)
MCV: 87 fL (ref 80.0–100.0)
Platelets: 299 K/uL (ref 150–400)
RBC: 4.46 MIL/uL (ref 4.22–5.81)
RDW: 15.7 % — ABNORMAL HIGH (ref 11.5–15.5)
WBC: 10.5 K/uL (ref 4.0–10.5)
nRBC: 0 % (ref 0.0–0.2)

## 2020-05-26 LAB — BASIC METABOLIC PANEL
Anion gap: 10 (ref 5–15)
BUN: 16 mg/dL (ref 8–23)
CO2: 23 mmol/L (ref 22–32)
Calcium: 9 mg/dL (ref 8.9–10.3)
Chloride: 102 mmol/L (ref 98–111)
Creatinine, Ser: 1.07 mg/dL (ref 0.61–1.24)
GFR calc Af Amer: >60 mL/min (ref >60)
GFR calc non Af Amer: >60 mL/min (ref >60)
Glucose, Bld: 106 mg/dL — ABNORMAL HIGH (ref 70–99)
Potassium: 4.1 mmol/L (ref 3.5–5.1)
Sodium: 135 mmol/L (ref 135–145)

## 2020-05-26 LAB — MAGNESIUM: Magnesium: 1.8 mg/dL (ref 1.7–2.4)

## 2020-05-26 LAB — PHOSPHORUS: Phosphorus: 3.2 mg/dL (ref 2.5–4.6)

## 2020-05-26 NOTE — Progress Notes (Signed)
PROGRESS NOTE    David [name]  IFO:[id number] DOB: 1956/09/08 DOA: 05/24/2020 PCP: [name], MD     Brief Narrative:  64 y.o. male with medical history significant of hypertension, PTSD, bipolar disorder, chronic alcohol abuse, polysubstance abuse including marijuana and cocaine, presented with sudden onset of shortness of breath and confusion. Wife reports patient is not compliant with his blood pressure medications.  He was found to have systolic BP 676. Patient was admitted for Altered mental status.  Neurology was consulted for altered mental status. Recommended MRI and EEG. EEG is consistent with moderate diffuse encephalopathy,  no evidence of seizures noted. MRI was limited due to patient's movement but no acute stroke or acute abnormality noted. Altered mentation improved after improvement in Blood pressure.  Neurology signed off , recommended better BP control. Medication compliance discussed with patient in detail. His HR remains low, Coreg discontinued, discussed with cardiology wanted to observe for 24 hours if his heart rate continues to remain low,  needs evaluation.   Assessment & Plan:   Active Problems:   Hypertensive urgency   Hypertensive emergency   Metabolic encephalopathy probably secondary to hypertensive encephalopathy >>> Improved. -Suspect patient not compliant with clonidine causing rebound hypertension emergency. -Stop clonidine and metoprolol (patient has history of cocaine abuse),   started amlodipine Coreg and ACEI, adjust according to response, add as needed hydralazine -UDS negative for cocaine but positive for amphetamines which might be from Adderall, hold Adderall for now. -CT head reassuring, neurology on board w - MRI: Negative for acute abnormality, EEG negative for seizures. -Patient was agitated in ED and received benzo plus Haldol.  Resume patient's bipolar medications. -Patient is alert oriented back to his baseline mental  status. -Altered mentation was could be secondary to uncontrolled hypertension.  Asymptomatic bradycardia: We discontinued his Coreg but he continues to remain bradycardic EKG shows sinus bradycardia. Discussed with cardiology , wanted to observe for next 24 hours. Beta-blocker normally takes 48 hours to take out of her system.   Acute diastolic CHF: -Secondary HTN uncontrolled, adjust BP meds as above, Lasix IV x1 -Echocardiogram :  LVEF 60-65%  Alcohol abuse -No symptoms or signs of active withdrawal -CIWA protocol with as needed benzos  PTSD, bipolar disorder -Hold Adderall as above, continue other bipolar meds    DVT prophylaxis: Lovenox Code Status: Full Family Communication: discussed with aptient Disposition Plan:  Status is: Inpatient  Remains inpatient appropriate because:Hemodynamically unstable   Dispo: The patient is from: Home              Anticipated d/c is to: Home              Anticipated d/c date is: 1 day              Patient currently is not medically stable to d/c.   Consultants:   Neurology  Procedures: EEG Antimicrobials:  Anti-infectives (From admission, onward)   None     Subjective: Patient was seen and examined at bedside. Overnight events noted.  Patient reports feeling better. He denies any dizziness, chest pain, shortness of breath.  He feels upset when he heard,  he is not going to be discharged today.  His wife was at the bedside explained in detail about the heart rate.  Objective: Vitals:   05/26/20 0638 05/26/20 0838 05/26/20 1148 05/26/20 1223  BP: (!) 156/87 (!) 161/84 124/76   Pulse: (!) 50 (!) 49 (!) 50 (!) 52  Resp: 18 18  Temp: (!) 97.5 F (36.4 C) 98.3 F (36.8 C)    TempSrc: Oral Oral    SpO2: 99% 99%  100%  Weight: 79.3 kg     Height:        Intake/Output Summary (Last 24 hours) at 05/26/2020 1516 Last data filed at 05/26/2020 0641 Gross per 24 hour  Intake 120 ml  Output 1000 ml  Net -880 ml    Filed Weights   05/24/20 1805 05/25/20 0551 05/26/20 0638  Weight: 76 kg 76.7 kg 79.3 kg    Examination:  General exam: Appears calm and comfortable  Respiratory system: Clear to auscultation. Respiratory effort normal. Cardiovascular system: S1 & S2 heard, RRR. No JVD, murmurs, rubs, gallops or clicks. No pedal edema. Gastrointestinal system: Abdomen is nondistended, soft and nontender. No organomegaly or masses felt. Normal bowel sounds heard. Central nervous system: Alert and oriented. No focal neurological deficits. Extremities: Symmetric 5 x 5 power. Skin: No rashes, lesions or ulcers Psychiatry: Judgement and insight appear normal. Mood & affect appropriate.     Data Reviewed: I have personally reviewed following labs and imaging studies  CBC: Recent Labs  Lab 05/24/20 0955 05/24/20 1001 05/24/20 1013 05/24/20 1346 05/26/20 0438  WBC  --  14.1*  --  14.3* 10.5  NEUTROABS  --  11.0*  --   --   --   HGB 15.0  15.0 14.1 14.6 14.2 12.7*  HCT 44.0  44.0 43.8 43.0 42.6 38.8*  MCV  --  89.0  --  86.4 87.0  PLT  --  364  --  345 762   Basic Metabolic Panel: Recent Labs  Lab 05/24/20 0955 05/24/20 0955 05/24/20 1001 05/24/20 1013 05/24/20 1346 05/25/20 0755 05/26/20 0438  NA 135  135   < > 134* 135 136 134* 135  K 3.4*  3.3*   < > 3.8 3.4* 3.4* 3.7 4.1  CL 99  --  99  --  102 103 102  CO2  --   --  23  --  19* 22 23  GLUCOSE 160*  --  164*  --  195* 123* 106*  BUN 12  --  11  --  11 12 16   CREATININE 1.10  --  1.13  --  1.10 0.86 1.07  CALCIUM  --   --  9.5  --  9.4 9.2 9.0  MG  --   --   --   --  1.8  --  1.8  PHOS  --   --   --   --  3.3  --  3.2   < > = values in this interval not displayed.   GFR: Estimated Creatinine Clearance: 68.4 mL/min (by C-G formula based on SCr of 1.07 mg/dL). Liver Function Tests: Recent Labs  Lab 05/24/20 1001 05/24/20 1346  AST 32 30  ALT 20 18  ALKPHOS 101 99  BILITOT 0.8 0.4  PROT 6.6 7.2  ALBUMIN 3.5 3.7    Recent Labs  Lab 05/24/20 1001  LIPASE 25   Recent Labs  Lab 05/24/20 1002  AMMONIA 26   Coagulation Profile: Recent Labs  Lab 05/24/20 1001  INR 0.9   Cardiac Enzymes: No results for input(s): CKTOTAL, CKMB, CKMBINDEX, TROPONINI in the last 168 hours. BNP (last 3 results) No results for input(s): PROBNP in the last 8760 hours. HbA1C: No results for input(s): HGBA1C in the last 72 hours. CBG: No results for input(s): GLUCAP in the last 168 hours. Lipid Profile: No  results for input(s): CHOL, HDL, LDLCALC, TRIG, CHOLHDL, LDLDIRECT in the last 72 hours. Thyroid Function Tests: No results for input(s): TSH, T4TOTAL, FREET4, T3FREE, THYROIDAB in the last 72 hours. Anemia Panel: No results for input(s): VITAMINB12, FOLATE, FERRITIN, TIBC, IRON, RETICCTPCT in the last 72 hours. Sepsis Labs: Recent Labs  Lab 05/24/20 1001  LATICACIDVEN 2.6*    Recent Results (from the past 240 hour(s))  SARS Coronavirus 2 by RT PCR (hospital order, performed in [facility] hospital lab) Nasopharyngeal Nasopharyngeal Swab     Status: None   Collection Time: 05/24/20  9:28 AM   Specimen: Nasopharyngeal Swab  Result Value Ref Range Status   SARS Coronavirus 2 NEGATIVE NEGATIVE Final    Comment: (NOTE) SARS-CoV-2 target nucleic acids are NOT DETECTED.  The SARS-CoV-2 RNA is generally detectable in upper and lower respiratory specimens during the acute phase of infection. The lowest concentration of SARS-CoV-2 viral copies this assay can detect is 250 copies / mL. A negative result does not preclude SARS-CoV-2 infection and should not be used as the sole basis for treatment or other patient management decisions.  A negative result may occur with improper specimen collection / handling, submission of specimen other than nasopharyngeal swab, presence of viral mutation(s) within the areas targeted by this assay, and inadequate number of viral copies (<250 copies / mL). A negative result  must be combined with clinical observations, patient history, and epidemiological information.  Fact Sheet for Patients:   StrictlyIdeas.no  Fact Sheet for Healthcare Providers: BankingDealers.co.za  This test is not yet approved or  cleared by the [place] FDA and has been authorized for detection and/or diagnosis of SARS-CoV-2 by FDA under an Emergency Use Authorization (EUA).  This EUA will remain in effect (meaning this test can be used) for the duration of the COVID-19 declaration under Section 564(b)(1) of the Act, 21 U.S.C. section 360bbb-3(b)(1), unless the authorization is terminated or revoked sooner.  Performed at [facility], [place] [address] [place], [place] [address]   Culture, blood (routine x 2)     Status: None (Preliminary result)   Collection Time: 05/24/20  9:50 AM   Specimen: BLOOD RIGHT ARM  Result Value Ref Range Status   Specimen Description BLOOD RIGHT ARM  Final   Special Requests   Final    BOTTLES DRAWN AEROBIC AND ANAEROBIC Blood Culture adequate volume   Culture   Final    NO GROWTH 2 DAYS Performed at [facility], [address] [place], [place] [address]    Report Status PENDING  Incomplete  Culture, blood (routine x 2)     Status: None (Preliminary result)   Collection Time: 05/24/20  9:54 AM   Specimen: BLOOD RIGHT HAND  Result Value Ref Range Status   Specimen Description BLOOD RIGHT HAND  Final   Special Requests   Final    BOTTLES DRAWN AEROBIC AND ANAEROBIC Blood Culture adequate volume   Culture   Final    NO GROWTH 2 DAYS Performed at [facility], [place] [address], [place], [place] [address]    Report Status PENDING  Incomplete         Radiology Studies: EEG  Result Date: 05/24/2020 Lora Havens, MD     05/24/2020  3:37 PM Patient Name: KONGMENG SANTORO MRN: [id number] Epilepsy Attending: Lora Havens Referring Physician/Provider: Etta Quill, PA Date:  05/24/2020 Duration:  25.16 mins Patient history: 64 year old male presenting to the hospital after having  acute shortness of breath and altered mental status. EEG to evaluate for seizure. Level of alertness: Awake, drowsy AEDs during EEG study: Ativan Technical aspects: This EEG study was done with scalp electrodes positioned according to the 10-20 International system of electrode placement. Electrical activity was acquired at a sampling rate of 500Hz  and reviewed with a high frequency filter of 70Hz  and a low frequency filter of 1Hz . EEG data were recorded continuously and digitally stored. Description: No posterior dominant rhythm was seen. EEG showed continuous generalized polymorphic mixed frequencies 6-8Hz  theta-alpha activity as well as intermittent 13-15hz  beta activity in frontocentral region. Hyperventilation and photic stimulation were not performed.   ABNORMALITY -Continuous slow, generalized IMPRESSION: This study is suggestive of moderate diffuse encephalopathy, nonspecific etiology. No seizures or epileptiform discharges were seen throughout the recording. Lora Havens   MR BRAIN WO CONTRAST  Result Date: 05/24/2020 CLINICAL DATA:  Delirium EXAM: MRI HEAD WITHOUT CONTRAST TECHNIQUE: Multiplanar, multiecho pulse sequences of the brain and surrounding structures were obtained without intravenous contrast. COMPARISON:  None. FINDINGS: Study is limited by patient motion and was terminated early due to patient intolerance/confusion. Within this limitation: Brain: No acute infarction, hemorrhage, hydrocephalus, extra-axial collection or mass lesion. Mild periventricular T2/FLAIR hyperintensities, likely the sequela of chronic microvascular ischemic disease. Vascular: Limited evaluation the flow voids due to patient motion. Skull and upper cervical spine: Normal marrow signal. Sinuses/Orbits: Mild scattered paranasal sinus mucosal thickening. No orbital mass. Other: No mastoid effusion. IMPRESSION:  Study is limited by patient motion and was terminated early due to patient intolerance/confusion. Within this limitation, no evidence of acute intracranial abnormality. No acute infarct. Electronically Signed   By: Margaretha Sheffield MD   On: 05/24/2020 15:50   DG Chest Port 1 View  Result Date: 05/25/2020 CLINICAL DATA:  CHF. EXAM: PORTABLE CHEST 1 VIEW COMPARISON:  05/24/2020. FINDINGS: Mediastinum and hilar structures normal. Heart size normal. Improved bibasilar subsegmental atelectasis. No focal alveolar infiltrate. No pleural effusion or pneumothorax. No acute bony abnormality. IMPRESSION: Improved bibasilar subsegmental atelectasis. No focal alveolar infiltrate noted. Heart size normal. Electronically Signed   By: Marcello Moores  Register   On: 05/25/2020 06:40   ECHOCARDIOGRAM COMPLETE  Result Date: 05/25/2020    ECHOCARDIOGRAM REPORT   Patient Name:   GEMAYEL MASCIO Date of Exam: 05/25/2020 Medical Rec #:  [id number]      Height:       68.0 in Accession #:    [id number]     Weight:       169.1 lb Date of Birth:  1955-12-18      BSA:          1.903 m Patient Age:    31 years       BP:           140/87 mmHg Patient Gender: M              HR:           58 bpm. Exam Location:  Inpatient Procedure: 2D Echo, Cardiac Doppler and Color Doppler Indications:    CHF  History:        Patient has no prior history of Echocardiogram examinations.                 Signs/Symptoms:Shortness of Breath and Altered Mental Status;                 Risk Factors:Current Smoker and Hypertension. Polysubstance  abuse, alcohol abuse, PTSD.  Sonographer:    Dustin Flock Referring Phys: [id number] Media Comments: Technically difficult study due to poor echo windows. Image acquisition challenging due to respiratory motion. IMPRESSIONS  1. Left ventricular ejection fraction, by estimation, is 65 to 70%. The left ventricle has normal function. The left ventricle has no regional wall motion abnormalities.  Left ventricular diastolic parameters are consistent with Grade I diastolic dysfunction (impaired relaxation).  2. Right ventricular systolic function is normal. The right ventricular size is normal.  3. The mitral valve is normal in structure. Trivial mitral valve regurgitation. No evidence of mitral stenosis.  4. The aortic valve is normal in structure. There is mild calcification of the aortic valve. There is mild thickening of the aortic valve. Aortic valve regurgitation is not visualized. FINDINGS  Left Ventricle: Left ventricular ejection fraction, by estimation, is 65 to 70%. The left ventricle has normal function. The left ventricle has no regional wall motion abnormalities. The left ventricular internal cavity size was normal in size. There is  borderline left ventricular hypertrophy. Left ventricular diastolic parameters are consistent with Grade I diastolic dysfunction (impaired relaxation). Right Ventricle: The right ventricular size is normal. No increase in right ventricular wall thickness. Right ventricular systolic function is normal. Left Atrium: Left atrial size was normal in size. Right Atrium: Right atrial size was normal in size. Pericardium: Trivial pericardial effusion is present. Mitral Valve: The mitral valve is normal in structure. Trivial mitral valve regurgitation. No evidence of mitral valve stenosis. Tricuspid Valve: The tricuspid valve is grossly normal. Tricuspid valve regurgitation is trivial. Aortic Valve: The aortic valve is normal in structure. There is mild calcification of the aortic valve. There is mild thickening of the aortic valve. There is mild aortic valve annular calcification. Aortic valve regurgitation is not visualized. Pulmonic Valve: The pulmonic valve was normal in structure. Pulmonic valve regurgitation is not visualized. Aorta: The aortic root and ascending aorta are structurally normal, with no evidence of dilitation. IAS/Shunts: The atrial septum is grossly  normal.  LEFT VENTRICLE PLAX 2D LVIDd:         4.70 cm  Diastology LVIDs:         2.90 cm  LV e' medial:    7.51 cm/s LV PW:         1.20 cm  LV E/e' medial:  9.4 LV IVS:        1.10 cm  LV e' lateral:   6.53 cm/s LVOT diam:     2.10 cm  LV E/e' lateral: 10.8 LV SV:         117 LV SV Index:   62 LVOT Area:     3.46 cm  RIGHT VENTRICLE RV Basal diam:  3.10 cm RV S prime:     12.50 cm/s TAPSE (M-mode): 2.7 cm LEFT ATRIUM           Index       RIGHT ATRIUM           Index LA diam:      4.00 cm 2.10 cm/m  RA Area:     12.60 cm LA Vol (A2C): 38.9 ml 20.44 ml/m RA Volume:   29.30 ml  15.40 ml/m LA Vol (A4C): 71.8 ml 37.73 ml/m  AORTIC VALVE LVOT Vmax:   149.00 cm/s LVOT Vmean:  110.000 cm/s LVOT VTI:    0.339 m  AORTA Ao Root diam: 3.00 cm MITRAL VALVE MV Area (PHT): 2.54 cm  SHUNTS MV Decel Time: 299 msec    Systemic VTI:  0.34 m MV E velocity: 70.40 cm/s  Systemic Diam: 2.10 cm MV A velocity: 88.10 cm/s MV E/A ratio:  0.80 Mertie Moores MD Electronically signed by Mertie Moores MD Signature Date/Time: 05/25/2020/11:17:38 AM    Final         Scheduled Meds: . aspirin EC  81 mg Oral Daily  . enoxaparin (LOVENOX) injection  40 mg Subcutaneous Q24H  . FLUoxetine  40 mg Oral Daily  . folic acid  1 mg Oral Daily  . furosemide  20 mg Intravenous Once  . lisinopril  10 mg Oral Daily  . loratadine  10 mg Oral QHS  . multivitamin with minerals  1 tablet Oral Daily  . sodium chloride flush  3 mL Intravenous Q12H  . thiamine  100 mg Oral Daily   Or  . thiamine  100 mg Intravenous Daily  . zolpidem  10 mg Oral QHS   Continuous Infusions: . sodium chloride       LOS: 2 days    Time spent: 25 mins.    Shawna Clamp, MD [facility]   If 7PM-7AM, please contact night-coverage

## 2020-05-26 NOTE — Evaluation (Signed)
Physical Therapy Evaluation Patient Details Name: David [name] MRN: [id number] DOB: 01/17/1956 Today's Date: 05/26/2020   History of Present Illness  The pt is a 64 yo male presenting with acute SOB and AMS, systolic BP 086. Upon work up, all imaging clear of acute abnormalities. Ongoing workup, but suspected metabolic encephalopathy due to hypertensive encephalopathy. PMH includes: HTN, PTSD, bipolar disorder, CHF, alcohol abuse, and polysubstance abuse (marijuana and cocaine).    Clinical Impression  Pt in bed upon arrival of PT, agreeable to evaluation at this time. Prior to admission the pt was independent with all mobility and ADLs without use of AD. The pt lives at home with his wife, who works during the day. The pt now presents with limitations in functional mobility, activity tolerance, and dynamic stability due to above dx, and will continue to benefit from skilled PT to address these deficits. The pt was able to demo good bed mobility and initial transfers, but was limited by asymptomatic orthostatic hypotension with transition to sitting and standing. (see vitals below). The pt was able to complete a short ambulation with intermittent use of single UE support to steady, and will continue to benefit from skilled PT and generally mobility to improve stability and tolerance for activity.   VITALS:  - supine - BP: 124/76 (90); HR: 52bpm - sitting EOB - BP: 99/60 (73); HR: 55bpm - standing - BP: 76/52 (61); HR: 62bpm - standing after 3 min - BP: 81/64; HR: 55bpm - chair position 60 deg in bed- 102/68 (79); HR: 52bpm      Follow Up Recommendations No PT follow up;Supervision for mobility/OOB    Equipment Recommendations  None recommended by PT    Recommendations for Other Services       Precautions / Restrictions Precautions Precautions: Fall Precaution Comments: watch BP Restrictions Weight Bearing Restrictions: No      Mobility  Bed Mobility Overal bed mobility:  Modified Independent             General bed mobility comments: use of bed rail  Transfers Overall transfer level: Needs assistance Equipment used: None Transfers: Sit to/from Stand Sit to Stand: Supervision         General transfer comment: BP drop in sit and stand, asymptomatic. supervision for safety  Ambulation/Gait Ambulation/Gait assistance: Supervision Gait Distance (Feet): 20 Feet Assistive device: None Gait Pattern/deviations: Step-through pattern;Decreased stride length   Gait velocity interpretation: <1.8 ft/sec, indicate of risk for recurrent falls General Gait Details: slightly decreased stride lenght, ambulation in room limited due to drop in BP despite pt reporting asymptomatic.      Balance Overall balance assessment: Mild deficits observed, not formally tested                                           Pertinent Vitals/Pain Pain Assessment: No/denies pain    Home Living Family/patient expects to be discharged to:: Private residence Living Arrangements: Spouse/significant other Available Help at Discharge: Family;Available PRN/intermittently (wife works during the day) Type of Home: House Home Access: Level entry     [place]: [place]: Environmental consultant - 2 wheels;Cane - single point;Hand held shower head Additional Comments: has grab bar to install in bathroom    Prior Function Level of Independence: Independent         Comments: independent in home, driving, no use of AD  Hand Dominance   Dominant Hand: Right    Extremity/Trunk Assessment   Upper Extremity Assessment Upper Extremity Assessment: Overall WFL for tasks assessed    Lower Extremity Assessment Lower Extremity Assessment: Overall WFL for tasks assessed    Cervical / Trunk Assessment Cervical / Trunk Assessment: Normal  Communication   Communication: No difficulties  Cognition Arousal/Alertness: Awake/alert Behavior During Therapy:  Flat affect Overall Cognitive Status: Within Functional Limits for tasks assessed                                 General Comments: slightly slowed, asking apprioriate questions. decreased initiation possibly due to fatigue      General Comments General comments (skin integrity, edema, etc.): BP 124/76 upon arrival (pt in supine), drop to 99/60 in sitting, low of 72/52 in standing. pt remained asymptomatic. left in chair position in bed with BP 102/68. RN alerted    Exercises     Assessment/Plan    PT Assessment Patient needs continued PT services  PT Problem List Decreased mobility;Decreased safety awareness;Decreased activity tolerance;Decreased balance       PT Treatment Interventions Therapeutic exercise;Gait training;Balance training;Stair training;Functional mobility training;Patient/family education    PT Goals (Current goals can be found in the Care Plan section)  Acute Rehab PT Goals Patient Stated Goal: get some rest PT Goal Formulation: With patient Time For Goal Achievement: 06/09/20 Potential to Achieve Goals: Good    Frequency Min 3X/week   Barriers to discharge        M-PAC PT "6 Clicks" Mobility  Outcome Measure Help needed turning from your back to your side while in a flat bed without using bedrails?: None Help needed moving from lying on your back to sitting on the side of a flat bed without using bedrails?: None Help needed moving to and from a bed to a chair (including a wheelchair)?: A Little Help needed standing up from a chair using your arms (e.g., wheelchair or bedside chair)?: A Little Help needed to walk in hospital room?: A Little Help needed climbing 3-5 steps with a railing? : A Little 6 Click Score: 20    End of Session Equipment Utilized During Treatment: Gait belt Activity Tolerance: Patient tolerated treatment well Patient left: in bed;with call bell/phone within reach Nurse Communication: Mobility status (BP with OOB  mobility) PT Visit Diagnosis: Difficulty in walking, not elsewhere classified (R26.2)    Time: 9381-8299 PT Time Calculation (min) (ACUTE ONLY): 35 min   Charges:   PT Evaluation $PT Eval Moderate Complexity: 1 Mod PT Treatments $Gait Training: 8-22 mins        Karma Ganja, PT, DPT   Acute Rehabilitation Department Pager #: [phone]  Otho Bellows 05/26/2020, 12:33 PM

## 2020-05-27 DIAGNOSIS — R001 Bradycardia, unspecified: Secondary | ICD-10-CM

## 2020-05-27 DIAGNOSIS — F1910 Other psychoactive substance abuse, uncomplicated: Secondary | ICD-10-CM

## 2020-05-27 DIAGNOSIS — I161 Hypertensive emergency: Principal | ICD-10-CM

## 2020-05-27 LAB — BASIC METABOLIC PANEL
Anion gap: 9 (ref 5–15)
BUN: 14 mg/dL (ref 8–23)
CO2: 22 mmol/L (ref 22–32)
Calcium: 9 mg/dL (ref 8.9–10.3)
Chloride: 106 mmol/L (ref 98–111)
Creatinine, Ser: 1.06 mg/dL (ref 0.61–1.24)
GFR calc Af Amer: >60 mL/min (ref >60)
GFR calc non Af Amer: >60 mL/min (ref >60)
Glucose, Bld: 95 mg/dL (ref 70–99)
Potassium: 3.8 mmol/L (ref 3.5–5.1)
Sodium: 137 mmol/L (ref 135–145)

## 2020-05-27 NOTE — Discharge Instructions (Signed)
Advised to follow-up with primary care physician in 1 week. His blood pressure medication has been discontinued. Advised primary care physician to reinitiate his blood pressure medication. Advised to avoid beta-blockers and calcium channel blockers given significant bradycardia. Advised to avoid amphetamines for now until blood pressure stabilizes.

## 2020-05-27 NOTE — Discharge Summary (Signed)
Physician Discharge Summary  David [name] BHA:[id number] DOB: [birth year] DOA: 05/24/2020  PCP: David Noon, MD  Admit date: 05/24/2020  Discharge date: 05/27/2020  Admitted From:  Home.  Disposition:   Home  Recommendations for Outpatient Follow-up:  1. Follow up with PCP in 2-3 days. 2. Please obtain BMP/CBC in one week. 3. His blood pressure medication has been discontinued. 4. Advised primary care physician to reinitiate his blood pressure medication. 5. Advised to avoid beta-blockers and calcium channel blockers given significant bradycardia. 6. Advised to avoid amphetamines for now until blood pressure stabilizes.  Home Health: None.  Equipment/Devices: None.  Discharge Condition: Stable CODE STATUS:Full code Diet recommendation: Heart Healthy  Brief Summary / Hospital course: 64 y.o. male with medical history significant of hypertension, PTSD, bipolar disorder, chronic alcohol abuse, polysubstance abuse including marijuana and cocaine, presented with sudden onset of shortness of breath and confusion. Wife reports patient is not compliant with his blood pressure medications.  Patient used to take clonidine and metoprolol at home, assuming this could be rebound hypertension from clonidine.  Metoprolol was discontinued given history of cocaine use in the past. He was found to have systolic BP 973/532. Patient was admitted for Altered mental status. He was started on Coreg, amlodipine and lisinopril.  Neurology was consulted for altered mental status. Recommended MRI and EEG. EEG is consistent with moderate diffuse encephalopathy,  No evidence of seizures noted. MRI was limited due to patient's movement but no acute stroke or acute abnormality noted. Altered mentation improved after improvement in Blood pressure.  Neurology signed off , recommended better BP control. Medication compliance discussed with patient in detail.  He developed sinus bradycardia, Coreg and amlodipine was  discontinued but heart rate remains low.  Cardiology consulted recommended Patient can be discharged on no blood pressure medications,  primary care physician has to reinitiate blood pressure medications.  Advised to avoid Adderall for now.    He was managed for below problems.  Discharge Diagnoses:  Active Problems:   Hypertensive urgency   Hypertensive emergency  Metabolic encephalopathy probably secondary to hypertensive encephalopathy >>> Improved. -Suspect patient not compliant with clonidine causing rebound hypertension emergency. -Stop clonidine and metoprolol (patient has history of cocaine abuse),   started amlodipine Coreg and ACEI, adjust according to response, add as needed hydralazine -UDS negative for cocaine but positive for amphetamines which might be from Adderall, hold Adderall for now. -CT head reassuring, neurology on board - MRI: Negative for acute abnormality, EEG negative for seizures. -Patient was agitated in ED and received benzo plus Haldol.  Resume patient's bipolar medications. -Patient is alert oriented back to his baseline mental status. -Altered mentation was could be secondary to uncontrolled hypertension.   Asymptomatic bradycardia: We discontinued his Coreg but he continues to remain bradycardic EKG shows sinus bradycardia. Discussed with cardiology , wanted to observe for next 24 hours. Beta-blocker normally takes 48 hours to take out of her system. Patient can be discharged.  Avoid AV blocking medications,  beta-blockers and calcium channel blockers. Primary care physician has to reinitiate blood pressure medications.     Acute diastolic CHF: -Secondary HTN uncontrolled, adjust BP meds as above, Lasix IV x1 -Echocardiogram :  LVEF 60-65%   Alcohol abuse -No symptoms or signs of active withdrawal -CIWA protocol with as needed benzos   PTSD, bipolar disorder -Hold Adderall as above, continue other bipolar meds    Discharge  Instructions  Discharge Instructions     Call MD for:  difficulty breathing, headache  or visual disturbances   Complete by: As directed    Call MD for:  persistant dizziness or light-headedness   Complete by: As directed    Call MD for:  persistant nausea and vomiting   Complete by: As directed    Diet - low sodium heart healthy   Complete by: As directed    Diet Carb Modified   Complete by: As directed    Discharge instructions   Complete by: As directed    Advised to follow-up with primary care physician in 1 week. His blood pressure medication has been discontinued. Advised primary care physician to reinitiate his blood pressure medication. Advised to avoid beta-blockers and calcium channel blockers given significant bradycardia. Advised to avoid amphetamines for now until blood pressure stabilizes.   Increase activity slowly   Complete by: As directed       Allergies as of 05/27/2020       Reactions   Codeine Nausea Only        Medication List     STOP taking these medications    amphetamine-dextroamphetamine 30 MG tablet Commonly known as: ADDERALL   cephALEXin 500 MG capsule Commonly known as: Keflex   cloNIDine 0.3 MG tablet Commonly known as: CATAPRES   doxycycline 100 MG tablet Commonly known as: VIBRA-TABS   metoprolol succinate 100 MG 24 hr tablet Commonly known as: TOPROL-XL       TAKE these medications    cetaphil lotion Apply 1 application topically as needed for dry skin.   Dupixent 300 MG/2ML prefilled syringe Generic drug: dupilumab Inject 300 mg into the skin every 14 (fourteen) days.   FLUoxetine 20 MG capsule Commonly known as: PROZAC TAKE 2 CAPSULES(40 MG) BY MOUTH DAILY What changed: See the new instructions.   hydrOXYzine 10 MG tablet Commonly known as: ATARAX/VISTARIL TAKE 1 TABLET(10 MG) BY MOUTH THREE TIMES DAILY AS NEEDED What changed: See the new instructions.   levocetirizine 5 MG tablet Commonly known as:  XYZAL Take 5 mg by mouth at bedtime as needed for allergies or itching.   OLANZapine 10 MG tablet Commonly known as: ZYPREXA TAKE 1/2 TO 1 TABLET BY MOUTH DAILY AS NEEDED FOR AGITATION OR ANXIETY What changed:   how much to take  how to take this  when to take this  reasons to take this  additional instructions   ondansetron 4 MG tablet Commonly known as: ZOFRAN TAKE 1 TABLET(4 MG) BY MOUTH EVERY 8 HOURS AS NEEDED FOR NAUSEA OR VOMITING What changed: See the new instructions.   pregabalin 150 MG capsule Commonly known as: LYRICA Take 1 capsule by mouth twice daily Start taking on: May 28, 2020 What changed:   how much to take  how to take this  when to take this  additional instructions   triamcinolone ointment 0.1 % Commonly known as: KENALOG Apply 1 application topically See admin instructions. Apply to affected area twice daily for 2 weeks on then 1 week off as needed for flare ups   zolpidem 10 MG tablet Commonly known as: AMBIEN TAKE 1 AND 1/2 TABLETS(15 MG) BY MOUTH AT BEDTIME What changed:   how much to take  how to take this  when to take this  additional instructions        Follow-up Information     David Noon, MD Follow up in 3 day(s).   Specialty: Family Medicine Contact information: [address] [place] [phone]  Allergies  Allergen Reactions  . Codeine Nausea Only    Consultations:  Cardiology, Neurology   Procedures/Studies: . EEG  Result Date: 05/24/2020 Lora Havens, MD     05/24/2020  3:37 PM Patient Name: KEIAN ODRISCOLL MRN: [id number] Epilepsy Attending: Lora Havens Referring Physician/Provider: Etta Quill, PA Date: 05/24/2020 Duration:  25.16 mins Patient history: 64 year old male presenting to the hospital after having acute shortness of breath and altered mental status. EEG to evaluate for seizure. Level of alertness: Awake, drowsy AEDs during EEG study:  Ativan Technical aspects: This EEG study was done with scalp electrodes positioned according to the 10-20 International system of electrode placement. Electrical activity was acquired at a sampling rate of 500Hz  and reviewed with a high frequency filter of 70Hz  and a low frequency filter of 1Hz . EEG data were recorded continuously and digitally stored. Description: No posterior dominant rhythm was seen. EEG showed continuous generalized polymorphic mixed frequencies 6-8Hz  theta-alpha activity as well as intermittent 13-15hz  beta activity in frontocentral region. Hyperventilation and photic stimulation were not performed.   ABNORMALITY -Continuous slow, generalized IMPRESSION: This study is suggestive of moderate diffuse encephalopathy, nonspecific etiology. No seizures or epileptiform discharges were seen throughout the recording. Lora Havens   CT Head Wo Contrast  Result Date: 05/24/2020 CLINICAL DATA:  Mental status change. EXAM: CT HEAD WITHOUT CONTRAST TECHNIQUE: Contiguous axial images were obtained from the base of the skull through the vertex without intravenous contrast. COMPARISON:  CT head 12/22/2019 FINDINGS: Brain: No evidence of acute large vascular territory infarction, acute hemorrhage, hydrocephalus, extra-axial collection or mass lesion/mass effect. Mild diffuse cerebral atrophy. Mild patchy white matter hypoattenuation, likely the sequela of chronic microvascular ischemic disease. Vascular: Calcific intracranial atherosclerosis. Skull: Normal. Negative for fracture or focal lesion. Sinuses/Orbits: Mild paranasal sinus mucosal thickening without air-fluid levels. No acute orbital abnormality. Other: No mastoid effusion. IMPRESSION: No acute intracranial abnormality. Electronically Signed   By: Margaretha Sheffield MD   On: 05/24/2020 10:41   MR BRAIN WO CONTRAST  Result Date: 05/24/2020 CLINICAL DATA:  Delirium EXAM: MRI HEAD WITHOUT CONTRAST TECHNIQUE: Multiplanar, multiecho pulse sequences  of the brain and surrounding structures were obtained without intravenous contrast. COMPARISON:  None. FINDINGS: Study is limited by patient motion and was terminated early due to patient intolerance/confusion. Within this limitation: Brain: No acute infarction, hemorrhage, hydrocephalus, extra-axial collection or mass lesion. Mild periventricular T2/FLAIR hyperintensities, likely the sequela of chronic microvascular ischemic disease. Vascular: Limited evaluation the flow voids due to patient motion. Skull and upper cervical spine: Normal marrow signal. Sinuses/Orbits: Mild scattered paranasal sinus mucosal thickening. No orbital mass. Other: No mastoid effusion. IMPRESSION: Study is limited by patient motion and was terminated early due to patient intolerance/confusion. Within this limitation, no evidence of acute intracranial abnormality. No acute infarct. Electronically Signed   By: Margaretha Sheffield MD   On: 05/24/2020 15:50   DG Chest Port 1 View  Result Date: 05/25/2020 CLINICAL DATA:  CHF. EXAM: PORTABLE CHEST 1 VIEW COMPARISON:  05/24/2020. FINDINGS: Mediastinum and hilar structures normal. Heart size normal. Improved bibasilar subsegmental atelectasis. No focal alveolar infiltrate. No pleural effusion or pneumothorax. No acute bony abnormality. IMPRESSION: Improved bibasilar subsegmental atelectasis. No focal alveolar infiltrate noted. Heart size normal. Electronically Signed   By: Marcello Moores  Register   On: 05/25/2020 06:40   DG Chest Port 1 View  Result Date: 05/24/2020 CLINICAL DATA:  Shortness of breath EXAM: PORTABLE CHEST 1 VIEW COMPARISON:  February 15, 2020 FINDINGS: There is atelectatic change  in the lower lung regions. There is no edema or airspace opacity. Heart is upper normal in size with pulmonary vascularity normal. No adenopathy. No bone lesions. IMPRESSION: Bibasilar atelectasis. No edema or airspace opacity. Heart upper normal in size. Electronically Signed   By: Lowella Grip III M.D.    On: 05/24/2020 09:33   ECHOCARDIOGRAM COMPLETE  Result Date: 05/25/2020    ECHOCARDIOGRAM REPORT   Patient Name:   TIARA BARTOLI Date of Exam: 05/25/2020 Medical Rec #:  [id number]      Height:       68.0 in Accession #:    [id number]     Weight:       169.1 lb Date of Birth:  [birth year]      BSA:          1.903 m Patient Age:    64 years       BP:           140/87 mmHg Patient Gender: M              HR:           58 bpm. Exam Location:  Inpatient Procedure: 2D Echo, Cardiac Doppler and Color Doppler Indications:    CHF  History:        Patient has no prior history of Echocardiogram examinations.                 Signs/Symptoms:Shortness of Breath and Altered Mental Status;                 Risk Factors:Current Smoker and Hypertension. Polysubstance                 abuse, alcohol abuse, PTSD.  Sonographer:    Dustin Flock Referring Phys: [id number] [place] Comments: Technically difficult study due to poor echo windows. Image acquisition challenging due to respiratory motion. IMPRESSIONS  1. Left ventricular ejection fraction, by estimation, is 65 to 70%. The left ventricle has normal function. The left ventricle has no regional wall motion abnormalities. Left ventricular diastolic parameters are consistent with Grade I diastolic dysfunction (impaired relaxation).  2. Right ventricular systolic function is normal. The right ventricular size is normal.  3. The mitral valve is normal in structure. Trivial mitral valve regurgitation. No evidence of mitral stenosis.  4. The aortic valve is normal in structure. There is mild calcification of the aortic valve. There is mild thickening of the aortic valve. Aortic valve regurgitation is not visualized. FINDINGS  Left Ventricle: Left ventricular ejection fraction, by estimation, is 65 to 70%. The left ventricle has normal function. The left ventricle has no regional wall motion abnormalities. The left ventricular internal cavity size was normal in size.  There is  borderline left ventricular hypertrophy. Left ventricular diastolic parameters are consistent with Grade I diastolic dysfunction (impaired relaxation). Right Ventricle: The right ventricular size is normal. No increase in right ventricular wall thickness. Right ventricular systolic function is normal. Left Atrium: Left atrial size was normal in size. Right Atrium: Right atrial size was normal in size. Pericardium: Trivial pericardial effusion is present. Mitral Valve: The mitral valve is normal in structure. Trivial mitral valve regurgitation. No evidence of mitral valve stenosis. Tricuspid Valve: The tricuspid valve is grossly normal. Tricuspid valve regurgitation is trivial. Aortic Valve: The aortic valve is normal in structure. There is mild calcification of the aortic valve. There is mild thickening of the aortic valve. There is mild aortic valve annular calcification.  Aortic valve regurgitation is not visualized. Pulmonic Valve: The pulmonic valve was normal in structure. Pulmonic valve regurgitation is not visualized. Aorta: The aortic root and ascending aorta are structurally normal, with no evidence of dilitation. IAS/Shunts: The atrial septum is grossly normal.  LEFT VENTRICLE PLAX 2D LVIDd:         4.70 cm  Diastology LVIDs:         2.90 cm  LV e' medial:    7.51 cm/s LV PW:         1.20 cm  LV E/e' medial:  9.4 LV IVS:        1.10 cm  LV e' lateral:   6.53 cm/s LVOT diam:     2.10 cm  LV E/e' lateral: 10.8 LV SV:         117 LV SV Index:   62 LVOT Area:     3.46 cm  RIGHT VENTRICLE RV Basal diam:  3.10 cm RV S prime:     12.50 cm/s TAPSE (M-mode): 2.7 cm LEFT ATRIUM           Index       RIGHT ATRIUM           Index LA diam:      4.00 cm 2.10 cm/m  RA Area:     12.60 cm LA Vol (A2C): 38.9 ml 20.44 ml/m RA Volume:   29.30 ml  15.40 ml/m LA Vol (A4C): 71.8 ml 37.73 ml/m  AORTIC VALVE LVOT Vmax:   149.00 cm/s LVOT Vmean:  110.000 cm/s LVOT VTI:    0.339 m  AORTA Ao Root diam: 3.00 cm MITRAL  VALVE MV Area (PHT): 2.54 cm    SHUNTS MV Decel Time: 299 msec    Systemic VTI:  0.34 m MV E velocity: 70.40 cm/s  Systemic Diam: 2.10 cm MV A velocity: 88.10 cm/s MV E/A ratio:  0.80 Mertie Moores MD Electronically signed by Mertie Moores MD Signature Date/Time: 05/25/2020/11:17:38 AM    Final     Echocardiogram   Subjective: Patient was seen and examined at bedside.  No overnight events.  His heart rate remains between 50s and 60s.  Improving but is still low.   Patient denies any symptoms.  Patient wants to be discharged home.  He has a lot of bills to pay and wants to be discharged.  Cleared from cardiology.  Discharge Exam: Vitals:   05/27/20 0427 05/27/20 0930  BP: 108/68 (!) 93/59  Pulse: (!) 55   Resp: 20   Temp: 98.7 F (37.1 C)   SpO2: 99%    Vitals:   05/26/20 1944 05/27/20 0009 05/27/20 0427 05/27/20 0930  BP: 110/88 112/68 108/68 (!) 93/59  Pulse: 65 (!) 54 (!) 55   Resp: 18 18 20    Temp: 98 F (36.7 C) 98.7 F (37.1 C) 98.7 F (37.1 C)   TempSrc: Oral Oral Oral   SpO2: 99% 99% 99%   Weight:   75.7 kg   Height:        General: Pt is alert, awake, not in acute distress Cardiovascular: RRR, S1/S2 +, no rubs, no gallops Respiratory: CTA bilaterally, no wheezing, no rhonchi Abdominal: Soft, NT, ND, bowel sounds + Extremities: no edema, no cyanosis    The results of significant diagnostics from this hospitalization (including imaging, microbiology, ancillary and laboratory) are listed below for reference.     Microbiology: Recent Results (from the past 240 hour(s))  SARS Coronavirus 2 by RT PCR (hospital order, performed  in [place] lab) Nasopharyngeal Nasopharyngeal Swab     Status: None   Collection Time: 05/24/20  9:28 AM   Specimen: Nasopharyngeal Swab  Result Value Ref Range Status   SARS Coronavirus 2 NEGATIVE NEGATIVE Final    Comment: (NOTE) SARS-CoV-2 target nucleic acids are NOT DETECTED.  The SARS-CoV-2 RNA is generally detectable  in upper and lower respiratory specimens during the acute phase of infection. The lowest concentration of SARS-CoV-2 viral copies this assay can detect is 250 copies / mL. A negative result does not preclude SARS-CoV-2 infection and should not be used as the sole basis for treatment or other patient management decisions.  A negative result may occur with improper specimen collection / handling, submission of specimen other than nasopharyngeal swab, presence of viral mutation(s) within the areas targeted by this assay, and inadequate number of viral copies (<250 copies / mL). A negative result must be combined with clinical observations, patient history, and epidemiological information.  Fact Sheet for Patients:   StrictlyIdeas.no  Fact Sheet for Healthcare Providers: BankingDealers.co.za  This test is not yet approved or  cleared by the [place] FDA and has been authorized for detection and/or diagnosis of SARS-CoV-2 by FDA under an Emergency Use Authorization (EUA).  This EUA will remain in effect (meaning this test can be used) for the duration of the COVID-19 declaration under Section 564(b)(1) of the Act, 21 U.S.C. section 360bbb-3(b)(1), unless the authorization is terminated or revoked sooner.  Performed at [facility], [place] [address], [place], [place] [address]   Culture, blood (routine x 2)     Status: None (Preliminary result)   Collection Time: 05/24/20  9:50 AM   Specimen: BLOOD RIGHT ARM  Result Value Ref Range Status   Specimen Description BLOOD RIGHT ARM  Final   Special Requests   Final    BOTTLES DRAWN AEROBIC AND ANAEROBIC Blood Culture adequate volume   Culture   Final    NO GROWTH 2 DAYS Performed at [facility], [address] [place], [place] [address]    Report Status PENDING  Incomplete  Culture, blood (routine x 2)     Status: None (Preliminary result)   Collection Time: 05/24/20  9:54  AM   Specimen: BLOOD RIGHT HAND  Result Value Ref Range Status   Specimen Description BLOOD RIGHT HAND  Final   Special Requests   Final    BOTTLES DRAWN AEROBIC AND ANAEROBIC Blood Culture adequate volume   Culture   Final    NO GROWTH 2 DAYS Performed at [facility], [place] [address] [place], [place] [address]    Report Status PENDING  Incomplete     Labs: BNP (last 3 results) Recent Labs    05/24/20 1001  BNP 010.2*   Basic Metabolic Panel: Recent Labs  Lab 05/24/20 1001 05/24/20 1001 05/24/20 1013 05/24/20 1346 05/25/20 0755 05/26/20 0438 05/27/20 0520  NA 134*   < > 135 136 134* 135 137  K 3.8   < > 3.4* 3.4* 3.7 4.1 3.8  CL 99  --   --  102 103 102 106  CO2 23  --   --  19* 22 23 22   GLUCOSE 164*  --   --  195* 123* 106* 95  BUN 11  --   --  11 12 16 14   CREATININE 1.13  --   --  1.10 0.86 1.07 1.06  CALCIUM 9.5  --   --  9.4 9.2 9.0 9.0  MG  --   --   --  1.8  --  1.8  --   PHOS  --   --   --  3.3  --  3.2  --    < > = values in this interval not displayed.   Liver Function Tests: Recent Labs  Lab 05/24/20 1001 05/24/20 1346  AST 32 30  ALT 20 18  ALKPHOS 101 99  BILITOT 0.8 0.4  PROT 6.6 7.2  ALBUMIN 3.5 3.7   Recent Labs  Lab 05/24/20 1001  LIPASE 25   Recent Labs  Lab 05/24/20 1002  AMMONIA 26   CBC: Recent Labs  Lab 05/24/20 0955 05/24/20 1001 05/24/20 1013 05/24/20 1346 05/26/20 0438  WBC  --  14.1*  --  14.3* 10.5  NEUTROABS  --  11.0*  --   --   --   HGB 15.0  15.0 14.1 14.6 14.2 12.7*  HCT 44.0  44.0 43.8 43.0 42.6 38.8*  MCV  --  89.0  --  86.4 87.0  PLT  --  364  --  345 299   Cardiac Enzymes: No results for input(s): CKTOTAL, CKMB, CKMBINDEX, TROPONINI in the last 168 hours. BNP: Invalid input(s): POCBNP CBG: No results for input(s): GLUCAP in the last 168 hours. D-Dimer No results for input(s): DDIMER in the last 72 hours. Hgb A1c No results for input(s): HGBA1C in the last 72 hours. Lipid Profile No  results for input(s): CHOL, HDL, LDLCALC, TRIG, CHOLHDL, LDLDIRECT in the last 72 hours. Thyroid function studies No results for input(s): TSH, T4TOTAL, T3FREE, THYROIDAB in the last 72 hours.  Invalid input(s): FREET3 Anemia work up No results for input(s): VITAMINB12, FOLATE, FERRITIN, TIBC, IRON, RETICCTPCT in the last 72 hours. Urinalysis    Component Value Date/Time   COLORURINE STRAW (A) 05/24/2020 1230   APPEARANCEUR CLEAR 05/24/2020 1230   LABSPEC 1.005 05/24/2020 1230   PHURINE 6.0 05/24/2020 1230   GLUCOSEU NEGATIVE 05/24/2020 1230   HGBUR NEGATIVE 05/24/2020 1230   BILIRUBINUR NEGATIVE 05/24/2020 1230   KETONESUR NEGATIVE 05/24/2020 1230   PROTEINUR NEGATIVE 05/24/2020 1230   UROBILINOGEN 0.2 04/14/2009 0446   NITRITE NEGATIVE 05/24/2020 1230   LEUKOCYTESUR NEGATIVE 05/24/2020 1230   Sepsis Labs Invalid input(s): PROCALCITONIN,  WBC,  LACTICIDVEN Microbiology Recent Results (from the past 240 hour(s))  SARS Coronavirus 2 by RT PCR (hospital order, performed in [place] hospital lab) Nasopharyngeal Nasopharyngeal Swab     Status: None   Collection Time: 05/24/20  9:28 AM   Specimen: Nasopharyngeal Swab  Result Value Ref Range Status   SARS Coronavirus 2 NEGATIVE NEGATIVE Final    Comment: (NOTE) SARS-CoV-2 target nucleic acids are NOT DETECTED.  The SARS-CoV-2 RNA is generally detectable in upper and lower respiratory specimens during the acute phase of infection. The lowest concentration of SARS-CoV-2 viral copies this assay can detect is 250 copies / mL. A negative result does not preclude SARS-CoV-2 infection and should not be used as the sole basis for treatment or other patient management decisions.  A negative result may occur with improper specimen collection / handling, submission of specimen other than nasopharyngeal swab, presence of viral mutation(s) within the areas targeted by this assay, and inadequate number of viral copies (<250 copies / mL). A  negative result must be combined with clinical observations, patient history, and epidemiological information.  Fact Sheet for Patients:   StrictlyIdeas.no  Fact Sheet for Healthcare Providers: BankingDealers.co.za  This test  is not yet approved or  cleared by the [place] and has been authorized for detection and/or diagnosis of SARS-CoV-2 by FDA under an Emergency Use Authorization (EUA).  This EUA will remain in effect (meaning this test can be used) for the duration of the COVID-19 declaration under Section 564(b)(1) of the Act, 21 U.S.C. section 360bbb-3(b)(1), unless the authorization is terminated or revoked sooner.  Performed at [facility], [place] [address] [place], [place] [address]   Culture, blood (routine x 2)     Status: None (Preliminary result)   Collection Time: 05/24/20  9:50 AM   Specimen: BLOOD RIGHT ARM  Result Value Ref Range Status   Specimen Description BLOOD RIGHT ARM  Final   Special Requests   Final    BOTTLES DRAWN AEROBIC AND ANAEROBIC Blood Culture adequate volume   Culture   Final    NO GROWTH 2 DAYS Performed at [facility], [address] [address] [place], [place] [address]    Report Status PENDING  Incomplete  Culture, blood (routine x 2)     Status: None (Preliminary result)   Collection Time: 05/24/20  9:54 AM   Specimen: BLOOD RIGHT HAND  Result Value Ref Range Status   Specimen Description BLOOD RIGHT HAND  Final   Special Requests   Final    BOTTLES DRAWN AEROBIC AND ANAEROBIC Blood Culture adequate volume   Culture   Final    NO GROWTH 2 DAYS Performed at [facility], [address], [place], [place] [address]    Report Status PENDING  Incomplete     Time coordinating discharge: Over 30 minutes  SIGNED:   Shawna Clamp, MD  [facility] 05/27/2020, 11:39 AM Pager   If 7PM-7AM, please contact night-coverage [email or web address]

## 2020-05-27 NOTE — Consult Note (Addendum)
Cardiology Consultation:   Patient ID: [name] MRN: [id number]; DOB: 12-02-1955  Admit date: 05/24/2020 Date of Consult: 05/27/2020  Primary Care Provider: [name], MD [facility] Cardiologist: New   Patient Profile:   David [name] is a 64 y.o. male with a hx of hypertension, PTSD, bipolar disorder, chronic alcohol abuse and polysubstance abuse who is being seen today for the evaluation of bradycardia at the request of Dr. Dwyane Dee.  History of Present Illness:   [name] brought to the emergency room 9/9 in the morning with acute onset shortness of breath and altered mental status.  He was in usual state of health up until going to bed night prior.  EMS found him hypertensive at 220/100.  CT of head without acute abnormality.  EEG with diffuse encephalopathy.  No seizure.  MRI of head without acute issue.  Seen by neurology.  His symptoms improved and eventually resolved with normalization of his blood pressure.  Wife reported noncompliance with his antihypertensive.  Urine drug screen was negative for cocaine but positive for amphetamine (was on Adderall) and marijuana.  He was placed Coreg 3.125 mg twice daily for blood pressure control however discontinued due to bradycardia with heart rate of 40s, currently his heart rate improved to 50s to 60s.  Amlodipine discontinued yesterday due to hypotension.  Currently only on lisinopril 10 mg with blood pressure of 93/59.  He has not been moving around.  Denies chest pain, shortness of breath, orthopnea, PND, syncope, lower extremity edema or melena.  No regular exercise.  He plays drums for living.  No prior cardiac history.  Echo 05/25/20  1. Left ventricular ejection fraction, by estimation, is 65 to 70%. The  left ventricle has normal function. The left ventricle has no regional  wall motion abnormalities. Left ventricular diastolic parameters are  consistent with Grade I diastolic  dysfunction (impaired relaxation).    2. Right ventricular systolic function is normal. The right ventricular  size is normal.  3. The mitral valve is normal in structure. Trivial mitral valve  regurgitation. No evidence of mitral stenosis.  4. The aortic valve is normal in structure. There is mild calcification  of the aortic valve. There is mild thickening of the aortic valve. Aortic  valve regurgitation is not visualized.   Past Medical History:  Diagnosis Date  . ADD (attention deficit disorder)   . Anemia    Microcytic  . Arthritis   . Bipolar disorder ([place])   . Depression   . Dyspnea    history of no current issues 05/26/2019  . GAD (generalized anxiety disorder)   . History of kidney stones    passed  . HTN (hypertension)   . PTSD (post-traumatic stress disorder)   . Skin cancer     Past Surgical History:  Procedure Laterality Date  . BACK SURGERY     Fusion - lumbar  . HIP PINNING,CANNULATED Right 06/24/2018   Procedure: RIGHT CANNULATED HIP PINNING;  Surgeon: Erle Crocker, MD;  Location: [facility];  Service: Orthopedics;  Laterality: Right;  . LITHOTRIPSY    . MULTIPLE EXTRACTIONS WITH ALVEOLOPLASTY Bilateral 05/03/2019   Procedure: MULTIPLE EXTRACTION TEETH NUMBER TWO, THREE, FIVE, SIX, SEVEN, EIGHT, NINE, TEN, ELEVEN, FIFTEEN, SIXTEEN, SEVENTEEN, NINETEEN, TWENTY, TWENTY-ONE, TWENTY-TWO, TWENTY-THREE, TWENTY-FOUR, TWENTY-FIVE, TWENTY-SIX, TWENTY-SEVEN, TWENTY-EIGHT, TWENTY-NINE, THIRTY-TWO WITH ALVEOLOPLASTY;  Surgeon: Diona Browner, DDS;  Location: [facility];  Service: Oral Surgery;  Laterality: Bilateral;  . NOSE SURGERY     x3  . TOTAL  HIP ARTHROPLASTY Left 05/27/2019   Procedure: TOTAL HIP ARTHROPLASTY ANTERIOR APPROACH;  Surgeon: Dorna Leitz, MD;  Location: [facility];  Service: Orthopedics;  Laterality: Left;     Inpatient Medications: Scheduled Meds: . aspirin EC  81 mg Oral Daily  . enoxaparin (LOVENOX) injection  40 mg Subcutaneous Q24H  . FLUoxetine  40 mg Oral Daily  . folic acid  1 mg  Oral Daily  . furosemide  20 mg Intravenous Once  . lisinopril  10 mg Oral Daily  . loratadine  10 mg Oral QHS  . multivitamin with minerals  1 tablet Oral Daily  . sodium chloride flush  3 mL Intravenous Q12H  . thiamine  100 mg Oral Daily   Or  . thiamine  100 mg Intravenous Daily  . zolpidem  10 mg Oral QHS   Continuous Infusions: . sodium chloride     PRN Meds: sodium chloride, acetaminophen, cetaphil, hydrALAZINE, hydrOXYzine, LORazepam **OR** LORazepam, OLANZapine, ondansetron (ZOFRAN) IV, ondansetron, sodium chloride flush  Allergies:    Allergies  Allergen Reactions  . Codeine Nausea Only    Social History:   Social History   Socioeconomic History  . Marital status: Married    Spouse name: Not on file  . Number of children: Not on file  . Years of education: Not on file  . Highest education level: Not on file  Occupational History  . Not on file  Tobacco Use  . Smoking status: Current Every Day Smoker    Packs/day: 1.00    Years: 40.00    Pack years: 40.00    Last attempt to quit: 04/25/2019    Years since quitting: 1.0  . Smokeless tobacco: Never Used  Vaping Use  . Vaping Use: Never used  Substance and Sexual Activity  . Alcohol use: Yes    Comment: daily-6-7/day  . Drug use: Not Currently    Types: Marijuana  . Sexual activity: Not on file  Other Topics Concern  . Not on file  Social History Narrative  . Not on file   Social Determinants of Health   Financial Resource Strain:   . Difficulty of Paying Living Expenses: Not on file  Food Insecurity:   . Worried About Charity fundraiser in the Last Year: Not on file  . Ran Out of Food in the Last Year: Not on file  Transportation Needs:   . Lack of Transportation (Medical): Not on file  . Lack of Transportation (Non-Medical): Not on file  Physical Activity:   . Days of Exercise per Week: Not on file  . Minutes of Exercise per Session: Not on file  Stress:   . Feeling of Stress : Not on file    Social Connections:   . Frequency of Communication with Friends and Family: Not on file  . Frequency of Social Gatherings with Friends and Family: Not on file  . Attends Religious Services: Not on file  . Active Member of Clubs or Organizations: Not on file  . Attends Archivist Meetings: Not on file  . Marital Status: Not on file  Intimate Partner Violence:   . Fear of Current or Ex-Partner: Not on file  . Emotionally Abused: Not on file  . Physically Abused: Not on file  . Sexually Abused: Not on file    Family History:  Family History  Problem Relation Age of Onset  . Arthritis Other   . Heart disease Other   . Cancer Other   . Hypertension  Other     ROS:  Please see the history of present illness.  All other ROS reviewed and negative.     Physical Exam/Data:   Vitals:   05/26/20 1944 05/27/20 0009 05/27/20 0427 05/27/20 0930  BP: 110/88 112/68 108/68 (!) 93/59  Pulse: 65 (!) 54 (!) 55   Resp: 18 18 20    Temp: 98 F (36.7 C) 98.7 F (37.1 C) 98.7 F (37.1 C)   TempSrc: Oral Oral Oral   SpO2: 99% 99% 99%   Weight:   75.7 kg   Height:        Intake/Output Summary (Last 24 hours) at 05/27/2020 0940 Last data filed at 05/27/2020 0932 Gross per 24 hour  Intake 123 ml  Output 600 ml  Net -477 ml   Last 3 Weights 05/27/2020 05/26/2020 05/25/2020  Weight (lbs) 166 lb 14.2 oz 174 lb 13.2 oz 169 lb 1.5 oz  Weight (kg) 75.7 kg 79.3 kg 76.7 kg     Body mass index is 25.38 kg/m.  General:  Well nourished, well developed, in no acute distress HEENT: normal Lymph: no adenopathy Neck: no JVD Endocrine:  No thryomegaly Vascular: No carotid bruits; FA pulses 2+ bilaterally without bruits  Cardiac:  normal S1, S2; RRR; no murmur  Lungs:  clear to auscultation bilaterally, no wheezing, rhonchi or rales  Abd: soft, nontender, no hepatomegaly  Ext: no edema Musculoskeletal:  No deformities, BUE and BLE strength normal and equal Skin: warm and dry  Neuro:  CNs  2-12 intact, no focal abnormalities noted Psych:  Normal affect   EKG:  The EKG was personally reviewed and demonstrates:  Sinus bradycardia at 49 bpm Telemetry:  Telemetry was personally reviewed and demonstrates:  SR at 50-60s  Relevant CV Studies: As above   Laboratory Data:  High Sensitivity Troponin:   Recent Labs  Lab 05/24/20 1001 05/24/20 1108  TROPONINIHS 9 10     Chemistry Recent Labs  Lab 05/25/20 0755 05/26/20 0438 05/27/20 0520  NA 134* 135 137  K 3.7 4.1 3.8  CL 103 102 106  CO2 22 23 22   GLUCOSE 123* 106* 95  BUN 12 16 14   CREATININE 0.86 1.07 1.06  CALCIUM 9.2 9.0 9.0  GFRNONAA >60 >60 >60  GFRAA >60 >60 >60  ANIONGAP 9 10 9     Recent Labs  Lab 05/24/20 1001 05/24/20 1346  PROT 6.6 7.2  ALBUMIN 3.5 3.7  AST 32 30  ALT 20 18  ALKPHOS 101 99  BILITOT 0.8 0.4   Hematology Recent Labs  Lab 05/24/20 1001 05/24/20 1001 05/24/20 1013 05/24/20 1346 05/26/20 0438  WBC 14.1*  --   --  14.3* 10.5  RBC 4.92  --   --  4.93 4.46  HGB 14.1   < > 14.6 14.2 12.7*  HCT 43.8   < > 43.0 42.6 38.8*  MCV 89.0  --   --  86.4 87.0  MCH 28.7  --   --  28.8 28.5  MCHC 32.2  --   --  33.3 32.7  RDW 15.3  --   --  15.1 15.7*  PLT 364  --   --  345 299   < > = values in this interval not displayed.   BNP Recent Labs  Lab 05/24/20 1001  BNP 608.4*    Radiology/Studies:  EEG  Result Date: 05/24/2020 Lora Havens, MD     05/24/2020  3:37 PM Patient Name: LYAL HUSTED MRN: [id number] Epilepsy Attending:  Lora Havens Referring Physician/Provider: Etta Quill, PA Date: 05/24/2020 Duration:  25.16 mins Patient history: 64 year old male presenting to the hospital after having acute shortness of breath and altered mental status. EEG to evaluate for seizure. Level of alertness: Awake, drowsy AEDs during EEG study: Ativan Technical aspects: This EEG study was done with scalp electrodes positioned according to the 10-20 International system of electrode  placement. Electrical activity was acquired at a sampling rate of 500Hz  and reviewed with a high frequency filter of 70Hz  and a low frequency filter of 1Hz . EEG data were recorded continuously and digitally stored. Description: No posterior dominant rhythm was seen. EEG showed continuous generalized polymorphic mixed frequencies 6-8Hz  theta-alpha activity as well as intermittent 13-15hz  beta activity in frontocentral region. Hyperventilation and photic stimulation were not performed.   ABNORMALITY -Continuous slow, generalized IMPRESSION: This study is suggestive of moderate diffuse encephalopathy, nonspecific etiology. No seizures or epileptiform discharges were seen throughout the recording. Lora Havens   CT Head Wo Contrast  Result Date: 05/24/2020 CLINICAL DATA:  Mental status change. EXAM: CT HEAD WITHOUT CONTRAST TECHNIQUE: Contiguous axial images were obtained from the base of the skull through the vertex without intravenous contrast. COMPARISON:  CT head 12/22/2019 FINDINGS: Brain: No evidence of acute large vascular territory infarction, acute hemorrhage, hydrocephalus, extra-axial collection or mass lesion/mass effect. Mild diffuse cerebral atrophy. Mild patchy white matter hypoattenuation, likely the sequela of chronic microvascular ischemic disease. Vascular: Calcific intracranial atherosclerosis. Skull: Normal. Negative for fracture or focal lesion. Sinuses/Orbits: Mild paranasal sinus mucosal thickening without air-fluid levels. No acute orbital abnormality. Other: No mastoid effusion. IMPRESSION: No acute intracranial abnormality. Electronically Signed   By: Margaretha Sheffield MD   On: 05/24/2020 10:41   MR BRAIN WO CONTRAST  Result Date: 05/24/2020 CLINICAL DATA:  Delirium EXAM: MRI HEAD WITHOUT CONTRAST TECHNIQUE: Multiplanar, multiecho pulse sequences of the brain and surrounding structures were obtained without intravenous contrast. COMPARISON:  None. FINDINGS: Study is limited by  patient motion and was terminated early due to patient intolerance/confusion. Within this limitation: Brain: No acute infarction, hemorrhage, hydrocephalus, extra-axial collection or mass lesion. Mild periventricular T2/FLAIR hyperintensities, likely the sequela of chronic microvascular ischemic disease. Vascular: Limited evaluation the flow voids due to patient motion. Skull and upper cervical spine: Normal marrow signal. Sinuses/Orbits: Mild scattered paranasal sinus mucosal thickening. No orbital mass. Other: No mastoid effusion. IMPRESSION: Study is limited by patient motion and was terminated early due to patient intolerance/confusion. Within this limitation, no evidence of acute intracranial abnormality. No acute infarct. Electronically Signed   By: Margaretha Sheffield MD   On: 05/24/2020 15:50   DG Chest Port 1 View  Result Date: 05/25/2020 CLINICAL DATA:  CHF. EXAM: PORTABLE CHEST 1 VIEW COMPARISON:  05/24/2020. FINDINGS: Mediastinum and hilar structures normal. Heart size normal. Improved bibasilar subsegmental atelectasis. No focal alveolar infiltrate. No pleural effusion or pneumothorax. No acute bony abnormality. IMPRESSION: Improved bibasilar subsegmental atelectasis. No focal alveolar infiltrate noted. Heart size normal. Electronically Signed   By: Marcello Moores  Register   On: 05/25/2020 06:40   DG Chest Port 1 View  Result Date: 05/24/2020 CLINICAL DATA:  Shortness of breath EXAM: PORTABLE CHEST 1 VIEW COMPARISON:  February 15, 2020 FINDINGS: There is atelectatic change in the lower lung regions. There is no edema or airspace opacity. Heart is upper normal in size with pulmonary vascularity normal. No adenopathy. No bone lesions. IMPRESSION: Bibasilar atelectasis. No edema or airspace opacity. Heart upper normal in size. Electronically Signed   By: Gwyndolyn Saxon  Jasmine December III M.D.   On: 05/24/2020 09:33   ECHOCARDIOGRAM COMPLETE  Result Date: 05/25/2020    ECHOCARDIOGRAM REPORT   Patient Name:   SAMUL MCINROY Date of Exam: 05/25/2020 Medical Rec #:  [id number]      Height:       68.0 in Accession #:    [id number]     Weight:       169.1 lb Date of Birth:  14-Jun-1956      BSA:          1.903 m Patient Age:    60 years       BP:           140/87 mmHg Patient Gender: M              HR:           58 bpm. Exam Location:  Inpatient Procedure: 2D Echo, Cardiac Doppler and Color Doppler Indications:    CHF  History:        Patient has no prior history of Echocardiogram examinations.                 Signs/Symptoms:Shortness of Breath and Altered Mental Status;                 Risk Factors:Current Smoker and Hypertension. Polysubstance                 abuse, alcohol abuse, PTSD.  Sonographer:    Dustin Flock Referring Phys: [id number] [place] Comments: Technically difficult study due to poor echo windows. Image acquisition challenging due to respiratory motion. IMPRESSIONS  1. Left ventricular ejection fraction, by estimation, is 65 to 70%. The left ventricle has normal function. The left ventricle has no regional wall motion abnormalities. Left ventricular diastolic parameters are consistent with Grade I diastolic dysfunction (impaired relaxation).  2. Right ventricular systolic function is normal. The right ventricular size is normal.  3. The mitral valve is normal in structure. Trivial mitral valve regurgitation. No evidence of mitral stenosis.  4. The aortic valve is normal in structure. There is mild calcification of the aortic valve. There is mild thickening of the aortic valve. Aortic valve regurgitation is not visualized. FINDINGS  Left Ventricle: Left ventricular ejection fraction, by estimation, is 65 to 70%. The left ventricle has normal function. The left ventricle has no regional wall motion abnormalities. The left ventricular internal cavity size was normal in size. There is  borderline left ventricular hypertrophy. Left ventricular diastolic parameters are consistent with Grade I diastolic  dysfunction (impaired relaxation). Right Ventricle: The right ventricular size is normal. No increase in right ventricular wall thickness. Right ventricular systolic function is normal. Left Atrium: Left atrial size was normal in size. Right Atrium: Right atrial size was normal in size. Pericardium: Trivial pericardial effusion is present. Mitral Valve: The mitral valve is normal in structure. Trivial mitral valve regurgitation. No evidence of mitral valve stenosis. Tricuspid Valve: The tricuspid valve is grossly normal. Tricuspid valve regurgitation is trivial. Aortic Valve: The aortic valve is normal in structure. There is mild calcification of the aortic valve. There is mild thickening of the aortic valve. There is mild aortic valve annular calcification. Aortic valve regurgitation is not visualized. Pulmonic Valve: The pulmonic valve was normal in structure. Pulmonic valve regurgitation is not visualized. Aorta: The aortic root and ascending aorta are structurally normal, with no evidence of dilitation. IAS/Shunts: The atrial septum is grossly normal.  LEFT VENTRICLE PLAX  2D LVIDd:         4.70 cm  Diastology LVIDs:         2.90 cm  LV e' medial:    7.51 cm/s LV PW:         1.20 cm  LV E/e' medial:  9.4 LV IVS:        1.10 cm  LV e' lateral:   6.53 cm/s LVOT diam:     2.10 cm  LV E/e' lateral: 10.8 LV SV:         117 LV SV Index:   62 LVOT Area:     3.46 cm  RIGHT VENTRICLE RV Basal diam:  3.10 cm RV S prime:     12.50 cm/s TAPSE (M-mode): 2.7 cm LEFT ATRIUM           Index       RIGHT ATRIUM           Index LA diam:      4.00 cm 2.10 cm/m  RA Area:     12.60 cm LA Vol (A2C): 38.9 ml 20.44 ml/m RA Volume:   29.30 ml  15.40 ml/m LA Vol (A4C): 71.8 ml 37.73 ml/m  AORTIC VALVE LVOT Vmax:   149.00 cm/s LVOT Vmean:  110.000 cm/s LVOT VTI:    0.339 m  AORTA Ao Root diam: 3.00 cm MITRAL VALVE MV Area (PHT): 2.54 cm    SHUNTS MV Decel Time: 299 msec    Systemic VTI:  0.34 m MV E velocity: 70.40 cm/s  Systemic  Diam: 2.10 cm MV A velocity: 88.10 cm/s MV E/A ratio:  0.80 Mertie Moores MD Electronically signed by Mertie Moores MD Signature Date/Time: 05/25/2020/11:17:38 AM    Final      Assessment and Plan:   1. Sinus bradycardia -Heart rate is improved to 50 to 60s from 40s after discontinuation of Coreg 3.125 mg (last dose a.m. of 05/25/2020).  At times goes to 70s during my evaluation. Asymptomatic.  He has not been moving around.  Advised to ambulate and expect improvement of heart rate further.  No further work-up.  2.  Hypertensive urgency - Due to noncompliance with his antihypertensive (clonidine).  Suspected rebound hypertension.  He was initially placed on Coreg, amlodipine and lisinopril after IV hydralazine. -Coreg discontinued due to bradycardia -Amlodipine discontinued due to hypotension -This morning his blood pressure was 93/59.  Advised nurse to hold lisinopril and ambulate patient to see blood pressure response.  3.  Acute pulmonary edema/acute diastolic dysfunction -Due to hypertensive urgency -Euvolemic by exam -Does not need daily diuretics -Recommended low-sodium diet and compliance with his antihypertensive -Echo with preserved LV function and grade 1 diastolic dysfunction  4.  Polysubstance abuse -Seems poor insight  Dr. Meda Coffee to see.  For questions or updates, please contact [place] Please consult [email or web address] for contact info under   Jarrett Soho, PA  05/27/2020 9:40 AM    The patient was seen, examined and discussed with Bhagat,Bhavinkumar PA-C and I agree with the above.   64 year old male with no prior cardiac history, h/o polysubstance abuse, admitted with mental status changes, hypertensive urgency post non-compliance with home meds (clonidine, lisinopril, metoprolol, hydralazine), who responded with bradycardia (down to 40', no pauses) and hypotension after getting meds here. Its possible to he wasn't taking his meds at home.  BB were  discontinued, currently his HR is in 60', SBP in90', he has been walking around and is asymptomatic. On physical exam he is  AAO x 3, no carotid bruits B/L, S1,2, no murmurs, lungs CTA B/L, no LE edema, good peripheral pulses. His echo shows LVEF 65-70%, grade I DD, no valvular abnormalities.  I would continue holding all of his meds, avoid amphetamines, he can be discharged today, he needs to have early follow up with his PCP an slowly re-introduce home meds - avoid AV nodal blocking agents in the future- no betablockers or calcium channel blockers.   Ena Dawley, MD 05/27/2020

## 2020-05-29 LAB — CULTURE, BLOOD (ROUTINE X 2)
Culture: NO GROWTH
Culture: NO GROWTH
Special Requests: ADEQUATE
Special Requests: ADEQUATE

## 2020-05-31 ENCOUNTER — Other Ambulatory Visit: Payer: Self-pay | Admitting: Psychiatry

## 2020-06-04 DIAGNOSIS — F3177 Bipolar disorder, in partial remission, most recent episode mixed: Secondary | ICD-10-CM | POA: Diagnosis not present

## 2020-06-04 DIAGNOSIS — I10 Essential (primary) hypertension: Secondary | ICD-10-CM | POA: Diagnosis not present

## 2020-06-04 DIAGNOSIS — L409 Psoriasis, unspecified: Secondary | ICD-10-CM | POA: Diagnosis not present

## 2020-06-04 DIAGNOSIS — Z09 Encounter for follow-up examination after completed treatment for conditions other than malignant neoplasm: Secondary | ICD-10-CM | POA: Diagnosis not present

## 2020-06-04 DIAGNOSIS — Z7189 Other specified counseling: Secondary | ICD-10-CM | POA: Diagnosis not present

## 2020-06-07 ENCOUNTER — Telehealth: Payer: Self-pay | Admitting: Psychiatry

## 2020-06-07 NOTE — Telephone Encounter (Signed)
Please RF Ambien and Adderall for pt. Appt is 10/11. Please send in to [facility] on [address]

## 2020-06-08 ENCOUNTER — Other Ambulatory Visit: Payer: Self-pay

## 2020-06-08 ENCOUNTER — Other Ambulatory Visit: Payer: Self-pay | Admitting: Psychiatry

## 2020-06-08 MED ORDER — ZOLPIDEM TARTRATE 10 MG PO TABS
ORAL_TABLET | ORAL | 2 refills | Status: DC
Start: 2020-06-08 — End: 2020-06-25

## 2020-06-08 NOTE — Telephone Encounter (Signed)
Reviewed, will update patient with information. He does have apt on 06/25/20

## 2020-06-08 NOTE — Telephone Encounter (Signed)
Last refill Ambien 05/11/2020 Last refill Adderall 05/10/2020  Pended Ambien only.  For some reason Adderall off patient's profile. Will have Dr. [name] review

## 2020-06-08 NOTE — Telephone Encounter (Signed)
Patient had hypertensive crisis with altered mental status on 05/24/2020 and was admitted to the hospital.  Was instructed to discontinue Adderall until blood pressure was better controlled.  Seen by PCP on 06/04/2020 with blood pressure 160/90 and started on olmesartan.  Clonidine was discontinued in the hospital because it was felt he had may have been noncompliant with the clonidine causing the hypertensive reaction.  Patient needs to come to the office to have his blood pressure checked and it needs to be approximately 140/90 or better in order to resume the normal dose of Adderall.  Will not send in Adderall RX today

## 2020-06-11 ENCOUNTER — Telehealth: Payer: Self-pay | Admitting: Psychiatry

## 2020-06-11 NOTE — Telephone Encounter (Signed)
Review his b/p

## 2020-06-11 NOTE — Telephone Encounter (Signed)
Pt returned call and was advised we need to check his BP in office before CC will decide if Adderall Rx will be sent to pharmacy. Pt reported he will come in this afternoon, due to apt 10/11 to have BP checked.

## 2020-06-11 NOTE — Telephone Encounter (Signed)
Pt came in office to have BP checked per CC. When pt arrived   BP 152/98   P 110 after waiting 19mins   BP 137/91  P103

## 2020-06-11 NOTE — Telephone Encounter (Signed)
Patient aware to come in for a blood pressure check.

## 2020-06-11 NOTE — Telephone Encounter (Signed)
Reviewed thank you 

## 2020-06-15 ENCOUNTER — Other Ambulatory Visit: Payer: Self-pay | Admitting: Psychiatry

## 2020-06-15 ENCOUNTER — Telehealth: Payer: Self-pay | Admitting: Psychiatry

## 2020-06-15 MED ORDER — AMPHETAMINE-DEXTROAMPHETAMINE 20 MG PO TABS: ORAL_TABLET | ORAL | 0 refills | Status: DC

## 2020-06-15 NOTE — Telephone Encounter (Signed)
David [name] came in today for a BP check which was requested by Dr. [name] in order to refill his Adderall. Did first BP reading 161/92 with pulse 104. Stopped and waited two minutes and did second BP-  136/87 with pulse 99. If appropriate I told him we would call him to let him know if we can fill his Adderall.

## 2020-06-15 NOTE — Telephone Encounter (Signed)
Done.  Repeat readings are better not normal though

## 2020-06-15 NOTE — Telephone Encounter (Signed)
Left him a detailed message and asked him to call back with any questions.

## 2020-06-15 NOTE — Telephone Encounter (Signed)
reviewed

## 2020-06-15 NOTE — Telephone Encounter (Signed)
Please review

## 2020-06-15 NOTE — Telephone Encounter (Signed)
I will refill his Adderall at a lower dose but he needs to keep these records of his BP and pulse and report them to his PCP.  Looks like his BP meds need to be increased a bit more.  Once his BP and pulse are better controlled we can increase Adderall as needed.

## 2020-06-24 DIAGNOSIS — L989 Disorder of the skin and subcutaneous tissue, unspecified: Secondary | ICD-10-CM | POA: Diagnosis not present

## 2020-06-25 ENCOUNTER — Other Ambulatory Visit: Payer: Self-pay

## 2020-06-25 ENCOUNTER — Encounter: Payer: Self-pay | Admitting: Psychiatry

## 2020-06-25 ENCOUNTER — Ambulatory Visit (INDEPENDENT_AMBULATORY_CARE_PROVIDER_SITE_OTHER): Payer: PPO | Admitting: Psychiatry

## 2020-06-25 VITALS — BP 132/84 | HR 108

## 2020-06-25 DIAGNOSIS — F411 Generalized anxiety disorder: Secondary | ICD-10-CM

## 2020-06-25 DIAGNOSIS — F902 Attention-deficit hyperactivity disorder, combined type: Secondary | ICD-10-CM | POA: Diagnosis not present

## 2020-06-25 DIAGNOSIS — F5105 Insomnia due to other mental disorder: Secondary | ICD-10-CM | POA: Diagnosis not present

## 2020-06-25 MED ORDER — HYDROXYZINE HCL 25 MG PO TABS
25.0000 mg | ORAL_TABLET | Freq: Four times a day (QID) | ORAL | 0 refills | Status: DC | PRN
Start: 2020-06-25 — End: 2020-08-08

## 2020-06-25 MED ORDER — QUETIAPINE FUMARATE 50 MG PO TABS
100.0000 mg | ORAL_TABLET | Freq: Every day | ORAL | 0 refills | Status: DC
Start: 2020-06-25 — End: 2020-08-06

## 2020-06-25 NOTE — Patient Instructions (Signed)
BP 132/84 pulse 108

## 2020-06-25 NOTE — Progress Notes (Signed)
MADYX [name] [id number] 1956/09/14 63 y.o.  Subjective:   Patient ID:  David [name] is a 64 y.o. (DOB [birth year]) male.  Chief Complaint:  Chief Complaint  Patient presents with  . Follow-up    mood and meds  . ADHD  . Hypertension  . Anxiety    Anxiety Symptoms include decreased concentration and nervous/anxious behavior. Patient reports no confusion or suicidal ideas.    Depression        Associated symptoms include decreased concentration.  Associated symptoms include no fatigue and no suicidal ideas.  Past medical history includes anxiety.   Hypertension Associated symptoms include anxiety.   [name] presents to the office today for follow-up of anxiety depression and ADD.  Last seen October 25, 2019.  No meds were changed  Had accident and fractured leg Jun 23, 2018 and had to have surgery with complications and incomplete recovery. .Pain is better and can walk without assistance now.  But can't eat bc all teeth pulled and had complications from that.  Still dealing with that.  Dentures not right. Has had a couple of infections that were in the skin and one cancer of skin.  Health has been a real drag on his mood over the last 18 mos.     Not as much things to enjoy as he'd like.  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    As of appointment January 03, 2020 he reports the following: Moved appt up.  Nephew in [place] and [name] is all he has.  Moving to [place]. Just finished prednisone.Was wired on it.  Wife noted it also.  Couldn't sleep and amped up.   Havent' seen mother in a while DT Covid.  M is amazingly still alive.  [name] has been supportive.   Has panic every 6-8 weeks and will have to leave the building.  Wife also has panic and drinking to deal with thte panic.   Haven't smoked in 3 mos and pleased with that but it's still hard.   No drugs.  Not hard.  Less drinking significantly.  He has cut back because wife's drinking is bothering him.  Says he  doing well with sobriety but still drinks regularly.  Denies getting drunk.  Disc risk of falling.  [name] died in motorcycle MVA Apr 25, 2019.  Very upset.  Was planning to go to [place] with him.  Plan no med changes  06/07/20 TC: Patient had hypertensive crisis with altered mental status on 05/24/2020 and was admitted to the hospital.  Was instructed to discontinue Adderall until blood pressure was better controlled. Seen by PCP on 06/04/2020 with blood pressure 160/90 and started on olmesartan.  Clonidine was discontinued in the hospital because it was felt he had may have been noncompliant with the clonidine causing the hypertensive reaction. Patient needs to come to the office to have his blood pressure checked and it needs to be approximately 140/90 or better in order to resume the normal dose of Adderall.  Will not send in Adderall RX today   06/15/20 TC: David [name] came in today for a BP check which was requested by Dr. [name] in order to refill his Adderall. Did first BP reading 161/92 with pulse 104. Stopped and waited two minutes and did second BP-  136/87 with pulse 99. If appropriate I told him we would call him to let him know if we can fill his Adderall. Sent in Adderall at lower dose  06/25/20 appointment with the following noted:  BP med changed to olmesartan and stopped clonidine. [name]. Some problems with depression and anxiety lately with stressors.  Itching drives him crazy from psoriasis. Don't sleep worth a damn. Ambien only works for a couple of hours. Again discussed recent hospitalization for hypertensive encephalopathy.  He strongly denies using any cocaine or abusing the Adderall.  He admits to some ongoing intermittent marijuana use but no other illicit drugs.  He does not recall any noncompliance with clonidine. He was not discharged on any hypertensive meds and has started olmesartan from his primary care doc but it does not appear to be fully controlling  his blood pressure.  He was encouraged to continue to talk to his primary care doctor about further med adjustments.  He has restarted a lower dose of Adderall 20 mg 3 times daily instead of 30 mg 3 times daily.  His pulse is borderline high as noted.  He does not have any symptoms related to either hypertension or tachycardia.    M 64 yo.  Past Psychiatric Medication Trials: Fluoxetine 40, duloxetine, clonidine, hydroxyzine, Lyrica, testosterone, Adderall, Ambien, vitamin D,  trazodone, Wellbutrin, venlafaxine Has been under the care of this practice since November 2000  Review of Systems:  Review of Systems  Constitutional: Negative for fatigue.  HENT: Negative for dental problem.   Musculoskeletal: Positive for arthralgias, back pain and gait problem.  Skin: Positive for rash.  Neurological: Positive for tremors.  Psychiatric/Behavioral: Positive for decreased concentration, depression, dysphoric mood and sleep disturbance. Negative for agitation, behavioral problems, confusion, hallucinations, self-injury and suicidal ideas. The patient is nervous/anxious. The patient is not hyperactive.     Medications: I have reviewed the patient's current medications.  Current Outpatient Medications  Medication Sig Dispense Refill  . amphetamine-dextroamphetamine (ADDERALL) 20 MG tablet 1 in AM , 1 at noon, 1/2 tablet at 4 PM 75 tablet 0  . cetaphil (CETAPHIL) lotion Apply 1 application topically as needed for dry skin.     . DUPIXENT 300 MG/2ML prefilled syringe Inject 300 mg into the skin every 14 (fourteen) days.    Marland Kitchen FLUoxetine (PROZAC) 20 MG capsule TAKE 2 CAPSULES(40 MG) BY MOUTH DAILY (Patient taking differently: Take 40 mg by mouth daily. ) 180 capsule 0  . hydrOXYzine (ATARAX/VISTARIL) 25 MG tablet Take 1 tablet (25 mg total) by mouth every 6 (six) hours as needed for itching or anxiety. 90 tablet 0  . levocetirizine (XYZAL) 5 MG tablet Take 5 mg by mouth at bedtime as needed for  allergies or itching.    . ondansetron (ZOFRAN) 4 MG tablet TAKE 1 TABLET(4 MG) BY MOUTH EVERY 8 HOURS AS NEEDED FOR NAUSEA OR VOMITING (Patient taking differently: Take 4 mg by mouth every 8 (eight) hours as needed for nausea or vomiting. ) 30 tablet 0  . pregabalin (LYRICA) 150 MG capsule Take 1 capsule by mouth twice daily (Patient taking differently: Take 150 mg by mouth 2 (two) times daily. ) 60 capsule 5  . triamcinolone ointment (KENALOG) 0.1 % Apply 1 application topically See admin instructions. Apply to affected area twice daily for 2 weeks on then 1 week off as needed for flare ups    . OLANZapine (ZYPREXA) 10 MG tablet TAKE 1/2 TO 1 TABLET BY MOUTH DAILY AS NEEDED FOR AGITATION OR ANXIETY (Patient not taking: Reported on 06/25/2020) 15 tablet 1  . QUEtiapine (SEROQUEL) 50 MG tablet Take 2 tablets (100 mg total) by mouth at bedtime. [place]  tablet 0   No current facility-administered medications for this visit.    Medication Side Effects: None  Allergies:  Allergies  Allergen Reactions  . Codeine Nausea Only    Past Medical History:  Diagnosis Date  . ADD (attention deficit disorder)   . Anemia    Microcytic  . Arthritis   . Bipolar disorder ([place])   . Depression   . Dyspnea    history of no current issues 05/26/2019  . GAD (generalized anxiety disorder)   . History of kidney stones    passed  . HTN (hypertension)   . PTSD (post-traumatic stress disorder)   . Skin cancer     Family History  Problem Relation Age of Onset  . Arthritis Other   . Heart disease Other   . Cancer Other   . Hypertension Other     Social History   Socioeconomic History  . Marital status: Married    Spouse name: Not on file  . Number of children: Not on file  . Years of education: Not on file  . Highest education level: Not on file  Occupational History  . Not on file  Tobacco Use  . Smoking status: Current Every Day Smoker    Packs/day: 1.00    Years: 40.00    Pack years: 40.00     Last attempt to quit: 04/25/2019    Years since quitting: 1.1  . Smokeless tobacco: Never Used  Vaping Use  . Vaping Use: Never used  Substance and Sexual Activity  . Alcohol use: Yes    Comment: daily-6-7/day  . Drug use: Not Currently    Types: Marijuana  . Sexual activity: Not on file  Other Topics Concern  . Not on file  Social History Narrative  . Not on file   Social Determinants of Health   Financial Resource Strain:   . Difficulty of Paying Living Expenses: Not on file  Food Insecurity:   . Worried About Charity fundraiser in the Last Year: Not on file  . Ran Out of Food in the Last Year: Not on file  Transportation Needs:   . Lack of Transportation (Medical): Not on file  . Lack of Transportation (Non-Medical): Not on file  Physical Activity:   . Days of Exercise per Week: Not on file  . Minutes of Exercise per Session: Not on file  Stress:   . Feeling of Stress : Not on file  Social Connections:   . Frequency of Communication with Friends and Family: Not on file  . Frequency of Social Gatherings with Friends and Family: Not on file  . Attends Religious Services: Not on file  . Active Member of Clubs or Organizations: Not on file  . Attends Archivist Meetings: Not on file  . Marital Status: Not on file  Intimate Partner Violence:   . Fear of Current or Ex-Partner: Not on file  . Emotionally Abused: Not on file  . Physically Abused: Not on file  . Sexually Abused: Not on file    Past Medical History, Surgical history, Social history, and Family history were reviewed and updated as appropriate.   Please see review of systems for further details on the patient's review from today.   Objective:   Physical Exam:  BP 132/84   Pulse (!) 108   Physical Exam Constitutional:      Appearance: Normal appearance.  Skin:    Findings: Rash present. Rash is crusting and urticarial.  Neurological:  Mental Status: He is alert.     Motor: No tremor.      Gait: Gait normal.  Psychiatric:        Attention and Perception: He is inattentive.        Mood and Affect: Mood is anxious and depressed. Affect is not labile or tearful.        Speech: Speech is rapid and pressured.        Behavior: Behavior is agitated and hyperactive. Behavior is not slowed.        Thought Content: Thought content is not paranoid. Thought content does not include homicidal or suicidal ideation.        Cognition and Memory: Cognition normal.     Comments: Fair insight and judgment. Less intense today but chronically fidgety.     Lab Review:     Component Value Date/Time   NA 137 05/27/2020 0520   K 3.8 05/27/2020 0520   CL 106 05/27/2020 0520   CO2 22 05/27/2020 0520   GLUCOSE 95 05/27/2020 0520   BUN 14 05/27/2020 0520   CREATININE 1.06 05/27/2020 0520   CREATININE TEST NOT PERFORMED 09/03/2012 1527   CALCIUM 9.0 05/27/2020 0520   PROT 7.2 05/24/2020 1346   ALBUMIN 3.7 05/24/2020 1346   AST 30 05/24/2020 1346   ALT 18 05/24/2020 1346   ALKPHOS 99 05/24/2020 1346   BILITOT 0.4 05/24/2020 1346   GFRNONAA >60 05/27/2020 0520   GFRNONAA TEST NOT PERFORMED 09/03/2012 1527   GFRAA >60 05/27/2020 0520   GFRAA TEST NOT PERFORMED 09/03/2012 1527       Component Value Date/Time   WBC 10.5 05/26/2020 0438   RBC 4.46 05/26/2020 0438   HGB 12.7 (L) 05/26/2020 0438   HCT 38.8 (L) 05/26/2020 0438   PLT 299 05/26/2020 0438   MCV 87.0 05/26/2020 0438   MCH 28.5 05/26/2020 0438   MCHC 32.7 05/26/2020 0438   RDW 15.7 (H) 05/26/2020 0438   LYMPHSABS 2.2 05/24/2020 1001   MONOABS 0.7 05/24/2020 1001   EOSABS 0.1 05/24/2020 1001   BASOSABS 0.1 05/24/2020 1001    No results found for: POCLITH, LITHIUM   No results found for: PHENYTOIN, PHENOBARB, VALPROATE, CBMZ   .res Assessment: Plan:    [place] was seen today for follow-up, adhd, hypertension and anxiety.  Diagnoses and all orders for this visit:  Generalized anxiety disorder  Attention  deficit hyperactivity disorder (ADHD), combined type  Insomnia due to mental condition  Other orders -     QUEtiapine (SEROQUEL) 50 MG tablet; Take 2 tablets (100 mg total) by mouth at bedtime. -     hydrOXYzine (ATARAX/VISTARIL) 25 MG tablet; Take 1 tablet (25 mg total) by mouth every 6 (six) hours as needed for itching or anxiety.     Greater than 50% of face to face time with patient was spent on counseling and coordination of care. We discussed that David [name] has been chronically disabled by severe ADD and some depression and anxiety.   Discussed at length his recent hypertensive encephalopathy which does not appear to be related to drugs or alcohol or noncompliance with clonidine by his report.  He has had no further episodes.  He was encouraged to continue to stay in contact with his doctor regularly about his hypertension. He is very bothered by what appears to be a psoriatic condition which is itching but he plans to see a dermatologist tomorrow.  He has not been recently agitated and therefore has discontinued olanzapine.  He  does need the hydroxyzine for itching and anxiety.  Discussed potential metabolic side effects associated with atypical antipsychotics, as well as potential risk for movement side effects. Advised pt to contact office if movement side effects occur.   Supportive therapy with focus on self care and substance abuse focus.  Maintain sobriety. Supportive therapy re: dealing with mother who hasn't been able to get the vaccine.  Stressed dealing with that.    Discussed potential benefits, risks, and side effects of stimulants with patient to include increased heart rate, palpitations, insomnia, increased anxiety, increased irritability, or decreased appetite.  Instructed patient to contact office if experiencing any significant tolerability issues. We will wait until his blood pressure is a little better controlled before increasing the Adderall.  Cont meds Prozac for  depression and anxiety and Adderall for ADD and clonidine for anxiety and hypertension and Lyrica for chronic pain and off label for anxiety.  Also Ambien for sleep  He is having some significant insomnia and Ambien has no longer been effective.  It appears he is tolerant.  Because his insomnia is so significant and unlikely to respond to other agents and he has failed other agents we will initiate a trial of quetiapine.  We discussed how that could also be used if tolerated a higher dose in order to treat depression and anxiety.  He is interested in that medication both for its effectiveness for sleep and the potential to get antidepressant effects out of it.  We will start him out at a low dose. Start quetiapine 50 to 100 mg nightly.  We discussed side effects in detail.  This appt was 30 mins.  Fu 3 to 4 weeks  Lynder Parents, MD, DFAPA   Please see After Visit Summary for patient specific instructions.  Future Appointments  Date Time Provider [place]  08/08/2020  2:30 PM Cottle, Billey Co., MD [facility] None    No orders of the defined types were placed in this encounter.     -------------------------------

## 2020-06-26 DIAGNOSIS — L308 Other specified dermatitis: Secondary | ICD-10-CM | POA: Diagnosis not present

## 2020-06-26 DIAGNOSIS — D485 Neoplasm of uncertain behavior of skin: Secondary | ICD-10-CM | POA: Diagnosis not present

## 2020-07-16 ENCOUNTER — Other Ambulatory Visit: Payer: Self-pay

## 2020-07-16 ENCOUNTER — Telehealth: Payer: Self-pay | Admitting: Psychiatry

## 2020-07-16 MED ORDER — AMPHETAMINE-DEXTROAMPHETAMINE 20 MG PO TABS
ORAL_TABLET | ORAL | 0 refills | Status: DC
Start: 2020-07-16 — End: 2020-08-08

## 2020-07-16 NOTE — Telephone Encounter (Signed)
Last refill 06/15/20  Adderall, pended for Dr. [name] to send.

## 2020-07-16 NOTE — Telephone Encounter (Signed)
Pt requests this month's RX for Adderall to be sent to [facility] on [address] Hilaria Ota 11/24

## 2020-07-19 IMAGING — RF DG HIP (WITH PELVIS) OPERATIVE*L*
1 series · 2 of 2 positions shown · non-contrast
Comparison: Radiograph June 23, 2018.

CLINICAL DATA: Left hip replacement.

EXAM:
OPERATIVE left HIP (WITH PELVIS IF PERFORMED) 2 VIEWS
TECHNIQUE: Fluoroscopic spot image(s) were submitted for interpretation
post-operatively.
FLUOROSCOPY TIME:  28 seconds.

[Series 1: run · 2 of 2 slices shown]
[im 1/2]
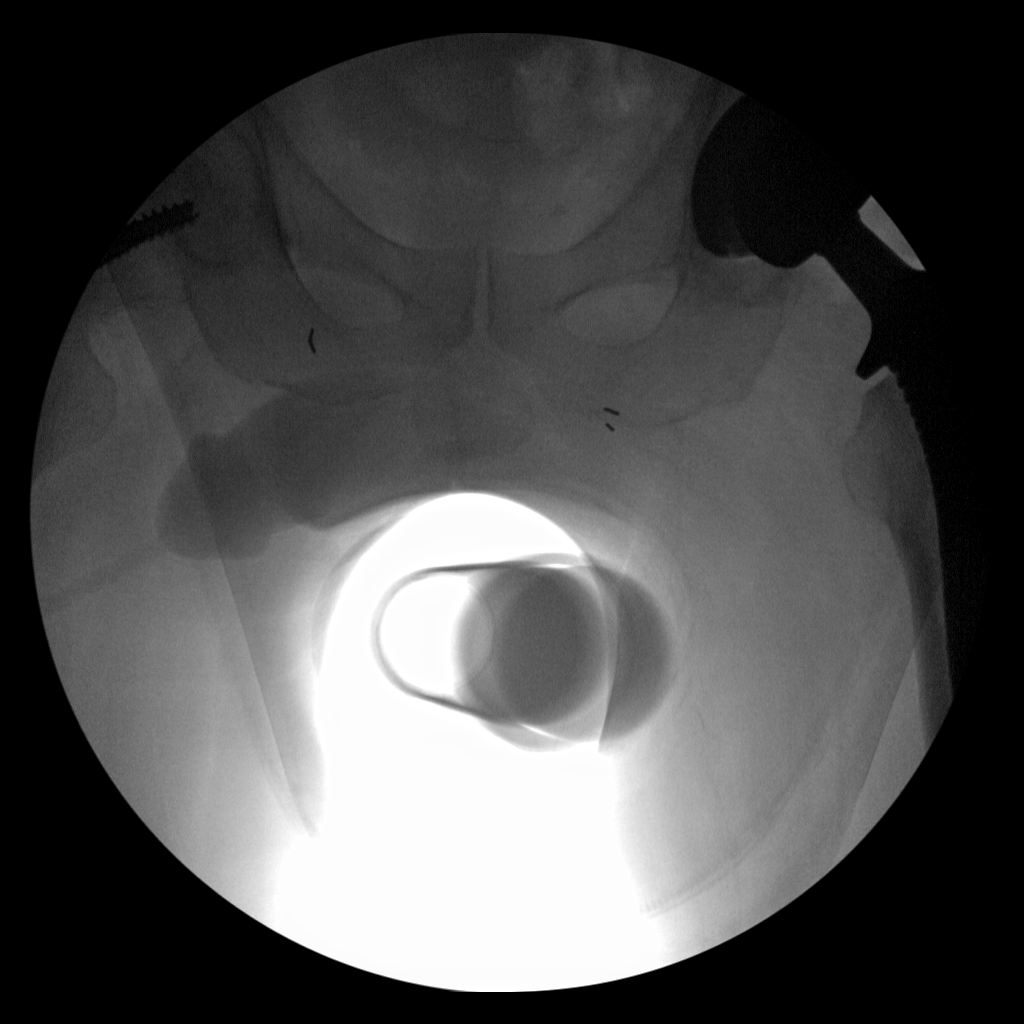
[im 2/2]
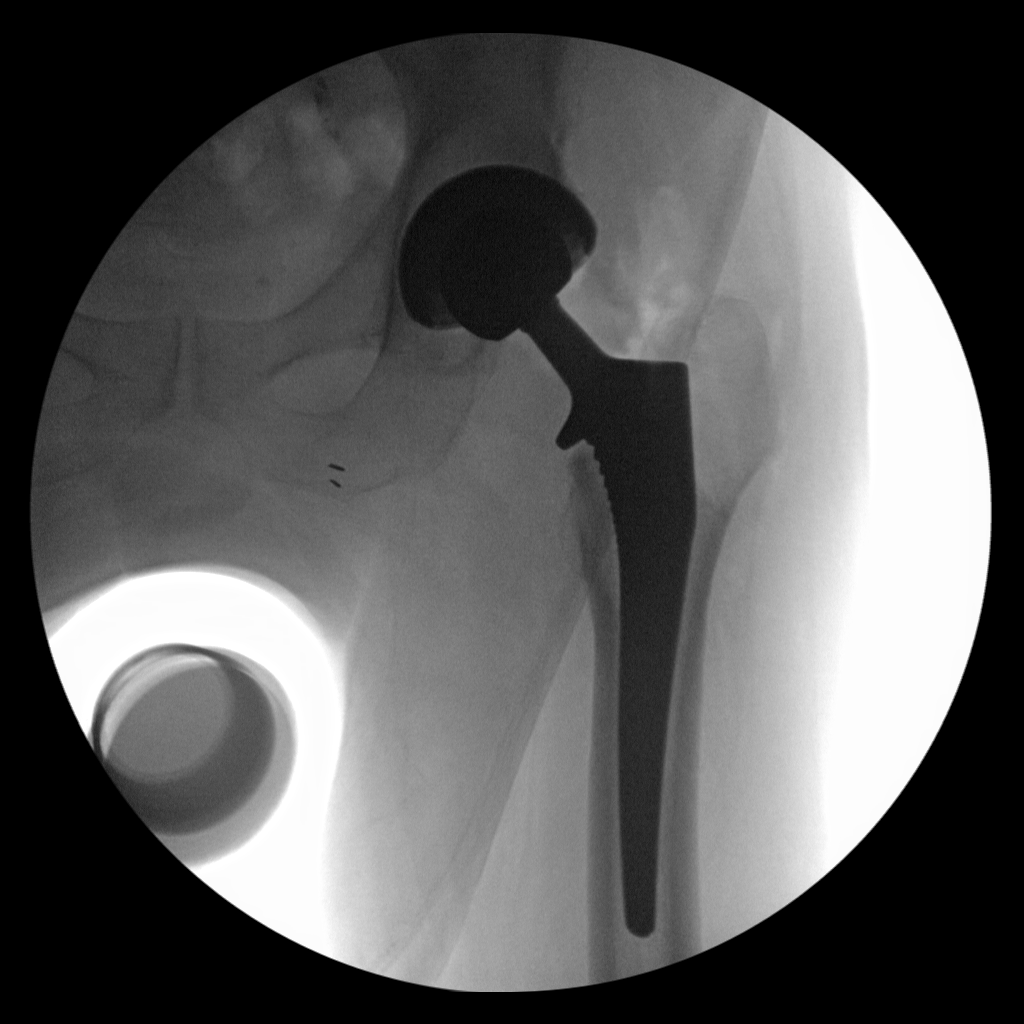

[2 of 2 positions shown; findings below may reference images not displayed]

FINDINGS: Two intraoperative fluoroscopic images of the left hip demonstrate
the left acetabular and femoral components to be well situated.
Expected postoperative changes are seen in the surrounding soft
tissues.
IMPRESSION: Status post left total hip arthroplasty.

## 2020-07-19 IMAGING — RF DG C-ARM 1-60 MIN-NO REPORT
1 series · 2 of 2 positions shown · non-contrast
Comparison: Radiograph June 23, 2018.

CLINICAL DATA: Left hip replacement.

EXAM:
OPERATIVE left HIP (WITH PELVIS IF PERFORMED) 2 VIEWS
TECHNIQUE: Fluoroscopic spot image(s) were submitted for interpretation
post-operatively.
FLUOROSCOPY TIME:  28 seconds.

[Series 1: run · 2 of 2 slices shown]
[im 1/2]
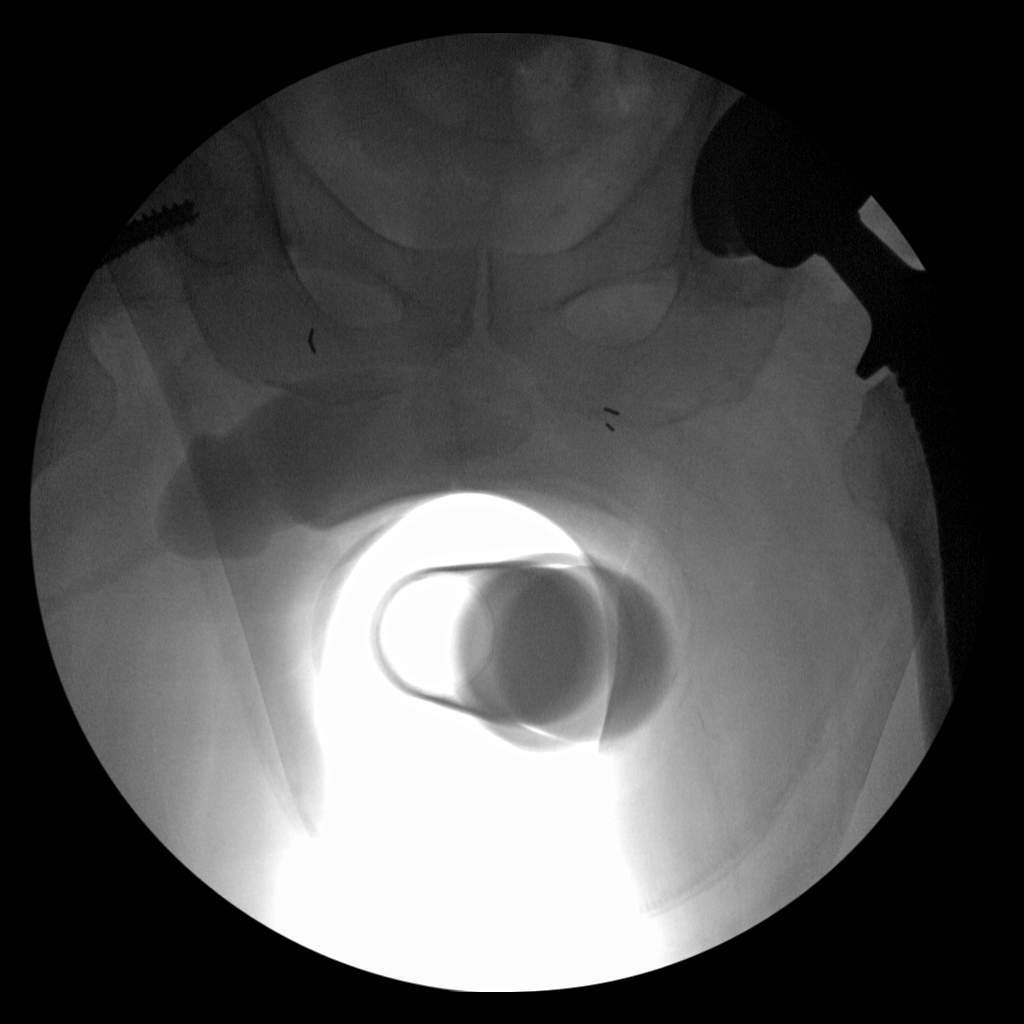
[im 2/2]
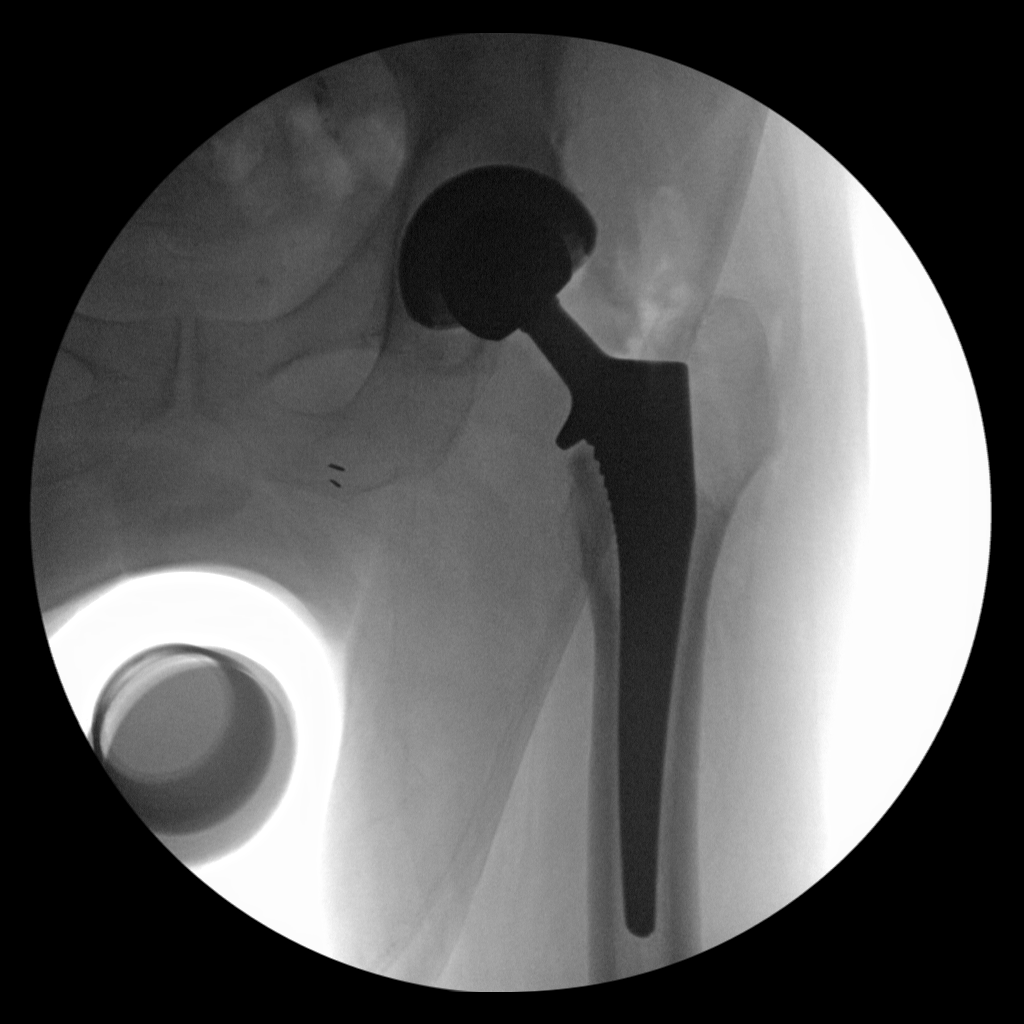

[2 of 2 positions shown; findings below may reference images not displayed]

FINDINGS: Two intraoperative fluoroscopic images of the left hip demonstrate
the left acetabular and femoral components to be well situated.
Expected postoperative changes are seen in the surrounding soft
tissues.
IMPRESSION: Status post left total hip arthroplasty.

## 2020-07-23 DIAGNOSIS — L239 Allergic contact dermatitis, unspecified cause: Secondary | ICD-10-CM | POA: Diagnosis not present

## 2020-07-27 DIAGNOSIS — L400 Psoriasis vulgaris: Secondary | ICD-10-CM | POA: Diagnosis not present

## 2020-07-27 DIAGNOSIS — L409 Psoriasis, unspecified: Secondary | ICD-10-CM | POA: Diagnosis not present

## 2020-07-27 DIAGNOSIS — Z79899 Other long term (current) drug therapy: Secondary | ICD-10-CM | POA: Diagnosis not present

## 2020-07-27 DIAGNOSIS — L239 Allergic contact dermatitis, unspecified cause: Secondary | ICD-10-CM | POA: Diagnosis not present

## 2020-07-29 ENCOUNTER — Other Ambulatory Visit: Payer: Self-pay | Admitting: Psychiatry

## 2020-08-05 ENCOUNTER — Other Ambulatory Visit: Payer: Self-pay | Admitting: Psychiatry

## 2020-08-08 ENCOUNTER — Encounter: Payer: Self-pay | Admitting: Psychiatry

## 2020-08-08 ENCOUNTER — Other Ambulatory Visit: Payer: Self-pay

## 2020-08-08 ENCOUNTER — Ambulatory Visit (INDEPENDENT_AMBULATORY_CARE_PROVIDER_SITE_OTHER): Payer: PPO | Admitting: Psychiatry

## 2020-08-08 DIAGNOSIS — F902 Attention-deficit hyperactivity disorder, combined type: Secondary | ICD-10-CM

## 2020-08-08 DIAGNOSIS — F308 Other manic episodes: Secondary | ICD-10-CM

## 2020-08-08 DIAGNOSIS — F5105 Insomnia due to other mental disorder: Secondary | ICD-10-CM | POA: Diagnosis not present

## 2020-08-08 DIAGNOSIS — I10 Essential (primary) hypertension: Secondary | ICD-10-CM | POA: Diagnosis not present

## 2020-08-08 DIAGNOSIS — F17213 Nicotine dependence, cigarettes, with withdrawal: Secondary | ICD-10-CM

## 2020-08-08 DIAGNOSIS — F411 Generalized anxiety disorder: Secondary | ICD-10-CM | POA: Diagnosis not present

## 2020-08-08 MED ORDER — AMPHETAMINE-DEXTROAMPHETAMINE 20 MG PO TABS: ORAL_TABLET | ORAL | 0 refills | Status: DC

## 2020-08-08 MED ORDER — QUETIAPINE FUMARATE 100 MG PO TABS: 200.0000 mg | ORAL_TABLET | Freq: Every day | ORAL | 1 refills | Status: DC

## 2020-08-08 MED ORDER — FLUOXETINE HCL 20 MG PO CAPS: 40.0000 mg | ORAL_CAPSULE | Freq: Every day | ORAL | 0 refills | Status: DC

## 2020-08-08 MED ORDER — HYDROXYZINE HCL 25 MG PO TABS: 25.0000 mg | ORAL_TABLET | Freq: Four times a day (QID) | ORAL | 1 refills | Status: DC | PRN
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
## 2020-08-28 ENCOUNTER — Other Ambulatory Visit: Payer: Self-pay | Admitting: Psychiatry

## 2020-09-11 ENCOUNTER — Other Ambulatory Visit: Payer: Self-pay | Admitting: Psychiatry

## 2020-09-11 ENCOUNTER — Telehealth: Payer: Self-pay | Admitting: Psychiatry

## 2020-09-11 DIAGNOSIS — F902 Attention-deficit hyperactivity disorder, combined type: Secondary | ICD-10-CM

## 2020-09-11 MED ORDER — AMPHETAMINE-DEXTROAMPHETAMINE 20 MG PO TABS: ORAL_TABLET | ORAL | 0 refills | Status: DC

## 2020-09-11 NOTE — Telephone Encounter (Signed)
Pt would like a refill on Adderall. Please send to [facility] [place] [facility] on [address] in [place].

## 2020-10-04 ENCOUNTER — Other Ambulatory Visit: Payer: Self-pay

## 2020-10-04 ENCOUNTER — Encounter: Payer: Self-pay | Admitting: Psychiatry

## 2020-10-04 ENCOUNTER — Ambulatory Visit (INDEPENDENT_AMBULATORY_CARE_PROVIDER_SITE_OTHER): Payer: Medicare Other | Admitting: Psychiatry

## 2020-10-04 DIAGNOSIS — F902 Attention-deficit hyperactivity disorder, combined type: Secondary | ICD-10-CM

## 2020-10-04 DIAGNOSIS — F5105 Insomnia due to other mental disorder: Secondary | ICD-10-CM | POA: Diagnosis not present

## 2020-10-04 DIAGNOSIS — F411 Generalized anxiety disorder: Secondary | ICD-10-CM

## 2020-10-04 DIAGNOSIS — F308 Other manic episodes: Secondary | ICD-10-CM

## 2020-10-04 MED ORDER — AMPHETAMINE-DEXTROAMPHETAMINE 20 MG PO TABS: ORAL_TABLET | ORAL | 0 refills | Status: DC

## 2020-10-04 MED ORDER — FLUOXETINE HCL 20 MG PO CAPS: 40.0000 mg | ORAL_CAPSULE | Freq: Every day | ORAL | 0 refills | Status: DC

## 2020-10-04 MED ORDER — QUETIAPINE FUMARATE 100 MG PO TABS: 200.0000 mg | ORAL_TABLET | Freq: Every day | ORAL | 1 refills | Status: DC

## 2020-10-04 MED ORDER — PREGABALIN 150 MG PO CAPS
150.0000 mg | ORAL_CAPSULE | Freq: Two times a day (BID) | ORAL | 5 refills | Status: DC
Start: 2020-10-04 — End: 2020-12-31

## 2020-10-04 MED ORDER — HYDROXYZINE HCL 25 MG PO TABS: 25.0000 mg | ORAL_TABLET | Freq: Four times a day (QID) | ORAL | 1 refills | Status: DC | PRN
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
## 2020-10-15 ENCOUNTER — Other Ambulatory Visit: Payer: Self-pay | Admitting: Psychiatry

## 2020-11-01 ENCOUNTER — Telehealth: Payer: Self-pay | Admitting: Psychiatry

## 2020-11-01 ENCOUNTER — Other Ambulatory Visit: Payer: Self-pay | Admitting: Psychiatry

## 2020-11-01 DIAGNOSIS — F902 Attention-deficit hyperactivity disorder, combined type: Secondary | ICD-10-CM

## 2020-11-01 MED ORDER — ZOLPIDEM TARTRATE 10 MG PO TABS
ORAL_TABLET | ORAL | 1 refills | Status: DC
Start: 2020-11-01 — End: 2020-12-31

## 2020-11-01 MED ORDER — AMPHETAMINE-DEXTROAMPHETAMINE 20 MG PO TABS: ORAL_TABLET | ORAL | 0 refills | Status: DC

## 2020-11-01 NOTE — Telephone Encounter (Signed)
Pt requesting refill for Adderall and Ambien @ [facility]. Apt 3/15

## 2020-11-27 ENCOUNTER — Ambulatory Visit: Payer: Medicare Other | Admitting: Psychiatry

## 2020-11-30 ENCOUNTER — Telehealth: Payer: Self-pay | Admitting: Psychiatry

## 2020-11-30 ENCOUNTER — Other Ambulatory Visit: Payer: Self-pay | Admitting: Psychiatry

## 2020-11-30 DIAGNOSIS — F902 Attention-deficit hyperactivity disorder, combined type: Secondary | ICD-10-CM

## 2020-11-30 DIAGNOSIS — F411 Generalized anxiety disorder: Secondary | ICD-10-CM

## 2020-11-30 MED ORDER — AMPHETAMINE-DEXTROAMPHETAMINE 20 MG PO TABS: ORAL_TABLET | ORAL | 0 refills | Status: DC

## 2020-11-30 NOTE — Telephone Encounter (Signed)
I will pend for Dr. [name]

## 2020-11-30 NOTE — Telephone Encounter (Signed)
Please review

## 2020-11-30 NOTE — Telephone Encounter (Signed)
Pharmacy called and wants a refill of ray's adderall 20 mg to be sent to them. The [facility] on [address].

## 2020-12-18 ENCOUNTER — Other Ambulatory Visit: Payer: Self-pay | Admitting: Psychiatry

## 2020-12-18 DIAGNOSIS — F308 Other manic episodes: Secondary | ICD-10-CM

## 2020-12-18 DIAGNOSIS — F5105 Insomnia due to other mental disorder: Secondary | ICD-10-CM

## 2020-12-18 DIAGNOSIS — F411 Generalized anxiety disorder: Secondary | ICD-10-CM

## 2020-12-31 ENCOUNTER — Telehealth: Payer: Self-pay | Admitting: Psychiatry

## 2020-12-31 ENCOUNTER — Other Ambulatory Visit: Payer: Self-pay | Admitting: Psychiatry

## 2020-12-31 MED ORDER — ZOLPIDEM TARTRATE 10 MG PO TABS: ORAL_TABLET | ORAL | 3 refills | Status: DC

## 2020-12-31 MED ORDER — PREGABALIN 150 MG PO CAPS: 150.0000 mg | ORAL_CAPSULE | Freq: Two times a day (BID) | ORAL | 5 refills | Status: DC

## 2020-12-31 NOTE — Telephone Encounter (Signed)
Next visit it 01/21/21. Requesting refills on Adderall and generic Lyrica called to:  [facility] [place], [place] AT [address] Phone:  [phone]  Fax:  [phone]

## 2020-12-31 NOTE — Telephone Encounter (Signed)
Please review

## 2021-01-01 ENCOUNTER — Other Ambulatory Visit: Payer: Self-pay | Admitting: Psychiatry

## 2021-01-01 ENCOUNTER — Telehealth: Payer: Self-pay | Admitting: Psychiatry

## 2021-01-01 DIAGNOSIS — F902 Attention-deficit hyperactivity disorder, combined type: Secondary | ICD-10-CM

## 2021-01-01 MED ORDER — AMPHETAMINE-DEXTROAMPHETAMINE 20 MG PO TABS
ORAL_TABLET | ORAL | 0 refills | Status: DC
Start: 2021-01-01 — End: 2021-01-21

## 2021-01-01 NOTE — Telephone Encounter (Signed)
Last filled 3/18 with no refills remaining

## 2021-01-01 NOTE — Telephone Encounter (Signed)
David [name] called to check status of his request for Adderall.  He got the Lyrica and ambien, but not the Adderall.  Please send in refill for the Adderall.  [facility] on [address].  Appt. 01/21/21

## 2021-01-17 ENCOUNTER — Other Ambulatory Visit: Payer: Self-pay | Admitting: Psychiatry

## 2021-01-21 ENCOUNTER — Other Ambulatory Visit: Payer: Self-pay

## 2021-01-21 ENCOUNTER — Encounter: Payer: Self-pay | Admitting: Psychiatry

## 2021-01-21 ENCOUNTER — Ambulatory Visit (INDEPENDENT_AMBULATORY_CARE_PROVIDER_SITE_OTHER): Payer: Medicare Other | Admitting: Psychiatry

## 2021-01-21 VITALS — BP 125/67 | HR 97

## 2021-01-21 DIAGNOSIS — F902 Attention-deficit hyperactivity disorder, combined type: Secondary | ICD-10-CM

## 2021-01-21 DIAGNOSIS — F411 Generalized anxiety disorder: Secondary | ICD-10-CM | POA: Diagnosis not present

## 2021-01-21 DIAGNOSIS — I10 Essential (primary) hypertension: Secondary | ICD-10-CM

## 2021-01-21 DIAGNOSIS — F5105 Insomnia due to other mental disorder: Secondary | ICD-10-CM

## 2021-01-21 DIAGNOSIS — F17213 Nicotine dependence, cigarettes, with withdrawal: Secondary | ICD-10-CM

## 2021-01-21 DIAGNOSIS — F1011 Alcohol abuse, in remission: Secondary | ICD-10-CM

## 2021-01-21 DIAGNOSIS — F308 Other manic episodes: Secondary | ICD-10-CM

## 2021-01-21 MED ORDER — QUETIAPINE FUMARATE 100 MG PO TABS
ORAL_TABLET | ORAL | 0 refills | Status: DC
Start: 2021-01-21 — End: 2021-03-21

## 2021-01-21 MED ORDER — AMPHETAMINE-DEXTROAMPHETAMINE 20 MG PO TABS: ORAL_TABLET | ORAL | 0 refills | Status: DC

## 2021-01-21 NOTE — Progress Notes (Signed)
David [name] [id number] 07/29/1956 65 y.o.  Subjective:   Patient ID:  David [name] is a 65 y.o. (DOB [birth year]) male.  Chief Complaint:  Chief Complaint  Patient presents with  . Follow-up  . Attention deficit hyperactivity disorder (ADHD), combined t  . Generalized anxiety disorder    Hypertension Associated symptoms include anxiety. Pertinent negatives include no chest pain or palpitations.  Anxiety Symptoms include decreased concentration and nervous/anxious behavior. Patient reports no chest pain, confusion, palpitations or suicidal ideas.    Depression        Associated symptoms include decreased concentration.  Associated symptoms include no fatigue and no suicidal ideas.  Past medical history includes anxiety.    David [name] presents to the office today for follow-up of anxiety depression and ADD.  seen October 25, 2019.  No meds were changed  Had accident and fractured leg Jun 23, 2018 and had to have surgery with complications and incomplete recovery. .Pain is better and can walk without assistance now.  But can't eat bc all teeth pulled and had complications from that.  Still dealing with that.  Dentures not right. Has had a couple of infections that were in the skin and one cancer of skin.  Health has been a real drag on his mood over the last 18 mos.     Not as much things to enjoy as he'd like.  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    As of appointment January 03, 2020 he reports the following: Moved appt up.  Nephew in [place] and [name] is all he has.  Moving to [place]. Just finished prednisone.Was wired on it.  Wife noted it also.  Couldn't sleep and amped up.   Havent' seen mother in a while DT Covid.  David [name] is amazingly still alive.  [name] has been supportive.   Has panic every 6-8 weeks and will have to leave the building.  Wife also has panic and drinking to deal with thte panic.   Haven't smoked in 3 mos and pleased with that but it's still hard.   No  drugs.  Not hard.  Less drinking significantly.  He has cut back because wife's drinking is bothering him.  Says he doing well with sobriety but still drinks regularly.  Denies getting drunk.  Disc risk of falling.  [name] died in motorcycle MVA 11-May-2019.  Very upset.  Was planning to go to [place] with him.  Plan no med changes  06/07/20 TC: Patient had hypertensive crisis with altered mental status on 05/24/2020 and was admitted to the hospital.  Was instructed to discontinue Adderall until blood pressure was better controlled. Seen by PCP on 06/04/2020 with blood pressure 160/90 and started on olmesartan.  Clonidine was discontinued in the hospital because it was felt he had may have been noncompliant with the clonidine causing the hypertensive reaction. Patient needs to come to the office to have his blood pressure checked and it needs to be approximately 140/90 or better in order to resume the normal dose of Adderall.  Will not send in Adderall RX today   06/15/20 TC: Ray came in today for a BP check which was requested by Dr. [name] in order to refill his Adderall. Did first BP reading 161/92 with pulse 104. Stopped and waited two minutes and did second BP-  136/87 with pulse 99. If appropriate I told him we would call him to let him know if we can fill his  Adderall. Sent in Adderall at lower dose   06/25/20 appointment with the following noted:  BP med changed to olmesartan and stopped clonidine. [name]. Some problems with depression and anxiety lately with stressors.  Itching drives him crazy from psoriasis. Don't sleep worth a damn. Ambien only works for a couple of hours. Again discussed recent hospitalization for hypertensive encephalopathy.  He strongly denies using any cocaine or abusing the Adderall.  He admits to some ongoing intermittent marijuana use but no other illicit drugs.  He does not recall any noncompliance with clonidine. He was not discharged on any  hypertensive meds and has started olmesartan from his primary care doc but it does not appear to be fully controlling his blood pressure.  He was encouraged to continue to talk to his primary care doctor about further med adjustments. He has restarted a lower dose of Adderall 20 mg 3 times daily instead of 30 mg 3 times daily.  His pulse is borderline high as noted.  He does not have any symptoms related to either hypertension or tachycardia.  Plan: Start quetiapine 50 to 100 mg nightly.  08/08/2020 appointment with following noted: Psoriasis is driving me crazy.  Will show up at derm over it trying to get help.  Dupixent didn't help psoriasis after 4 mos. Quetiapine is helping some but psoriasis is interfering with his whole life. Thinks he ran out of quetiapine.  10/04/2020 appt noted: December 13 pulled over.  Lost license and debit card.  Was told license revoked for failure to appear at court in April.  Had seen an attorney over it and the charges were dismissed and he had been told this.  But clerical problems kept him from getting license. Off and on urinary urgency.  Ongoing chronic stress.  Affect sleep and anxiety levels mainly.  Not profoundly depressed.  Denies significant manic symptoms.  Denies alcohol abuse or heart drug use.  Some marijuana use Plan: Increase to quetiapine 200 mg nightly.   01/21/2021 appointment with the following noted: Still dealing with teeth issues and hasn't been able to get false teeth back.  Top medical concern. 08/27/20 legal issue is still unresolved also.  Blew 0.02 twice and then had blood drawn.  Denies being drunk.  Feels like he was charged bc speech affected by not having teeth.   Cost of legal aspect is frustrating. Doing ok with meds.  Tolerating and benefits.  David [name] 65 yo.  Past Psychiatric Medication Trials: Fluoxetine 40, duloxetine,  Wellbutrin, venlafaxine Olanzapine 10, quetiapine, clonidine, hydroxyzine, Lyrica,trazodone, Ambien,   testosterone,  Adderall,  vitamin D,  , Has been under the care of this practice since November 2000  Review of Systems:  Review of Systems  Constitutional: Negative for fatigue.  HENT: Negative for dental problem.   Cardiovascular: Negative for chest pain and palpitations.  Musculoskeletal: Positive for arthralgias and back pain. Negative for gait problem.  Skin: Positive for rash.       Severe itching  Neurological: Negative for tremors.  Psychiatric/Behavioral: Positive for decreased concentration, depression, dysphoric mood and sleep disturbance. Negative for agitation, behavioral problems, confusion, hallucinations, self-injury and suicidal ideas. The patient is nervous/anxious. The patient is not hyperactive.     Medications: I have reviewed the patient's current medications.  Current Outpatient Medications  Medication Sig Dispense Refill  . cetaphil (CETAPHIL) lotion Apply 1 application topically as needed for dry skin.     Marland Kitchen FLUoxetine (PROZAC) 20 MG capsule TAKE 2 CAPSULES(40 MG) BY MOUTH  DAILY 180 capsule 0  . hydrOXYzine (ATARAX/VISTARIL) 25 MG tablet TAKE 1 TABLET(25 MG) BY MOUTH EVERY 6 HOURS AS NEEDED FOR ITCHING OR ANXIETY 120 tablet 1  . pregabalin (LYRICA) 150 MG capsule Take 1 capsule (150 mg total) by mouth 2 (two) times daily. 60 capsule 5  . triamcinolone ointment (KENALOG) 0.1 % Apply 1 application topically See admin instructions. Apply to affected area twice daily for 2 weeks on then 1 week off as needed for flare ups    . zolpidem (AMBIEN) 10 MG tablet 1 to 1-1/2 tablets at night as needed for sleep. 45 tablet 3  . amphetamine-dextroamphetamine (ADDERALL) 20 MG tablet 1 in AM , 1 at noon, 1/2 tablet at 4 PM 75 tablet 0  . DUPIXENT 300 MG/2ML prefilled syringe Inject 300 mg into the skin every 14 (fourteen) days. (Patient not taking: Reported on 01/21/2021)    . levocetirizine (XYZAL) 5 MG tablet Take 5 mg by mouth at bedtime as needed for allergies or itching.  (Patient not taking: Reported on 01/21/2021)    . ondansetron (ZOFRAN) 4 MG tablet TAKE 1 TABLET(4 MG) BY MOUTH EVERY 8 HOURS AS NEEDED FOR NAUSEA OR VOMITING (Patient not taking: Reported on 01/21/2021) 30 tablet 0  . QUEtiapine (SEROQUEL) 100 MG tablet TAKE 2 TABLETS(200 MG) BY MOUTH AT BEDTIME 180 tablet 0   No current facility-administered medications for this visit.    Medication Side Effects: None  Allergies:  Allergies  Allergen Reactions  . Codeine Nausea Only    Past Medical History:  Diagnosis Date  . ADD (attention deficit disorder)   . Anemia    Microcytic  . Arthritis   . Bipolar disorder ([place])   . Depression   . Dyspnea    history of no current issues 05/26/2019  . GAD (generalized anxiety disorder)   . History of kidney stones    passed  . HTN (hypertension)   . PTSD (post-traumatic stress disorder)   . Skin cancer     Family History  Problem Relation Age of Onset  . Arthritis Other   . Heart disease Other   . Cancer Other   . Hypertension Other     Social History   Socioeconomic History  . Marital status: Married    Spouse name: Not on file  . Number of children: Not on file  . Years of education: Not on file  . Highest education level: Not on file  Occupational History  . Not on file  Tobacco Use  . Smoking status: Current Every Day Smoker    Packs/day: 1.00    Years: 40.00    Pack years: 40.00    Last attempt to quit: 04/25/2019    Years since quitting: 1.7  . Smokeless tobacco: Never Used  Vaping Use  . Vaping Use: Never used  Substance and Sexual Activity  . Alcohol use: Yes    Comment: daily-6-7/day  . Drug use: Not Currently    Types: Marijuana  . Sexual activity: Not on file  Other Topics Concern  . Not on file  Social History Narrative  . Not on file   Social Determinants of Health   Financial Resource Strain: Not on file  Food Insecurity: Not on file  Transportation Needs: Not on file  Physical Activity: Not on file   Stress: Not on file  Social Connections: Not on file  Intimate Partner Violence: Not on file    Past Medical History, Surgical history, Social history, and Family history  were reviewed and updated as appropriate.   Please see review of systems for further details on the patient's review from today.   Objective:   Physical Exam:  BP 125/67   Pulse 97   Physical Exam Constitutional:      Appearance: Normal appearance.  Skin:    Findings: Rash present. Rash is crusting and urticarial.  Neurological:     Mental Status: He is alert.     Motor: No tremor.     Gait: Gait normal.  Psychiatric:        Attention and Perception: He is inattentive.        Mood and Affect: Mood is anxious and depressed. Affect is not labile, angry or tearful.        Speech: Speech is not rapid and pressured.        Behavior: Behavior is hyperactive. Behavior is not agitated or slowed.        Thought Content: Thought content is not paranoid. Thought content does not include homicidal or suicidal ideation.        Cognition and Memory: Cognition normal.     Comments: Fair insight and judgment. Less intense today but chronically fidgety.     Lab Review:     Component Value Date/Time   NA 137 05/27/2020 0520   K 3.8 05/27/2020 0520   CL 106 05/27/2020 0520   CO2 22 05/27/2020 0520   GLUCOSE 95 05/27/2020 0520   BUN 14 05/27/2020 0520   CREATININE 1.06 05/27/2020 0520   CREATININE TEST NOT PERFORMED 09/03/2012 1527   CALCIUM 9.0 05/27/2020 0520   PROT 7.2 05/24/2020 1346   ALBUMIN 3.7 05/24/2020 1346   AST 30 05/24/2020 1346   ALT 18 05/24/2020 1346   ALKPHOS 99 05/24/2020 1346   BILITOT 0.4 05/24/2020 1346   GFRNONAA >60 05/27/2020 0520   GFRNONAA TEST NOT PERFORMED 09/03/2012 1527   GFRAA >60 05/27/2020 0520   GFRAA TEST NOT PERFORMED 09/03/2012 1527       Component Value Date/Time   WBC 10.5 05/26/2020 0438   RBC 4.46 05/26/2020 0438   HGB 12.7 (L) 05/26/2020 0438   HCT 38.8 (L)  05/26/2020 0438   PLT 299 05/26/2020 0438   MCV 87.0 05/26/2020 0438   MCH 28.5 05/26/2020 0438   MCHC 32.7 05/26/2020 0438   RDW 15.7 (H) 05/26/2020 0438   LYMPHSABS 2.2 05/24/2020 1001   MONOABS 0.7 05/24/2020 1001   EOSABS 0.1 05/24/2020 1001   BASOSABS 0.1 05/24/2020 1001    No results found for: POCLITH, LITHIUM   No results found for: PHENYTOIN, PHENOBARB, VALPROATE, CBMZ   .res Assessment: Plan:    Ansar was seen today for follow-up, attention deficit hyperactivity disorder (adhd), combined t and generalized anxiety disorder.  Diagnoses and all orders for this visit:  Generalized anxiety disorder  Attention deficit hyperactivity disorder (ADHD), combined type -     amphetamine-dextroamphetamine (ADDERALL) 20 MG tablet; 1 in AM , 1 at noon, 1/2 tablet at 4 PM  Insomnia due to mental condition -     QUEtiapine (SEROQUEL) 100 MG tablet; TAKE 2 TABLETS(200 MG) BY MOUTH AT BEDTIME  Essential hypertension  Cigarette nicotine dependence with withdrawal  Alcohol use disorder, mild, in early remission, abuse  Atypical manic disorder (HCC) -     QUEtiapine (SEROQUEL) 100 MG tablet; TAKE 2 TABLETS(200 MG) BY MOUTH AT BEDTIME     Greater than 50% of face to face time with patient was spent on counseling and coordination  of care. We discussed that Ray has been chronically disabled by severe ADD and some depression and anxiety.   Discussed at length his recent hypertensive encephalopathy which does not appear to be related to drugs or alcohol or noncompliance with clonidine by his report.  He has had no further episodes.  He was encouraged to continue to stay in contact with his doctor regularly about his hypertension.  He has not been recently agitated and therefore has discontinued olanzapine.  He does need the hydroxyzine for itching and anxiety.  Discussed potential metabolic side effects associated with atypical antipsychotics, as well as potential risk for movement  side effects. Advised pt to contact office if movement side effects occur.   Supportive therapy with focus on self care and substance abuse focus.  Maintain sobriety. Supportive therapy re: dealing with mother who hasn't been able to get the vaccine.  Stressed dealing with that.    Discussed potential benefits, risks, and side effects of stimulants with patient to include increased heart rate, palpitations, insomnia, increased anxiety, increased irritability, or decreased appetite.  Instructed patient to contact office if experiencing any significant tolerability issues.  Cont meds Prozac for depression and anxiety and Adderall for ADD  and Lyrica for chronic pain and off label for anxiety.  Also Ambien for sleep  He is having some significant insomnia and Ambien has no longer been effective.  It appears he is tolerant.  Because his insomnia is so significant and unlikely to respond to other agents and he has failed other agents we will continue quetiapine.  We discussed how that could also be used if tolerated a higher dose in order to treat depression and anxiety.   Continue quetiapine 200 mg nightly.  It has helped him sleep much better. We discussed side effects in detail including fall risk. Call if needed for higher dose which could help mood anxiety and sleep and stability.  No med change  This appt was 30 mins.  Fu 2-3 mos  Lynder Parents, MD, DFAPA   Please see After Visit Summary for patient specific instructions.  No future appointments.  No orders of the defined types were placed in this encounter.     -------------------------------

## 2021-02-16 ENCOUNTER — Other Ambulatory Visit: Payer: Self-pay | Admitting: Psychiatry

## 2021-02-16 DIAGNOSIS — F411 Generalized anxiety disorder: Secondary | ICD-10-CM

## 2021-02-22 ENCOUNTER — Other Ambulatory Visit: Payer: Self-pay

## 2021-02-22 ENCOUNTER — Telehealth: Payer: Self-pay | Admitting: Psychiatry

## 2021-02-22 DIAGNOSIS — F902 Attention-deficit hyperactivity disorder, combined type: Secondary | ICD-10-CM

## 2021-02-22 NOTE — Telephone Encounter (Signed)
Pt called and needs a refill on his adderall 20 mg to be sent to the [facility] on [address]. Next appt 7/7

## 2021-02-22 NOTE — Telephone Encounter (Signed)
pended

## 2021-02-25 MED ORDER — AMPHETAMINE-DEXTROAMPHETAMINE 20 MG PO TABS: ORAL_TABLET | ORAL | 0 refills | Status: DC

## 2021-03-21 ENCOUNTER — Other Ambulatory Visit: Payer: Self-pay

## 2021-03-21 ENCOUNTER — Encounter: Payer: Self-pay | Admitting: Psychiatry

## 2021-03-21 ENCOUNTER — Ambulatory Visit (INDEPENDENT_AMBULATORY_CARE_PROVIDER_SITE_OTHER): Payer: Medicare Other | Admitting: Psychiatry

## 2021-03-21 VITALS — BP 118/59 | HR 102

## 2021-03-21 DIAGNOSIS — F411 Generalized anxiety disorder: Secondary | ICD-10-CM

## 2021-03-21 DIAGNOSIS — F5105 Insomnia due to other mental disorder: Secondary | ICD-10-CM | POA: Diagnosis not present

## 2021-03-21 DIAGNOSIS — F902 Attention-deficit hyperactivity disorder, combined type: Secondary | ICD-10-CM

## 2021-03-21 DIAGNOSIS — F308 Other manic episodes: Secondary | ICD-10-CM | POA: Diagnosis not present

## 2021-03-21 MED ORDER — QUETIAPINE FUMARATE 100 MG PO TABS: ORAL_TABLET | ORAL | 0 refills | Status: DC

## 2021-03-21 MED ORDER — FLUOXETINE HCL 20 MG PO CAPS: ORAL_CAPSULE | ORAL | 1 refills | Status: DC

## 2021-03-21 MED ORDER — AMPHETAMINE-DEXTROAMPHETAMINE 20 MG PO TABS: ORAL_TABLET | ORAL | 0 refills | Status: DC

## 2021-03-21 NOTE — Progress Notes (Signed)
DEUNDRA BARD [id number] [birth year] 65 y.o.  Subjective:   Patient ID:  David [name] is a 66 y.o. (DOB 1956/05/11) male.  Chief Complaint:  Chief Complaint  Patient presents with   Follow-up   Anxiety   Stress    Hypertension Associated symptoms include anxiety. Pertinent negatives include no palpitations.  Anxiety Symptoms include decreased concentration and nervous/anxious behavior. Patient reports no confusion, palpitations or suicidal ideas.    Depression        Associated symptoms include decreased concentration.  Associated symptoms include no fatigue and no suicidal ideas.  Past medical history includes anxiety.   [name] presents to the office today for follow-up of anxiety depression and ADD.  seen October 25, 2019.  No meds were changed  Had accident and fractured leg Jun 23, 2018 and had to have surgery with complications and incomplete recovery. .Pain is better and can walk without assistance now.  But can't eat bc all teeth pulled and had complications from that.  Still dealing with that.  Dentures not right. Has had a couple of infections that were in the skin and one cancer of skin.  Health has been a real drag on his mood over the last 18 mos.     Not as much things to enjoy as he'd like.  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    As of appointment January 03, 2020 he reports the following: Moved appt up.  Nephew in [place] and [name] is all he has.  Moving to [place]. Just finished prednisone.Was wired on it.  Wife noted it also.  Couldn't sleep and amped up.   Havent' seen mother in a while DT Covid.  M is amazingly still alive.  [name] has been supportive.   Has panic every 6-8 weeks and will have to leave the building.  Wife also has panic and drinking to deal with thte panic.   Haven't smoked in 3 mos and pleased with that but it's still hard.   No drugs.  Not hard.  Less drinking significantly.  He has cut back because wife's drinking is bothering  him.  Says he doing well with sobriety but still drinks regularly.  Denies getting drunk.  Disc risk of falling.  [name] died in motorcycle MVA 05/13/19.  Very upset.  Was planning to go to [place] with him.  Plan no med changes  06/07/20 TC: Patient had hypertensive crisis with altered mental status on 05/24/2020 and was admitted to the hospital.  Was instructed to discontinue Adderall until blood pressure was better controlled. Seen by PCP on 06/04/2020 with blood pressure 160/90 and started on olmesartan.  Clonidine was discontinued in the hospital because it was felt he had may have been noncompliant with the clonidine causing the hypertensive reaction. Patient needs to come to the office to have his blood pressure checked and it needs to be approximately 140/90 or better in order to resume the normal dose of Adderall.  Will not send in Adderall RX today   06/15/20 TC: Ray came in today for a BP check which was requested by Dr. [name] in order to refill his Adderall. Did first BP reading 161/92 with pulse 104. Stopped and waited two minutes and did second BP-  136/87 with pulse 99. If appropriate I told him we would call him to let him know if we can fill his Adderall. Sent in Adderall at lower dose   06/25/20 appointment with the following  noted:  BP med changed to olmesartan and stopped clonidine. [name]. Some problems with depression and anxiety lately with stressors.  Itching drives him crazy from psoriasis. Don't sleep worth a damn. Ambien only works for a couple of hours. Again discussed recent hospitalization for hypertensive encephalopathy.  He strongly denies using any cocaine or abusing the Adderall.  He admits to some ongoing intermittent marijuana use but no other illicit drugs.  He does not recall any noncompliance with clonidine. He was not discharged on any hypertensive meds and has started olmesartan from his primary care doc but it does not appear to be fully  controlling his blood pressure.  He was encouraged to continue to talk to his primary care doctor about further med adjustments. He has restarted a lower dose of Adderall 20 mg 3 times daily instead of 30 mg 3 times daily.  His pulse is borderline high as noted.  He does not have any symptoms related to either hypertension or tachycardia.  Plan: Start quetiapine 50 to 100 mg nightly.  08/08/2020 appointment with following noted: Psoriasis is driving me crazy.  Will show up at derm over it trying to get help.  Dupixent didn't help psoriasis after 4 mos. Quetiapine is helping some but psoriasis is interfering with his whole life. Thinks he ran out of quetiapine.  10/04/2020 appt noted: December 13 pulled over.  Lost license and debit card.  Was told license revoked for failure to appear at court in April.  Had seen an attorney over it and the charges were dismissed and he had been told this.  But clerical problems kept him from getting license. Off and on urinary urgency.  Ongoing chronic stress.  Affect sleep and anxiety levels mainly.  Not profoundly depressed.  Denies significant manic symptoms.  Denies alcohol abuse or heart drug use.  Some marijuana use Plan: Increase to quetiapine 200 mg nightly.   01/21/2021 appointment with the following noted: Still dealing with teeth issues and hasn't been able to get false teeth back.  Top medical concern. 08/27/20 legal issue is still unresolved also.  Blew 0.02 twice and then had blood drawn.  Denies being drunk.  Feels like he was charged bc speech affected by not having teeth.   Cost of legal aspect is frustrating. Doing ok with meds.  Tolerating and benefits. Plan: No med changes  03/21/2021 appointment with the following noted: Attorney has not been very responsive on his legal problem.  Feels this is an injustice. New court date 04/11/21.  Sleeps with quetiapine. Not depressed but ongoing stress and anxiety problems. Denies substance abuse.  M  65 yo.  Past Psychiatric Medication Trials: Fluoxetine 40, duloxetine,  Wellbutrin, venlafaxine Olanzapine 10, quetiapine, clonidine, hydroxyzine, Lyrica,trazodone, Ambien,  testosterone,  Adderall,  vitamin D,  , Has been under the care of this practice since November 2000  Review of Systems:  Review of Systems  Constitutional:  Negative for fatigue.  HENT:  Negative for dental problem.   Cardiovascular:  Negative for palpitations.  Musculoskeletal:  Positive for arthralgias and back pain. Negative for gait problem.  Skin:  Positive for rash.       Severe itching  Neurological:  Negative for tremors.  Psychiatric/Behavioral:  Positive for decreased concentration, dysphoric mood and sleep disturbance. Negative for agitation, behavioral problems, confusion, hallucinations, self-injury and suicidal ideas. The patient is nervous/anxious. The patient is not hyperactive.    Medications: I have reviewed the patient's current medications.  Current Outpatient Medications  Medication Sig Dispense Refill   [START ON 04/18/2021] amphetamine-dextroamphetamine (ADDERALL) 20 MG tablet 1 in the AM and Noon and 1/2 at 4 PM 75 tablet 0   [START ON 05/16/2021] amphetamine-dextroamphetamine (ADDERALL) 20 MG tablet 1 in the AM and Noon and 1/2 at 4 PM 75 tablet 0   cetaphil (CETAPHIL) lotion Apply 1 application topically as needed for dry skin.      hydrOXYzine (ATARAX/VISTARIL) 25 MG tablet TAKE 1 TABLET(25 MG) BY MOUTH EVERY 6 HOURS AS NEEDED FOR ITCHING OR ANXIETY 120 tablet 1   pregabalin (LYRICA) 150 MG capsule Take 1 capsule (150 mg total) by mouth 2 (two) times daily. 60 capsule 5   triamcinolone ointment (KENALOG) 0.1 % Apply 1 application topically See admin instructions. Apply to affected area twice daily for 2 weeks on then 1 week off as needed for flare ups     zolpidem (AMBIEN) 10 MG tablet 1 to 1-1/2 tablets at night as needed for sleep. 45 tablet 3   amphetamine-dextroamphetamine (ADDERALL) 20  MG tablet 1 in AM , 1 at noon, 1/2 tablet at 4 PM 75 tablet 0   DUPIXENT 300 MG/2ML prefilled syringe Inject 300 mg into the skin every 14 (fourteen) days. (Patient not taking: No sig reported)     FLUoxetine (PROZAC) 20 MG capsule TAKE 2 CAPSULES(40 MG) BY MOUTH DAILY 180 capsule 1   levocetirizine (XYZAL) 5 MG tablet Take 5 mg by mouth at bedtime as needed for allergies or itching.     ondansetron (ZOFRAN) 4 MG tablet TAKE 1 TABLET(4 MG) BY MOUTH EVERY 8 HOURS AS NEEDED FOR NAUSEA OR VOMITING (Patient not taking: No sig reported) 30 tablet 0   QUEtiapine (SEROQUEL) 100 MG tablet TAKE 2 TABLETS(200 MG) BY MOUTH AT BEDTIME 180 tablet 0   No current facility-administered medications for this visit.    Medication Side Effects: None  Allergies:  Allergies  Allergen Reactions   Codeine Nausea Only    Past Medical History:  Diagnosis Date   ADD (attention deficit disorder)    Anemia    Microcytic   Arthritis    Bipolar disorder (HCC)    Depression    Dyspnea    history of no current issues 05/26/2019   GAD (generalized anxiety disorder)    History of kidney stones    passed   HTN (hypertension)    PTSD (post-traumatic stress disorder)    Skin cancer     Family History  Problem Relation Age of Onset   Arthritis Other    Heart disease Other    Cancer Other    Hypertension Other     Social History   Socioeconomic History   Marital status: Married    Spouse name: Not on file   Number of children: Not on file   Years of education: Not on file   Highest education level: Not on file  Occupational History   Not on file  Tobacco Use   Smoking status: Every Day    Packs/day: 1.00    Years: 40.00    Pack years: 40.00    Types: Cigarettes    Last attempt to quit: 04/25/2019    Years since quitting: 1.9   Smokeless tobacco: Never  Vaping Use   Vaping Use: Never used  Substance and Sexual Activity   Alcohol use: Yes    Comment: daily-6-7/day   Drug use: Not Currently     Types: Marijuana   Sexual activity: Not on file  Other Topics  Concern   Not on file  Social History Narrative   Not on file   Social Determinants of Health   Financial Resource Strain: Not on file  Food Insecurity: Not on file  Transportation Needs: Not on file  Physical Activity: Not on file  Stress: Not on file  Social Connections: Not on file  Intimate Partner Violence: Not on file    Past Medical History, Surgical history, Social history, and Family history were reviewed and updated as appropriate.   Please see review of systems for further details on the patient's review from today.   Objective:   Physical Exam:  BP (!) 118/59   Pulse (!) 102   Physical Exam Constitutional:      Appearance: Normal appearance.  Skin:    Findings: Rash present. Rash is crusting and urticarial.  Neurological:     Mental Status: He is alert.     Motor: No tremor.     Gait: Gait normal.  Psychiatric:        Attention and Perception: He is inattentive.        Mood and Affect: Mood is anxious and depressed. Affect is not labile, angry or tearful.        Speech: Speech is not rapid and pressured.        Behavior: Behavior is hyperactive. Behavior is not agitated or slowed.        Thought Content: Thought content is not paranoid. Thought content does not include homicidal or suicidal ideation.        Cognition and Memory: Cognition normal.     Comments: Fair insight and judgment. chronically fidgety. Ongoing stress No pressure    Lab Review:     Component Value Date/Time   NA 137 05/27/2020 0520   K 3.8 05/27/2020 0520   CL 106 05/27/2020 0520   CO2 22 05/27/2020 0520   GLUCOSE 95 05/27/2020 0520   BUN 14 05/27/2020 0520   CREATININE 1.06 05/27/2020 0520   CREATININE TEST NOT PERFORMED 09/03/2012 1527   CALCIUM 9.0 05/27/2020 0520   PROT 7.2 05/24/2020 1346   ALBUMIN 3.7 05/24/2020 1346   AST 30 05/24/2020 1346   ALT 18 05/24/2020 1346   ALKPHOS 99 05/24/2020 1346    BILITOT 0.4 05/24/2020 1346   GFRNONAA >60 05/27/2020 0520   GFRNONAA TEST NOT PERFORMED 09/03/2012 1527   GFRAA >60 05/27/2020 0520   GFRAA TEST NOT PERFORMED 09/03/2012 1527       Component Value Date/Time   WBC 10.5 05/26/2020 0438   RBC 4.46 05/26/2020 0438   HGB 12.7 (L) 05/26/2020 0438   HCT 38.8 (L) 05/26/2020 0438   PLT 299 05/26/2020 0438   MCV 87.0 05/26/2020 0438   MCH 28.5 05/26/2020 0438   MCHC 32.7 05/26/2020 0438   RDW 15.7 (H) 05/26/2020 0438   LYMPHSABS 2.2 05/24/2020 1001   MONOABS 0.7 05/24/2020 1001   EOSABS 0.1 05/24/2020 1001   BASOSABS 0.1 05/24/2020 1001    No results found for: POCLITH, LITHIUM   No results found for: PHENYTOIN, PHENOBARB, VALPROATE, CBMZ   .res Assessment: Plan:    Roderic was seen today for follow-up, anxiety and stress.  Diagnoses and all orders for this visit:  Generalized anxiety disorder -     FLUoxetine (PROZAC) 20 MG capsule; TAKE 2 CAPSULES(40 MG) BY MOUTH DAILY  Attention deficit hyperactivity disorder (ADHD), combined type -     amphetamine-dextroamphetamine (ADDERALL) 20 MG tablet; 1 in AM , 1 at noon, 1/2 tablet  at 4 PM -     amphetamine-dextroamphetamine (ADDERALL) 20 MG tablet; 1 in the AM and Noon and 1/2 at 4 PM -     amphetamine-dextroamphetamine (ADDERALL) 20 MG tablet; 1 in the AM and Noon and 1/2 at 4 PM  Insomnia due to mental condition -     QUEtiapine (SEROQUEL) 100 MG tablet; TAKE 2 TABLETS(200 MG) BY MOUTH AT BEDTIME  Atypical manic disorder (HCC) -     QUEtiapine (SEROQUEL) 100 MG tablet; TAKE 2 TABLETS(200 MG) BY MOUTH AT BEDTIME    Greater than 50% of face to face time with patient was spent on counseling and coordination of care. We discussed that Ray has been chronically disabled by severe ADD and some depression and anxiety.   Discussed at length his recent hypertensive encephalopathy which does not appear to be related to drugs or alcohol or noncompliance with clonidine by his report.  He  has had no further episodes.  He was encouraged to continue to stay in contact with his doctor regularly about his hypertension.  He does need the hydroxyzine for itching and anxiety.  Supportive therapy with focus on self care and substance abuse focus.  Maintain sobriety. Supportive therapy re: dealing with mother who hasn't been able to get the vaccine.  Stressed dealing with that.    Discussed potential benefits, risks, and side effects of stimulants with patient to include increased heart rate, palpitations, insomnia, increased anxiety, increased irritability, or decreased appetite.  Instructed patient to contact office if experiencing any significant tolerability issues.  Cont meds Prozac for depression and anxiety and Adderall for ADD  and Lyrica for chronic pain and off label for anxiety.  Also Ambien for sleep, but rec try wean while on quetiapine which is likely sufficient for sleep.  He is having some significant insomnia and Ambien has no longer been effective.  It appears he is tolerant.  Because his insomnia is so significant and unlikely to respond to other agents and he has failed other agents we will continue quetiapine.  We discussed how that could also be used if tolerated a higher dose in order to treat depression and anxiety.   Continue quetiapine 200 mg nightly.  It has helped him sleep much better. We discussed side effects in detail including fall risk.  Disc mood effects and other FDA indications for quetiapine. Call if needed for higher dose which could help mood anxiety and sleep and stability.  Discussed potential metabolic side effects associated with atypical antipsychotics, as well as potential risk for movement side effects. Advised pt to contact office if movement side effects occur.  No med change otherwise  This appt was 30 mins.  Fu 2-3 mos  Lynder Parents, MD, DFAPA   Please see After Visit Summary for patient specific instructions.  No future  appointments.  No orders of the defined types were placed in this encounter.     -------------------------------

## 2021-04-13 ENCOUNTER — Other Ambulatory Visit: Payer: Self-pay | Admitting: Psychiatry

## 2021-04-13 DIAGNOSIS — F411 Generalized anxiety disorder: Secondary | ICD-10-CM

## 2021-05-07 ENCOUNTER — Telehealth: Payer: Self-pay | Admitting: Psychiatry

## 2021-05-07 NOTE — Telephone Encounter (Signed)
Pt needs refill for Quetiapine '100mg'$ . States he has been having trouble with his pharmacy and would like prescription sent to [facility]. Ph: [phone]. Appt 10/11

## 2021-05-08 ENCOUNTER — Other Ambulatory Visit: Payer: Self-pay

## 2021-05-08 DIAGNOSIS — F308 Other manic episodes: Secondary | ICD-10-CM

## 2021-05-08 DIAGNOSIS — F5105 Insomnia due to other mental disorder: Secondary | ICD-10-CM

## 2021-05-08 MED ORDER — QUETIAPINE FUMARATE 100 MG PO TABS: ORAL_TABLET | ORAL | 0 refills | Status: DC

## 2021-05-08 NOTE — Telephone Encounter (Signed)
Rx sent 

## 2021-05-09 ENCOUNTER — Other Ambulatory Visit: Payer: Self-pay | Admitting: Psychiatry

## 2021-05-09 DIAGNOSIS — F308 Other manic episodes: Secondary | ICD-10-CM

## 2021-05-09 DIAGNOSIS — F5105 Insomnia due to other mental disorder: Secondary | ICD-10-CM

## 2021-05-24 ENCOUNTER — Telehealth: Payer: Self-pay | Admitting: Psychiatry

## 2021-05-24 ENCOUNTER — Other Ambulatory Visit: Payer: Self-pay

## 2021-05-24 DIAGNOSIS — F902 Attention-deficit hyperactivity disorder, combined type: Secondary | ICD-10-CM

## 2021-05-24 MED ORDER — ZOLPIDEM TARTRATE 10 MG PO TABS: ORAL_TABLET | ORAL | 3 refills | Status: DC

## 2021-05-24 MED ORDER — AMPHETAMINE-DEXTROAMPHETAMINE 20 MG PO TABS
ORAL_TABLET | ORAL | 0 refills | Status: DC
Start: 2021-05-24 — End: 2021-06-25

## 2021-05-24 NOTE — Telephone Encounter (Signed)
Pt  LM on VM requesting refills for Adderall & Ambien @ [facility].  [facility]. No longer using [facility]. Apt 10/11

## 2021-05-24 NOTE — Telephone Encounter (Signed)
Pended.

## 2021-06-03 ENCOUNTER — Encounter: Payer: Self-pay | Admitting: Psychiatry

## 2021-06-03 ENCOUNTER — Other Ambulatory Visit: Payer: Self-pay

## 2021-06-03 ENCOUNTER — Ambulatory Visit (INDEPENDENT_AMBULATORY_CARE_PROVIDER_SITE_OTHER): Payer: Medicare Other | Admitting: Psychiatry

## 2021-06-03 VITALS — BP 167/88 | HR 104

## 2021-06-03 DIAGNOSIS — F902 Attention-deficit hyperactivity disorder, combined type: Secondary | ICD-10-CM | POA: Diagnosis not present

## 2021-06-03 DIAGNOSIS — F411 Generalized anxiety disorder: Secondary | ICD-10-CM

## 2021-06-03 DIAGNOSIS — F5105 Insomnia due to other mental disorder: Secondary | ICD-10-CM

## 2021-06-03 DIAGNOSIS — F308 Other manic episodes: Secondary | ICD-10-CM

## 2021-06-03 MED ORDER — QUETIAPINE FUMARATE 200 MG PO TABS: 600.0000 mg | ORAL_TABLET | Freq: Every day | ORAL | 0 refills | Status: DC

## 2021-06-03 NOTE — Progress Notes (Signed)
David [name] [id number] June 14, 1956 65 y.o.  Subjective:   Patient ID:  David [name] is a 65 y.o. (DOB Aug 25, 1956) male.  Chief Complaint:  Chief Complaint  Patient presents with   Follow-up   ADHD   Anxiety   Stress    Hypertension Associated symptoms include anxiety. Pertinent negatives include no palpitations.  Anxiety Symptoms include decreased concentration and nervous/anxious behavior. Patient reports no confusion, dizziness, palpitations or suicidal ideas.    Depression        Associated symptoms include decreased concentration.  Associated symptoms include no fatigue and no suicidal ideas.  Past medical history includes anxiety.   David [name] presents to the office today for follow-up of anxiety depression and ADD.  seen October 25, 2019.  No meds were changed  Had accident and fractured leg Jun 23, 2018 and had to have surgery with complications and incomplete recovery. .Pain is better and can walk without assistance now.  But can't eat bc all teeth pulled and had complications from that.  Still dealing with that.  Dentures not right. Has had a couple of infections that were in the skin and one cancer of skin.  Health has been a real drag on his mood over the last 18 mos.     Not as much things to enjoy as he'd like.  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    As of appointment January 03, 2020 he reports the following: Moved appt up.  Nephew in [place] and [name] is all he has.  Moving to [place]. Just finished prednisone.Was wired on it.  Wife noted it also.  Couldn't sleep and amped up.   Havent' seen mother in a while DT Covid.  M is amazingly still alive.  [name] has been supportive.   Has panic every 6-8 weeks and will have to leave the building.  Wife also has panic and drinking to deal with thte panic.   Haven't smoked in 3 mos and pleased with that but it's still hard.   No drugs.  Not hard.  Less drinking significantly.  He has cut back because wife's  drinking is bothering him.  Says he doing well with sobriety but still drinks regularly.  Denies getting drunk.  Disc risk of falling.  [name] died in motorcycle MVA 2019/04/27.  Very upset.  Was planning to go to [place] with him.  Plan no med changes  06/07/20 TC: Patient had hypertensive crisis with altered mental status on 05/24/2020 and was admitted to the hospital.  Was instructed to discontinue Adderall until blood pressure was better controlled. Seen by PCP on 06/04/2020 with blood pressure 160/90 and started on olmesartan.  Clonidine was discontinued in the hospital because it was felt he had may have been noncompliant with the clonidine causing the hypertensive reaction. Patient needs to come to the office to have his blood pressure checked and it needs to be approximately 140/90 or better in order to resume the normal dose of Adderall.  Will not send in Adderall RX today   06/15/20 TC: Ray came in today for a BP check which was requested by Dr. [name] in order to refill his Adderall. Did first BP reading 161/92 with pulse 104. Stopped and waited two minutes and did second BP-  136/87 with pulse 99. If appropriate I told him we would call him to let him know if we can fill his Adderall. Sent in Adderall at lower dose   06/25/20  appointment with the following noted:  BP med changed to olmesartan and stopped clonidine. [name]. Some problems with depression and anxiety lately with stressors.  Itching drives him crazy from psoriasis. Don't sleep worth a damn. Ambien only works for a couple of hours. Again discussed recent hospitalization for hypertensive encephalopathy.  He strongly denies using any cocaine or abusing the Adderall.  He admits to some ongoing intermittent marijuana use but no other illicit drugs.  He does not recall any noncompliance with clonidine. He was not discharged on any hypertensive meds and has started olmesartan from his primary care doc but it does  not appear to be fully controlling his blood pressure.  He was encouraged to continue to talk to his primary care doctor about further med adjustments. He has restarted a lower dose of Adderall 20 mg 3 times daily instead of 30 mg 3 times daily.  His pulse is borderline high as noted.  He does not have any symptoms related to either hypertension or tachycardia.  Plan: Start quetiapine 50 to 100 mg nightly.  08/08/2020 appointment with following noted: Psoriasis is driving me crazy.  Will show up at derm over it trying to get help.  Dupixent didn't help psoriasis after 4 mos. Quetiapine is helping some but psoriasis is interfering with his whole life. Thinks he ran out of quetiapine.  10/04/2020 appt noted: December 13 pulled over.  Lost license and debit card.  Was told license revoked for failure to appear at court in April.  Had seen an attorney over it and the charges were dismissed and he had been told this.  But clerical problems kept him from getting license. Off and on urinary urgency.  Ongoing chronic stress.  Affect sleep and anxiety levels mainly.  Not profoundly depressed.  Denies significant manic symptoms.  Denies alcohol abuse or heart drug use.  Some marijuana use Plan: Increase to quetiapine 200 mg nightly.   01/21/2021 appointment with the following noted: Still dealing with teeth issues and hasn't been able to get false teeth back.  Top medical concern. 08/27/20 legal issue is still unresolved also.  Blew 0.02 twice and then had blood drawn.  Denies being drunk.  Feels like he was charged bc speech affected by not having teeth.   Cost of legal aspect is frustrating. Doing ok with meds.  Tolerating and benefits. Plan: No med changes  03/21/2021 appointment with the following noted: Attorney has not been very responsive on his legal problem.  Feels this is an injustice. New court date 04/11/21.  Sleeps with quetiapine. Not depressed but ongoing stress and anxiety problems. Denies  substance abuse. Plan no med changes  06/03/2021 appointment with the following noted: I'm OK.  B in-law suicided recently and they were close. Has used up to quetiapine 600 mg for sleep. Court Thursday upcoming. Still dealing with care-taking mother. No cocaine in a long time.  M 5 yo.  Past Psychiatric Medication Trials: Fluoxetine 40, duloxetine,  Wellbutrin, venlafaxine Olanzapine 10, quetiapine, clonidine, hydroxyzine, Lyrica,trazodone, Ambien,  testosterone,  Adderall,  vitamin D,  , Has been under the care of this practice since November 2000  Review of Systems:  Review of Systems  Constitutional:  Negative for fatigue.  HENT:  Negative for dental problem.   Cardiovascular:  Negative for palpitations.  Musculoskeletal:  Positive for arthralgias and back pain. Negative for gait problem.  Skin:  Positive for rash.       Severe itching  Neurological:  Negative for  dizziness and tremors.  Psychiatric/Behavioral:  Positive for decreased concentration, dysphoric mood and sleep disturbance. Negative for agitation, behavioral problems, confusion, hallucinations, self-injury and suicidal ideas. The patient is nervous/anxious. The patient is not hyperactive.    Medications: I have reviewed the patient's current medications.  Current Outpatient Medications  Medication Sig Dispense Refill   amphetamine-dextroamphetamine (ADDERALL) 20 MG tablet 1 in the AM and Noon and 1/2 at 4 PM 75 tablet 0   amphetamine-dextroamphetamine (ADDERALL) 20 MG tablet 1 in the AM and Noon and 1/2 at 4 PM 75 tablet 0   amphetamine-dextroamphetamine (ADDERALL) 20 MG tablet 1 in AM , 1 at noon, 1/2 tablet at 4 PM 75 tablet 0   cetaphil (CETAPHIL) lotion Apply 1 application topically as needed for dry skin.      FLUoxetine (PROZAC) 20 MG capsule TAKE 2 CAPSULES(40 MG) BY MOUTH DAILY 180 capsule 1   hydrOXYzine (ATARAX/VISTARIL) 25 MG tablet TAKE 1 TABLET(25 MG) BY MOUTH EVERY 6 HOURS AS NEEDED FOR ITCHING OR  ANXIETY 120 tablet 1   levocetirizine (XYZAL) 5 MG tablet Take 5 mg by mouth at bedtime as needed for allergies or itching.     pregabalin (LYRICA) 150 MG capsule Take 1 capsule (150 mg total) by mouth 2 (two) times daily. 60 capsule 5   Risankizumab-rzaa (SKYRIZI, 150 MG DOSE, ) Inject into the skin.     triamcinolone ointment (KENALOG) 0.1 % Apply 1 application topically See admin instructions. Apply to affected area twice daily for 2 weeks on then 1 week off as needed for flare ups     zolpidem (AMBIEN) 10 MG tablet 1 to 1-1/2 tablets at night as needed for sleep. 45 tablet 3   ondansetron (ZOFRAN) 4 MG tablet TAKE 1 TABLET(4 MG) BY MOUTH EVERY 8 HOURS AS NEEDED FOR NAUSEA OR VOMITING (Patient not taking: No sig reported) 30 tablet 0   QUEtiapine (SEROQUEL) 200 MG tablet Take 3 tablets (600 mg total) by mouth at bedtime. TAKE 2 TABLETS(200 MG) BY MOUTH AT BEDTIME 270 tablet 0   No current facility-administered medications for this visit.    Medication Side Effects: None  Allergies:  Allergies  Allergen Reactions   Codeine Nausea Only    Past Medical History:  Diagnosis Date   ADD (attention deficit disorder)    Anemia    Microcytic   Arthritis    Bipolar disorder (HCC)    Depression    Dyspnea    history of no current issues 05/26/2019   GAD (generalized anxiety disorder)    History of kidney stones    passed   HTN (hypertension)    PTSD (post-traumatic stress disorder)    Skin cancer     Family History  Problem Relation Age of Onset   Arthritis Other    Heart disease Other    Cancer Other    Hypertension Other     Social History   Socioeconomic History   Marital status: Married    Spouse name: Not on file   Number of children: Not on file   Years of education: Not on file   Highest education level: Not on file  Occupational History   Not on file  Tobacco Use   Smoking status: Every Day    Packs/day: 1.00    Years: 40.00    Pack years: 40.00    Types:  Cigarettes    Last attempt to quit: 04/25/2019    Years since quitting: 2.1   Smokeless tobacco: Never  Vaping Use   Vaping Use: Never used  Substance and Sexual Activity   Alcohol use: Yes    Comment: daily-6-7/day   Drug use: Not Currently    Types: Marijuana   Sexual activity: Not on file  Other Topics Concern   Not on file  Social History Narrative   Not on file   Social Determinants of Health   Financial Resource Strain: Not on file  Food Insecurity: Not on file  Transportation Needs: Not on file  Physical Activity: Not on file  Stress: Not on file  Social Connections: Not on file  Intimate Partner Violence: Not on file    Past Medical History, Surgical history, Social history, and Family history were reviewed and updated as appropriate.   Please see review of systems for further details on the patient's review from today.   Objective:   Physical Exam:  BP (!) 167/88   Pulse (!) 104   Physical Exam Constitutional:      Appearance: Normal appearance.  Skin:    Findings: Rash present. Rash is crusting and urticarial.  Neurological:     Mental Status: He is alert.     Motor: No tremor.     Gait: Gait normal.  Psychiatric:        Attention and Perception: He is inattentive.        Mood and Affect: Mood is anxious. Mood is not depressed. Affect is not labile, angry or tearful.        Speech: Speech is rapid and pressured.        Behavior: Behavior is hyperactive. Behavior is not agitated or slowed.        Thought Content: Thought content is not paranoid. Thought content does not include homicidal or suicidal ideation.        Cognition and Memory: Cognition normal.     Comments: Fair insight and judgment. chronically fidgety. Ongoing stress No pressure    Lab Review:     Component Value Date/Time   NA 137 05/27/2020 0520   K 3.8 05/27/2020 0520   CL 106 05/27/2020 0520   CO2 22 05/27/2020 0520   GLUCOSE 95 05/27/2020 0520   BUN 14 05/27/2020 0520    CREATININE 1.06 05/27/2020 0520   CREATININE TEST NOT PERFORMED 09/03/2012 1527   CALCIUM 9.0 05/27/2020 0520   PROT 7.2 05/24/2020 1346   ALBUMIN 3.7 05/24/2020 1346   AST 30 05/24/2020 1346   ALT 18 05/24/2020 1346   ALKPHOS 99 05/24/2020 1346   BILITOT 0.4 05/24/2020 1346   GFRNONAA >60 05/27/2020 0520   GFRNONAA TEST NOT PERFORMED 09/03/2012 1527   GFRAA >60 05/27/2020 0520   GFRAA TEST NOT PERFORMED 09/03/2012 1527       Component Value Date/Time   WBC 10.5 05/26/2020 0438   RBC 4.46 05/26/2020 0438   HGB 12.7 (L) 05/26/2020 0438   HCT 38.8 (L) 05/26/2020 0438   PLT 299 05/26/2020 0438   MCV 87.0 05/26/2020 0438   MCH 28.5 05/26/2020 0438   MCHC 32.7 05/26/2020 0438   RDW 15.7 (H) 05/26/2020 0438   LYMPHSABS 2.2 05/24/2020 1001   MONOABS 0.7 05/24/2020 1001   EOSABS 0.1 05/24/2020 1001   BASOSABS 0.1 05/24/2020 1001    No results found for: POCLITH, LITHIUM   No results found for: PHENYTOIN, PHENOBARB, VALPROATE, CBMZ   .res Assessment: Plan:    Lysle was seen today for follow-up, adhd, anxiety and stress.  Diagnoses and all orders for this visit:  Generalized  anxiety disorder  Insomnia due to mental condition -     QUEtiapine (SEROQUEL) 200 MG tablet; Take 3 tablets (600 mg total) by mouth at bedtime. TAKE 2 TABLETS(200 MG) BY MOUTH AT BEDTIME  Atypical manic disorder (HCC) -     QUEtiapine (SEROQUEL) 200 MG tablet; Take 3 tablets (600 mg total) by mouth at bedtime. TAKE 2 TABLETS(200 MG) BY MOUTH AT BEDTIME  Attention deficit hyperactivity disorder (ADHD), combined type    Greater than 50% of face to face time with patient was spent on counseling and coordination of care. We discussed that Ray has been chronically disabled by severe ADD and some depression and anxiety.   Discussed at length his recent hypertensive encephalopathy which does not appear to be related to drugs or alcohol or noncompliance with clonidine by his report.  He has had no further  episodes.  He was encouraged to continue to stay in contact with his doctor regularly about his hypertension.  He does need the hydroxyzine for itching and anxiety.  Supportive therapy with focus on self care and substance abuse focus.  Maintain sobriety. Supportive therapy re: dealing with mother who hasn't been able to get the vaccine.  Stressed dealing with that.  Also facing court date for DUI for which he feels innocent and reportedly did not blow above legal limit.  Discussed potential benefits, risks, and side effects of stimulants with patient to include increased heart rate, palpitations, insomnia, increased anxiety, increased irritability, or decreased appetite.  Instructed patient to contact office if experiencing any significant tolerability issues.  Cont meds Prozac for depression and anxiety and Adderall for ADD  and Lyrica for chronic pain and off label for anxiety.  Also Ambien for sleep, but rec try wean while on quetiapine which is likely sufficient for sleep.  iscussed how that could also be used if tolerated a higher dose in order to treat depression and anxiety.   quetiapine 200 mg nightly.  It has helped him sleep better at times. We discussed side effects in detail including fall risk.  Disc mood effects and other FDA indications for quetiapine. Call if needed for higher dose which could help mood anxiety and sleep and stability.  Discussed potential metabolic side effects associated with atypical antipsychotics, as well as potential risk for movement side effects. Advised pt to contact office if movement side effects occur.   Increase quetiapine 400-600 HS for anxiety and insomnia and mood sx Continue fluoxetine 40 mg daily. Consider decrease Continue Adderall 60 mg daily,  continue hydroxyzine 25 mg every 6 hours as needed,  continue Lyrica 150 mg twice daily for pain and anxiety,  continue Ambien 15 mg nightly  This appt was 30 mins.  Fu 2 mos  Lynder Parents, MD,  DFAP[facility]   Please see After Visit Summary for patient specific instructions.  Future Appointments  Date Time Provider [place]  06/25/2021  2:30 PM Cottle, Billey Co., MD [facility] None  09/04/2021  4:30 PM Cottle, Billey Co., MD [facility] None    No orders of the defined types were placed in this encounter.     -------------------------------

## 2021-06-25 ENCOUNTER — Ambulatory Visit: Payer: Medicare Other | Admitting: Psychiatry

## 2021-06-25 ENCOUNTER — Encounter: Payer: Self-pay | Admitting: Psychiatry

## 2021-06-25 ENCOUNTER — Other Ambulatory Visit: Payer: Self-pay

## 2021-06-25 DIAGNOSIS — F1011 Alcohol abuse, in remission: Secondary | ICD-10-CM

## 2021-06-25 DIAGNOSIS — G8929 Other chronic pain: Secondary | ICD-10-CM

## 2021-06-25 DIAGNOSIS — F411 Generalized anxiety disorder: Secondary | ICD-10-CM

## 2021-06-25 DIAGNOSIS — F308 Other manic episodes: Secondary | ICD-10-CM

## 2021-06-25 DIAGNOSIS — F5105 Insomnia due to other mental disorder: Secondary | ICD-10-CM

## 2021-06-25 DIAGNOSIS — F902 Attention-deficit hyperactivity disorder, combined type: Secondary | ICD-10-CM

## 2021-06-25 DIAGNOSIS — M5440 Lumbago with sciatica, unspecified side: Secondary | ICD-10-CM

## 2021-06-25 MED ORDER — PREGABALIN 150 MG PO CAPS: 150.0000 mg | ORAL_CAPSULE | Freq: Two times a day (BID) | ORAL | 5 refills | Status: DC

## 2021-06-25 MED ORDER — HYDROXYZINE HCL 25 MG PO TABS: ORAL_TABLET | ORAL | 2 refills | Status: DC

## 2021-06-25 MED ORDER — AMPHETAMINE-DEXTROAMPHETAMINE 20 MG PO TABS: ORAL_TABLET | ORAL | 0 refills | Status: DC
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
## 2021-07-02 ENCOUNTER — Other Ambulatory Visit: Payer: Self-pay | Admitting: Psychiatry

## 2021-07-02 DIAGNOSIS — F308 Other manic episodes: Secondary | ICD-10-CM

## 2021-07-02 DIAGNOSIS — F5105 Insomnia due to other mental disorder: Secondary | ICD-10-CM

## 2021-07-24 ENCOUNTER — Other Ambulatory Visit: Payer: Self-pay

## 2021-07-24 ENCOUNTER — Telehealth: Payer: Self-pay | Admitting: Psychiatry

## 2021-07-24 NOTE — Telephone Encounter (Signed)
Next visit is 09/04/21. Requesting refill on Adderall and Zolpidem called to:  [id number] [address], [place]  Phone:  [phone]  Fax:  [phone]

## 2021-07-25 MED ORDER — ZOLPIDEM TARTRATE 10 MG PO TABS: ORAL_TABLET | ORAL | 3 refills | Status: DC

## 2021-08-23 ENCOUNTER — Other Ambulatory Visit: Payer: Self-pay | Admitting: Psychiatry

## 2021-08-23 ENCOUNTER — Telehealth: Payer: Self-pay | Admitting: Psychiatry

## 2021-08-23 ENCOUNTER — Other Ambulatory Visit: Payer: Self-pay

## 2021-08-23 DIAGNOSIS — F411 Generalized anxiety disorder: Secondary | ICD-10-CM

## 2021-08-23 DIAGNOSIS — F902 Attention-deficit hyperactivity disorder, combined type: Secondary | ICD-10-CM

## 2021-08-23 MED ORDER — AMPHETAMINE-DEXTROAMPHETAMINE 20 MG PO TABS: ORAL_TABLET | ORAL | 0 refills | Status: DC

## 2021-08-23 NOTE — Telephone Encounter (Signed)
Cancelled and pended  

## 2021-08-23 NOTE — Telephone Encounter (Signed)
Pt called to advise [facility] is out of Adderall. Please sent to [facility], they have in stock.

## 2021-08-28 ENCOUNTER — Telehealth: Payer: Self-pay | Admitting: Psychiatry

## 2021-08-28 NOTE — Telephone Encounter (Signed)
Please review

## 2021-08-28 NOTE — Telephone Encounter (Signed)
Next appointment is 09/04/21. Sasuke stopped by yesterday and said he needed a letter from Dr. [name] stating that he sees him as a patient and he takes several different medications. He needs this for his court date coming up. He will pick the letter up. His phone number is [phone].

## 2021-08-28 NOTE — Telephone Encounter (Signed)
I emailed the letter.  He needs it soon

## 2021-08-29 NOTE — Telephone Encounter (Signed)
LVM

## 2021-08-29 NOTE — Telephone Encounter (Signed)
Letter signed and in office box for him to pick up

## 2021-08-29 NOTE — Telephone Encounter (Signed)
This letter is printed and in CC box to sign. Call pt to pick up

## 2021-08-30 ENCOUNTER — Other Ambulatory Visit: Payer: Self-pay | Admitting: Psychiatry

## 2021-08-30 DIAGNOSIS — F411 Generalized anxiety disorder: Secondary | ICD-10-CM

## 2021-09-04 ENCOUNTER — Other Ambulatory Visit: Payer: Self-pay

## 2021-09-04 ENCOUNTER — Ambulatory Visit (INDEPENDENT_AMBULATORY_CARE_PROVIDER_SITE_OTHER): Payer: Medicare Other | Admitting: Psychiatry

## 2021-09-04 ENCOUNTER — Encounter: Payer: Self-pay | Admitting: Psychiatry

## 2021-09-04 DIAGNOSIS — F5105 Insomnia due to other mental disorder: Secondary | ICD-10-CM

## 2021-09-04 DIAGNOSIS — F308 Other manic episodes: Secondary | ICD-10-CM | POA: Diagnosis not present

## 2021-09-04 DIAGNOSIS — F411 Generalized anxiety disorder: Secondary | ICD-10-CM | POA: Diagnosis not present

## 2021-09-04 DIAGNOSIS — F902 Attention-deficit hyperactivity disorder, combined type: Secondary | ICD-10-CM | POA: Diagnosis not present

## 2021-09-04 MED ORDER — FLUOXETINE HCL 20 MG PO CAPS: 40.0000 mg | ORAL_CAPSULE | Freq: Every day | ORAL | 0 refills | Status: DC

## 2021-09-04 MED ORDER — AMPHETAMINE-DEXTROAMPHETAMINE 20 MG PO TABS: ORAL_TABLET | ORAL | 0 refills | Status: DC

## 2021-09-04 MED ORDER — METOPROLOL SUCCINATE ER 100 MG PO TB24: 100.0000 mg | ORAL_TABLET | Freq: Every day | ORAL | 2 refills | Status: DC
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
## 2021-09-23 ENCOUNTER — Emergency Department (HOSPITAL_COMMUNITY): Payer: Medicare Other

## 2021-09-23 ENCOUNTER — Emergency Department (HOSPITAL_COMMUNITY)
Admission: EM | Admit: 2021-09-23 | Discharge: 2021-09-23 | Disposition: A | Discharge status: Home / Self Care | Payer: Medicare Other | Attending: Emergency Medicine | Admitting: Emergency Medicine

## 2021-09-23 DIAGNOSIS — I10 Essential (primary) hypertension: Secondary | ICD-10-CM | POA: Insufficient documentation

## 2021-09-23 DIAGNOSIS — F1090 Alcohol use, unspecified, uncomplicated: Secondary | ICD-10-CM | POA: Insufficient documentation

## 2021-09-23 DIAGNOSIS — Z79899 Other long term (current) drug therapy: Secondary | ICD-10-CM | POA: Insufficient documentation

## 2021-09-23 DIAGNOSIS — R011 Cardiac murmur, unspecified: Secondary | ICD-10-CM | POA: Insufficient documentation

## 2021-09-23 DIAGNOSIS — R569 Unspecified convulsions: Secondary | ICD-10-CM | POA: Diagnosis present

## 2021-09-23 DIAGNOSIS — W1830XA Fall on same level, unspecified, initial encounter: Secondary | ICD-10-CM | POA: Diagnosis not present

## 2021-09-23 DIAGNOSIS — R42 Dizziness and giddiness: Secondary | ICD-10-CM | POA: Diagnosis not present

## 2021-09-23 LAB — CBC WITH DIFFERENTIAL/PLATELET
Abs Immature Granulocytes: 0.12 10*3/uL — ABNORMAL HIGH (ref 0.00–0.07)
Basophils Absolute: 0.1 10*3/uL (ref 0.0–0.1)
Basophils Relative: 1 %
Eosinophils Absolute: 0.1 10*3/uL (ref 0.0–0.5)
Eosinophils Relative: 1 %
HCT: 38.9 % — ABNORMAL LOW (ref 39.0–52.0)
Hemoglobin: 12.6 g/dL — ABNORMAL LOW (ref 13.0–17.0)
Immature Granulocytes: 1 %
Lymphocytes Relative: 19 %
Lymphs Abs: 2 10*3/uL (ref 0.7–4.0)
MCH: 28.4 pg (ref 26.0–34.0)
MCHC: 32.4 g/dL (ref 30.0–36.0)
MCV: 87.6 fL (ref 80.0–100.0)
Monocytes Absolute: 1.2 10*3/uL — ABNORMAL HIGH (ref 0.1–1.0)
Monocytes Relative: 12 %
Neutro Abs: 6.9 10*3/uL (ref 1.7–7.7)
Neutrophils Relative %: 66 %
Platelets: 286 10*3/uL (ref 150–400)
RBC: 4.44 MIL/uL (ref 4.22–5.81)
RDW: 13.8 % (ref 11.5–15.5)
WBC: 10.4 10*3/uL (ref 4.0–10.5)
nRBC: 0 % (ref 0.0–0.2)

## 2021-09-23 LAB — RAPID URINE DRUG SCREEN, HOSP PERFORMED
Amphetamines: POSITIVE — AB
Barbiturates: NOT DETECTED
Benzodiazepines: NOT DETECTED
Cocaine: NOT DETECTED
Opiates: NOT DETECTED
Tetrahydrocannabinol: NOT DETECTED

## 2021-09-23 LAB — URINALYSIS, ROUTINE W REFLEX MICROSCOPIC
Bilirubin Urine: NEGATIVE
Glucose, UA: NEGATIVE mg/dL
Hgb urine dipstick: NEGATIVE
Ketones, ur: NEGATIVE mg/dL
Leukocytes,Ua: NEGATIVE
Nitrite: NEGATIVE
Protein, ur: NEGATIVE mg/dL
Specific Gravity, Urine: 1.015 (ref 1.005–1.030)
pH: 6 (ref 5.0–8.0)

## 2021-09-23 LAB — COMPREHENSIVE METABOLIC PANEL
ALT: 19 U/L (ref 0–44)
AST: 22 U/L (ref 15–41)
Albumin: 4.1 g/dL (ref 3.5–5.0)
Alkaline Phosphatase: 118 U/L (ref 38–126)
Anion gap: 7 (ref 5–15)
BUN: 11 mg/dL (ref 8–23)
CO2: 28 mmol/L (ref 22–32)
Calcium: 9.4 mg/dL (ref 8.9–10.3)
Chloride: 100 mmol/L (ref 98–111)
Creatinine, Ser: 1.11 mg/dL (ref 0.61–1.24)
GFR, Estimated: >60 mL/min (ref >60)
Glucose, Bld: 109 mg/dL — ABNORMAL HIGH (ref 70–99)
Potassium: 4.6 mmol/L (ref 3.5–5.1)
Sodium: 135 mmol/L (ref 135–145)
Total Bilirubin: 0.7 mg/dL (ref 0.3–1.2)
Total Protein: 7.4 g/dL (ref 6.5–8.1)

## 2021-09-23 LAB — LACTIC ACID, PLASMA: Lactic Acid, Venous: 1.1 mmol/L (ref 0.5–1.9)

## 2021-09-23 LAB — ETHANOL: Alcohol, Ethyl (B): <10 mg/dL (ref <10)

## 2021-09-23 MED ORDER — ACETAMINOPHEN 650 MG RE SUPP: 650.0000 mg | Freq: Once | RECTAL | Status: DC

## 2021-09-23 NOTE — ED Notes (Signed)
Pt verbalized understanding of d/c instructions, meds and followup care. Denies questions. VSS, no distress noted. W/C to exit with all belongings.  

## 2021-09-23 NOTE — ED Provider Notes (Addendum)
[facility] Provider Note   CSN: [id number] Arrival date & time: 09/23/21  1000     History  Chief Complaint  Patient presents with   Seizures    David [name] is a 66 y.o. male PMHx of HTN, depression and anxiety presented following a witnessed seizure. Pt states he woke up this morning he took his morning medications, and may have taken "too many". He endorse possibly taking more Lyrica than prescribed. Around 545AM went to his front porch and had a 1 and half beer and cigarette. He was standing up and suddenly felt dizzy and fell to the ground. His neighbor, Ellard Artis witnessed full body convulsion that lasted about 1 minute. Pt was postictal; en route to ED via ambulance, pt was A&Ox4. Pt reports incontinence, noting that his pants were wet.  Pt endorse heavily alcohol use 2 and half case of beer every 2 days. Daily smoker and does not endorse illicit drug use.   Seizures     Home Medications Prior to Admission medications   Medication Sig Start Date End Date Taking? Authorizing Provider  amphetamine-dextroamphetamine (ADDERALL) 20 MG tablet 1 in the AM and Noon and 1/2 at 4 PM 07/23/21   Cottle, Billey Co., MD  cetaphil (CETAPHIL) lotion Apply 1 application topically as needed for dry skin.     [provider]  FLUoxetine (PROZAC) 20 MG capsule Take 2 capsules (40 mg total) by mouth daily. 09/04/21   Cottle, Billey Co., MD  hydrOXYzine (ATARAX/VISTARIL) 25 MG tablet TAKE 1 TABLET(25 MG) BY MOUTH EVERY 6 HOURS AS NEEDED FOR ITCHING OR ANXIETY 06/25/21   Cottle, Billey Co., MD  levocetirizine (XYZAL) 5 MG tablet Take 5 mg by mouth at bedtime as needed for allergies or itching. 05/01/20   [provider]  metoprolol succinate (TOPROL-XL) 100 MG 24 hr tablet Take 1 tablet (100 mg total) by mouth daily. 09/04/21   Cottle, Billey Co., MD  pregabalin (LYRICA) 150 MG capsule Take 1 capsule (150 mg total) by mouth 2 (two) times daily.  06/25/21   Cottle, Billey Co., MD  QUEtiapine (SEROQUEL) 100 MG tablet TAKE 2 TABLETS AT BEDTIME 08/23/21   Cottle, Billey Co., MD  QUEtiapine (SEROQUEL) 200 MG tablet Take 3 tablets (600 mg total) by mouth at bedtime. 07/02/21   Cottle, Billey Co., MD  RINVOQ 15 MG TB24 Take 15 mg by mouth daily. 09/16/21   [provider]  Risankizumab-rzaa (SKYRIZI, 150 MG DOSE, [place]) Inject into the skin.    [provider]  zolpidem (AMBIEN) 10 MG tablet 1.5 tablets at night as needed for sleep. 07/25/21   Cottle, Billey Co., MD      Allergies    Codeine    Review of Systems   Review of Systems  Constitutional:  Negative for chills and fever.  Eyes:  Negative for visual disturbance.  Respiratory:  Negative for shortness of breath.   Cardiovascular:  Negative for chest pain.  Gastrointestinal:  Negative for diarrhea and vomiting.  Neurological:  Positive for seizures. Negative for dizziness and headaches.   Physical Exam Updated Vital Signs BP (!) 168/100    Pulse (!) 124    Temp 97.6 F (36.4 C) (Oral)    Resp 18    SpO2 90%  Physical Exam Constitutional:      General: He is not in acute distress. HENT:     Head: Normocephalic and atraumatic.  Cardiovascular:  Rate and Rhythm: Normal rate and regular rhythm.     Heart sounds: Murmur heard.  Systolic murmur is present.  Pulmonary:     Effort: Pulmonary effort is normal.     Breath sounds: Normal breath sounds and air entry.  Abdominal:     Palpations: Abdomen is soft.  Musculoskeletal:     Right lower leg: No edema.     Left lower leg: No edema.  Skin:    General: Skin is warm and dry.  Neurological:     General: No focal deficit present.     Mental Status: He is alert.  Psychiatric:        Attention and Perception: Attention normal.        Behavior: Behavior is cooperative.    ED Results / Procedures / Treatments   Labs (all labs ordered are listed, but only abnormal results are displayed) Labs Reviewed   COMPREHENSIVE METABOLIC PANEL - Abnormal; Notable for the following components:      Result Value   Glucose, Bld 109 (*)    All other components within normal limits  CBC WITH DIFFERENTIAL/PLATELET - Abnormal; Notable for the following components:   Hemoglobin 12.6 (*)    HCT 38.9 (*)    Monocytes Absolute 1.2 (*)    Abs Immature Granulocytes 0.12 (*)    All other components within normal limits  LACTIC ACID, PLASMA  ETHANOL  LACTIC ACID, PLASMA  URINALYSIS, ROUTINE W REFLEX MICROSCOPIC  RAPID URINE DRUG SCREEN, HOSP PERFORMED  CBG MONITORING, ED    EKG EKG Interpretation  Date/Time:  Monday September 23 2021 10:05:40 EST Ventricular Rate:  103 PR Interval:  168 QRS Duration: 99 QT Interval:  353 QTC Calculation: 463 R Axis:   70 Text Interpretation: Sinus tachycardia Abnormal inferior Q waves Since last tracing rate faster Confirmed by Isla Pence [phone]) on 09/23/2021 10:31:05 AM  Radiology CT HEAD WO CONTRAST (5MM)  Result Date: 09/23/2021 CLINICAL DATA:  New onset witnessed seizure EXAM: CT HEAD WITHOUT CONTRAST TECHNIQUE: Contiguous axial images were obtained from the base of the skull through the vertex without intravenous contrast. COMPARISON:  05/24/2020 FINDINGS: Brain: No evidence of acute infarction, hemorrhage, hydrocephalus, extra-axial collection or mass lesion/mass effect. Scattered low-density changes within the periventricular and subcortical white matter compatible with chronic microvascular ischemic change. Mild diffuse cerebral volume loss. Vascular: Atherosclerotic calcifications involving the large vessels of the skull base. No unexpected hyperdense vessel. Skull: Normal. Negative for fracture or focal lesion. Sinuses/Orbits: No acute finding. Other: None. IMPRESSION: 1. No acute intracranial findings. 2. Mild chronic microvascular ischemic change and cerebral volume loss. Electronically Signed   By: Davina Poke D.O.   On: 09/23/2021 11:32     Procedures Procedures    Medications Ordered in ED Medications - No data to display   ED Course/ Medical Decision Making/ A&P                           Medical Decision Making  Seizure Pt sustained witnessed seizure lasting 1 minute; no tongue biting however, incontinence noted. No prior hx of seizures. Pt endorse possibly taking "too much" of his home medications, specifically Lyrica. Upon chart review, other home medications include: Seroquel, Ambien, Prozac and Adderall. Adverse effects of these medications listed does not include seizure, though not impossible. Pt has hx of heavy alcohol use, however, alcohol withdrawal less likely given pt last drink was prior to admission. Pt denies illicit drug use, though  UDS pending. Unfortunately, Pt was unable to provide urine sample. EKG significant for sinus tachycardia and Q waves. Although, pt denies significant cardiac hx, possible underlying condition. On PE, systolic murmur noted. Though, cardiac monitoring reveal no abnormalities. CT head negative for any intracranial abnormalities. CBC insignificant; CMP was reassuring, no electrolyte imbalance noted, glucose levels acceptable. Cr levels and LFTs were normal. Etoh levels <10. Pt is at baseline and stable for discharge. Pt will need to follow up with Neurology outpatient.        Final Clinical Impression(s) / ED Diagnoses Final diagnoses:  None    Rx / DC Orders ED Discharge Orders     None         Timothy Lasso, MD 09/23/21 1529    Timothy Lasso, MD 09/23/21 1531    Isla Pence, MD 09/23/21 [phone]

## 2021-09-23 NOTE — Discharge Instructions (Addendum)
Please follow up with a Neurologist.   Please refrain from alcohol use.

## 2021-09-23 NOTE — ED Triage Notes (Signed)
Pt to ED via EMS from home d/t witnessed seizure lasting approx 1 minute. Postictal initially with EMS, improved to a&o x 4 PTA. Pt reported feeling "off" this AM to wife. No hx of seizures.  EMS v/s: 138/80 114 HR 92% room air, increased to 98% on 2L 126 CBG 18 LAC

## 2021-09-27 ENCOUNTER — Other Ambulatory Visit: Payer: Self-pay

## 2021-09-27 ENCOUNTER — Telehealth: Payer: Self-pay | Admitting: Psychiatry

## 2021-09-27 DIAGNOSIS — F902 Attention-deficit hyperactivity disorder, combined type: Secondary | ICD-10-CM

## 2021-09-27 MED ORDER — ZOLPIDEM TARTRATE 10 MG PO TABS: ORAL_TABLET | ORAL | 3 refills | Status: DC

## 2021-09-27 MED ORDER — AMPHETAMINE-DEXTROAMPHETAMINE 20 MG PO TABS: ORAL_TABLET | ORAL | 0 refills | Status: DC

## 2021-09-27 NOTE — Telephone Encounter (Signed)
Thanks

## 2021-09-27 NOTE — Telephone Encounter (Signed)
Patient called in for refill on Adderall and Ambien. Ph: [phone]. Appt 2/21 Pharmacy [facility]

## 2021-10-03 ENCOUNTER — Telehealth: Payer: Self-pay | Admitting: Neurology

## 2021-10-03 ENCOUNTER — Encounter: Payer: Self-pay | Admitting: Neurology

## 2021-10-03 ENCOUNTER — Ambulatory Visit (INDEPENDENT_AMBULATORY_CARE_PROVIDER_SITE_OTHER): Payer: Medicare Other | Admitting: Neurology

## 2021-10-03 VITALS — BP 164/100 | HR 70 | Ht 68.0 in | Wt 185.5 lb

## 2021-10-03 DIAGNOSIS — F32A Depression, unspecified: Secondary | ICD-10-CM | POA: Diagnosis not present

## 2021-10-03 DIAGNOSIS — G40909 Epilepsy, unspecified, not intractable, without status epilepticus: Secondary | ICD-10-CM

## 2021-10-03 DIAGNOSIS — Z79899 Other long term (current) drug therapy: Secondary | ICD-10-CM

## 2021-10-03 MED ORDER — LAMOTRIGINE 25 MG PO TABS: ORAL_TABLET | ORAL | 0 refills | Status: DC

## 2021-10-03 MED ORDER — LAMOTRIGINE 100 MG PO TABS: 100.0000 mg | ORAL_TABLET | Freq: Two times a day (BID) | ORAL | 11 refills | Status: DC

## 2021-10-03 NOTE — Telephone Encounter (Signed)
[employer] medicare/medicaid order sent to [facility], [facility] they will reach out to the patient to schedule.

## 2021-10-03 NOTE — Progress Notes (Signed)
Chief Complaint  Patient presents with   Follow-up    Rm 14. PCP is David [name]. Accompanied by wife. NP internal referral for Seizure. Pt has a right arm injury from a fall sustained during a seizure. Pt reports seizure on 09/23/21 and 10/01/21.       ASSESSMENT AND PLAN  David [name] is a 66 y.o. male   Seizure  The description of September 23, 2021 episode most consistent with generalized seizure, patient has been drinking beers 5-6 every day, there was no reported alcohol withdrawal of the day of seizure, recurrent episode on October 01, 2021 could be a possible syncope,  Discussed with patient, with the suboptimal control of depression, anxiety, recurrent passing out spells, agree on empirically treat lamotrigine titrating to 100 mg twice a day  Emphasized importance of tapering of alcohol use, taper of excessive caffeine stimulation,  With his aging, vascular risk factor of hypertension, tendency for noncompliance with medications, he is at increased risk for cardiovascular event, I do not think stimulant treatment with Adderall, especially 50 mg daily dose is safe for him, will convey the information to his psychiatrist David [name]  MRI of the brain with without contrast  EEG   DIAGNOSTIC DATA (LABS, IMAGING, TESTING) - I reviewed patient records, labs, notes, testing and imaging myself where available.  UDS was positive for amphetamines, Alcohol< 10, CBC, Hg 12.6, CMP, creat 1.11   MEDICAL HISTORY:  David [name], this 66 year old male, seen in request by Dr. Isla [name], for evaluation of seizure, his primary care physician is Dr., David [name], David [name], initial evaluation was with his wife on October 03, 2021.  I reviewed and summarized the referring note. PMHX. ADD on adderall 20mg  1/1/0.5=50mg , David [name] Depression, on prozac 20mg  qhs, seroquel 200mg  -300mg  qhs. Chronic insomnia Psoriasis PTSD Lumbar decompression Left hip surgery Right femur  surgery.  Patient retired as a Art gallery manager, does drink 5-6 beers every day, regular Coke 12 cans each day  First seizure was witnessed by his wife on September 23, 2021, there was sitting on sofa, he suddenly fell to the ground, whole body convulsion, loud snoring sound, foaming, out of his mouth, breathing heavily, eyes was wide open, holding breath,  Paramedic was called, he was taken to emergency room Personally reviewed CT head without contrast, there was no acute abnormality, mild generalized atrophy small vessel disease  MRI of the brain September 21, there was no acute abnormality, patient was evaluated for sudden onset confusion, shortness of breath, with alcohol level less than 10,  UDS was positive for amphetamine and marijuana, EEG showed moderate slowing, blood pressure was elevated upon presentation 220/110, was diagnosed with hypertensive encephalopathy, likely component of alcohol withdrawal  He does have a history of ADHD, has been on Adderall up to 50 mg daily this was prescribed by his psychiatrist David [name]  October 01, 2021, his stand up after watching TV for a long time, felt lightheaded, tried to brace himself, fell to the floor, no loss of consciousness, did report heart racing fast, vision going up went out, muffled sound, when wife came home, the side table was pushed away, Coke spilled on the floor, he was lying on the sofa confused,  Mother suffered Alzheimer's disease, he was noted to have gradual worsening memory loss over the past few years, MoCA examination 20/30  PHYSICAL EXAM:   Vitals:   10/03/21 1113  BP: (!) 164/100  Pulse: 70  Weight: 185 lb 8 oz (84.1  kg)  Height: 5\' 8"  (1.727 m)   Not recorded     Body mass index is 28.21 kg/m.  PHYSICAL EXAMNIATION:  Gen: NAD, conversant, well nourised, well groomed                     Cardiovascular: Regular rate rhythm, no peripheral edema, warm, nontender. Eyes: Conjunctivae clear without exudates or  hemorrhage Neck: Supple, no carotid bruits. Pulmonary: Clear to auscultation bilaterally   NEUROLOGICAL EXAM:  MENTAL STATUS: Speech:    Speech is normal; fluent and spontaneous with normal comprehension.  Cognition:    Montreal Cognitive Assessment  10/03/2021  Visuospatial/ Executive (0/5) 3  Naming (0/3) 3  Attention: Read list of digits (0/2) 2  Attention: Read list of letters (0/1) 0  Attention: Serial 7 subtraction starting at 100 (0/3) 1  Language: Repeat phrase (0/2) 2  Language : Fluency (0/1) 0  Abstraction (0/2) 2  Delayed Recall (0/5) 1  Orientation (0/6) 6  Total 20    CRANIAL NERVES: CN II: Visual fields are full to confrontation. Pupils are round equal and briskly reactive to light. CN III, IV, VI: extraocular movement are normal. No ptosis. CN V: Facial sensation is intact to light touch CN VII: Face is symmetric with normal eye closure  CN VIII: Hearing is normal to causal conversation. CN IX, X: Phonation is normal. CN XI: Head turning and shoulder shrug are intact  MOTOR: There is no pronator drift of out-stretched arms. Muscle bulk and tone are normal. Muscle strength is normal.  REFLEXES: Reflexes are 2+ and symmetric at the biceps, triceps, knees, and ankles. Plantar responses are flexor.  SENSORY: Intact to light touch, pinprick and vibratory sensation are intact in fingers and toes.  COORDINATION: There is no trunk or limb dysmetria noted.  GAIT/STANCE: Can get up from seated position arm crossed, steady  REVIEW OF SYSTEMS:  Full 14 system review of systems performed and notable only for as above All other review of systems were negative.   ALLERGIES: Allergies  Allergen Reactions   Codeine Nausea Only    HOME MEDICATIONS: Current Outpatient Medications  Medication Sig Dispense Refill   amphetamine-dextroamphetamine (ADDERALL) 20 MG tablet 1 in the AM and Noon and 1/2 at 4 PM 75 tablet 0   cetaphil (CETAPHIL) lotion Apply 1  application topically as needed for dry skin.      FLUoxetine (PROZAC) 20 MG capsule Take 2 capsules (40 mg total) by mouth daily. (Patient taking differently: Take 20 mg by mouth See admin instructions. Take one capsule (20 mg) by mouth twice daily - with lunch and supper) 180 capsule 0   hydrOXYzine (ATARAX/VISTARIL) 25 MG tablet TAKE 1 TABLET(25 MG) BY MOUTH EVERY 6 HOURS AS NEEDED FOR ITCHING OR ANXIETY (Patient taking differently: Take 25 mg by mouth every 6 (six) hours as needed for itching or anxiety.) 120 tablet 2   metoprolol succinate (TOPROL-XL) 100 MG 24 hr tablet Take 1 tablet (100 mg total) by mouth daily. (Patient taking differently: Take 100 mg by mouth daily with supper.) 30 tablet 2   pregabalin (LYRICA) 150 MG capsule Take 1 capsule (150 mg total) by mouth 2 (two) times daily. 60 capsule 5   QUEtiapine (SEROQUEL) 100 MG tablet TAKE 2 TABLETS AT BEDTIME (Patient taking differently: Take 100 mg by mouth at bedtime. Take with a 200 mg tablet for a total dose of 300 mg) 60 tablet 0   QUEtiapine (SEROQUEL) 200 MG tablet Take 3 tablets (  600 mg total) by mouth at bedtime. (Patient taking differently: Take 600 mg by mouth at bedtime. Take with a 100 mg tablet for a total dose of 300 mg) 270 tablet 0   Upadacitinib ER (RINVOQ) 15 MG TB24 Take 15 mg by mouth daily.     zolpidem (AMBIEN) 10 MG tablet 1.5 tablets at night as needed for sleep. 45 tablet 3   No current facility-administered medications for this visit.    PAST MEDICAL HISTORY: Past Medical History:  Diagnosis Date   ADD (attention deficit disorder)    Anemia    Microcytic   Arthritis    Bipolar disorder ([place])    Depression    Dyspnea    history of no current issues 05/26/2019   GAD (generalized anxiety disorder)    History of kidney stones    passed   HTN (hypertension)    PTSD (post-traumatic stress disorder)    Skin cancer     PAST SURGICAL HISTORY: Past Surgical History:  Procedure Laterality Date   BACK  SURGERY     Fusion - lumbar   HIP PINNING,CANNULATED Right 06/24/2018   Procedure: RIGHT CANNULATED HIP PINNING;  Surgeon: Erle Crocker, MD;  Location: [facility];  Service: Orthopedics;  Laterality: Right;   LITHOTRIPSY     MULTIPLE EXTRACTIONS WITH ALVEOLOPLASTY Bilateral 05/03/2019   Procedure: MULTIPLE EXTRACTION TEETH NUMBER TWO, THREE, FIVE, SIX, SEVEN, EIGHT, NINE, TEN, ELEVEN, FIFTEEN, SIXTEEN, SEVENTEEN, NINETEEN, TWENTY, TWENTY-ONE, TWENTY-TWO, TWENTY-THREE, TWENTY-FOUR, TWENTY-FIVE, TWENTY-SIX, TWENTY-SEVEN, TWENTY-EIGHT, TWENTY-NINE, THIRTY-TWO WITH ALVEOLOPLASTY;  Surgeon: Diona Browner, DDS;  Location: [facility] [place];  Service: Oral Surgery;  Laterality: Bilateral;   NOSE SURGERY     x3   TOTAL HIP ARTHROPLASTY Left 05/27/2019   Procedure: TOTAL HIP ARTHROPLASTY ANTERIOR APPROACH;  Surgeon: Dorna Leitz, MD;  Location: [facility];  Service: Orthopedics;  Laterality: Left;    FAMILY HISTORY: Family History  Problem Relation Age of Onset   Arthritis Other    Heart disease Other    Cancer Other    Hypertension Other     SOCIAL HISTORY: Social History   Socioeconomic History   Marital status: Married    Spouse name: Not on file   Number of children: Not on file   Years of education: Not on file   Highest education level: Not on file  Occupational History   Not on file  Tobacco Use   Smoking status: Every Day    Packs/day: 1.00    Years: 40.00    Pack years: 40.00    Types: Cigarettes    Last attempt to quit: 04/25/2019    Years since quitting: 2.4   Smokeless tobacco: Never  Vaping Use   Vaping Use: Never used  Substance and Sexual Activity   Alcohol use: Yes    Comment: daily-6-7/day   Drug use: Not Currently    Types: Marijuana   Sexual activity: Not on file  Other Topics Concern   Not on file  Social History Narrative   Not on file   Social Determinants of Health   Financial Resource Strain: Not on file  Food Insecurity: Not on file  Transportation Needs:  Not on file  Physical Activity: Not on file  Stress: Not on file  Social Connections: Not on file  Intimate Partner Violence: Not on file      Marcial Pacas, M.D. Ph.D.  [facility] [address] [place], [place] [address] Ph: [phone] Fax: [phone]  CC:  David Pence, MD  [place],  [place] [id number]  [name], MD

## 2021-10-07 ENCOUNTER — Other Ambulatory Visit: Payer: Medicare Other | Admitting: *Deleted

## 2021-10-16 ENCOUNTER — Ambulatory Visit: Payer: Medicaid Other | Admitting: Neurology

## 2021-10-21 ENCOUNTER — Ambulatory Visit (INDEPENDENT_AMBULATORY_CARE_PROVIDER_SITE_OTHER): Payer: Medicare Other | Admitting: Neurology

## 2021-10-21 ENCOUNTER — Other Ambulatory Visit: Payer: Self-pay

## 2021-10-21 DIAGNOSIS — F32A Depression, unspecified: Secondary | ICD-10-CM

## 2021-10-21 DIAGNOSIS — Z79899 Other long term (current) drug therapy: Secondary | ICD-10-CM

## 2021-10-21 DIAGNOSIS — G40909 Epilepsy, unspecified, not intractable, without status epilepticus: Secondary | ICD-10-CM

## 2021-10-28 ENCOUNTER — Other Ambulatory Visit: Payer: Self-pay | Admitting: Psychiatry

## 2021-10-28 DIAGNOSIS — F411 Generalized anxiety disorder: Secondary | ICD-10-CM

## 2021-10-29 ENCOUNTER — Other Ambulatory Visit: Payer: Self-pay | Admitting: Psychiatry

## 2021-10-29 NOTE — Progress Notes (Signed)
Pt had SZ and saw neuro who recommended change from Adderall 50 mg daily

## 2021-10-29 NOTE — Progress Notes (Signed)
Thank you for your evaluation and note on this pt.  Will make changes including weaning the Adderall

## 2021-11-05 ENCOUNTER — Other Ambulatory Visit: Payer: Self-pay

## 2021-11-05 ENCOUNTER — Encounter: Payer: Self-pay | Admitting: Psychiatry

## 2021-11-05 ENCOUNTER — Ambulatory Visit (INDEPENDENT_AMBULATORY_CARE_PROVIDER_SITE_OTHER): Payer: Medicare Other | Admitting: Psychiatry

## 2021-11-05 DIAGNOSIS — F411 Generalized anxiety disorder: Secondary | ICD-10-CM

## 2021-11-05 DIAGNOSIS — I10 Essential (primary) hypertension: Secondary | ICD-10-CM

## 2021-11-05 DIAGNOSIS — F902 Attention-deficit hyperactivity disorder, combined type: Secondary | ICD-10-CM

## 2021-11-05 DIAGNOSIS — F5105 Insomnia due to other mental disorder: Secondary | ICD-10-CM

## 2021-11-05 DIAGNOSIS — G8929 Other chronic pain: Secondary | ICD-10-CM

## 2021-11-05 DIAGNOSIS — F308 Other manic episodes: Secondary | ICD-10-CM

## 2021-11-05 DIAGNOSIS — M5440 Lumbago with sciatica, unspecified side: Secondary | ICD-10-CM

## 2021-11-05 DIAGNOSIS — F1011 Alcohol abuse, in remission: Secondary | ICD-10-CM
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
## 2021-11-07 NOTE — Procedures (Signed)
° °  HISTORY: 66 year old male, with history of seizure  TECHNIQUE:  This is a routine 16 channel EEG recording with one channel devoted to a limited EKG recording.  It was performed during wakefulness, drowsiness and asleep.  Hyperventilation and photic stimulation were performed as activating procedures.  There are minimum muscle and movement artifact noted.  Upon maximum arousal, posterior dominant waking rhythm consistent of mildly dysrhythmic theta range activity, with frequency of 6-7 hz. Activities are symmetric over the bilateral posterior derivations and attenuated with eye opening.  Hyperventilation produced mild/moderate buildup with higher amplitude and the slower activities noted.  Photic stimulation did not alter the tracing.  During EEG recording, patient developed drowsiness and no deeper stage of sleep was achieved  During EEG recording, there was no epileptiform discharge noted.  EKG demonstrate sinus rhythm, with heart rate of 76 bpm  CONCLUSION: This is a mild abnormal awake EEG.  There is evidence of mild background slowing, consistent with mild bihemispheric malfunction, common etiology is metabolic toxic.  Marcial Pacas, M.D. Ph.D.  [facility] [place], [place] [address] Phone: [phone] Fax:      [phone]

## 2021-11-11 ENCOUNTER — Ambulatory Visit
Admission: RE | Admit: 2021-11-11 | Discharge: 2021-11-11 | Disposition: A | Discharge status: Home / Self Care | Payer: Medicare Other | Source: Ambulatory Visit | Attending: Neurology | Admitting: Neurology

## 2021-11-11 DIAGNOSIS — F32A Depression, unspecified: Secondary | ICD-10-CM

## 2021-11-11 DIAGNOSIS — Z79899 Other long term (current) drug therapy: Secondary | ICD-10-CM

## 2021-11-11 DIAGNOSIS — G40909 Epilepsy, unspecified, not intractable, without status epilepticus: Secondary | ICD-10-CM | POA: Diagnosis not present

## 2021-11-11 MED ORDER — GADOBENATE DIMEGLUMINE 529 MG/ML IV SOLN
15.0000 mL | Freq: Once | INTRAVENOUS | Status: AC | PRN
  Administered 2021-11-11: 15 mL via INTRAVENOUS

## 2021-11-21 ENCOUNTER — Other Ambulatory Visit: Payer: Self-pay | Admitting: Psychiatry

## 2021-11-21 DIAGNOSIS — F5105 Insomnia due to other mental disorder: Secondary | ICD-10-CM

## 2021-11-21 DIAGNOSIS — F308 Other manic episodes: Secondary | ICD-10-CM

## 2021-11-25 ENCOUNTER — Other Ambulatory Visit: Payer: Self-pay

## 2021-11-25 DIAGNOSIS — F411 Generalized anxiety disorder: Secondary | ICD-10-CM

## 2021-11-25 MED ORDER — HYDROXYZINE HCL 25 MG PO TABS: ORAL_TABLET | ORAL | 0 refills | Status: DC

## 2021-11-25 MED ORDER — QUETIAPINE FUMARATE 100 MG PO TABS: ORAL_TABLET | ORAL | 0 refills | Status: DC

## 2021-12-01 ENCOUNTER — Other Ambulatory Visit: Payer: Self-pay | Admitting: Psychiatry

## 2021-12-21 ENCOUNTER — Other Ambulatory Visit: Payer: Self-pay | Admitting: Psychiatry

## 2021-12-21 DIAGNOSIS — F411 Generalized anxiety disorder: Secondary | ICD-10-CM

## 2021-12-31 ENCOUNTER — Other Ambulatory Visit: Payer: Self-pay | Admitting: Psychiatry

## 2021-12-31 DIAGNOSIS — F411 Generalized anxiety disorder: Secondary | ICD-10-CM

## 2022-01-06 ENCOUNTER — Ambulatory Visit: Payer: Medicare Other | Admitting: Adult Health

## 2022-01-13 ENCOUNTER — Ambulatory Visit (INDEPENDENT_AMBULATORY_CARE_PROVIDER_SITE_OTHER): Payer: Medicare Other | Admitting: Psychiatry

## 2022-01-13 ENCOUNTER — Encounter: Payer: Self-pay | Admitting: Psychiatry

## 2022-01-13 DIAGNOSIS — F902 Attention-deficit hyperactivity disorder, combined type: Secondary | ICD-10-CM

## 2022-01-13 DIAGNOSIS — F5105 Insomnia due to other mental disorder: Secondary | ICD-10-CM | POA: Diagnosis not present

## 2022-01-13 DIAGNOSIS — F411 Generalized anxiety disorder: Secondary | ICD-10-CM

## 2022-01-13 DIAGNOSIS — F308 Other manic episodes: Secondary | ICD-10-CM

## 2022-01-13 DIAGNOSIS — G8929 Other chronic pain: Secondary | ICD-10-CM

## 2022-01-13 DIAGNOSIS — M5440 Lumbago with sciatica, unspecified side: Secondary | ICD-10-CM

## 2022-01-13 MED ORDER — FLUOXETINE HCL 20 MG PO CAPS: 40.0000 mg | ORAL_CAPSULE | Freq: Every day | ORAL | 0 refills | Status: DC

## 2022-01-13 MED ORDER — PREGABALIN 150 MG PO CAPS: 150.0000 mg | ORAL_CAPSULE | Freq: Two times a day (BID) | ORAL | 5 refills | Status: DC

## 2022-01-13 MED ORDER — HYDROXYZINE HCL 25 MG PO TABS: ORAL_TABLET | ORAL | 5 refills | Status: DC

## 2022-01-13 MED ORDER — ZOLPIDEM TARTRATE 10 MG PO TABS
ORAL_TABLET | ORAL | 3 refills | Status: DC
Start: 2022-01-13 — End: 2022-04-24
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
## 2022-01-23 ENCOUNTER — Telehealth: Payer: Self-pay | Admitting: Psychiatry

## 2022-01-23 NOTE — Telephone Encounter (Signed)
David [name] called today at 11:26am to request early refills on all his medications.  And to send in refill for his Seroquel.  He is going to [place] for 3-4 weeks and wants to be able to take his medications with him so he needs to get his refills early.  Please approve.  [facility] on [address]. ?

## 2022-01-24 ENCOUNTER — Other Ambulatory Visit: Payer: Self-pay

## 2022-01-24 DIAGNOSIS — F5105 Insomnia due to other mental disorder: Secondary | ICD-10-CM

## 2022-01-24 DIAGNOSIS — F308 Other manic episodes: Secondary | ICD-10-CM

## 2022-01-24 NOTE — Telephone Encounter (Signed)
Talked with wife to ask if she knew date patient was leaving. She said she would call patient and ask him and call right back, but have not heard yet.  ?

## 2022-01-24 NOTE — Telephone Encounter (Signed)
Will contact pt when he is leaving and how early refills are. He may have to pay out of pocket depending on timing  ?

## 2022-01-24 NOTE — Telephone Encounter (Signed)
Ray will be leaving 02/06/22.  The prescriptions will be about 1 week early. ?

## 2022-01-28 MED ORDER — QUETIAPINE FUMARATE 200 MG PO TABS: 600.0000 mg | ORAL_TABLET | Freq: Every day | ORAL | 0 refills | Status: DC

## 2022-01-28 NOTE — Telephone Encounter (Signed)
Thank you and agreed.  Please pend refills to be sent next week ?

## 2022-01-28 NOTE — Telephone Encounter (Signed)
Pt leaving on 02/06/22 for [place] for 3-4 weeks  ? ?Last refill Ambien 5/11 #45 ?Last refill Lyrica 5/5 #60 ? ? ?Will okay early refills but not until the week he is leaving. Will contact pharmacy at that time for controlled medications.  ?

## 2022-01-30 ENCOUNTER — Other Ambulatory Visit: Payer: Self-pay

## 2022-01-30 DIAGNOSIS — F5105 Insomnia due to other mental disorder: Secondary | ICD-10-CM

## 2022-01-30 DIAGNOSIS — F308 Other manic episodes: Secondary | ICD-10-CM

## 2022-01-30 MED ORDER — QUETIAPINE FUMARATE 200 MG PO TABS: 600.0000 mg | ORAL_TABLET | Freq: Every day | ORAL | 0 refills | Status: DC

## 2022-01-30 NOTE — Telephone Encounter (Signed)
Noted I will resend. thanks

## 2022-01-30 NOTE — Telephone Encounter (Signed)
Patient called back into office regarding previous messages. States that prescription for Seroquel was sent to the wrong pharmacy and he instead would like it sent to [facility]: [phone] Appt 6/21

## 2022-02-17 ENCOUNTER — Other Ambulatory Visit: Payer: Self-pay | Admitting: Psychiatry

## 2022-02-17 DIAGNOSIS — F308 Other manic episodes: Secondary | ICD-10-CM

## 2022-02-17 DIAGNOSIS — F5105 Insomnia due to other mental disorder: Secondary | ICD-10-CM

## 2022-03-01 ENCOUNTER — Other Ambulatory Visit: Payer: Self-pay | Admitting: Psychiatry

## 2022-03-01 DIAGNOSIS — F411 Generalized anxiety disorder: Secondary | ICD-10-CM

## 2022-03-05 ENCOUNTER — Encounter: Payer: Self-pay | Admitting: Psychiatry

## 2022-03-05 ENCOUNTER — Ambulatory Visit (INDEPENDENT_AMBULATORY_CARE_PROVIDER_SITE_OTHER): Payer: Medicare Other | Admitting: Psychiatry

## 2022-03-05 DIAGNOSIS — F902 Attention-deficit hyperactivity disorder, combined type: Secondary | ICD-10-CM

## 2022-03-05 DIAGNOSIS — F411 Generalized anxiety disorder: Secondary | ICD-10-CM | POA: Diagnosis not present

## 2022-03-05 DIAGNOSIS — F5105 Insomnia due to other mental disorder: Secondary | ICD-10-CM

## 2022-03-05 DIAGNOSIS — F308 Other manic episodes: Secondary | ICD-10-CM | POA: Diagnosis not present

## 2022-03-05 DIAGNOSIS — M5440 Lumbago with sciatica, unspecified side: Secondary | ICD-10-CM

## 2022-03-05 DIAGNOSIS — G8929 Other chronic pain: Secondary | ICD-10-CM

## 2022-03-05 MED ORDER — MODAFINIL 200 MG PO TABS: ORAL_TABLET | ORAL | 1 refills | Status: DC

## 2022-03-05 NOTE — Progress Notes (Signed)
David [name] [id number] 07/07/1956 66 y.o.  Subjective:   Patient ID:  David [name] is a 66 y.o. (DOB December 16, 1955) male.  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 Pertinent negatives include no palpitations.  Anxiety Symptoms include decreased concentration and nervous/anxious behavior. Patient reports no confusion, palpitations or suicidal ideas.    Depression        Associated symptoms include decreased concentration.  Associated symptoms include no suicidal ideas.  Past medical history includes anxiety.    David [name] presents to the office today for follow-up of anxiety depression and ADD.  seen October 25, 2019.  No meds were changed  Had accident and fractured leg Jun 23, 2018 and had to have surgery with complications and incomplete recovery. .Pain is better and can walk without assistance now.  But can't eat bc all teeth pulled and had complications from that.  Still dealing with that.  Dentures not right. Has had a couple of infections that were in the skin and one cancer of skin.  Health has been a real drag on his mood over the last 18 mos.     Not as much things to enjoy as he'd like.  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    As of appointment January 03, 2020 he reports the following: Moved appt up.  Nephew in [place] and David [name] is all he has.  Moving to [place]. Just finished prednisone.Was wired on it.  Wife noted it also.  Couldn't sleep and amped up.   Havent' seen mother in a while DT Covid.  M is amazingly still alive.  [name] has been supportive.   Has panic every 6-8 weeks and will have to leave the building.  Wife also has panic and drinking to deal with thte panic.   Haven't smoked in 3 mos and pleased with that but it's still hard.   No drugs.  Not hard.  Less drinking significantly.  He has  cut back because wife's drinking is bothering him.  Says he doing well with sobriety but still drinks regularly.  Denies getting drunk.  Disc risk of falling.  [name] died in motorcycle MVA Apr 29, 2019.  Very upset.  Was planning to go to [place] with him.  Plan no med changes  06/07/20 TC: Patient had hypertensive crisis with altered mental status on 05/24/2020 and was admitted to the hospital.  Was instructed to discontinue Adderall until blood pressure was better controlled. Seen by PCP on 06/04/2020 with blood pressure 160/90 and started on olmesartan.  Clonidine was discontinued in the hospital because it was felt he had may have been noncompliant with the clonidine causing the hypertensive reaction. Patient needs to come to the office to have his blood pressure checked and it needs to be approximately 140/90 or better in order to resume the normal dose of Adderall.  Will not send in Adderall RX today   06/15/20 TC: Ray came in today for a BP check which was requested by Dr. [name] in order to refill his Adderall. Did first BP reading 161/92 with pulse 104. Stopped and waited two minutes and did second BP-  136/87 with pulse 99. If appropriate I told him we would call him to let him know if we can fill his Adderall. Sent  in Adderall at lower dose   06/25/20 appointment with the following noted:  BP med changed to olmesartan and stopped clonidine. [name]. Some problems with depression and anxiety lately with stressors.  Itching drives him crazy from psoriasis. Don't sleep worth a damn. Ambien only works for a couple of hours. Again discussed recent hospitalization for hypertensive encephalopathy.  He strongly denies using any cocaine or abusing the Adderall.  He admits to some ongoing intermittent marijuana use but no other illicit drugs.  He does not recall any noncompliance with clonidine. He was not discharged on any hypertensive meds and has started olmesartan from his  primary care doc but it does not appear to be fully controlling his blood pressure.  He was encouraged to continue to talk to his primary care doctor about further med adjustments. He has restarted a lower dose of Adderall 20 mg 3 times daily instead of 30 mg 3 times daily.  His pulse is borderline high as noted.  He does not have any symptoms related to either hypertension or tachycardia.  Plan: Start quetiapine 50 to 100 mg nightly.  08/08/2020 appointment with following noted: Psoriasis is driving me crazy.  Will show up at derm over it trying to get help.  Dupixent didn't help psoriasis after 4 mos. Quetiapine is helping some but psoriasis is interfering with his whole life. Thinks he ran out of quetiapine.  10/04/2020 appt noted: December 13 pulled over.  Lost license and debit card.  Was told license revoked for failure to appear at court in April.  Had seen an attorney over it and the charges were dismissed and he had been told this.  But clerical problems kept him from getting license. Off and on urinary urgency.  Ongoing chronic stress.  Affect sleep and anxiety levels mainly.  Not profoundly depressed.  Denies significant manic symptoms.  Denies alcohol abuse or heart drug use.  Some marijuana use Plan: Increase to quetiapine 200 mg nightly.   01/21/2021 appointment with the following noted: Still dealing with teeth issues and hasn't been able to get false teeth back.  Top medical concern. 08/27/20 legal issue is still unresolved also.  Blew 0.02 twice and then had blood drawn.  Denies being drunk.  Feels like he was charged bc speech affected by not having teeth.   Cost of legal aspect is frustrating. Doing ok with meds.  Tolerating and benefits. Plan: No med changes  03/21/2021 appointment with the following noted: Attorney has not been very responsive on his legal problem.  Feels this is an injustice. New court date 04/11/21.  Sleeps with quetiapine. Not depressed but ongoing stress  and anxiety problems. Denies substance abuse. Plan no med changes  06/03/2021 appointment with the following noted: I'm OK.  B in-law suicided recently and they were close. Has used up to quetiapine 600 mg for sleep. Court Thursday upcoming. Still dealing with care-taking mother. No cocaine in a long time.  06/25/21 appt noted Next court date is December 15.   Nothing bad is going on.  Moving and pleased with that to [place] next door to son.  Will feel safer in there. About November 1.   No problems with meds.  Overall satisfied with meds and doesn't want a change. Plan no med changes  09/04/2021 appt noted: Court continued case.  1 year ago and hanging over his head. Patient reports stable mood and denies depressed or irritable moods.  Patient has intermittent difficulty with anxiety.  Patient  denies difficulty with sleep initiation or maintenance. Denies appetite disturbance.  Patient reports that energy and motivation have been good.  Patient denies any difficulty with concentration.  Patient denies any suicidal ideation. Tolerating meds. Disc getting teeth fixed. Episodic alcohol abuse and too much caffeine.   Plan: quetiapine 400-600 HS for anxiety and insomnia and mood sx Continue fluoxetine 40 mg daily. Consider decrease Continue Adderall 20  mg BID and 10 mg 4 PM  daily,  continue hydroxyzine 25 mg every 6 hours as needed,  continue Lyrica 150 mg twice daily for pain and anxiety,  continue Ambien 15 mg nightly  11/05/2021 appointment with the following noted: 09/23/2021 emergency room visit for seizure.  Work-up in the ER was unremarkable.  He was encouraged to gradually decrease his drinking. 10/03/2021 neurology evaluation by Dr. Krista Blue.  Started lamotrigine and increased to 100 mg twice daily who also recommended he stop excessive use of alcohol and recommended that he stop Adderall. Last picked up Adderall on 10/02/21 #75. He says maybe he got his meds mixed up t the time  of the SZ.   Can't drive DT SZ for 6 mos until SZ free.  He accepts this.   Been sleeping more lately since he can't drive.   Varies Seroquel 300-400 mg HS. Denies cocaine or other stimulant abuse.  Uses pot. Plan: quetiapine 400-600 HS for anxiety and insomnia and mood sx Continue fluoxetine 40 mg daily. Consider decrease continue hydroxyzine 25 mg every 6 hours as needed,  continue Lyrica 150 mg twice daily for pain and anxiety,  continue Ambien 15 mg nightly DC Adderall DT SZ 09/23/21  01/13/2022 appointment with the following noted: Gets his teeth next week.   Still hasn't heard from [place] around legal issues. Not doing as good being home bound.  Still struggling to get anything done.  More down.  Some anxiety.   Sleep good or better with Seroquel. Neuro workup negative so far.  03/05/2022 appointment the following noted: Didn't get to go to [place]. Had issues with Herma [facility] arrest from years ago.  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    1 and 1/2 year ago. No alcohol in blood test.  Disc this in detail. Not abusing drugs.  Not driving.  Denies excessive alcohol use. Some chronic anxiety. Needs sleep meds. Tolerating meds.  Benefit of meds.  M 66 yo.  Past Psychiatric Medication Trials: Fluoxetine 40, duloxetine,  Wellbutrin, venlafaxine Olanzapine 10, quetiapine, clonidine, hydroxyzine, Lyrica,trazodone, Ambien,  testosterone,  Adderall,  vitamin D,  Adderall stopped spring 2023 due to seizure on 09/23/2021  , Has been under the care of this practice since November 2000  Review of Systems:  Review of Systems  HENT:  Positive for dental problem.        Dental problems.  Cardiovascular:  Negative for palpitations.  Musculoskeletal:  Positive for arthralgias and back pain. Negative for gait problem.  Skin:  Positive for rash.       Severe itching  Neurological:  Positive for seizures.  Psychiatric/Behavioral:  Positive  for decreased concentration and sleep disturbance. Negative for agitation, behavioral problems, confusion, dysphoric mood, hallucinations, self-injury and suicidal ideas. The patient is nervous/anxious. The patient is not hyperactive.     Medications: I have reviewed the patient's current medications.  Current Outpatient Medications  Medication Sig Dispense Refill   cetaphil (CETAPHIL) lotion Apply 1 application topically as needed for dry skin.  FLUoxetine (PROZAC) 20 MG capsule Take 2 capsules (40 mg total) by mouth daily. 180 capsule 0   hydrOXYzine (ATARAX) 25 MG tablet TAKE 1 TABLET BY MOUTH EVERY 6 HOURS AS NEEDED FOR ITCHING OR ANXIETY 120 tablet 5   lamoTRIgine (LAMICTAL) 100 MG tablet Take 1 tablet (100 mg total) by mouth 2 (two) times daily. 60 tablet 11   metoprolol succinate (TOPROL-XL) 100 MG 24 hr tablet TAKE 1 TABLET BY MOUTH EVERY DAY 30 tablet 2   modafinil (PROVIGIL) 200 MG tablet 1/2 tablet in AM then 1 each AM 30 tablet 1   pregabalin (LYRICA) 150 MG capsule Take 1 capsule (150 mg total) by mouth 2 (two) times daily. 60 capsule 5   QUEtiapine (SEROQUEL) 200 MG tablet TAKE 3 TABLETS (600 MG TOTAL) BY MOUTH AT BEDTIME. 270 tablet 0   Upadacitinib ER (RINVOQ) 15 MG TB24 Take 15 mg by mouth daily.     zolpidem (AMBIEN) 10 MG tablet TAKE 1 TO 1 AND 1/2 TABLETS BY MOUTH AT NIGHT AS NEEDED FOR SLEEP 45 tablet 3   No current facility-administered medications for this visit.    Medication Side Effects: None  Allergies:  Allergies  Allergen Reactions   Codeine Nausea Only    Past Medical History:  Diagnosis Date   ADD (attention deficit disorder)    Anemia    Microcytic   Arthritis    Bipolar disorder (HCC)    Depression    Dyspnea    history of no current issues 05/26/2019   GAD (generalized anxiety disorder)    History of kidney stones    passed   HTN (hypertension)    PTSD (post-traumatic stress disorder)    Skin cancer     Family History  Problem  Relation Age of Onset   Arthritis Other    Heart disease Other    Cancer Other    Hypertension Other     Social History   Socioeconomic History   Marital status: Married    Spouse name: Not on file   Number of children: Not on file   Years of education: Not on file   Highest education level: Not on file  Occupational History   Not on file  Tobacco Use   Smoking status: Every Day    Packs/day: 1.00    Years: 40.00    Total pack years: 40.00    Types: Cigarettes    Last attempt to quit: 04/25/2019    Years since quitting: 2.8   Smokeless tobacco: Never  Vaping Use   Vaping Use: Never used  Substance and Sexual Activity   Alcohol use: Yes    Comment: daily-6-7/day   Drug use: Not Currently    Types: Marijuana   Sexual activity: Not on file  Other Topics Concern   Not on file  Social History Narrative   Not on file   Social Determinants of Health   Financial Resource Strain: Medium Risk (06/23/2018)   Overall Financial Resource Strain (CARDIA)    Difficulty of Paying Living Expenses: Somewhat hard  Food Insecurity: No Food Insecurity (06/23/2018)   Hunger Vital Sign    Worried About Running Out of Food in the Last Year: Never true    Ran Out of Food in the Last Year: Never true  Transportation Needs: No Transportation Needs (06/23/2018)   PRAPARE - Hydrologist (Medical): No    Lack of Transportation (Non-Medical): No  Physical Activity: Insufficiently Active (06/23/2018)  Exercise Vital Sign    Days of Exercise per Week: 4 days    Minutes of Exercise per Session: 30 min  Stress: Unknown (06/23/2018)   [place]    Feeling of Stress : Patient refused  Social Connections: Moderately Integrated (06/23/2018)   Social Connection and Isolation Panel [NHANES]    Frequency of Communication with Friends and Family: Three times a week    Frequency of Social Gatherings with Friends  and Family: Three times a week    Attends Religious Services: 1 to 4 times per year    Active Member of Clubs or Organizations: No    Attends Archivist Meetings: Never    Marital Status: Married  Human resources officer Violence: Not on file    Past Medical History, Surgical history, Social history, and Family history were reviewed and updated as appropriate.   Please see review of systems for further details on the patient's review from today.   Objective:   Physical Exam:  There were no vitals taken for this visit.  Physical Exam Constitutional:      Appearance: Normal appearance.  Skin:    Findings: Rash present. Rash is crusting and urticarial.  Neurological:     Mental Status: He is alert.     Motor: No tremor.     Gait: Gait normal.  Psychiatric:        Attention and Perception: He is inattentive. He does not perceive auditory hallucinations.        Mood and Affect: Mood is anxious. Mood is not depressed. Affect is not labile, angry or tearful.        Speech: Speech is not rapid and pressured.        Behavior: Behavior is hyperactive. Behavior is not agitated or slowed.        Thought Content: Thought content is not paranoid or delusional. Thought content does not include homicidal or suicidal ideation.        Cognition and Memory: Cognition normal.     Comments: Fair insight and judgment. chronically fidgety. Not manic. Ongoing stress No pressure     Lab Review:     Component Value Date/Time   NA 135 09/23/2021 1145   K 4.6 09/23/2021 1145   CL 100 09/23/2021 1145   CO2 28 09/23/2021 1145   GLUCOSE 109 (H) 09/23/2021 1145   BUN 11 09/23/2021 1145   CREATININE 1.11 09/23/2021 1145   CREATININE TEST NOT PERFORMED 09/03/2012 1527   CALCIUM 9.4 09/23/2021 1145   PROT 7.4 09/23/2021 1145   ALBUMIN 4.1 09/23/2021 1145   AST 22 09/23/2021 1145   ALT 19 09/23/2021 1145   ALKPHOS 118 09/23/2021 1145   BILITOT 0.7 09/23/2021 1145   GFRNONAA >60 09/23/2021  1145   GFRNONAA TEST NOT PERFORMED 09/03/2012 1527   GFRAA >60 05/27/2020 0520   GFRAA TEST NOT PERFORMED 09/03/2012 1527       Component Value Date/Time   WBC 10.4 09/23/2021 1145   RBC 4.44 09/23/2021 1145   HGB 12.6 (L) 09/23/2021 1145   HCT 38.9 (L) 09/23/2021 1145   PLT 286 09/23/2021 1145   MCV 87.6 09/23/2021 1145   MCH 28.4 09/23/2021 1145   MCHC 32.4 09/23/2021 1145   RDW 13.8 09/23/2021 1145   LYMPHSABS 2.0 09/23/2021 1145   MONOABS 1.2 (H) 09/23/2021 1145   EOSABS 0.1 09/23/2021 1145   BASOSABS 0.1 09/23/2021 1145    No results found for: "POCLITH", "LITHIUM"  No results found for: "PHENYTOIN", "PHENOBARB", "VALPROATE", "CBMZ"   .res Assessment: Plan:    Gedalia was seen today for follow-up, anxiety, adhd, stress and sleeping problem.  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 We discussed that Ray has been chronically disabled by severe ADD and some depression and anxiety.   More depression because not driving due to the seizure in January.  So far seizure work-up is negative.  He does not have the final results for the MRI or EEG however yet.  He does need the hydroxyzine for itching and anxiety.  Supportive therapy with focus on self care and substance abuse focus.  Maintain sobriety. Supportive therapy re: dealing with mother who hasn't been able to get the vaccine.  Stressed dealing with that.  Also facing court date for DUI for which he feels innocent and reportedly did not blow above legal limit.     Discussed potential benefits, risks, and side effects of stimulants with patient to include increased heart rate, palpitations, insomnia, increased anxiety, increased irritability, or decreased appetite.  Instructed patient to contact office if experiencing any significant tolerability issues.  Cont meds Prozac for depression and anxiety   and Lyrica for chronic pain and off label for anxiety.  Also Ambien for sleep, but rec try wean while on quetiapine which is likely sufficient for sleep.  It has helped him sleep better at times. We discussed side effects in detail including fall risk.  Disc mood effects and other FDA indications for quetiapine. Call if needed for higher dose which could help mood anxiety and sleep and stability.  Disc alcohol use and avoiding excess.  Disc AA.  He needs to stop this to reduce SZ risk. Disc types of drug use, misuse, withdrawal that could increase SZ risk. Specifically denies using stimulant likes cocaine abuse before SZ or after.    Discussed potential metabolic side effects associated with atypical antipsychotics, as well as potential risk for movement side effects. Advised pt to contact office if movement side effects occur.   quetiapine 400-600 HS for anxiety and insomnia and mood sx Continue fluoxetine 40 mg daily. Consider decrease continue hydroxyzine 25 mg every 6 hours as needed,  continue Lyrica 150 mg twice daily for pain and anxiety,  continue Ambien 15 mg nightly  DC Adderall DT SZ 09/23/21 Disc risk of repeated SZ and restrictions on driving.  No SZ.  Disc his desire to resume Adderall bc he thinks the sz related to combining meds and alcohol but can't risk RX Adderall to him again.  Answered questions about Vraylar.  Is not a good option with the quetiapine preferably.  We will reevaluate at his follow-up in June.  When she is driving again the depression may lift.  ADD much worse off stimulant and can't get something done.  Modafinil less  likely to affect sz threshold.  Has been used successfully off label for ADD.  It has a low seizure risk compared to Adderall and may be a reasonable alternative for his ADD symptoms.  He  is struggling with productivity and concentration.  This is contributing to some depression. Modafinil 100-then 200 mg AM  Problem solving around legal problems with license.  Not clear he was ever actually impaired based on available information.    This appt was 30 mins.  Fu 2 mos  Lynder Parents, MD, DFAPA   Please see After Visit Summary for patient specific instructions.  Future Appointments  Date Time Provider [place]  03/17/2022  9:00 AM Ward Givens, NP [facility] None     No orders of the defined types were placed in this encounter.      -------------------------------

## 2022-03-12 ENCOUNTER — Telehealth: Payer: Self-pay

## 2022-03-12 NOTE — Telephone Encounter (Signed)
Prior Authorization submitted and approved for MODAFINIL 200 MG effective through 09/11/2022 with [facility] PA# [id number]

## 2022-03-17 ENCOUNTER — Ambulatory Visit: Payer: Medicare Other | Admitting: Adult Health

## 2022-03-17 ENCOUNTER — Ambulatory Visit (INDEPENDENT_AMBULATORY_CARE_PROVIDER_SITE_OTHER): Payer: Medicare HMO | Admitting: Adult Health

## 2022-03-17 VITALS — BP 131/86 | HR 97 | Ht 68.0 in | Wt 180.1 lb

## 2022-03-17 DIAGNOSIS — G40909 Epilepsy, unspecified, not intractable, without status epilepticus: Secondary | ICD-10-CM

## 2022-03-17 NOTE — Progress Notes (Signed)
PATIENT: David [name] DOB: [birth year]  REASON FOR VISIT: follow up David FROM: patient PRIMARY NEUROLOGIST: Dr. Krista Blue   Chief Complaint  Patient presents with   Follow-up    Pt reports feeling really good. He states he hasn't been having any seizures recently. No questions or concerns. Wife and him do want to talk about the MRI. Room 19, with wife     David OF PRESENT ILLNESS: Today 03/17/22:  David [name] is a 66 year old male with a David of seizures. She returns today for follow-up. On lamictal 100 mg BID. No additional seizure events. Just started back driving. He is retired. Initally when he started lamictal he was having some falls. No injuries.   David [name], this 66 year old male, seen in request by Dr. Isla Pence, for evaluation of seizure, his primary care physician is Dr., Melford Aase, Rebeca Alert, initial evaluation was with his wife on October 03, 2021.   I reviewed and summarized the referring note. PMHX. ADD on adderall '20mg'$  1/1/0.5='50mg'$ , Dr. [name] Depression, on prozac '20mg'$  qhs, seroquel '200mg'$  -'300mg'$  qhs. Chronic insomnia Psoriasis PTSD Lumbar decompression Left hip surgery Right femur surgery.   Patient retired as a Art gallery manager, does drink 5-6 beers every day, regular Coke 12 cans each day  First seizure was witnessed by his wife on September 23, 2021, there was sitting on sofa, he suddenly fell to the ground, whole body convulsion, loud snoring sound, foaming, out of his mouth, breathing heavily, eyes was wide open, holding breath,  Paramedic was called, he was taken to emergency room Personally reviewed CT head without contrast, there was no acute abnormality, mild generalized atrophy small vessel disease  MRI of the brain September 21, there was no acute abnormality, patient was evaluated for sudden onset confusion, shortness of breath, with alcohol level less than 10,  UDS was positive for amphetamine and marijuana, EEG showed moderate slowing,  blood pressure was elevated upon presentation 220/110, was diagnosed with hypertensive encephalopathy, likely component of alcohol withdrawal  He does have a David of ADHD, has been on Adderall up to 50 mg daily this was prescribed by his psychiatrist Dr. [name]   October 01, 2021, his stand up after watching TV for a long time, felt lightheaded, tried to brace himself, fell to the floor, no loss of consciousness, did report heart racing fast, vision going up went out, muffled sound, when wife came home, the side table was pushed away, Coke spilled on the floor, he was lying on the sofa confused,   Mother suffered Alzheimer's disease, he was noted to have gradual worsening memory loss over the past few years, MoCA examination 20/30  REVIEW OF SYSTEMS: Out of a complete 14 system review of symptoms, the patient complains only of the following symptoms, and all other reviewed systems are negative.  ALLERGIES: Allergies  Allergen Reactions   Codeine Nausea Only    HOME MEDICATIONS: Outpatient Medications Prior to Visit  Medication Sig Dispense Refill   cetaphil (CETAPHIL) lotion Apply 1 application topically as needed for dry skin.      FLUoxetine (PROZAC) 20 MG capsule Take 2 capsules (40 mg total) by mouth daily. 180 capsule 0   hydrOXYzine (ATARAX) 25 MG tablet TAKE 1 TABLET BY MOUTH EVERY 6 HOURS AS NEEDED FOR ITCHING OR ANXIETY 120 tablet 5   lamoTRIgine (LAMICTAL) 100 MG tablet Take 1 tablet (100 mg total) by mouth 2 (two) times daily. 60 tablet 11   metoprolol succinate (TOPROL-XL) 100  MG 24 hr tablet TAKE 1 TABLET BY MOUTH EVERY DAY 30 tablet 2   modafinil (PROVIGIL) 200 MG tablet 1/2 tablet in AM then 1 each AM 30 tablet 1   pregabalin (LYRICA) 150 MG capsule Take 1 capsule (150 mg total) by mouth 2 (two) times daily. 60 capsule 5   QUEtiapine (SEROQUEL) 200 MG tablet TAKE 3 TABLETS (600 MG TOTAL) BY MOUTH AT BEDTIME. 270 tablet 0   Upadacitinib ER (RINVOQ) 15 MG TB24 Take 15 mg by  mouth daily.     zolpidem (AMBIEN) 10 MG tablet TAKE 1 TO 1 AND 1/2 TABLETS BY MOUTH AT NIGHT AS NEEDED FOR SLEEP 45 tablet 3   No facility-administered medications prior to visit.    PAST MEDICAL David: Past Medical David:  Diagnosis Date   ADD (attention deficit disorder)    Anemia    Microcytic   Arthritis    Bipolar disorder ([place])    Depression    Dyspnea    David of no current issues 05/26/2019   GAD (generalized anxiety disorder)    David of kidney stones    passed   HTN (hypertension)    PTSD (post-traumatic stress disorder)    Skin cancer     PAST SURGICAL David: Past Surgical David:  Procedure Laterality Date   BACK SURGERY     Fusion - lumbar   HIP PINNING,CANNULATED Right 06/24/2018   Procedure: RIGHT CANNULATED HIP PINNING;  Surgeon: Erle Crocker, MD;  Location: [facility];  Service: Orthopedics;  Laterality: Right;   LITHOTRIPSY     MULTIPLE EXTRACTIONS WITH ALVEOLOPLASTY Bilateral 05/03/2019   Procedure: MULTIPLE EXTRACTION TEETH NUMBER TWO, THREE, FIVE, SIX, SEVEN, EIGHT, NINE, TEN, ELEVEN, FIFTEEN, SIXTEEN, SEVENTEEN, NINETEEN, TWENTY, TWENTY-ONE, TWENTY-TWO, TWENTY-THREE, TWENTY-FOUR, TWENTY-FIVE, TWENTY-SIX, TWENTY-SEVEN, TWENTY-EIGHT, TWENTY-NINE, THIRTY-TWO WITH ALVEOLOPLASTY;  Surgeon: Diona Browner, DDS;  Location: [facility];  Service: Oral Surgery;  Laterality: Bilateral;   NOSE SURGERY     x3   TOTAL HIP ARTHROPLASTY Left 05/27/2019   Procedure: TOTAL HIP ARTHROPLASTY ANTERIOR APPROACH;  Surgeon: Dorna Leitz, MD;  Location: [facility];  Service: Orthopedics;  Laterality: Left;    FAMILY David: Family David  Problem Relation Age of Onset   Arthritis Other    Heart disease Other    Cancer Other    Hypertension Other     SOCIAL David: Social David   Socioeconomic David   Marital status: Married    Spouse name: Not on file   Number of children: Not on file   Years of education: Not on file   Highest education level: Not on  file  Occupational David   Not on file  Tobacco Use   Smoking status: Every Day    Packs/day: 1.00    Years: 40.00    Total pack years: 40.00    Types: Cigarettes    Last attempt to quit: 04/25/2019    Years since quitting: 2.8   Smokeless tobacco: Never  Vaping Use   Vaping Use: Never used  Substance and Sexual Activity   Alcohol use: Yes    Comment: daily-6-7/day   Drug use: Not Currently    Types: Marijuana   Sexual activity: Not on file  Other Topics Concern   Not on file  Social David Narrative   Not on file   Social Determinants of Health   Financial Resource Strain: Medium Risk (06/23/2018)   Overall Financial Resource Strain (CARDIA)    Difficulty of Paying Living Expenses: Somewhat hard  Food Insecurity:  No Food Insecurity (06/23/2018)   Hunger Vital Sign    Worried About Running Out of Food in the Last Year: Never true    Ran Out of Food in the Last Year: Never true  Transportation Needs: No Transportation Needs (06/23/2018)   PRAPARE - Hydrologist (Medical): No    Lack of Transportation (Non-Medical): No  Physical Activity: Insufficiently Active (06/23/2018)   Exercise Vital Sign    Days of Exercise per Week: 4 days    Minutes of Exercise per Session: 30 min  Stress: Unknown (06/23/2018)   [place]    Feeling of Stress : Patient refused  Social Connections: Moderately Integrated (06/23/2018)   Social Connection and Isolation Panel [NHANES]    Frequency of Communication with Friends and Family: Three times a week    Frequency of Social Gatherings with Friends and Family: Three times a week    Attends Religious Services: 1 to 4 times per year    Active Member of Clubs or Organizations: No    Attends Archivist Meetings: Never    Marital Status: Married  Human resources officer Violence: Not on file      PHYSICAL EXAM  Vitals:   03/17/22 0845  BP:  131/86  Pulse: 97  Weight: 180 lb 2 oz (81.7 kg)  Height: '5\' 8"'$  (1.727 m)   Body mass index is 27.39 kg/m.      No data to display             No data to display            03/17/2022    8:47 AM 10/03/2021   11:00 AM  Montreal Cognitive Assessment   Visuospatial/ Executive (0/5) 4 3  Naming (0/3) 3 3  Attention: Read list of digits (0/2) 2 2  Attention: Read list of letters (0/1) 1 0  Attention: Serial 7 subtraction starting at 100 (0/3) 1 1  Language: Repeat phrase (0/2) 2 2  Language : Fluency (0/1) 1 0  Abstraction (0/2) 2 2  Delayed Recall (0/5) 5 1  Orientation (0/6) 6 6  Total 27 20     Generalized: Well developed, in no acute distress   Neurological examination  Mentation: Alert oriented to time, place, David taking. Follows all commands speech and language fluent Cranial nerve II-XII: Pupils were equal round reactive to light. Extraocular movements were full, visual field were full on confrontational test. Facial sensation and strength were normal. Head turning and shoulder shrug  were normal and symmetric. Motor: The motor testing reveals 5 over 5 strength of all 4 extremities. Good symmetric motor tone is noted throughout.  Sensory: Sensory testing is intact to soft touch on all 4 extremities. No evidence of extinction is noted.  Coordination: Cerebellar testing reveals good finger-nose-finger and heel-to-shin bilaterally.  Gait and station: Gait is normal.  Reflexes: Deep tendon reflexes are symmetric and normal bilaterally.   DIAGNOSTIC DATA (LABS, IMAGING, TESTING) - I reviewed patient records, labs, notes, testing and imaging myself where available.  Lab Results  Component Value Date   WBC 10.4 09/23/2021   HGB 12.6 (L) 09/23/2021   HCT 38.9 (L) 09/23/2021   MCV 87.6 09/23/2021   PLT 286 09/23/2021      Component Value Date/Time   NA 135 09/23/2021 1145   K 4.6 09/23/2021 1145   CL 100 09/23/2021 1145   CO2 28 09/23/2021 1145   GLUCOSE  109 (  H) 09/23/2021 1145   BUN 11 09/23/2021 1145   CREATININE 1.11 09/23/2021 1145   CREATININE TEST NOT PERFORMED 09/03/2012 1527   CALCIUM 9.4 09/23/2021 1145   PROT 7.4 09/23/2021 1145   ALBUMIN 4.1 09/23/2021 1145   AST 22 09/23/2021 1145   ALT 19 09/23/2021 1145   ALKPHOS 118 09/23/2021 1145   BILITOT 0.7 09/23/2021 1145   GFRNONAA >60 09/23/2021 1145   GFRNONAA TEST NOT PERFORMED 09/03/2012 1527   GFRAA >60 05/27/2020 0520   GFRAA TEST NOT PERFORMED 09/03/2012 1527   No results found for: "CHOL", "HDL", "LDLCALC", "LDLDIRECT", "TRIG", "CHOLHDL" No results found for: "HGBA1C" Lab Results  Component Value Date   VITAMINB12 132 (L) 06/24/2018   Lab Results  Component Value Date   TSH TEST NOT PERFORMED 09/03/2012      ASSESSMENT AND PLAN 66 y.o. year old male  has a past medical David of ADD (attention deficit disorder), Anemia, Arthritis, Bipolar disorder ([place]), Depression, Dyspnea, GAD (generalized anxiety disorder), David of kidney stones, HTN (hypertension), PTSD (post-traumatic stress disorder), and Skin cancer. here with:  Seizures  Continue Lamictal 100 mg BID Blood work today Advised to call if he has any seizure events FU in 1 year or sooner if needed     Ward Givens, MSN, NP-C 03/17/2022, 8:51 AM [facility] [address] [place], [place] [address] [phone]

## 2022-03-17 NOTE — Patient Instructions (Signed)
Your Plan:  Continue lamictal  Blood work today If your symptoms worsen or you develop new symptoms please let us know.    Thank you for coming to see [place] at [facility]. I hope we have been able to provide you high quality care today.  You may receive a patient satisfaction survey over the next few weeks. We would appreciate your feedback and comments so that we may continue to improve ourselves and the health of our patients.

## 2022-03-20 LAB — CBC WITH DIFFERENTIAL/PLATELET
Basophils Absolute: 0 10*3/uL (ref 0.0–0.2)
Basos: 0 %
EOS (ABSOLUTE): 0.1 10*3/uL (ref 0.0–0.4)
Eos: 1 %
Hematocrit: 36.1 % — ABNORMAL LOW (ref 37.5–51.0)
Hemoglobin: 11.7 g/dL — ABNORMAL LOW (ref 13.0–17.7)
Immature Grans (Abs): 0.1 10*3/uL (ref 0.0–0.1)
Immature Granulocytes: 1 %
Lymphocytes Absolute: 2.9 10*3/uL (ref 0.7–3.1)
Lymphs: 29 %
MCH: 27.5 pg (ref 26.6–33.0)
MCHC: 32.4 g/dL (ref 31.5–35.7)
MCV: 85 fL (ref 79–97)
Monocytes Absolute: 0.9 10*3/uL (ref 0.1–0.9)
Monocytes: 9 %
Neutrophils Absolute: 5.9 10*3/uL (ref 1.4–7.0)
Neutrophils: 60 %
Platelets: 356 10*3/uL (ref 150–450)
RBC: 4.26 x10E6/uL (ref 4.14–5.80)
RDW: 14.5 % (ref 11.6–15.4)
WBC: 9.8 10*3/uL (ref 3.4–10.8)

## 2022-03-20 LAB — COMPREHENSIVE METABOLIC PANEL
ALT: 15 IU/L (ref 0–44)
AST: 19 IU/L (ref 0–40)
Albumin/Globulin Ratio: 1.8 (ref 1.2–2.2)
Albumin: 4.3 g/dL (ref 3.8–4.8)
Alkaline Phosphatase: 155 IU/L — ABNORMAL HIGH (ref 44–121)
BUN/Creatinine Ratio: 12 (ref 10–24)
BUN: 14 mg/dL (ref 8–27)
Bilirubin Total: <0.2 mg/dL (ref 0.0–1.2)
CO2: 23 mmol/L (ref 20–29)
Calcium: 9.5 mg/dL (ref 8.6–10.2)
Chloride: 101 mmol/L (ref 96–106)
Creatinine, Ser: 1.18 mg/dL (ref 0.76–1.27)
Globulin, Total: 2.4 g/dL (ref 1.5–4.5)
Glucose: 87 mg/dL (ref 70–99)
Potassium: 4.8 mmol/L (ref 3.5–5.2)
Sodium: 139 mmol/L (ref 134–144)
Total Protein: 6.7 g/dL (ref 6.0–8.5)
eGFR: 68 mL/min/{1.73_m2} (ref >59)

## 2022-03-20 LAB — LAMOTRIGINE LEVEL: Lamotrigine Lvl: 3 ug/mL (ref 2.0–20.0)

## 2022-03-22 ENCOUNTER — Other Ambulatory Visit: Payer: Self-pay | Admitting: Psychiatry

## 2022-03-22 DIAGNOSIS — F411 Generalized anxiety disorder: Secondary | ICD-10-CM

## 2022-04-07 ENCOUNTER — Encounter: Payer: Self-pay | Admitting: *Deleted

## 2022-04-13 ENCOUNTER — Encounter: Payer: Self-pay | Admitting: Neurology

## 2022-04-14 ENCOUNTER — Telehealth: Payer: Self-pay

## 2022-04-14 NOTE — Telephone Encounter (Signed)
Pt just had an approval for Modafinil through [facility] on 03/12/22 but looks like it has changed to [facility] now. Will submit a PA just need to clarify instructions and diagnosis with Dr. [name] first.

## 2022-04-15 ENCOUNTER — Other Ambulatory Visit: Payer: Self-pay | Admitting: Psychiatry

## 2022-04-15 DIAGNOSIS — F902 Attention-deficit hyperactivity disorder, combined type: Secondary | ICD-10-CM

## 2022-04-15 MED ORDER — MODAFINIL 200 MG PO TABS: ORAL_TABLET | ORAL | 1 refills | Status: DC

## 2022-04-16 ENCOUNTER — Telehealth: Payer: Self-pay

## 2022-04-16 NOTE — Telephone Encounter (Signed)
Prior Approval received for Modafinil 200 mg #45/30 day with [employer] effective 09/15/2021-09/14/2022 PA# [id number]

## 2022-04-18 NOTE — Telephone Encounter (Signed)
His prior authorization with [employer] is approved for MODAFINIL 200 MG 1.5 TABS DAILY #45 through 09/14/2022

## 2022-04-24 ENCOUNTER — Other Ambulatory Visit: Payer: Self-pay

## 2022-04-24 ENCOUNTER — Telehealth: Payer: Self-pay | Admitting: Psychiatry

## 2022-04-24 MED ORDER — ZOLPIDEM TARTRATE 10 MG PO TABS
ORAL_TABLET | ORAL | 3 refills | Status: DC
Start: 2022-04-24 — End: 2022-10-17

## 2022-04-24 NOTE — Telephone Encounter (Signed)
Pt requesting new Rx for Zolpidem no RF. [facility]

## 2022-04-24 NOTE — Telephone Encounter (Signed)
Pended please schedule appt

## 2022-04-28 NOTE — Telephone Encounter (Signed)
Apt 11/7 RS from 9/11 CC vacation. Also, on canc list.

## 2022-05-06 ENCOUNTER — Other Ambulatory Visit: Payer: Self-pay | Admitting: Psychiatry

## 2022-05-06 DIAGNOSIS — F5105 Insomnia due to other mental disorder: Secondary | ICD-10-CM

## 2022-05-06 DIAGNOSIS — F308 Other manic episodes: Secondary | ICD-10-CM

## 2022-05-22 ENCOUNTER — Telehealth: Payer: Self-pay

## 2022-05-22 NOTE — Telephone Encounter (Signed)
Prior Authorization initiated and approved for ZOLPIDEM 10 MG tabs #45/30 day effective through 09/14/2022 with [facility] Medicare PA# [id number]

## 2022-05-26 ENCOUNTER — Ambulatory Visit (INDEPENDENT_AMBULATORY_CARE_PROVIDER_SITE_OTHER): Payer: Medicare Other | Admitting: Psychiatry

## 2022-07-03 ENCOUNTER — Ambulatory Visit: Payer: Medicare HMO | Admitting: Psychiatry

## 2022-07-21 ENCOUNTER — Other Ambulatory Visit: Payer: Self-pay | Admitting: Psychiatry

## 2022-07-21 DIAGNOSIS — G8929 Other chronic pain: Secondary | ICD-10-CM

## 2022-07-21 DIAGNOSIS — F411 Generalized anxiety disorder: Secondary | ICD-10-CM

## 2022-07-22 ENCOUNTER — Encounter: Payer: Self-pay | Admitting: Psychiatry

## 2022-07-22 ENCOUNTER — Ambulatory Visit (INDEPENDENT_AMBULATORY_CARE_PROVIDER_SITE_OTHER): Payer: Medicare HMO | Admitting: Psychiatry

## 2022-07-22 DIAGNOSIS — G8929 Other chronic pain: Secondary | ICD-10-CM

## 2022-07-22 DIAGNOSIS — F411 Generalized anxiety disorder: Secondary | ICD-10-CM | POA: Diagnosis not present

## 2022-07-22 DIAGNOSIS — F902 Attention-deficit hyperactivity disorder, combined type: Secondary | ICD-10-CM | POA: Diagnosis not present

## 2022-07-22 DIAGNOSIS — F308 Other manic episodes: Secondary | ICD-10-CM

## 2022-07-22 DIAGNOSIS — M5440 Lumbago with sciatica, unspecified side: Secondary | ICD-10-CM

## 2022-07-22 DIAGNOSIS — F1011 Alcohol abuse, in remission: Secondary | ICD-10-CM

## 2022-07-22 DIAGNOSIS — F5105 Insomnia due to other mental disorder: Secondary | ICD-10-CM
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
## 2022-07-27 ENCOUNTER — Other Ambulatory Visit: Payer: Self-pay | Admitting: Psychiatry

## 2022-07-27 DIAGNOSIS — F411 Generalized anxiety disorder: Secondary | ICD-10-CM

## 2022-08-02 ENCOUNTER — Other Ambulatory Visit: Payer: Self-pay | Admitting: Psychiatry

## 2022-08-02 DIAGNOSIS — F902 Attention-deficit hyperactivity disorder, combined type: Secondary | ICD-10-CM

## 2022-08-05 ENCOUNTER — Telehealth: Payer: Self-pay | Admitting: Psychiatry

## 2022-08-05 ENCOUNTER — Other Ambulatory Visit: Payer: Self-pay | Admitting: Psychiatry

## 2022-08-05 DIAGNOSIS — F308 Other manic episodes: Secondary | ICD-10-CM

## 2022-08-05 DIAGNOSIS — F5105 Insomnia due to other mental disorder: Secondary | ICD-10-CM

## 2022-08-05 DIAGNOSIS — F411 Generalized anxiety disorder: Secondary | ICD-10-CM

## 2022-08-05 NOTE — Telephone Encounter (Signed)
Filled 9/29 appt 10/08/22

## 2022-08-05 NOTE — Telephone Encounter (Signed)
Pt called requesting RF for Quetiapine, Modafinil, Hydroxyzine and Fluoxetine. [facility]. Apt 1/24

## 2022-08-05 NOTE — Telephone Encounter (Signed)
Pended.

## 2022-08-12 ENCOUNTER — Other Ambulatory Visit: Payer: Self-pay | Admitting: Psychiatry

## 2022-08-12 DIAGNOSIS — F411 Generalized anxiety disorder: Secondary | ICD-10-CM

## 2022-08-12 DIAGNOSIS — G8929 Other chronic pain: Secondary | ICD-10-CM

## 2022-09-12 ENCOUNTER — Other Ambulatory Visit: Payer: Self-pay | Admitting: Psychiatry

## 2022-09-12 DIAGNOSIS — F411 Generalized anxiety disorder: Secondary | ICD-10-CM

## 2022-09-29 ENCOUNTER — Telehealth: Payer: Self-pay

## 2022-09-29 NOTE — Telephone Encounter (Signed)
Prior Authorization submitted and approved for Modafinil 200 mg #30/30 day with [employer] effective through 09/15/2023  [id number]

## 2022-10-08 ENCOUNTER — Ambulatory Visit: Payer: Medicare HMO | Admitting: Psychiatry

## 2022-10-08 NOTE — Progress Notes (Signed)
David [name] No show

## 2022-10-13 ENCOUNTER — Other Ambulatory Visit: Payer: Self-pay | Admitting: Psychiatry

## 2022-10-13 DIAGNOSIS — F308 Other manic episodes: Secondary | ICD-10-CM

## 2022-10-13 DIAGNOSIS — F5105 Insomnia due to other mental disorder: Secondary | ICD-10-CM

## 2022-10-17 ENCOUNTER — Other Ambulatory Visit: Payer: Self-pay | Admitting: Psychiatry

## 2022-10-17 ENCOUNTER — Telehealth: Payer: Self-pay | Admitting: Psychiatry

## 2022-10-17 DIAGNOSIS — G8929 Other chronic pain: Secondary | ICD-10-CM

## 2022-10-17 DIAGNOSIS — F411 Generalized anxiety disorder: Secondary | ICD-10-CM

## 2022-10-17 MED ORDER — PREGABALIN 150 MG PO CAPS: ORAL_CAPSULE | ORAL | 1 refills | Status: DC

## 2022-10-17 NOTE — Telephone Encounter (Signed)
Pt called and said that he needs two refills. One is for ambien 10 mg and the other is lyrica 150 mg. The pharmacy is [facility] [address]

## 2022-10-17 NOTE — Telephone Encounter (Signed)
Pended.

## 2022-11-05 ENCOUNTER — Ambulatory Visit (INDEPENDENT_AMBULATORY_CARE_PROVIDER_SITE_OTHER): Payer: Medicare HMO | Admitting: Psychiatry

## 2022-11-05 ENCOUNTER — Other Ambulatory Visit: Payer: Self-pay | Admitting: Psychiatry

## 2022-11-05 ENCOUNTER — Encounter: Payer: Self-pay | Admitting: Psychiatry

## 2022-11-05 VITALS — BP 171/110 | HR 105

## 2022-11-05 DIAGNOSIS — G8929 Other chronic pain: Secondary | ICD-10-CM

## 2022-11-05 DIAGNOSIS — I10 Essential (primary) hypertension: Secondary | ICD-10-CM

## 2022-11-05 DIAGNOSIS — F411 Generalized anxiety disorder: Secondary | ICD-10-CM | POA: Diagnosis not present

## 2022-11-05 DIAGNOSIS — F39 Unspecified mood [affective] disorder: Secondary | ICD-10-CM

## 2022-11-05 DIAGNOSIS — M5440 Lumbago with sciatica, unspecified side: Secondary | ICD-10-CM

## 2022-11-05 DIAGNOSIS — F5105 Insomnia due to other mental disorder: Secondary | ICD-10-CM | POA: Diagnosis not present

## 2022-11-05 DIAGNOSIS — F902 Attention-deficit hyperactivity disorder, combined type: Secondary | ICD-10-CM | POA: Diagnosis not present

## 2022-11-05 DIAGNOSIS — F308 Other manic episodes: Secondary | ICD-10-CM

## 2022-11-05 MED ORDER — CLONIDINE HCL 0.2 MG PO TABS: 0.2000 mg | ORAL_TABLET | Freq: Two times a day (BID) | ORAL | 1 refills | Status: DC

## 2022-11-05 MED ORDER — FLUOXETINE HCL 20 MG PO CAPS: 40.0000 mg | ORAL_CAPSULE | Freq: Every day | ORAL | 1 refills | Status: DC

## 2022-11-05 MED ORDER — QUETIAPINE FUMARATE 200 MG PO TABS: ORAL_TABLET | ORAL | 0 refills | Status: DC

## 2022-11-05 MED ORDER — PREGABALIN 150 MG PO CAPS: ORAL_CAPSULE | ORAL | 1 refills | Status: DC

## 2022-11-05 MED ORDER — HYDROXYZINE HCL 25 MG PO TABS: ORAL_TABLET | ORAL | 3 refills | Status: DC
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
## 2022-11-05 NOTE — Patient Instructions (Signed)
If modafinil does not help focus, energy, productivity or mood then stop it.

## 2022-11-16 IMAGING — CT CT HEAD W/O CM
4 series · 16 of 47 positions shown, 18 images · non-contrast
Comparison: 05/24/2020

CLINICAL DATA: New onset witnessed seizure

EXAM:
CT HEAD WITHOUT CONTRAST
TECHNIQUE: Contiguous axial images were obtained from the base of the skull
through the vertex without intravenous contrast.

[Series 3: head wo · axial · 0.45mm/px · z∈[+1362,+1466]mm · 7 of 29 slices shown, 9 images]
[im 4/29  brain]
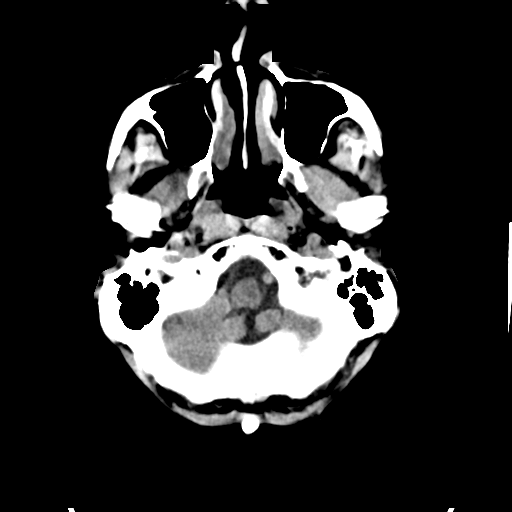
[im 4/29  bone]
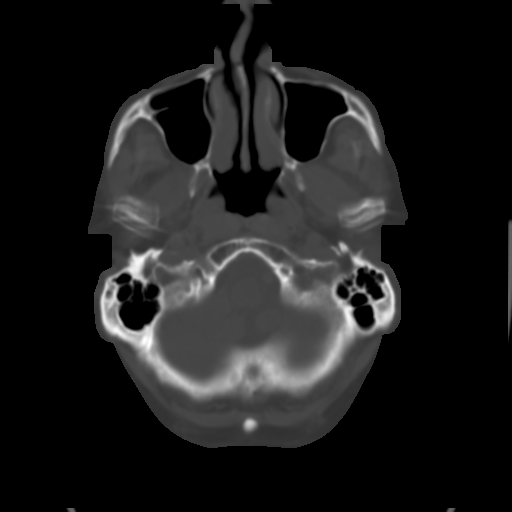
[im 8/29  brain]
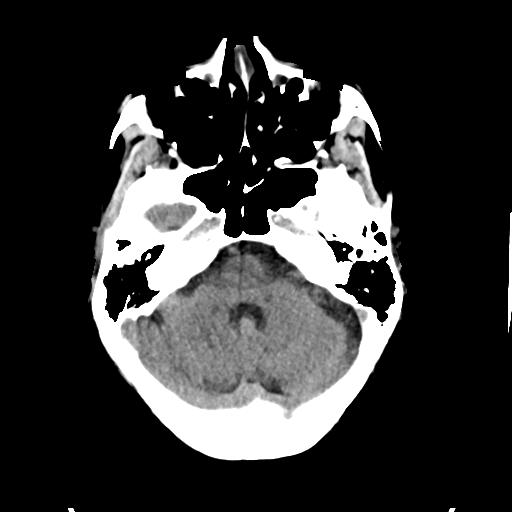
[im 11/29  brain]
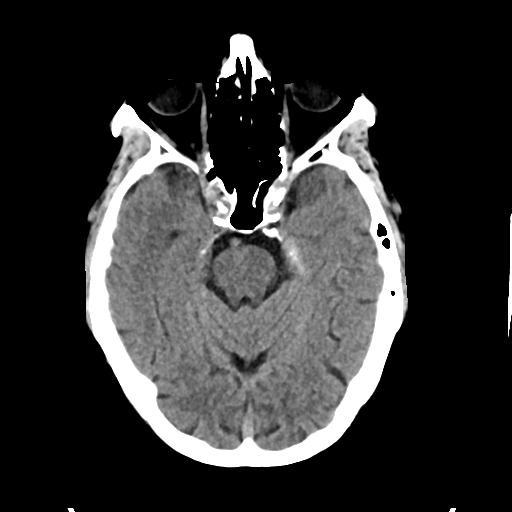
[im 15/29  brain]
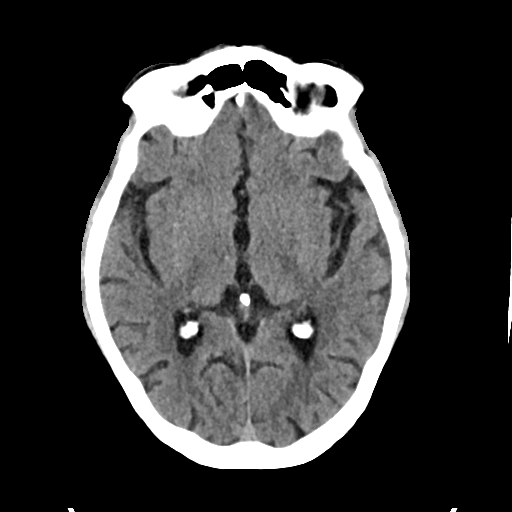
[im 18/29  brain]
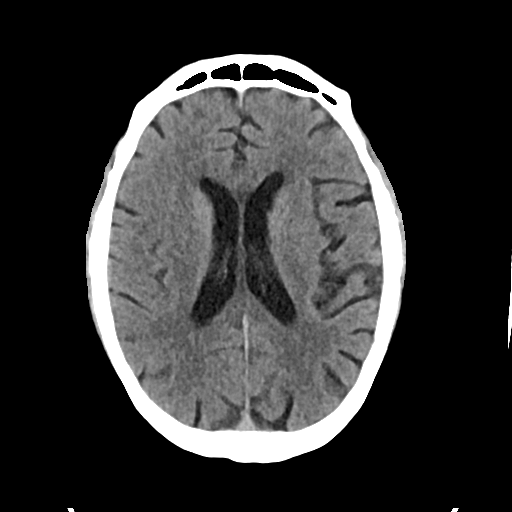
[im 18/29  bone]
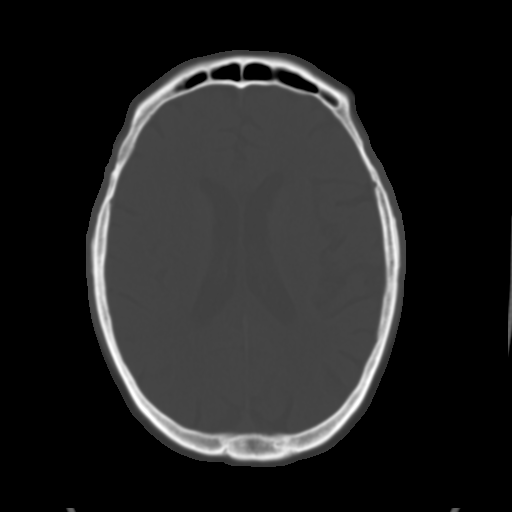
[im 22/29  brain]
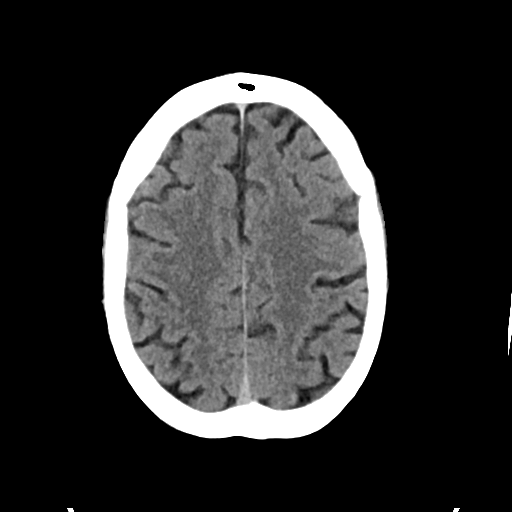
[im 25/29  brain]
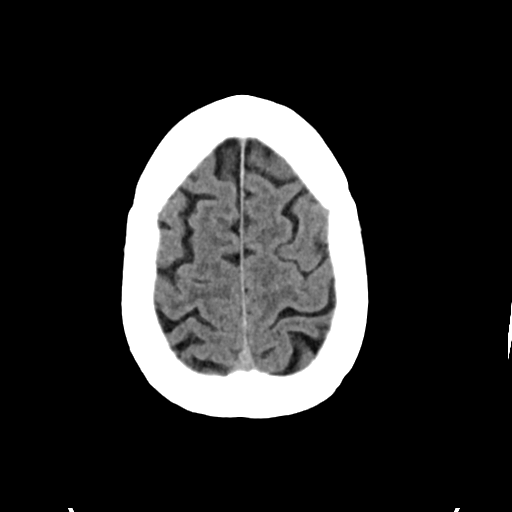

[Series 4: head bone · axial · 0.45mm/px · z∈[+1360,+1388]mm · 3 of 71 slices shown]
[im 8/71  bone]
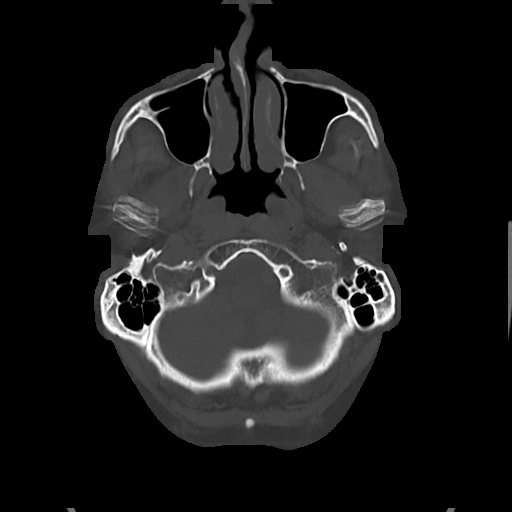
[im 15/71  bone]
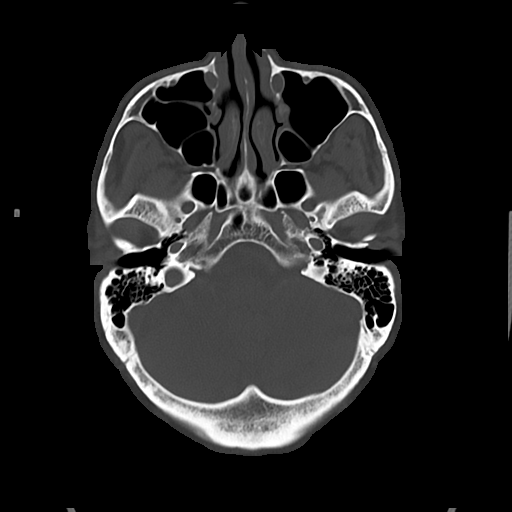
[im 22/71  bone]
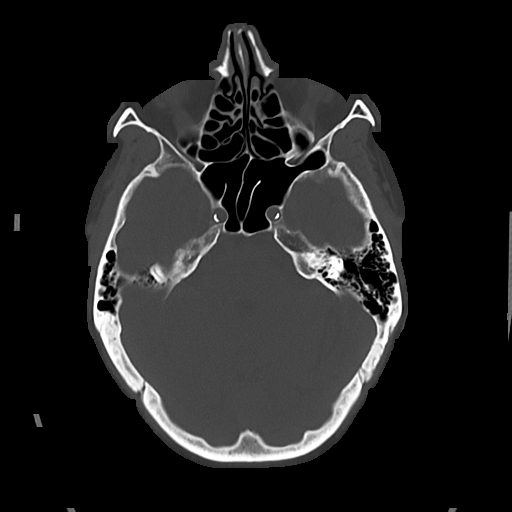

[Series 5: cor soft · coronal · 0.33mm/px · 3 of 71 slices shown]
[im 24/71  brain]
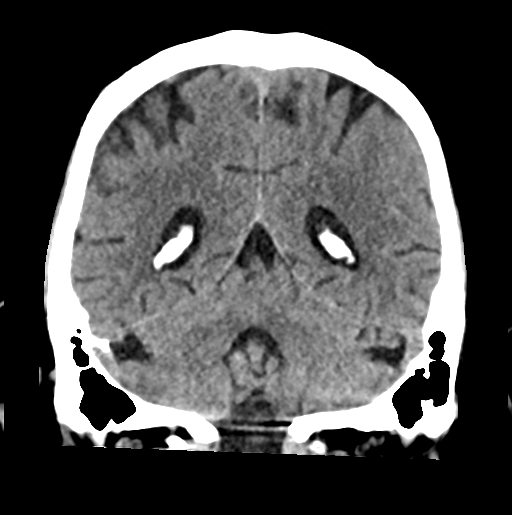
[im 32/71  brain]
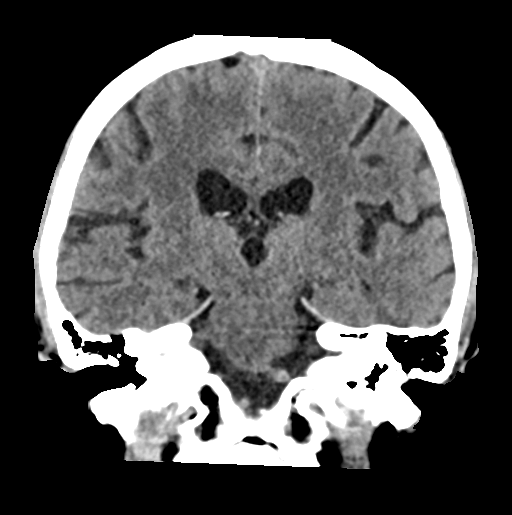
[im 39/71  brain]
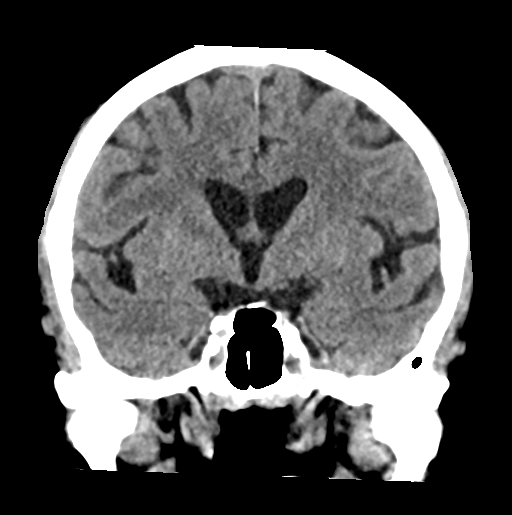

[Series 6: sag soft · sagittal · 0.34mm/px · 3 of 60 slices shown]
[im 20/60  brain]
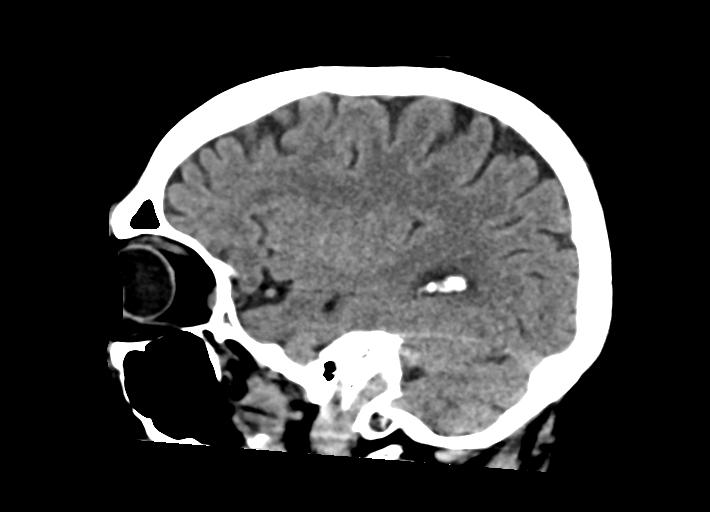
[im 30/60  brain]
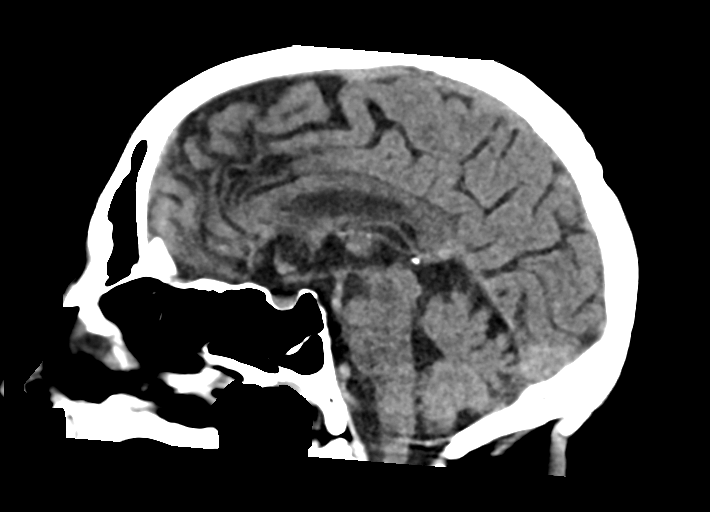
[im 40/60  brain]
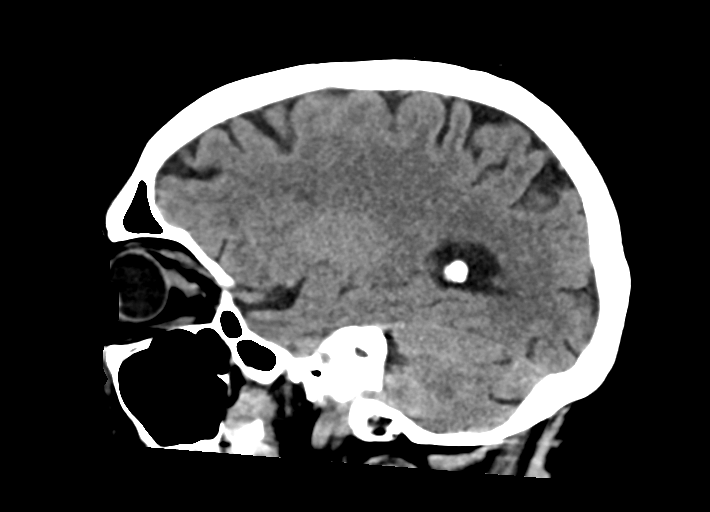

[16 of 47 positions shown; findings below may reference images not displayed]

FINDINGS: Brain: No evidence of acute infarction, hemorrhage, hydrocephalus,
extra-axial collection or mass lesion/mass effect. Scattered
low-density changes within the periventricular and subcortical white
matter compatible with chronic microvascular ischemic change. Mild
diffuse cerebral volume loss.

Vascular: Atherosclerotic calcifications involving the large vessels
of the skull base. No unexpected hyperdense vessel.

Skull: Normal. Negative for fracture or focal lesion.

Sinuses/Orbits: No acute finding.

Other: None.
IMPRESSION: 1. No acute intracranial findings.
2. Mild chronic microvascular ischemic change and cerebral volume
loss.

## 2022-11-17 ENCOUNTER — Other Ambulatory Visit: Payer: Self-pay | Admitting: Psychiatry

## 2022-11-17 DIAGNOSIS — F902 Attention-deficit hyperactivity disorder, combined type: Secondary | ICD-10-CM

## 2022-12-12 ENCOUNTER — Other Ambulatory Visit: Payer: Self-pay | Admitting: Psychiatry

## 2023-01-04 ENCOUNTER — Other Ambulatory Visit: Payer: Self-pay | Admitting: Psychiatry

## 2023-01-04 DIAGNOSIS — I10 Essential (primary) hypertension: Secondary | ICD-10-CM

## 2023-01-04 DIAGNOSIS — G8929 Other chronic pain: Secondary | ICD-10-CM

## 2023-01-04 DIAGNOSIS — F411 Generalized anxiety disorder: Secondary | ICD-10-CM

## 2023-01-04 NOTE — Telephone Encounter (Signed)
Appt 4/23

## 2023-01-06 ENCOUNTER — Ambulatory Visit (INDEPENDENT_AMBULATORY_CARE_PROVIDER_SITE_OTHER): Payer: Medicare HMO | Admitting: Psychiatry

## 2023-01-06 ENCOUNTER — Encounter: Payer: Self-pay | Admitting: Psychiatry

## 2023-01-06 DIAGNOSIS — F39 Unspecified mood [affective] disorder: Secondary | ICD-10-CM

## 2023-01-06 DIAGNOSIS — F5105 Insomnia due to other mental disorder: Secondary | ICD-10-CM

## 2023-01-06 DIAGNOSIS — M5440 Lumbago with sciatica, unspecified side: Secondary | ICD-10-CM

## 2023-01-06 DIAGNOSIS — F1011 Alcohol abuse, in remission: Secondary | ICD-10-CM

## 2023-01-06 DIAGNOSIS — G8929 Other chronic pain: Secondary | ICD-10-CM

## 2023-01-06 DIAGNOSIS — F411 Generalized anxiety disorder: Secondary | ICD-10-CM

## 2023-01-06 DIAGNOSIS — F902 Attention-deficit hyperactivity disorder, combined type: Secondary | ICD-10-CM

## 2023-01-06 DIAGNOSIS — I10 Essential (primary) hypertension: Secondary | ICD-10-CM

## 2023-01-06 MED ORDER — ONDANSETRON HCL 8 MG PO TABS: 8.0000 mg | ORAL_TABLET | Freq: Three times a day (TID) | ORAL | 0 refills | Status: DC | PRN
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
## 2023-01-07 ENCOUNTER — Telehealth: Payer: Self-pay | Admitting: Psychiatry

## 2023-01-07 NOTE — Telephone Encounter (Signed)
David [name] called at 11:55 because he said at his appt yesterday with Dr. [name], he thought two prescriptions were going to be sent in.  He got the Zofran, but he thought Dr. [name] was going to send in a prescription for a muscle relaxer. Send to [id number] - [place], [place] - [address] AT [facility] [address] .  If this is not going to done, please let him know.

## 2023-01-07 NOTE — Telephone Encounter (Signed)
LVM to RC 

## 2023-01-07 NOTE — Telephone Encounter (Signed)
Patient called. He got Rx for Zofran, but thought you were going to send in Rx for a muscle relaxant also. Your note doesn't mention anything about this and I know that isn't something you usually prescribe.

## 2023-01-07 NOTE — Telephone Encounter (Signed)
I know he asked for one but I never agreed to it.  I don't RX muscle relaxers or pain meds.

## 2023-01-07 NOTE — Telephone Encounter (Signed)
Note did not mention a muscle relaxant.

## 2023-01-08 NOTE — Telephone Encounter (Signed)
Notified patient.

## 2023-01-19 ENCOUNTER — Telehealth: Payer: Self-pay

## 2023-01-26 ENCOUNTER — Other Ambulatory Visit: Payer: Self-pay | Admitting: Psychiatry

## 2023-01-26 ENCOUNTER — Other Ambulatory Visit: Payer: Self-pay | Admitting: Neurology

## 2023-01-26 DIAGNOSIS — F411 Generalized anxiety disorder: Secondary | ICD-10-CM

## 2023-02-08 ENCOUNTER — Other Ambulatory Visit: Payer: Self-pay | Admitting: Psychiatry

## 2023-02-08 DIAGNOSIS — F902 Attention-deficit hyperactivity disorder, combined type: Secondary | ICD-10-CM

## 2023-02-18 ENCOUNTER — Ambulatory Visit: Payer: Medicare HMO | Admitting: Psychiatry

## 2023-02-24 ENCOUNTER — Encounter (HOSPITAL_COMMUNITY): Payer: Self-pay

## 2023-02-24 ENCOUNTER — Observation Stay (HOSPITAL_COMMUNITY)
Admission: EM | Admit: 2023-02-24 | Discharge: 2023-02-25 | Disposition: A | Discharge status: Home / Self Care | Payer: Medicare HMO | Attending: Internal Medicine | Admitting: Internal Medicine

## 2023-02-24 ENCOUNTER — Emergency Department (HOSPITAL_COMMUNITY): Payer: Medicare HMO

## 2023-02-24 ENCOUNTER — Other Ambulatory Visit: Payer: Self-pay

## 2023-02-24 DIAGNOSIS — Z85828 Personal history of other malignant neoplasm of skin: Secondary | ICD-10-CM | POA: Diagnosis not present

## 2023-02-24 DIAGNOSIS — R404 Transient alteration of awareness: Secondary | ICD-10-CM | POA: Diagnosis not present

## 2023-02-24 DIAGNOSIS — R55 Syncope and collapse: Principal | ICD-10-CM | POA: Insufficient documentation

## 2023-02-24 DIAGNOSIS — E871 Hypo-osmolality and hyponatremia: Secondary | ICD-10-CM | POA: Diagnosis not present

## 2023-02-24 DIAGNOSIS — Z96642 Presence of left artificial hip joint: Secondary | ICD-10-CM | POA: Diagnosis not present

## 2023-02-24 DIAGNOSIS — D500 Iron deficiency anemia secondary to blood loss (chronic): Secondary | ICD-10-CM | POA: Insufficient documentation

## 2023-02-24 DIAGNOSIS — I10 Essential (primary) hypertension: Secondary | ICD-10-CM | POA: Insufficient documentation

## 2023-02-24 DIAGNOSIS — J9601 Acute respiratory failure with hypoxia: Secondary | ICD-10-CM | POA: Diagnosis not present

## 2023-02-24 DIAGNOSIS — Z79899 Other long term (current) drug therapy: Secondary | ICD-10-CM | POA: Diagnosis not present

## 2023-02-24 DIAGNOSIS — G9340 Encephalopathy, unspecified: Secondary | ICD-10-CM

## 2023-02-24 LAB — COMPREHENSIVE METABOLIC PANEL
ALT: 12 U/L (ref 0–44)
AST: 19 U/L (ref 15–41)
Albumin: 3 g/dL — ABNORMAL LOW (ref 3.5–5.0)
Alkaline Phosphatase: 72 U/L (ref 38–126)
Anion gap: 5 (ref 5–15)
BUN: 10 mg/dL (ref 8–23)
CO2: 23 mmol/L (ref 22–32)
Calcium: 7.7 mg/dL — ABNORMAL LOW (ref 8.9–10.3)
Chloride: 97 mmol/L — ABNORMAL LOW (ref 98–111)
Creatinine, Ser: 1.12 mg/dL (ref 0.61–1.24)
GFR, Estimated: >60 mL/min (ref >60)
Glucose, Bld: 122 mg/dL — ABNORMAL HIGH (ref 70–99)
Potassium: 3.9 mmol/L (ref 3.5–5.1)
Sodium: 125 mmol/L — ABNORMAL LOW (ref 135–145)
Total Bilirubin: 0.6 mg/dL (ref 0.3–1.2)
Total Protein: 6.3 g/dL — ABNORMAL LOW (ref 6.5–8.1)

## 2023-02-24 LAB — I-STAT CHEM 8, ED
BUN: 9 mg/dL (ref 8–23)
Calcium, Ion: 1.07 mmol/L — ABNORMAL LOW (ref 1.15–1.40)
Chloride: 94 mmol/L — ABNORMAL LOW (ref 98–111)
Creatinine, Ser: 1.1 mg/dL (ref 0.61–1.24)
Glucose, Bld: 120 mg/dL — ABNORMAL HIGH (ref 70–99)
HCT: 25 % — ABNORMAL LOW (ref 39.0–52.0)
Hemoglobin: 8.5 g/dL — ABNORMAL LOW (ref 13.0–17.0)
Potassium: 4.3 mmol/L (ref 3.5–5.1)
Sodium: 127 mmol/L — ABNORMAL LOW (ref 135–145)
TCO2: 26 mmol/L (ref 22–32)

## 2023-02-24 LAB — URINALYSIS, ROUTINE W REFLEX MICROSCOPIC
Bilirubin Urine: NEGATIVE
Glucose, UA: NEGATIVE mg/dL
Hgb urine dipstick: NEGATIVE
Ketones, ur: NEGATIVE mg/dL
Leukocytes,Ua: NEGATIVE
Nitrite: NEGATIVE
Protein, ur: NEGATIVE mg/dL
Specific Gravity, Urine: 1.003 — ABNORMAL LOW (ref 1.005–1.030)
pH: 7 (ref 5.0–8.0)

## 2023-02-24 LAB — RAPID URINE DRUG SCREEN, HOSP PERFORMED
Amphetamines: NOT DETECTED
Barbiturates: NOT DETECTED
Benzodiazepines: NOT DETECTED
Cocaine: NOT DETECTED
Opiates: NOT DETECTED
Tetrahydrocannabinol: NOT DETECTED

## 2023-02-24 LAB — CBC WITH DIFFERENTIAL/PLATELET
Abs Immature Granulocytes: 0.04 10*3/uL (ref 0.00–0.07)
Basophils Absolute: 0 10*3/uL (ref 0.0–0.1)
Basophils Relative: 0 %
Eosinophils Absolute: 0 10*3/uL (ref 0.0–0.5)
Eosinophils Relative: 0 %
HCT: 24.3 % — ABNORMAL LOW (ref 39.0–52.0)
Hemoglobin: 8.1 g/dL — ABNORMAL LOW (ref 13.0–17.0)
Immature Granulocytes: 0 %
Lymphocytes Relative: 15 %
Lymphs Abs: 1.4 10*3/uL (ref 0.7–4.0)
MCH: 27.6 pg (ref 26.0–34.0)
MCHC: 33.3 g/dL (ref 30.0–36.0)
MCV: 82.9 fL (ref 80.0–100.0)
Monocytes Absolute: 0.9 10*3/uL (ref 0.1–1.0)
Monocytes Relative: 10 %
Neutro Abs: 7.2 10*3/uL (ref 1.7–7.7)
Neutrophils Relative %: 75 %
Platelets: 274 10*3/uL (ref 150–400)
RBC: 2.93 MIL/uL — ABNORMAL LOW (ref 4.22–5.81)
RDW: 14.6 % (ref 11.5–15.5)
WBC: 9.7 10*3/uL (ref 4.0–10.5)
nRBC: 0 % (ref 0.0–0.2)

## 2023-02-24 LAB — CBG MONITORING, ED: Glucose-Capillary: 115 mg/dL — ABNORMAL HIGH (ref 70–99)

## 2023-02-24 LAB — AMMONIA: Ammonia: 39 umol/L — ABNORMAL HIGH (ref 9–35)

## 2023-02-24 LAB — POC OCCULT BLOOD, ED: Fecal Occult Bld: POSITIVE — AB

## 2023-02-24 LAB — BLOOD GAS, VENOUS
Acid-base deficit: 2.5 mmol/L — ABNORMAL HIGH (ref 0.0–2.0)
Bicarbonate: 24.2 mmol/L (ref 20.0–28.0)
O2 Saturation: 88 %
Patient temperature: 37
pCO2, Ven: 48 mmHg (ref 44–60)
pH, Ven: 7.31 (ref 7.25–7.43)
pO2, Ven: 56 mmHg — ABNORMAL HIGH (ref 32–45)

## 2023-02-24 LAB — ACETAMINOPHEN LEVEL: Acetaminophen (Tylenol), Serum: <10 ug/mL — ABNORMAL LOW (ref 10–30)

## 2023-02-24 LAB — SALICYLATE LEVEL: Salicylate Lvl: <7 mg/dL — ABNORMAL LOW (ref 7.0–30.0)

## 2023-02-24 LAB — ETHANOL: Alcohol, Ethyl (B): <10 mg/dL (ref <10)

## 2023-02-24 LAB — LACTIC ACID, PLASMA: Lactic Acid, Venous: 1.1 mmol/L (ref 0.5–1.9)

## 2023-02-24 LAB — CULTURE, BLOOD (ROUTINE X 2)

## 2023-02-24 MED ORDER — THIAMINE MONONITRATE 100 MG PO TABS
100.0000 mg | ORAL_TABLET | Freq: Every day | ORAL | Status: DC
  Administered 2023-02-24 – 2023-02-25 (×2): 100 mg via ORAL
  Filled 2023-02-24 (×2): qty 1

## 2023-02-24 MED ORDER — NALOXONE HCL 0.4 MG/ML IJ SOLN
INTRAMUSCULAR | Status: AC
  Administered 2023-02-24: 0.4 mg via INTRAVENOUS
  Filled 2023-02-24: qty 1

## 2023-02-24 MED ORDER — ADULT MULTIVITAMIN W/MINERALS CH
1.0000 | ORAL_TABLET | Freq: Every day | ORAL | Status: DC
  Administered 2023-02-24 – 2023-02-25 (×2): 1 via ORAL
  Filled 2023-02-24 (×2): qty 1

## 2023-02-24 MED ORDER — CLONIDINE HCL 0.1 MG PO TABS
0.1000 mg | ORAL_TABLET | Freq: Two times a day (BID) | ORAL | Status: DC
  Filled 2023-02-24: qty 1

## 2023-02-24 MED ORDER — SODIUM CHLORIDE 0.9 % IV SOLN: INTRAVENOUS | Status: DC

## 2023-02-24 MED ORDER — SODIUM CHLORIDE 0.9 % IV BOLUS
1000.0000 mL | Freq: Once | INTRAVENOUS | Status: AC
  Administered 2023-02-24: 1000 mL via INTRAVENOUS

## 2023-02-24 MED ORDER — LAMOTRIGINE 100 MG PO TABS
100.0000 mg | ORAL_TABLET | Freq: Two times a day (BID) | ORAL | Status: DC
  Administered 2023-02-24 – 2023-02-25 (×2): 100 mg via ORAL
  Filled 2023-02-24 (×2): qty 1

## 2023-02-24 MED ORDER — FOLIC ACID 1 MG PO TABS
1.0000 mg | ORAL_TABLET | Freq: Every day | ORAL | Status: DC
  Administered 2023-02-24 – 2023-02-25 (×2): 1 mg via ORAL
  Filled 2023-02-24 (×2): qty 1

## 2023-02-24 MED ORDER — ACETAMINOPHEN 325 MG PO TABS: 650.0000 mg | ORAL_TABLET | Freq: Four times a day (QID) | ORAL | Status: DC | PRN

## 2023-02-24 MED ORDER — PROCHLORPERAZINE EDISYLATE 10 MG/2ML IJ SOLN: 5.0000 mg | Freq: Four times a day (QID) | INTRAMUSCULAR | Status: DC | PRN

## 2023-02-24 MED ORDER — NALOXONE HCL 0.4 MG/ML IJ SOLN: 0.4000 mg | Freq: Once | INTRAMUSCULAR | Status: AC

## 2023-02-24 MED ORDER — LORAZEPAM 2 MG/ML IJ SOLN: 2.0000 mg | Freq: Four times a day (QID) | INTRAMUSCULAR | Status: DC | PRN

## 2023-02-24 MED ORDER — POLYETHYLENE GLYCOL 3350 17 G PO PACK: 17.0000 g | PACK | Freq: Every day | ORAL | Status: DC | PRN

## 2023-02-24 NOTE — ED Notes (Signed)
Patient responded to speech. This nurse called patient name and he responded "what". Pt. Also responded to wife.

## 2023-02-24 NOTE — ED Provider Notes (Signed)
[place] EMERGENCY DEPARTMENT AT [facility] Provider Note   CSN: [id number] Arrival date & time: 02/24/23  1717     History  Chief Complaint  Patient presents with   Loss of Consciousness   Hypotension    David [name] is a 67 y.o. male.  Patient here with altered mental status.  Brought here by EMS.  Level 5 caveat as patient is altered.  Blood sugar normal with EMS.  EMS states that wife had seen him normal 2 hours prior.  Wife saw patient on the ring doorbell camera laying on the floor on the front porch and called his son to check on him who lives next-door.  Patient was unresponsive.  Supposedly history of seizure 1 time about a year and a half ago.  History of alcohol use but supposedly has not had a drink in 20 days.  Also with some mental health issues and on multiple medications for that.  Per EMS it did not appear that there were any substances or alcohol pill bottles around at the scene.  Patient did have low blood pressure given 500 cc of fluid.  Per family patient was at his normal state of health about 2 hours ago.  No obvious evidence of trauma on exam by EMS.  The history is provided by the patient.       Home Medications Prior to Admission medications   Medication Sig Start Date End Date Taking? Authorizing Provider  FLUoxetine (PROZAC) 20 MG capsule Take 2 capsules (40 mg total) by mouth daily. 11/05/22  Yes Cottle, Steva Ready., MD  lamoTRIgine (LAMICTAL) 100 MG tablet Take 1 tablet (100 mg total) by mouth 2 (two) times daily. Appointment needed for further refills 01/26/23  Yes Levert Feinstein, MD  modafinil (PROVIGIL) 200 MG tablet TAKE 1 TABLET BY MOUTH DAILY Patient taking differently: Take 200 mg by mouth daily. 02/10/23  Yes Cottle, Steva Ready., MD  ondansetron (ZOFRAN) 8 MG tablet TAKE 1 TABLET(8 MG) BY MOUTH EVERY 8 HOURS AS NEEDED FOR NAUSEA OR VOMITING Patient taking differently: Take 8 mg by mouth every 8 (eight) hours as needed for nausea or  vomiting. 01/27/23  Yes Cottle, Steva Ready., MD  pregabalin (LYRICA) 150 MG capsule TAKE 1 CAPSULE(150 MG) BY MOUTH TWICE DAILY Patient taking differently: Take 150 mg by mouth 2 (two) times daily. 01/05/23  Yes Cottle, Steva Ready., MD  QUEtiapine (SEROQUEL) 200 MG tablet TAKE 3 TABLETS(600 MG) BY MOUTH AT BEDTIME Patient taking differently: Take 400-600 mg by mouth at bedtime. 11/05/22  Yes Cottle, Steva Ready., MD  RINVOQ 15 MG TB24 Take 15 mg by mouth daily.   Yes [provider]  zolpidem (AMBIEN) 10 MG tablet TAKE 1 TO 1 AND 1/2 TABLETS BY MOUTH AT NIGHT AS NEEDED FOR SLEEP Patient taking differently: Take 10-15 mg by mouth at bedtime. 01/05/23  Yes Cottle, Steva Ready., MD  cetaphil (CETAPHIL) lotion Apply 1 application topically as needed for dry skin.     [provider]  cloNIDine (CATAPRES) 0.2 MG tablet TAKE 1 TABLET(0.2 MG) BY MOUTH TWICE DAILY Patient taking differently: Take 0.2 mg by mouth 2 (two) times daily. 01/05/23   Cottle, Steva Ready., MD  hydrOXYzine (ATARAX) 25 MG tablet TAKE 1 TABLET BY MOUTH EVERY 6 HOURS AS NEEDED FOR ITCHING OR ANXIETY Patient taking differently: Take 25 mg by mouth every 6 (six) hours as needed for itching or anxiety. 01/27/23   Meredith Staggers  Threasa Alpha., MD  metoprolol succinate (TOPROL-XL) 100 MG 24 hr tablet TAKE 1 TABLET BY MOUTH EVERY DAY 12/02/21   Cottle, Steva Ready., MD      Allergies    Other and Codeine    Review of Systems   Review of Systems  Physical Exam Updated Vital Signs BP (!) 155/98   Pulse 65   Temp 97.6 F (36.4 C) (Rectal)   Resp (!) 22   Ht 5\' 8"  (1.727 m)   Wt 87.7 kg   SpO2 100%   BMI 29.40 kg/m  Physical Exam Vitals and nursing note reviewed.  Constitutional:      General: He is not in acute distress.    Appearance: He is well-developed. He is ill-appearing.     Comments: Patient breathing on his own, responding to painful stimuli, appears to move all extremities to painful stimuli  HENT:     Head:      Comments: Small abrasion over the right eyebrow    Nose: Nose normal.  Eyes:     Conjunctiva/sclera: Conjunctivae normal.     Comments: Pupils are dilated and equal bilaterally and reactive to light  Cardiovascular:     Rate and Rhythm: Regular rhythm. Bradycardia present.     Pulses: Normal pulses.     Heart sounds: No murmur heard. Pulmonary:     Effort: Pulmonary effort is normal. No respiratory distress.     Breath sounds: Normal breath sounds.  Abdominal:     Palpations: Abdomen is soft.     Tenderness: There is no abdominal tenderness.  Musculoskeletal:        General: No swelling.     Cervical back: Neck supple.  Skin:    General: Skin is warm and dry.     Capillary Refill: Capillary refill takes less than 2 seconds.  Neurological:     GCS: GCS eye subscore is 2. GCS verbal subscore is 2. GCS motor subscore is 5.     Comments: Patient mumbles, withdraws to painful stimuli, opens eyes to pain  Psychiatric:        Mood and Affect: Mood normal.     ED Results / Procedures / Treatments   Labs (all labs ordered are listed, but only abnormal results are displayed) Labs Reviewed  COMPREHENSIVE METABOLIC PANEL - Abnormal; Notable for the following components:      Result Value   Sodium 125 (*)    Chloride 97 (*)    Glucose, Bld 122 (*)    Calcium 7.7 (*)    Total Protein 6.3 (*)    Albumin 3.0 (*)    All other components within normal limits  CBC WITH DIFFERENTIAL/PLATELET - Abnormal; Notable for the following components:   RBC 2.93 (*)    Hemoglobin 8.1 (*)    HCT 24.3 (*)    All other components within normal limits  AMMONIA - Abnormal; Notable for the following components:   Ammonia 39 (*)    All other components within normal limits  BLOOD GAS, VENOUS - Abnormal; Notable for the following components:   pO2, Ven 56 (*)    Acid-base deficit 2.5 (*)    All other components within normal limits  ACETAMINOPHEN LEVEL - Abnormal; Notable for the following components:    Acetaminophen (Tylenol), Serum <10 (*)    All other components within normal limits  SALICYLATE LEVEL - Abnormal; Notable for the following components:   Salicylate Lvl <7.0 (*)    All other components within normal  limits  CBG MONITORING, ED - Abnormal; Notable for the following components:   Glucose-Capillary 115 (*)    All other components within normal limits  I-STAT CHEM 8, ED - Abnormal; Notable for the following components:   Sodium 127 (*)    Chloride 94 (*)    Glucose, Bld 120 (*)    Calcium, Ion 1.07 (*)    Hemoglobin 8.5 (*)    HCT 25.0 (*)    All other components within normal limits  POC OCCULT BLOOD, ED - Abnormal; Notable for the following components:   Fecal Occult Bld POSITIVE (*)    All other components within normal limits  CULTURE, BLOOD (ROUTINE X 2)  CULTURE, BLOOD (ROUTINE X 2)  LACTIC ACID, PLASMA  ETHANOL  URINALYSIS, ROUTINE W REFLEX MICROSCOPIC  RAPID URINE DRUG SCREEN, HOSP PERFORMED  HIV ANTIBODY (ROUTINE TESTING W REFLEX)  POC OCCULT BLOOD, ED    EKG EKG Interpretation  Date/Time:  Tuesday February 24 2023 17:21:45 EDT Ventricular Rate:  54 PR Interval:  184 QRS Duration: 101 QT Interval:  443 QTC Calculation: 420 R Axis:   77 Text Interpretation: Sinus rhythm Confirmed by Virgina Norfolk ([id number]) on 02/24/2023 5:39:33 PM  Radiology CT CERVICAL SPINE WO CONTRAST  Result Date: 02/24/2023 CLINICAL DATA:  Unwitnessed fall EXAM: CT CERVICAL SPINE WITHOUT CONTRAST TECHNIQUE: Multidetector CT imaging of the cervical spine was performed without intravenous contrast. Multiplanar CT image reconstructions were also generated. RADIATION DOSE REDUCTION: This exam was performed according to the departmental dose-optimization program which includes automated exposure control, adjustment of the mA and/or kV according to patient size and/or use of iterative reconstruction technique. COMPARISON:  None Available. FINDINGS: Alignment: 3-4 mm anterolisthesis C3-4 and  C4-5 is degenerative in nature. Mild reversal of the normal cervical lordosis. Skull base and vertebrae: Imaging is slightly limited by motion artifact. Craniocervical alignment is normal. The atlantodental interval is not widened. No acute fracture of the cervical spine. Vertebral body height is preserved. There is ankylosis of the vertebral bodies and facet joints of C2-C4. Soft tissues and spinal canal: No prevertebral fluid or swelling. No visible canal hematoma. Disc levels: There is disc space narrowing and endplate remodeling throughout the cervical spine, most severe at C5-C7 in keeping with changes of advanced degenerative disc disease. No high-grade canal stenosis hypertrophic arthropathy results in high-grade neuroforaminal narrowing bilaterally at C3-4 and C4-5. Upper chest: Excluded Other: None IMPRESSION: 1. No acute fracture or listhesis of the cervical spine. 2. 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 Electronically Signed   By: Helyn Numbers M.D.   On: 02/24/2023 19:31   CT HEAD WO CONTRAST  Result Date: 02/24/2023 CLINICAL DATA:  Blunt trauma. EXAM: CT HEAD WITHOUT CONTRAST TECHNIQUE: Contiguous axial images were obtained from the base of the skull through the vertex without intravenous contrast. RADIATION DOSE REDUCTION: This exam was performed according to the departmental dose-optimization program which includes automated exposure control, adjustment of the mA and/or kV according to patient size and/or use of iterative reconstruction technique. COMPARISON:  Head CT 09/23/2021 FINDINGS: Brain: No intracranial hemorrhage, mass effect, or midline shift. No hydrocephalus. The basilar cisterns are patent. No evidence of territorial infarct or acute ischemia. No extra-axial or intracranial fluid collection. Vascular: Atherosclerosis of skullbase vasculature without hyperdense vessel or  abnormal calcification. Skull: No fracture or focal lesion. Sinuses/Orbits: No acute finding. Other: No significant scalp hematoma. IMPRESSION: No acute intracranial abnormality. No skull fracture.  Electronically Signed   By: Narda Rutherford M.D.   On: 02/24/2023 19:21   DG Chest Portable 1 View  Result Date: 02/24/2023 CLINICAL DATA:  Altered mental status EXAM: PORTABLE CHEST 1 VIEW COMPARISON:  Chest radiograph dated 05/25/2020 FINDINGS: Low lung volumes with bronchovascular crowding. Bibasilar patchy and right basilar linear opacities. No pleural effusion or pneumothorax. The heart size and mediastinal contours are within normal limits. No acute osseous abnormality. IMPRESSION: Low lung volumes with bibasilar patchy and right basilar linear opacities, likely atelectasis. Aspiration or pneumonia can be considered in the appropriate clinical setting. Electronically Signed   By: Agustin Cree M.D.   On: 02/24/2023 19:18    Procedures Procedures    Medications Ordered in ED Medications  sodium chloride 0.9 % bolus 1,000 mL (1,000 mLs Intravenous New Bag/Given 02/24/23 1748)    And  0.9 %  sodium chloride infusion ( Intravenous New Bag/Given 02/24/23 1947)  lamoTRIgine (LAMICTAL) tablet 100 mg (has no administration in time range)  naloxone [facility]) injection 0.4 mg (0.4 mg Intravenous Not Given 02/24/23 1834)    ED Course/ Medical Decision Making/ A&P                             Medical Decision Making Amount and/or Complexity of Data Reviewed Labs: ordered. Radiology: ordered.  Risk Prescription drug management. Decision regarding hospitalization.   RODELL MARRS is here after episode of loss of consciousness.  Arrives hypotensive but blood pressure normalized after IV fluids.  Vital signs otherwise unremarkable.  GCS 9.  He responds to noxious stimuli.  Questionable history of seizures, alcohol abuse, mental health issues.  Was seen 2 hours ago normal and then wife found him lying on  the floor on his porch by a ring camera.  EMS arrived patient was somnolent but would withdraw to noxious stimuli.  Blood sugar normal.  Narcan with no effect.  When I used painful stimuli he moves all extremities, he opens his eyes.  Has not had alcohol in 3 weeks per family.  Has not been depressed or ill recently.  Differential diagnosis likely postictal state from seizure, polypharmacy.  Have no concern for infectious process.  Broad workup initiated with CBC, CMP, ethanol, head CT, neck CT, chest x-ray, urinalysis, ethanol level, Tylenol level, salicylate level.  EKG shows sinus rhythm.  No ischemic changes.  Per my review and interpretation of labs sodium is 125 but otherwise lab works unremarkable.  No white count.  No lactic acidosis.  Chest x-ray no obvious pneumonia.  Had CT and neck CT per radiology report unremarkable.  He has been here couple hours on multiple reevaluations he seems to be slowly improving.  He can now answer simple questions but still fairly encephalopathic.  My suspicion is that this is polypharmacy.  I talked with Dr. Amada Jupiter with neurology and overall recommend admission.  No change in seizure meds at this time as hard to know if he truly has seizure disorder.  He seems to be improving I have no concern for status epilepticus at this time.  Vital signs normal at time of admission.  This chart was dictated using voice recognition software.  Despite best efforts to proofread,  errors can occur which can change the documentation meaning.         Final Clinical Impression(s) / ED Diagnoses Final diagnoses:  Encephalopathy    Rx / DC Orders ED Discharge Orders     None  Virgina Norfolk, DO 02/24/23 2059

## 2023-02-24 NOTE — ED Triage Notes (Signed)
BIBA. Wife saw patient on the ring door bell camera laying on the floor of the front porch. She called his son to check on him. Per medics patient was unresponsive when they arrived. Sz hx, last one was reported a year and half ago. Patient also has Hx of ETOH. Son states last 20 days he has not had anything to drink. No evidence or alcohol or meds on scene per EMS, last seen normal was two hours ago by patients wife. No obvious injuries or trauma noted on arrival. Patient arrived with 18g to right [facility], medic gave 500cc of NS due to patient hypotensive on arrival

## 2023-02-24 NOTE — ED Notes (Signed)
Pt family member took belonging bag home with her.

## 2023-02-24 NOTE — ED Notes (Signed)
ED Provider at bedside. 

## 2023-02-24 NOTE — H&P (Signed)
History and Physical  David [name] David [name] DOB: 1956/06/14 DOA: 02/24/2023  Referring physician: Dr. Lockie Mola, EDP  PCP: Pcp, No  Outpatient Specialists: Behavioral health Patient coming from: Home.  Chief Complaint: Unresponsiveness  HPI: David [name] is a 67 y.o. male with medical history significant for hypertension, history of heavy alcohol use (none in the past 3 weeks), seizure disorder on Lamictal (Had 1 seizure last year that they are aware of), chronic anxiety/depression, who presented to [facility] ED from home after an episode of unresponsiveness.  No prior history of syncope or unresponsiveness.  The patient was found unresponsive outside of his home.  His wife saw him on his porch unresponsive through a ring camera.  The patient was at his normal state of health prior to this just 2 hours prior.  EMS was activated.  Upon EMS arrival, the patient was unresponsive and hypotensive, received 500 cc IV fluid bolus NS by EMS.  No evidence of alcohol or medications on scene.  No obvious injuries or trauma.  The patient was brought into the ED for further evaluation.  In the ED, encephalopathic and responding to painful stimuli which improved to now responding to verbal cues.  EDP discussed the case with neurology/stroke team on-call who recommended admission for observation and to obtain an EEG if his mentation does not improve, no need to transfer to [facility].  Workup in the ED revealed non acute CT head and cervical spine.  Lab studies were notable for anemia with concern for acute blood loss anemia with positive FOBT.  TRH, hospitalist service, was asked to admit.  ED Course: Temperature 97.6.  BP 165/94, pulse 72, respiratory 15, saturation 100% on room air.  Lab studies remarkable for serum sodium 127, glucose 120, WBC 9.7, hemoglobin 8 point 1 repeat 8.5, platelet 274.  UA is pending.  COVID-19 screening test was not performed.  Review of Systems: Review of systems as  noted in the HPI. All other systems reviewed and are negative.   Past Medical History:  Diagnosis Date   ADD (attention deficit disorder)    Anemia    Microcytic   Arthritis    Bipolar disorder (HCC)    Depression    Dyspnea    history of no current issues 05/26/2019   GAD (generalized anxiety disorder)    History of kidney stones    passed   HTN (hypertension)    PTSD (post-traumatic stress disorder)    Skin cancer    Past Surgical History:  Procedure Laterality Date   BACK SURGERY     Fusion - lumbar   HIP PINNING,CANNULATED Right 06/24/2018   Procedure: RIGHT CANNULATED HIP PINNING;  Surgeon: Terance Hart, MD;  Location: [facility] ORS;  Service: Orthopedics;  Laterality: Right;   LITHOTRIPSY     MULTIPLE EXTRACTIONS WITH ALVEOLOPLASTY Bilateral 05/03/2019   Procedure: MULTIPLE EXTRACTION TEETH NUMBER TWO, THREE, FIVE, SIX, SEVEN, EIGHT, NINE, TEN, ELEVEN, FIFTEEN, SIXTEEN, SEVENTEEN, NINETEEN, TWENTY, TWENTY-ONE, TWENTY-TWO, TWENTY-THREE, TWENTY-FOUR, TWENTY-FIVE, TWENTY-SIX, TWENTY-SEVEN, TWENTY-EIGHT, TWENTY-NINE, THIRTY-TWO WITH ALVEOLOPLASTY;  Surgeon: Ocie Doyne, DDS;  Location: [facility] OR;  Service: Oral Surgery;  Laterality: Bilateral;   NOSE SURGERY     x3   TOTAL HIP ARTHROPLASTY Left 05/27/2019   Procedure: TOTAL HIP ARTHROPLASTY ANTERIOR APPROACH;  Surgeon: Jodi Geralds, MD;  Location: [facility] ORS;  Service: Orthopedics;  Laterality: Left;    Social History:  reports that he has been smoking cigarettes. He has a 40.00 pack-year smoking history. He has never used  smokeless tobacco. He reports current alcohol use. He reports that he does not currently use drugs after having used the following drugs: Marijuana.   Allergies  Allergen Reactions   Other Other (See Comments)    Has eczema, so no strong detergents or dryer sheets   Codeine Nausea Only    Family History  Problem Relation Age of Onset   Arthritis Other    Heart disease Other    Cancer Other     Hypertension Other       Prior to Admission medications   Medication Sig Start Date End Date Taking? Authorizing Provider  FLUoxetine (PROZAC) 20 MG capsule Take 2 capsules (40 mg total) by mouth daily. 11/05/22  Yes Cottle, Steva Ready., MD  lamoTRIgine (LAMICTAL) 100 MG tablet Take 1 tablet (100 mg total) by mouth 2 (two) times daily. Appointment needed for further refills 01/26/23  Yes Levert Feinstein, MD  modafinil (PROVIGIL) 200 MG tablet TAKE 1 TABLET BY MOUTH DAILY Patient taking differently: Take 200 mg by mouth daily. 02/10/23  Yes Cottle, Steva Ready., MD  ondansetron (ZOFRAN) 8 MG tablet TAKE 1 TABLET(8 MG) BY MOUTH EVERY 8 HOURS AS NEEDED FOR NAUSEA OR VOMITING Patient taking differently: Take 8 mg by mouth every 8 (eight) hours as needed for nausea or vomiting. 01/27/23  Yes Cottle, Steva Ready., MD  pregabalin (LYRICA) 150 MG capsule TAKE 1 CAPSULE(150 MG) BY MOUTH TWICE DAILY Patient taking differently: Take 150 mg by mouth 2 (two) times daily. 01/05/23  Yes Cottle, Steva Ready., MD  QUEtiapine (SEROQUEL) 200 MG tablet TAKE 3 TABLETS(600 MG) BY MOUTH AT BEDTIME Patient taking differently: Take 400-600 mg by mouth at bedtime. 11/05/22  Yes Cottle, Steva Ready., MD  RINVOQ 15 MG TB24 Take 15 mg by mouth daily.   Yes [provider]  zolpidem (AMBIEN) 10 MG tablet TAKE 1 TO 1 AND 1/2 TABLETS BY MOUTH AT NIGHT AS NEEDED FOR SLEEP Patient taking differently: Take 10-15 mg by mouth at bedtime. 01/05/23  Yes Cottle, Steva Ready., MD  cetaphil (CETAPHIL) lotion Apply 1 application topically as needed for dry skin.     [provider]  cloNIDine (CATAPRES) 0.2 MG tablet TAKE 1 TABLET(0.2 MG) BY MOUTH TWICE DAILY Patient taking differently: Take 0.2 mg by mouth 2 (two) times daily. 01/05/23   Cottle, Steva Ready., MD  hydrOXYzine (ATARAX) 25 MG tablet TAKE 1 TABLET BY MOUTH EVERY 6 HOURS AS NEEDED FOR ITCHING OR ANXIETY Patient taking differently: Take 25 mg by mouth every 6 (six) hours as  needed for itching or anxiety. 01/27/23   Cottle, Steva Ready., MD  metoprolol succinate (TOPROL-XL) 100 MG 24 hr tablet TAKE 1 TABLET BY MOUTH EVERY DAY 12/02/21   Cottle, Steva Ready., MD    Physical Exam: BP (!) 155/98   Pulse 65   Temp 97.6 F (36.4 C) (Rectal)   Resp (!) 22   Ht 5\' 8"  (1.727 m)   Wt 87.7 kg   SpO2 100%   BMI 29.40 kg/m   General: 67 y.o. year-old male well developed well nourished in no acute distress.  Hypersomnolent. Cardiovascular: Regular rate and rhythm with no rubs or gallops.  No thyromegaly or JVD noted.  No lower extremity edema. 2/4 pulses in all 4 extremities. Respiratory: Clear to auscultation with no wheezes or rales.  Poor inspiratory effort. Abdomen: Soft nontender nondistended with normal bowel sounds x4 quadrants. Muskuloskeletal: No cyanosis, clubbing or edema noted  bilaterally Neuro: CN II-XII intact, strength, sensation, reflexes Skin: No ulcerative lesions noted or rashes Psychiatry: Mood is appropriate for condition and setting          Labs on Admission:  Basic Metabolic Panel: Recent Labs  Lab 02/24/23 1739 02/24/23 1747  NA 125* 127*  K 3.9 4.3  CL 97* 94*  CO2 23  --   GLUCOSE 122* 120*  BUN 10 9  CREATININE 1.12 1.10  CALCIUM 7.7*  --    Liver Function Tests: Recent Labs  Lab 02/24/23 1739  AST 19  ALT 12  ALKPHOS 72  BILITOT 0.6  PROT 6.3*  ALBUMIN 3.0*   No results for input(s): "LIPASE", "AMYLASE" in the last 168 hours. Recent Labs  Lab 02/24/23 1739  AMMONIA 39*   CBC: Recent Labs  Lab 02/24/23 1739 02/24/23 1747  WBC 9.7  --   NEUTROABS 7.2  --   HGB 8.1* 8.5*  HCT 24.3* 25.0*  MCV 82.9  --   PLT 274  --    Cardiac Enzymes: No results for input(s): "CKTOTAL", "CKMB", "CKMBINDEX", "TROPONINI" in the last 168 hours.  BNP (last 3 results) No results for input(s): "BNP" in the last 8760 hours.  ProBNP (last 3 results) No results for input(s): "PROBNP" in the last 8760 hours.  CBG: Recent  Labs  Lab 02/24/23 1720  GLUCAP 115*    Radiological Exams on Admission: CT CERVICAL SPINE WO CONTRAST  Result Date: 02/24/2023 CLINICAL DATA:  Unwitnessed fall EXAM: CT CERVICAL SPINE WITHOUT CONTRAST TECHNIQUE: Multidetector CT imaging of the cervical spine was performed without intravenous contrast. Multiplanar CT image reconstructions were also generated. RADIATION DOSE REDUCTION: This exam was performed according to the departmental dose-optimization program which includes automated exposure control, adjustment of the mA and/or kV according to patient size and/or use of iterative reconstruction technique. COMPARISON:  None Available. FINDINGS: Alignment: 3-4 mm anterolisthesis C3-4 and C4-5 is degenerative in nature. Mild reversal of the normal cervical lordosis. Skull base and vertebrae: Imaging is slightly limited by motion artifact. Craniocervical alignment is normal. The atlantodental interval is not widened. No acute fracture of the cervical spine. Vertebral body height is preserved. There is ankylosis of the vertebral bodies and facet joints of C2-C4. Soft tissues and spinal canal: No prevertebral fluid or swelling. No visible canal hematoma. Disc levels: There is disc space narrowing and endplate remodeling throughout the cervical spine, most severe at C5-C7 in keeping with changes of advanced degenerative disc disease. No high-grade canal stenosis hypertrophic arthropathy results in high-grade neuroforaminal narrowing bilaterally at C3-4 and C4-5. Upper chest: Excluded Other: None IMPRESSION: 1. No acute fracture or listhesis of the cervical spine. 2. 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 Electronically Signed   By: Helyn Numbers M.D.   On: 02/24/2023 19:31   CT HEAD WO CONTRAST  Result Date: 02/24/2023 CLINICAL DATA:  Blunt trauma. EXAM: CT HEAD WITHOUT CONTRAST TECHNIQUE:  Contiguous axial images were obtained from the base of the skull through the vertex without intravenous contrast. RADIATION DOSE REDUCTION: This exam was performed according to the departmental dose-optimization program which includes automated exposure control, adjustment of the mA and/or kV according to patient size and/or use of iterative reconstruction technique. COMPARISON:  Head CT 09/23/2021 FINDINGS: Brain: No intracranial hemorrhage, mass effect, or midline shift. No hydrocephalus. The basilar cisterns are patent. No evidence of territorial infarct or acute ischemia. No extra-axial or intracranial fluid collection.  Vascular: Atherosclerosis of skullbase vasculature without hyperdense vessel or abnormal calcification. Skull: No fracture or focal lesion. Sinuses/Orbits: No acute finding. Other: No significant scalp hematoma. IMPRESSION: No acute intracranial abnormality. No skull fracture. Electronically Signed   By: Narda Rutherford M.D.   On: 02/24/2023 19:21   DG Chest Portable 1 View  Result Date: 02/24/2023 CLINICAL DATA:  Altered mental status EXAM: PORTABLE CHEST 1 VIEW COMPARISON:  Chest radiograph dated 05/25/2020 FINDINGS: Low lung volumes with bronchovascular crowding. Bibasilar patchy and right basilar linear opacities. No pleural effusion or pneumothorax. The heart size and mediastinal contours are within normal limits. No acute osseous abnormality. IMPRESSION: Low lung volumes with bibasilar patchy and right basilar linear opacities, likely atelectasis. Aspiration or pneumonia can be considered in the appropriate clinical setting. Electronically Signed   By: Agustin Cree M.D.   On: 02/24/2023 19:18    EKG: I independently viewed the EKG done and my findings are as followed: Sinus rhythm rate of 54.  Nonspecific ST-T changes.  QTc 420.  Assessment/Plan Present on Admission:  Unresponsive episode  Principal Problem:   Unresponsive episode  Unresponsiveness, unclear etiology Syncope,  unclear etiology. Obtain orthostatic vital signs Obtain 2D echo. Monitor on telemetry Continue gentle IV fluid hydration  Acute metabolic encephalopathy, rule out postictal versus polypharmacy versus others Hypersomnolent Obtain VBG to assess pH and pCO2 levels Obtain CBG to rule out hypoglycemia Follow EEG Aspiration and fall precautions in place  Acute blood loss anemia No reported overt bleeding, hemoglobin 8.1 on presentation with positive FOBT Baseline hemoglobin 12 Obtain iron studies Repeat CBC, transfuse hemoglobin less than 7. The patient will need to follow-up with GI outpatient for further workup.  Hypovolemic hyponatremia Serum sodium 127 Repleted with NS at 75 cc/h x 1 day Repeat BMP to reassess serum sodium level after IV fluid normal saline.  Seizure disorder Resume home regimen Seizure precautions and fall precautions in place IV Ativan as needed for breakthrough seizures.  History of alcohol abuse No alcoholic use for the past 3 weeks Multivitamins, folic acid and thiamine supplements    DVT prophylaxis: SCDs  Code Status: Full code  Family Communication: Updated the patient's wife at bedside  Disposition Plan: Admitted to telemetry unit  Consults called: EDP curb sided with neurology.  Admission status: Observation status.   Status is: Observation    Darlin Drop MD [facility] Pager [phone]  If 7PM-7AM, please contact night-coverage [email or web address] Password [email or web address]  02/24/2023, 9:00 PM

## 2023-02-25 ENCOUNTER — Observation Stay (HOSPITAL_BASED_OUTPATIENT_CLINIC_OR_DEPARTMENT_OTHER): Payer: Medicare HMO

## 2023-02-25 ENCOUNTER — Other Ambulatory Visit: Payer: Self-pay

## 2023-02-25 DIAGNOSIS — R55 Syncope and collapse: Secondary | ICD-10-CM

## 2023-02-25 DIAGNOSIS — J9601 Acute respiratory failure with hypoxia: Secondary | ICD-10-CM | POA: Diagnosis not present

## 2023-02-25 DIAGNOSIS — I10 Essential (primary) hypertension: Secondary | ICD-10-CM | POA: Diagnosis not present

## 2023-02-25 DIAGNOSIS — R404 Transient alteration of awareness: Secondary | ICD-10-CM

## 2023-02-25 DIAGNOSIS — D500 Iron deficiency anemia secondary to blood loss (chronic): Secondary | ICD-10-CM | POA: Diagnosis not present

## 2023-02-25 LAB — IRON AND TIBC
Iron: 28 ug/dL — ABNORMAL LOW (ref 45–182)
Saturation Ratios: 7 % — ABNORMAL LOW (ref 17.9–39.5)
TIBC: 389 ug/dL (ref 250–450)
UIBC: 361 ug/dL

## 2023-02-25 LAB — ECHOCARDIOGRAM COMPLETE
AR max vel: 2.92 cm2
AV Peak grad: 12.4 mmHg
Ao pk vel: 1.76 m/s
Area-P 1/2: 3.77 cm2
Height: 68 in
MV M vel: 4.33 m/s
MV Peak grad: 75 mmHg
S' Lateral: 2.7 cm
Weight: 2948.87 oz

## 2023-02-25 LAB — CBC
HCT: 26.8 % — ABNORMAL LOW (ref 39.0–52.0)
Hemoglobin: 8.5 g/dL — ABNORMAL LOW (ref 13.0–17.0)
MCH: 26.8 pg (ref 26.0–34.0)
MCHC: 31.7 g/dL (ref 30.0–36.0)
MCV: 84.5 fL (ref 80.0–100.0)
Platelets: 283 10*3/uL (ref 150–400)
RBC: 3.17 MIL/uL — ABNORMAL LOW (ref 4.22–5.81)
RDW: 14.7 % (ref 11.5–15.5)
WBC: 7.8 10*3/uL (ref 4.0–10.5)
nRBC: 0 % (ref 0.0–0.2)

## 2023-02-25 LAB — GLUCOSE, CAPILLARY: Glucose-Capillary: 96 mg/dL (ref 70–99)

## 2023-02-25 LAB — BASIC METABOLIC PANEL
Anion gap: 6 (ref 5–15)
BUN: 8 mg/dL (ref 8–23)
CO2: 24 mmol/L (ref 22–32)
Calcium: 8 mg/dL — ABNORMAL LOW (ref 8.9–10.3)
Chloride: 101 mmol/L (ref 98–111)
Creatinine, Ser: 0.97 mg/dL (ref 0.61–1.24)
GFR, Estimated: >60 mL/min (ref >60)
Glucose, Bld: 101 mg/dL — ABNORMAL HIGH (ref 70–99)
Potassium: 3.8 mmol/L (ref 3.5–5.1)
Sodium: 131 mmol/L — ABNORMAL LOW (ref 135–145)

## 2023-02-25 LAB — BLOOD GAS, VENOUS
Acid-base deficit: 2.1 mmol/L — ABNORMAL HIGH (ref 0.0–2.0)
Bicarbonate: 26.3 mmol/L (ref 20.0–28.0)
O2 Saturation: 30.2 %
Patient temperature: 37
pCO2, Ven: 60 mmHg (ref 44–60)
pH, Ven: 7.25 (ref 7.25–7.43)
pO2, Ven: <31 mmHg — CL (ref 32–45)

## 2023-02-25 LAB — MAGNESIUM: Magnesium: 2.1 mg/dL (ref 1.7–2.4)

## 2023-02-25 LAB — MRSA NEXT GEN BY PCR, NASAL: MRSA by PCR Next Gen: NOT DETECTED

## 2023-02-25 LAB — PHOSPHORUS: Phosphorus: 3.5 mg/dL (ref 2.5–4.6)

## 2023-02-25 LAB — HIV ANTIBODY (ROUTINE TESTING W REFLEX): HIV Screen 4th Generation wRfx: NONREACTIVE

## 2023-02-25 LAB — FERRITIN: Ferritin: 31 ng/mL (ref 24–336)

## 2023-02-25 MED ORDER — SODIUM CHLORIDE 0.9 % IV BOLUS
500.0000 mL | INTRAVENOUS | Status: AC
  Administered 2023-02-25: 500 mL via INTRAVENOUS

## 2023-02-25 MED ORDER — ORAL CARE MOUTH RINSE: 15.0000 mL | OROMUCOSAL | Status: DC

## 2023-02-25 MED ORDER — CHLORHEXIDINE GLUCONATE CLOTH 2 % EX PADS: 6.0000 | MEDICATED_PAD | Freq: Every day | CUTANEOUS | Status: DC

## 2023-02-25 MED ORDER — ORAL CARE MOUTH RINSE: 15.0000 mL | OROMUCOSAL | Status: DC | PRN

## 2023-02-25 MED ORDER — HYDRALAZINE HCL 20 MG/ML IJ SOLN
5.0000 mg | Freq: Four times a day (QID) | INTRAMUSCULAR | Status: DC | PRN
  Filled 2023-02-25: qty 1

## 2023-02-25 NOTE — TOC Transition Note (Signed)
Transition of Care [facility]) - CM/SW Discharge Note   Patient Details  Name: David [name] MRN: [id number] Date of Birth: 06/26/1956  [facility] CM/SW Contact:  Lavenia Atlas, RN Phone Number: 02/25/2023, 12:17 PM   Clinical Narrative:   Patient from home with spouse, being discharged from [facility] SDU. This RNCM spoke with patient's wife David [name] regarding PCP needs, encouraged David [name] to contact phone number on insurance card to establish PCP care and can call this RNCM if she needs any additional assistance. No current TOC needs, signing off.   Transportation at discharge: wife   Final next level of care: Home/Self Care Barriers to Discharge: No Barriers Identified   Patient Goals and CMS Choice CMS Medicare.gov Compare Post Acute Care list provided to:: Patient Represenative (must comment) Choice offered to / list presented to : Spouse  Discharge Placement                    Name of family member notified: wife Health visitor    Discharge Plan and Services Additional resources added to the After Visit Summary for                  DME Arranged: N/A DME Agency: NA       HH Arranged: NA HH Agency: NA        Social Determinants of Health (SDOH) Interventions SDOH Screenings   Food Insecurity: Patient Unable To Answer (02/24/2023)  Housing: High Risk (02/24/2023)  Transportation Needs: Patient Unable To Answer (02/24/2023)  Utilities: Patient Unable To Answer (02/24/2023)  Financial Resource Strain: Medium Risk (06/23/2018)  Physical Activity: Insufficiently Active (06/23/2018)  Social Connections: Moderately Integrated (06/23/2018)  Stress: Unknown (06/23/2018)  Tobacco Use: High Risk (02/24/2023)     Readmission Risk Interventions     No data to display

## 2023-02-25 NOTE — Progress Notes (Signed)
Echocardiogram 2D Echocardiogram has been performed.  David [name] 02/25/2023, 10:38 AM

## 2023-02-25 NOTE — Progress Notes (Signed)
   02/25/23 0236  BiPAP/CPAP/SIPAP  $ Non-Invasive Ventilator  Non-Invasive Vent Initial  $ Face Mask Medium Yes  BiPAP/CPAP/SIPAP Pt Type Adult  BiPAP/CPAP/SIPAP V60  Mask Type Full face mask  Mask Size Medium  Set Rate 14 breaths/min  Respiratory Rate 15 breaths/min  IPAP 14 cmH20  EPAP 6 cmH2O  FiO2 (%) 30 %  Minute Ventilation 8.1  Leak 41  Peak Inspiratory Pressure (PIP) 14  Tidal Volume (Vt) 559  Patient Home Equipment No  Auto Titrate No  Press High Alarm 25 cmH2O  Press Low Alarm 5 cmH2O  CPAP/SIPAP surface wiped down Yes  BiPAP/CPAP /SiPAP Vitals  Pulse Rate (!) 57  Resp 15  BP (!) 160/72  SpO2 100 %  Bilateral Breath Sounds Diminished

## 2023-02-25 NOTE — Progress Notes (Addendum)
The patient was transferred to stepdown unit due to worsening acute respiratory acidosis with pH of 7.25 from 7.31 approximately 5 hours ago, pCO2 60 from 48.  Serum bicarb 23 around 1700.  Stat BiPAP ordered.  Strict n.p.o. while on BiPAP.  Time: 15 minutes.

## 2023-02-25 NOTE — Discharge Summary (Signed)
Physician Discharge Summary  [name]:[id number] DOB: [birth year] DOA: 02/24/2023  PCP: Pcp, No  Admit date: 02/24/2023 Discharge date: 02/25/2023  Admitted From: Home Disposition: Home  Recommendations for Outpatient Follow-up:  Follow up with hatchery in the next week to discuss ongoing medical regimen/Medication changes:  Home Health: None Equipment/Devices: None  Discharge Condition: Stable CODE STATUS: Full Diet recommendation: Regular diet  Brief/Interim Summary: David [name] is a 67 y.o. male with medical history significant for hypertension, history of heavy alcohol use (none in the past 3 weeks), seizure disorder on Lamictal (Had 1 seizure last year that they are aware of), chronic anxiety/depression, who presented to [facility] ED from home after an episode of unresponsiveness/mental status changes.  Patient returned to baseline overnight, wife at bedside reports patient has had episodes previously of "mixing up his home medications" and has had similar episodes in the past.  We discussed at length steps to take to ensure safe medication administration at home. Family will request pill pack from pharmacy if possible to ensure safe medication administration at home. Wife does keep certain medications that she feels are high risk for confusion, including Seroquel, locked away from the patient.  At this time patient's vitals are within normal limits, no further symptoms of confusion.  We did discuss possibly weaning patient's home medication regiment down, however given his longstanding follow-up with psychiatry will defer to their expertise before making any medication changes.  Discussed with family potentially waiting in the hospital for results of testing including EEG and echo, they are comfortable with discharge prior to completion of these tests given patient's symptoms are consistent with his prior episode of accidental polypharmacy.  Patient had transient episode of hypercarbia  overnight in the setting of altered mental status and polypharmacy.  Patient transiently placed on BiPAP but discontinued first thing this morning without further symptoms.  Discharge Diagnoses:  Principal Problem:   Unresponsive episode  Acute metabolic encephalopathy, resolved Polypharmacy primary concern Unlikely seizure Follow up outpatient for further discussion on EEG/Echo Acute hypoxic/hypercarbic resp failure Chronic anemia Iron deficiency anemia Positive FOBT, likely microscopic given normal BMs - likely from hemorrhoids/similar. No indication for further inpatient workup. Bradycardia, stable chronic, heart rate responds appropriately to activity/exertion Hypovolemic hyponatremia, resolving with PO intake History of alcohol abuse  Discharge Instructions  Discharge Instructions     Discharge patient   Complete by: As directed    Discharge disposition: 01-Home or Self Care   Discharge patient date: 02/25/2023      Allergies as of 02/25/2023       Reactions   Other Other (See Comments)   Has eczema, so no strong detergents or dryer sheets   Codeine Nausea Only        Medication List     TAKE these medications    cetaphil lotion Apply 1 application topically as needed for dry skin.   cloNIDine 0.2 MG tablet Commonly known as: CATAPRES TAKE 1 TABLET(0.2 MG) BY MOUTH TWICE DAILY What changed: See the new instructions.   FLUoxetine 20 MG capsule Commonly known as: PROZAC Take 2 capsules (40 mg total) by mouth daily.   hydrOXYzine 25 MG tablet Commonly known as: ATARAX TAKE 1 TABLET BY MOUTH EVERY 6 HOURS AS NEEDED FOR ITCHING OR ANXIETY What changed: See the new instructions.   lamoTRIgine 100 MG tablet Commonly known as: LAMICTAL Take 1 tablet (100 mg total) by mouth 2 (two) times daily. Appointment needed for further refills   metoprolol succinate 100 MG  24 hr tablet Commonly known as: TOPROL-XL TAKE 1 TABLET BY MOUTH EVERY DAY   modafinil 200 MG  tablet Commonly known as: PROVIGIL TAKE 1 TABLET BY MOUTH DAILY   ondansetron 8 MG tablet Commonly known as: ZOFRAN TAKE 1 TABLET(8 MG) BY MOUTH EVERY 8 HOURS AS NEEDED FOR NAUSEA OR VOMITING What changed: See the new instructions.   pregabalin 150 MG capsule Commonly known as: LYRICA TAKE 1 CAPSULE(150 MG) BY MOUTH TWICE DAILY What changed: See the new instructions.   QUEtiapine 200 MG tablet Commonly known as: SEROQUEL TAKE 3 TABLETS(600 MG) BY MOUTH AT BEDTIME What changed:  how much to take how to take this when to take this additional instructions   Rinvoq 15 MG Tb24 Generic drug: Upadacitinib ER Take 15 mg by mouth daily.   zolpidem 10 MG tablet Commonly known as: AMBIEN TAKE 1 TO 1 AND 1/2 TABLETS BY MOUTH AT NIGHT AS NEEDED FOR SLEEP What changed: See the new instructions.        Allergies  Allergen Reactions   Other Other (See Comments)    Has eczema, so no strong detergents or dryer sheets   Codeine Nausea Only    Consultations: Neuro   Procedures/Studies: CT CERVICAL SPINE WO CONTRAST  Result Date: 02/24/2023 CLINICAL DATA:  Unwitnessed fall EXAM: CT CERVICAL SPINE WITHOUT CONTRAST TECHNIQUE: Multidetector CT imaging of the cervical spine was performed without intravenous contrast. Multiplanar CT image reconstructions were also generated. RADIATION DOSE REDUCTION: This exam was performed according to the departmental dose-optimization program which includes automated exposure control, adjustment of the mA and/or kV according to patient size and/or use of iterative reconstruction technique. COMPARISON:  None Available. FINDINGS: Alignment: 3-4 mm anterolisthesis C3-4 and C4-5 is degenerative in nature. Mild reversal of the normal cervical lordosis. Skull base and vertebrae: Imaging is slightly limited by motion artifact. Craniocervical alignment is normal. The atlantodental interval is not widened. No acute fracture of the cervical spine. Vertebral body  height is preserved. There is ankylosis of the vertebral bodies and facet joints of C2-C4. Soft tissues and spinal canal: No prevertebral fluid or swelling. No visible canal hematoma. Disc levels: There is disc space narrowing and endplate remodeling throughout the cervical spine, most severe at C5-C7 in keeping with changes of advanced degenerative disc disease. No high-grade canal stenosis hypertrophic arthropathy results in high-grade neuroforaminal narrowing bilaterally at C3-4 and C4-5. Upper chest: Excluded Other: None IMPRESSION: 1. No acute fracture or listhesis of the cervical spine. 2. 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 Electronically Signed   By: Helyn Numbers M.D.   On: 02/24/2023 19:31   CT HEAD WO CONTRAST  Result Date: 02/24/2023 CLINICAL DATA:  Blunt trauma. EXAM: CT HEAD WITHOUT CONTRAST TECHNIQUE: Contiguous axial images were obtained from the base of the skull through the vertex without intravenous contrast. RADIATION DOSE REDUCTION: This exam was performed according to the departmental dose-optimization program which includes automated exposure control, adjustment of the mA and/or kV according to patient size and/or use of iterative reconstruction technique. COMPARISON:  Head CT 09/23/2021 FINDINGS: Brain: No intracranial hemorrhage, mass effect, or midline shift. No hydrocephalus. The basilar cisterns are patent. No evidence of territorial infarct or acute ischemia. No extra-axial or intracranial fluid collection. Vascular: Atherosclerosis of skullbase vasculature without hyperdense vessel or abnormal calcification. Skull: No fracture or focal lesion. Sinuses/Orbits: No acute finding. Other: No significant scalp hematoma. IMPRESSION: No acute intracranial abnormality. No skull  fracture. Electronically Signed   By: Narda Rutherford M.D.   On: 02/24/2023 19:21   DG Chest Portable  1 View  Result Date: 02/24/2023 CLINICAL DATA:  Altered mental status EXAM: PORTABLE CHEST 1 VIEW COMPARISON:  Chest radiograph dated 05/25/2020 FINDINGS: Low lung volumes with bronchovascular crowding. Bibasilar patchy and right basilar linear opacities. No pleural effusion or pneumothorax. The heart size and mediastinal contours are within normal limits. No acute osseous abnormality. IMPRESSION: Low lung volumes with bibasilar patchy and right basilar linear opacities, likely atelectasis. Aspiration or pneumonia can be considered in the appropriate clinical setting. Electronically Signed   By: Agustin Cree M.D.   On: 02/24/2023 19:18     Subjective: No acute issues/events this am   Discharge Exam: Vitals:   02/25/23 0700 02/25/23 0800  BP: 99/65 123/70  Pulse: (!) 48 (!) 52  Resp: 15 14  Temp:  (!) 97.5 F (36.4 C)  SpO2: 100% 100%   Vitals:   02/25/23 0500 02/25/23 0600 02/25/23 0700 02/25/23 0800  BP: 110/67 134/76 99/65 123/70  Pulse: (!) 50 (!) 50 (!) 48 (!) 52  Resp: 14 14 15 14   Temp:    (!) 97.5 F (36.4 C)  TempSrc:    Oral  SpO2: 100% 100% 100% 100%  Weight:      Height:        General: Pt is alert, awake, not in acute distress Cardiovascular: RRR, S1/S2 +, no rubs, no gallops Respiratory: CTA bilaterally, no wheezing, no rhonchi Abdominal: Soft, NT, ND, bowel sounds + Extremities: no edema, no cyanosis  The results of significant diagnostics from this hospitalization (including imaging, microbiology, ancillary and laboratory) are listed below for reference.     Microbiology: Recent Results (from the past 240 hour(s))  Blood culture (routine x 2)     Status: None (Preliminary result)   Collection Time: 02/24/23  5:39 PM   Specimen: BLOOD RIGHT HAND  Result Value Ref Range Status   Specimen Description   Final    BLOOD RIGHT HAND Performed at [facility], [address] [place], [place] [address]    Special Requests   Final    BOTTLES DRAWN AEROBIC  AND ANAEROBIC Blood Culture adequate volume Performed at [facility], [address], [place], [place] [address]    Culture   Final    NO GROWTH < 24 HOURS Performed at [facility], [address], [place], [place] [address]    Report Status PENDING  Incomplete  Blood culture (routine x 2)     Status: None (Preliminary result)   Collection Time: 02/24/23  5:47 PM   Specimen: BLOOD LEFT ARM  Result Value Ref Range Status   Specimen Description   Final    BLOOD LEFT ARM Performed at [facility], [address], [place], [place] [address]    Special Requests   Final    BOTTLES DRAWN AEROBIC AND ANAEROBIC Blood Culture adequate volume Performed at [facility], [address] [place], [place] [address]    Culture   Final    NO GROWTH < 24 HOURS Performed at [facility], [address] [place], [place] [address]    Report Status PENDING  Incomplete  MRSA Next Gen by PCR, Nasal     Status: None   Collection Time: 02/25/23  3:49 AM   Specimen: Nasal Mucosa; Nasal Swab  Result Value Ref Range Status   MRSA by  PCR Next Gen NOT DETECTED NOT DETECTED Final    Comment: (NOTE) The GeneXpert MRSA Assay (FDA approved for NASAL specimens only), is one component of a comprehensive MRSA colonization surveillance program. It is not intended to diagnose MRSA infection nor to guide or monitor treatment for MRSA infections. Test performance is not FDA approved in patients less than 23 years old. Performed at [facility], [address] [place], [place] [address]      Labs: BNP (last 3 results) No results for input(s): "BNP" in the last 8760 hours. Basic Metabolic Panel: Recent Labs  Lab 02/24/23 1739 02/24/23 1747 02/25/23 0300  NA 125* 127* 131*  K 3.9 4.3 3.8  CL 97* 94* 101  CO2 23  --  24  GLUCOSE 122* 120* 101*  BUN 10 9 8   CREATININE 1.12 1.10 0.97  CALCIUM 7.7*  --  8.0*  MG  --   --  2.1   PHOS  --   --  3.5   Liver Function Tests: Recent Labs  Lab 02/24/23 1739  AST 19  ALT 12  ALKPHOS 72  BILITOT 0.6  PROT 6.3*  ALBUMIN 3.0*   No results for input(s): "LIPASE", "AMYLASE" in the last 168 hours. Recent Labs  Lab 02/24/23 1739  AMMONIA 39*   CBC: Recent Labs  Lab 02/24/23 1739 02/24/23 1747 02/25/23 0300  WBC 9.7  --  7.8  NEUTROABS 7.2  --   --   HGB 8.1* 8.5* 8.5*  HCT 24.3* 25.0* 26.8*  MCV 82.9  --  84.5  PLT 274  --  283   Cardiac Enzymes: No results for input(s): "CKTOTAL", "CKMB", "CKMBINDEX", "TROPONINI" in the last 168 hours. BNP: Invalid input(s): "POCBNP" CBG: Recent Labs  Lab 02/24/23 1720 02/25/23 0130  GLUCAP 115* 96   D-Dimer No results for input(s): "DDIMER" in the last 72 hours. Hgb A1c No results for input(s): "HGBA1C" in the last 72 hours. Lipid Profile No results for input(s): "CHOL", "HDL", "LDLCALC", "TRIG", "CHOLHDL", "LDLDIRECT" in the last 72 hours. Thyroid function studies No results for input(s): "TSH", "T4TOTAL", "T3FREE", "THYROIDAB" in the last 72 hours.  Invalid input(s): "FREET3" Anemia work up Recent Labs    02/24/23 2322  FERRITIN 31  TIBC 389  IRON 28*   Urinalysis    Component Value Date/Time   COLORURINE COLORLESS (A) 02/24/2023 2118   APPEARANCEUR CLEAR 02/24/2023 2118   LABSPEC 1.003 (L) 02/24/2023 2118   PHURINE 7.0 02/24/2023 2118   GLUCOSEU NEGATIVE 02/24/2023 2118   HGBUR NEGATIVE 02/24/2023 2118   BILIRUBINUR NEGATIVE 02/24/2023 2118   KETONESUR NEGATIVE 02/24/2023 2118   PROTEINUR NEGATIVE 02/24/2023 2118   UROBILINOGEN 0.2 04/14/2009 0446   NITRITE NEGATIVE 02/24/2023 2118   LEUKOCYTESUR NEGATIVE 02/24/2023 2118   Sepsis Labs Recent Labs  Lab 02/24/23 1739 02/25/23 0300  WBC 9.7 7.8   Microbiology Recent Results (from the past 240 hour(s))  Blood culture (routine x 2)     Status: None (Preliminary result)   Collection Time: 02/24/23  5:39 PM   Specimen: BLOOD RIGHT  HAND  Result Value Ref Range Status   Specimen Description   Final    BLOOD RIGHT HAND Performed at [facility], [address] [place], [place] [address]    Special Requests   Final    BOTTLES DRAWN AEROBIC AND ANAEROBIC Blood Culture adequate volume Performed at [facility], [address] [place], [place] [address]    Culture  Final    NO GROWTH < 24 HOURS Performed at [facility], [address] [place], [place] [address]    Report Status PENDING  Incomplete  Blood culture (routine x 2)     Status: None (Preliminary result)   Collection Time: 02/24/23  5:47 PM   Specimen: BLOOD LEFT ARM  Result Value Ref Range Status   Specimen Description   Final    BLOOD LEFT ARM Performed at [facility], [address] [place], [place] [address]    Special Requests   Final    BOTTLES DRAWN AEROBIC AND ANAEROBIC Blood Culture adequate volume Performed at [facility], [address], [place], [place] [address]    Culture   Final    NO GROWTH < 24 HOURS Performed at [facility], [address], [place], [place] [address]    Report Status PENDING  Incomplete  MRSA Next Gen by PCR, Nasal     Status: None   Collection Time: 02/25/23  3:49 AM   Specimen: Nasal Mucosa; Nasal Swab  Result Value Ref Range Status   MRSA by PCR Next Gen NOT DETECTED NOT DETECTED Final    Comment: (NOTE) The GeneXpert MRSA Assay (FDA approved for NASAL specimens only), is one component of a comprehensive MRSA colonization surveillance program. It is not intended to diagnose MRSA infection nor to guide or monitor treatment for MRSA infections. Test performance is not FDA approved in patients less than 29 years old. Performed at [facility], [address], [place], [place] [address]      Time coordinating discharge: Over 30 minutes  SIGNED:   Azucena Fallen, DO [facility] 02/25/2023, 11:44  AM Pager   If 7PM-7AM, please contact night-coverage [email or web address]

## 2023-02-26 LAB — CULTURE, BLOOD (ROUTINE X 2): Special Requests: ADEQUATE

## 2023-02-27 LAB — CULTURE, BLOOD (ROUTINE X 2)

## 2023-02-28 LAB — CULTURE, BLOOD (ROUTINE X 2)

## 2023-03-01 LAB — CULTURE, BLOOD (ROUTINE X 2)
Culture: NO GROWTH
Culture: NO GROWTH
Special Requests: ADEQUATE

## 2023-03-18 NOTE — Telephone Encounter (Signed)
Dont need to call unless he calls back.  I've decided I am not familiar enough with Rx testosterone to do that anymore.  There are risks like prostate cancer and heart disease among others.

## 2023-03-19 ENCOUNTER — Observation Stay (HOSPITAL_COMMUNITY)
Admission: EM | Admit: 2023-03-19 | Discharge: 2023-03-20 | Disposition: A | Discharge status: Home / Self Care | Payer: Medicare HMO | Attending: Internal Medicine | Admitting: Internal Medicine

## 2023-03-19 ENCOUNTER — Emergency Department (HOSPITAL_COMMUNITY): Payer: Medicare HMO

## 2023-03-19 ENCOUNTER — Other Ambulatory Visit: Payer: Self-pay

## 2023-03-19 ENCOUNTER — Encounter (HOSPITAL_COMMUNITY): Payer: Self-pay

## 2023-03-19 ENCOUNTER — Other Ambulatory Visit (HOSPITAL_COMMUNITY): Payer: Medicare HMO

## 2023-03-19 DIAGNOSIS — G929 Unspecified toxic encephalopathy: Secondary | ICD-10-CM | POA: Diagnosis not present

## 2023-03-19 DIAGNOSIS — R531 Weakness: Secondary | ICD-10-CM

## 2023-03-19 DIAGNOSIS — Z96642 Presence of left artificial hip joint: Secondary | ICD-10-CM | POA: Insufficient documentation

## 2023-03-19 DIAGNOSIS — Z85828 Personal history of other malignant neoplasm of skin: Secondary | ICD-10-CM | POA: Insufficient documentation

## 2023-03-19 DIAGNOSIS — F39 Unspecified mood [affective] disorder: Secondary | ICD-10-CM | POA: Diagnosis present

## 2023-03-19 DIAGNOSIS — Z79899 Other long term (current) drug therapy: Secondary | ICD-10-CM | POA: Insufficient documentation

## 2023-03-19 DIAGNOSIS — I6529 Occlusion and stenosis of unspecified carotid artery: Secondary | ICD-10-CM | POA: Diagnosis not present

## 2023-03-19 DIAGNOSIS — I10 Essential (primary) hypertension: Secondary | ICD-10-CM | POA: Diagnosis not present

## 2023-03-19 DIAGNOSIS — F17210 Nicotine dependence, cigarettes, uncomplicated: Secondary | ICD-10-CM | POA: Insufficient documentation

## 2023-03-19 DIAGNOSIS — R410 Disorientation, unspecified: Secondary | ICD-10-CM

## 2023-03-19 DIAGNOSIS — Z66 Do not resuscitate: Secondary | ICD-10-CM | POA: Insufficient documentation

## 2023-03-19 LAB — COMPREHENSIVE METABOLIC PANEL
ALT: 16 U/L (ref 0–44)
AST: 20 U/L (ref 15–41)
Albumin: 3.8 g/dL (ref 3.5–5.0)
Alkaline Phosphatase: 107 U/L (ref 38–126)
Anion gap: 15 (ref 5–15)
BUN: 8 mg/dL (ref 8–23)
CO2: 21 mmol/L — ABNORMAL LOW (ref 22–32)
Calcium: 9.2 mg/dL (ref 8.9–10.3)
Chloride: 95 mmol/L — ABNORMAL LOW (ref 98–111)
Creatinine, Ser: 1.3 mg/dL — ABNORMAL HIGH (ref 0.61–1.24)
GFR, Estimated: >60 mL/min (ref >60)
Glucose, Bld: 98 mg/dL (ref 70–99)
Potassium: 4.1 mmol/L (ref 3.5–5.1)
Sodium: 131 mmol/L — ABNORMAL LOW (ref 135–145)
Total Bilirubin: 0.2 mg/dL — ABNORMAL LOW (ref 0.3–1.2)
Total Protein: 7 g/dL (ref 6.5–8.1)

## 2023-03-19 LAB — I-STAT CHEM 8, ED
BUN: 9 mg/dL (ref 8–23)
Calcium, Ion: 1.12 mmol/L — ABNORMAL LOW (ref 1.15–1.40)
Chloride: 95 mmol/L — ABNORMAL LOW (ref 98–111)
Creatinine, Ser: 1.3 mg/dL — ABNORMAL HIGH (ref 0.61–1.24)
Glucose, Bld: 89 mg/dL (ref 70–99)
HCT: 28 % — ABNORMAL LOW (ref 39.0–52.0)
Hemoglobin: 9.5 g/dL — ABNORMAL LOW (ref 13.0–17.0)
Potassium: 4.1 mmol/L (ref 3.5–5.1)
Sodium: 130 mmol/L — ABNORMAL LOW (ref 135–145)
TCO2: 24 mmol/L (ref 22–32)

## 2023-03-19 LAB — MAGNESIUM: Magnesium: 1.8 mg/dL (ref 1.7–2.4)

## 2023-03-19 LAB — CBC WITH DIFFERENTIAL/PLATELET
Abs Immature Granulocytes: 0.09 10*3/uL — ABNORMAL HIGH (ref 0.00–0.07)
Basophils Absolute: 0 10*3/uL (ref 0.0–0.1)
Basophils Relative: 0 %
Eosinophils Absolute: 0 10*3/uL (ref 0.0–0.5)
Eosinophils Relative: 0 %
HCT: 31.1 % — ABNORMAL LOW (ref 39.0–52.0)
Hemoglobin: 10.1 g/dL — ABNORMAL LOW (ref 13.0–17.0)
Immature Granulocytes: 1 %
Lymphocytes Relative: 20 %
Lymphs Abs: 2.4 10*3/uL (ref 0.7–4.0)
MCH: 26.4 pg (ref 26.0–34.0)
MCHC: 32.5 g/dL (ref 30.0–36.0)
MCV: 81.4 fL (ref 80.0–100.0)
Monocytes Absolute: 1.1 10*3/uL — ABNORMAL HIGH (ref 0.1–1.0)
Monocytes Relative: 10 %
Neutro Abs: 8.3 10*3/uL — ABNORMAL HIGH (ref 1.7–7.7)
Neutrophils Relative %: 69 %
Platelets: 407 10*3/uL — ABNORMAL HIGH (ref 150–400)
RBC: 3.82 MIL/uL — ABNORMAL LOW (ref 4.22–5.81)
RDW: 14.5 % (ref 11.5–15.5)
WBC: 12 10*3/uL — ABNORMAL HIGH (ref 4.0–10.5)
nRBC: 0 % (ref 0.0–0.2)

## 2023-03-19 LAB — RAPID URINE DRUG SCREEN, HOSP PERFORMED
Amphetamines: NOT DETECTED
Barbiturates: NOT DETECTED
Benzodiazepines: NOT DETECTED
Cocaine: NOT DETECTED
Opiates: NOT DETECTED
Tetrahydrocannabinol: NOT DETECTED

## 2023-03-19 LAB — URINALYSIS, ROUTINE W REFLEX MICROSCOPIC
Bilirubin Urine: NEGATIVE
Glucose, UA: NEGATIVE mg/dL
Hgb urine dipstick: NEGATIVE
Ketones, ur: NEGATIVE mg/dL
Leukocytes,Ua: NEGATIVE
Nitrite: NEGATIVE
Protein, ur: NEGATIVE mg/dL
Specific Gravity, Urine: <1.005 — ABNORMAL LOW (ref 1.005–1.030)
pH: 6.5 (ref 5.0–8.0)

## 2023-03-19 LAB — CBG MONITORING, ED: Glucose-Capillary: 81 mg/dL (ref 70–99)

## 2023-03-19 LAB — ETHANOL: Alcohol, Ethyl (B): <10 mg/dL (ref <10)

## 2023-03-19 MED ORDER — HYDRALAZINE HCL 20 MG/ML IJ SOLN: 5.0000 mg | INTRAMUSCULAR | Status: DC | PRN

## 2023-03-19 MED ORDER — ACETAMINOPHEN 325 MG PO TABS: 650.0000 mg | ORAL_TABLET | Freq: Four times a day (QID) | ORAL | Status: DC | PRN

## 2023-03-19 MED ORDER — HYDROXYZINE HCL 25 MG PO TABS: 25.0000 mg | ORAL_TABLET | Freq: Four times a day (QID) | ORAL | Status: DC | PRN

## 2023-03-19 MED ORDER — ACETAMINOPHEN 650 MG RE SUPP: 650.0000 mg | Freq: Four times a day (QID) | RECTAL | Status: DC | PRN

## 2023-03-19 MED ORDER — LAMOTRIGINE 100 MG PO TABS
100.0000 mg | ORAL_TABLET | Freq: Two times a day (BID) | ORAL | Status: DC
  Administered 2023-03-19 – 2023-03-20 (×2): 100 mg via ORAL
  Filled 2023-03-19 (×2): qty 1

## 2023-03-19 MED ORDER — ONDANSETRON HCL 4 MG PO TABS: 4.0000 mg | ORAL_TABLET | Freq: Four times a day (QID) | ORAL | Status: DC | PRN

## 2023-03-19 MED ORDER — FLUOXETINE HCL 20 MG PO CAPS
40.0000 mg | ORAL_CAPSULE | Freq: Every day | ORAL | Status: DC
  Administered 2023-03-19 – 2023-03-20 (×2): 40 mg via ORAL
  Filled 2023-03-19 (×2): qty 2

## 2023-03-19 MED ORDER — QUETIAPINE FUMARATE 100 MG PO TABS
400.0000 mg | ORAL_TABLET | Freq: Every day | ORAL | Status: DC
  Administered 2023-03-19: 400 mg via ORAL
  Filled 2023-03-19: qty 4

## 2023-03-19 MED ORDER — DOCUSATE SODIUM 100 MG PO CAPS
100.0000 mg | ORAL_CAPSULE | Freq: Two times a day (BID) | ORAL | Status: DC
  Administered 2023-03-19 – 2023-03-20 (×2): 100 mg via ORAL
  Filled 2023-03-19 (×2): qty 1

## 2023-03-19 MED ORDER — CLONIDINE HCL 0.2 MG PO TABS
0.2000 mg | ORAL_TABLET | Freq: Two times a day (BID) | ORAL | Status: DC
  Administered 2023-03-19 – 2023-03-20 (×2): 0.2 mg via ORAL
  Filled 2023-03-19 (×2): qty 1

## 2023-03-19 MED ORDER — ENOXAPARIN SODIUM 40 MG/0.4ML IJ SOSY
40.0000 mg | PREFILLED_SYRINGE | INTRAMUSCULAR | Status: DC
  Administered 2023-03-19: 40 mg via SUBCUTANEOUS
  Filled 2023-03-19: qty 0.4

## 2023-03-19 MED ORDER — PREGABALIN 75 MG PO CAPS
150.0000 mg | ORAL_CAPSULE | Freq: Two times a day (BID) | ORAL | Status: DC
  Administered 2023-03-19 – 2023-03-20 (×2): 150 mg via ORAL
  Filled 2023-03-19 (×2): qty 2

## 2023-03-19 MED ORDER — IOHEXOL 350 MG/ML SOLN
150.0000 mL | Freq: Once | INTRAVENOUS | Status: AC | PRN
  Administered 2023-03-19: 150 mL via INTRAVENOUS

## 2023-03-19 MED ORDER — POLYETHYLENE GLYCOL 3350 17 G PO PACK: 17.0000 g | PACK | Freq: Every day | ORAL | Status: DC | PRN

## 2023-03-19 MED ORDER — BISACODYL 5 MG PO TBEC: 5.0000 mg | DELAYED_RELEASE_TABLET | Freq: Every day | ORAL | Status: DC | PRN

## 2023-03-19 MED ORDER — ZOLPIDEM TARTRATE 5 MG PO TABS
5.0000 mg | ORAL_TABLET | Freq: Every day | ORAL | Status: DC
  Administered 2023-03-19: 5 mg via ORAL
  Filled 2023-03-19: qty 1

## 2023-03-19 MED ORDER — ONDANSETRON HCL 4 MG/2ML IJ SOLN: 4.0000 mg | Freq: Four times a day (QID) | INTRAMUSCULAR | Status: DC | PRN

## 2023-03-19 MED ORDER — LACTATED RINGERS IV SOLN: INTRAVENOUS | Status: DC

## 2023-03-19 NOTE — ED Triage Notes (Signed)
PT [facility] for immobility, pt currently AOX4, but has some confusion on certain topics, last known well was 2300 last night, but at 0730 this morning wife noticed a difference in gait and mental status.  Has tremors today, has had before but it's been a while.    SR HR 70 156/80 CBG 114 98% RA  Resp 17

## 2023-03-19 NOTE — ED Provider Notes (Signed)
[place] EMERGENCY DEPARTMENT AT [facility] Provider Note   CSN: [id number] Arrival date & time: 03/19/23  1505     History  Chief Complaint  Patient presents with   Weakness    Confused    David [name] is a 67 y.o. male with EtOH abuse, HTN, GAD, ADD, seizure disorder on lamictal, polypharmacy presents with confusion, weakness.   PT [facility] for generalized weakness and confusion, pt currently AOX4, but has some confusion on certain topics, last known well was 2300 last night, but at 0730 this morning wife noticed a difference in mental status and he collapsed but did not lose consciousness in the shower did not hit his head. Per chart review, presented and was admitted on 02/24/23 for encephalopathy a/w likely polypharmacy, mixing up his medicines at home.   History provided by wife. He was confused this morning, starting sentences and not finishing them. She saw him outside on the porch trying to type on his phone and repeatedly dropping it onto the ground. He then decided to get into the shower with his clothes on, then sat down in the shower and couldn't get back up. She didn't notice any facial droop, asymmetric weakness, slurred speech. Did not his head or have any head trauma today. He had Mohs surgery on his nose earlier last week and she notes he has been sleeping a lot this week. He hasn't complained of any fevers/chills, HA, SOB, CP, visual changes, asymmetric weakness, numbness/tingling.  Has no h/o CVA. Does not take a blood thinner.  She states something similar happened when he started his seizure medicine, he was dropping things sometimes, but that was very different than what is happening today.  On my interview with the patient, he states that he got into the shower and collapsed earlier.  He also states he has felt mildly confused over the last couple of days.  Throughout the interview he intermittently goes on tangents about stories from [place] that do not  have anything to do with the question he was asked.  He follows all commands and moves all extremities.  Denies any asymmetric weakness, numbness tingling, facial droop.  He states that he felt like his legs were weak below the knees bilaterally which is why he could not get up earlier.  Patient remembers that last time he was hospitalized for similar instance he thinks he took too much medication, but he states that this morning he only took 2 Lyrica, he does not think he over used or missed used his medication last night or today.   Weakness      Home Medications Prior to Admission medications   Medication Sig Start Date End Date Taking? Authorizing Provider  cetaphil (CETAPHIL) lotion Apply 1 application topically as needed for dry skin.     [provider]  cloNIDine (CATAPRES) 0.2 MG tablet TAKE 1 TABLET(0.2 MG) BY MOUTH TWICE DAILY Patient taking differently: Take 0.2 mg by mouth 2 (two) times daily. 01/05/23   Cottle, Steva Ready., MD  FLUoxetine (PROZAC) 20 MG capsule Take 2 capsules (40 mg total) by mouth daily. 11/05/22   Cottle, Steva Ready., MD  hydrOXYzine (ATARAX) 25 MG tablet TAKE 1 TABLET BY MOUTH EVERY 6 HOURS AS NEEDED FOR ITCHING OR ANXIETY Patient taking differently: Take 25 mg by mouth every 6 (six) hours as needed for itching or anxiety. 01/27/23   Cottle, Steva Ready., MD  lamoTRIgine (LAMICTAL) 100 MG tablet Take 1 tablet (  100 mg total) by mouth 2 (two) times daily. Appointment needed for further refills 01/26/23   Levert Feinstein, MD  metoprolol succinate (TOPROL-XL) 100 MG 24 hr tablet TAKE 1 TABLET BY MOUTH EVERY DAY 12/02/21   Cottle, Steva Ready., MD  modafinil (PROVIGIL) 200 MG tablet TAKE 1 TABLET BY MOUTH DAILY Patient taking differently: Take 200 mg by mouth daily. 02/10/23   Cottle, Steva Ready., MD  ondansetron (ZOFRAN) 8 MG tablet TAKE 1 TABLET(8 MG) BY MOUTH EVERY 8 HOURS AS NEEDED FOR NAUSEA OR VOMITING Patient taking differently: Take 8 mg by mouth every 8  (eight) hours as needed for nausea or vomiting. 01/27/23   Cottle, Steva Ready., MD  pregabalin (LYRICA) 150 MG capsule TAKE 1 CAPSULE(150 MG) BY MOUTH TWICE DAILY Patient taking differently: Take 150 mg by mouth 2 (two) times daily. 01/05/23   Cottle, Steva Ready., MD  QUEtiapine (SEROQUEL) 200 MG tablet TAKE 3 TABLETS(600 MG) BY MOUTH AT BEDTIME Patient taking differently: Take 400-600 mg by mouth at bedtime. 11/05/22   Cottle, Steva Ready., MD  RINVOQ 15 MG TB24 Take 15 mg by mouth daily.    [provider]  zolpidem (AMBIEN) 10 MG tablet TAKE 1 TO 1 AND 1/2 TABLETS BY MOUTH AT NIGHT AS NEEDED FOR SLEEP Patient taking differently: Take 10-15 mg by mouth at bedtime. 01/05/23   Cottle, Steva Ready., MD      Allergies    Other and Codeine    Review of Systems   Review of Systems  Neurological:  Positive for weakness.   A 10 point review of systems was performed and is negative unless otherwise reported in HPI.  Physical Exam Updated Vital Signs BP (!) 143/106   Pulse 93   Temp 97.7 F (36.5 C) (Oral)   Resp 20   SpO2 97%  Physical Exam General: Normal appearing male, lying in bed.  HEENT: PERRLA, EOMI, Sclera anicteric, MMM, trachea midline. NCAT. No nuchal rigidity. Intermittent tongue protruding in TD manner Cardiology: RRR, no murmurs/rubs/gallops. BL radial and DP pulses equal bilaterally.  Resp: Normal respiratory rate and effort. CTAB, no wheezes, rhonchi, crackles.  Abd: Soft, non-tender, non-distended. No rebound tenderness or guarding.  GU: Deferred. MSK: No peripheral edema or signs of trauma. Extremities without deformity or TTP. No cyanosis or clubbing. Skin: warm, dry.  Neuro: A&Ox4, however confusion intermittently throughout interview, CNs II-XII grossly intact. MAEs. Sensation grossly intact.  Psych: Normal mood and affect.   1a  Level of consciousness: 0=alert; keenly responsive  1b. LOC questions:  0=Performs both tasks correctly  1c. LOC commands:  0=Performs both tasks correctly  2.  Best Gaze: 0=normal  3.  Visual: 0=No visual loss  4. Facial Palsy: 0=Normal symmetric movement  5a.  Motor left arm: 0=No drift, limb holds 90 (or 45) degrees for full 10 seconds  5b.  Motor right arm: 0=No drift, limb holds 90 (or 45) degrees for full 10 seconds  6a. motor left leg: 0=No drift, limb holds 90 (or 45) degrees for full 10 seconds  6b  Motor right leg:  0=No drift, limb holds 90 (or 45) degrees for full 10 seconds  7. Limb Ataxia: 0=Absent  8.  Sensory: 0=Normal; no sensory loss  9. Best Language:  0=No aphasia, normal  10. Dysarthria: 0=Normal  11. Extinction and Inattention: 0=No abnormality   Total:   0        ED Results / Procedures / Treatments   Labs (  all labs ordered are listed, but only abnormal results are displayed) Labs Reviewed  CBC WITH DIFFERENTIAL/PLATELET - Abnormal; Notable for the following components:      Result Value   WBC 12.0 (*)    RBC 3.82 (*)    Hemoglobin 10.1 (*)    HCT 31.1 (*)    Platelets 407 (*)    Neutro Abs 8.3 (*)    Monocytes Absolute 1.1 (*)    Abs Immature Granulocytes 0.09 (*)    All other components within normal limits  COMPREHENSIVE METABOLIC PANEL - Abnormal; Notable for the following components:   Sodium 131 (*)    Chloride 95 (*)    CO2 21 (*)    Creatinine, Ser 1.30 (*)    Total Bilirubin 0.2 (*)    All other components within normal limits  I-STAT CHEM 8, ED - Abnormal; Notable for the following components:   Sodium 130 (*)    Chloride 95 (*)    Creatinine, Ser 1.30 (*)    Calcium, Ion 1.12 (*)    Hemoglobin 9.5 (*)    HCT 28.0 (*)    All other components within normal limits  MAGNESIUM  ETHANOL  URINALYSIS, ROUTINE W REFLEX MICROSCOPIC  RAPID URINE DRUG SCREEN, HOSP PERFORMED  LAMOTRIGINE LEVEL  CBG MONITORING, ED    EKG EKG Interpretation Date/Time:  Thursday March 19 2023 15:18:04 EDT Ventricular Rate:  91 PR Interval:  175 QRS Duration:  98 QT  Interval:  347 QTC Calculation: 427 R Axis:   85  Text Interpretation: Sinus rhythm Borderline right axis deviation Confirmed by Vivi Barrack [phone]) on 03/19/2023 3:54:29 PM  Radiology CT ANGIO HEAD NECK W WO CM  Result Date: 03/19/2023 CLINICAL DATA:  Stroke suspected.  Mental status change. EXAM: CT ANGIOGRAPHY HEAD AND NECK WITH AND WITHOUT CONTRAST TECHNIQUE: Multidetector CT imaging of the head and neck was performed using the standard protocol during bolus administration of intravenous contrast. Multiplanar CT image reconstructions and MIPs were obtained to evaluate the vascular anatomy. Carotid stenosis measurements (when applicable) are obtained utilizing NASCET criteria, using the distal internal carotid diameter as the denominator. RADIATION DOSE REDUCTION: This exam was performed according to the departmental dose-optimization program which includes automated exposure control, adjustment of the mA and/or kV according to patient size and/or use of iterative reconstruction technique. CONTRAST:  OMNIPAQUE IOHEXOL 350 MG/ML SOLN COMPARISON:  Same-day noncontrast CT head FINDINGS: CTA NECK FINDINGS Aortic arch: The imaged aortic arch is normal. The origins of the major branch vessels are patent. The subclavian arteries are patent to the level imaged. Right carotid system: The right common carotid artery is patent. There is bulky calcified plaque in the proximal internal carotid artery resulting in up to approximately 70% stenosis. The distal internal carotid artery is widely patent. The external carotid artery is patent. There is no evidence of dissection or aneurysm. Left carotid system: The left common carotid artery is patent with mild focal plaque in the mid segment. There is mild plaque at the bifurcation and proximal internal carotid artery without hemodynamically significant stenosis or occlusion. The external carotid artery is patent. There is no evidence of dissection or aneurysm.  Vertebral arteries: The vertebral arteries are patent, without hemodynamically significant stenosis or occlusion. There is no evidence of dissection or aneurysm. Skeleton: There is no acute osseous abnormality or suspicious osseous lesion. There is no visible canal hematoma. Other neck: The soft tissues of the neck are unremarkable. Upper chest: There is mild subpleural fibrotic  change in the lung apices. Review of the MIP images confirms the above findings CTA HEAD FINDINGS Anterior circulation: There is mild calcified plaque in the intracranial ICAs without significant stenosis or occlusion. The bilateral MCAs and ACAS are patent, without proximal stenosis or occlusion. The anterior communicating artery is normal There is no aneurysm or AVM. Posterior circulation: The bilateral V4 segments are patent. The basilar artery is patent. The major cerebellar arteries appear patent. The bilateral PCAs are patent, without proximal stenosis or occlusion. Small bilateral posterior communicating arteries are identified. There is no aneurysm or AVM. Venous sinuses: Patent. Anatomic variants: None. Review of the MIP images confirms the above findings IMPRESSION: 1. No emergent large vessel occlusion. 2. Bulky calcified plaque in the proximal right internal carotid artery resulting in approximately 70% stenosis. 3. Mild plaque in the left carotid system without hemodynamically significant stenosis or occlusion. 4. Patent intracranial vasculature. Electronically Signed   By: Lesia Hausen M.D.   On: 03/19/2023 17:12   CT Head Wo Contrast  Result Date: 03/19/2023 CLINICAL DATA:  Mental status change EXAM: CT HEAD WITHOUT CONTRAST TECHNIQUE: Contiguous axial images were obtained from the base of the skull through the vertex without intravenous contrast. RADIATION DOSE REDUCTION: This exam was performed according to the departmental dose-optimization program which includes automated exposure control, adjustment of the mA and/or kV  according to patient size and/or use of iterative reconstruction technique. COMPARISON:  02/24/2023 FINDINGS: Brain: No evidence of acute infarction, hemorrhage, extra-axial collection, ventriculomegaly, or mass effect. Generalized cerebral atrophy. Periventricular white matter low attenuation likely secondary to microangiopathy. Vascular: Cerebrovascular atherosclerotic calcifications are noted. Skull: Negative for fracture or focal lesion. Sinuses/Orbits: Visualized portions of the orbits are unremarkable. Visualized portions of the paranasal sinuses are unremarkable. Visualized portions of the mastoid air cells are unremarkable. Other: None. IMPRESSION: 1. No acute intracranial findings. 2. Chronic small vessel ischemic changes. Electronically Signed   By: Elige Ko M.D.   On: 03/19/2023 16:45    Procedures Procedures    Medications Ordered in ED Medications  iohexol (OMNIPAQUE) 350 MG/ML injection 150 mL (150 mLs Intravenous Contrast Given 03/19/23 1618)    ED Course/ Medical Decision Making/ A&P                          Medical Decision Making Amount and/or Complexity of Data Reviewed Labs: ordered. Decision-making details documented in ED Course. Radiology: ordered. Decision-making details documented in ED Course.  Risk Prescription drug management. Decision regarding hospitalization.    This patient presents to the ED for concern of AMS, this involves an extensive number of treatment options, and is a complaint that carries with it a high risk of complications and morbidity.  I considered the following differential and admission for this acute, potentially life threatening condition.   MDM:    Ddx of acute altered mental status or encephalopathy considered but not limited to: -Intracranial abnormalities such as ICH, hydrocephalus, head trauma - he did not have a CTA H&N performed during last admission and d/t c/f ischemic CVA will perform one now -Also consider atypical  seizures, though no distinct seizure-like activity was noted - will order EEG -Infection such as UTI, PNA, or meningitis - afebrile, no infectious symptoms reported, moving neck freely on exam w/ no nuchal rigidity -Toxic ingestion such as polypharmacy as was admitted recently for encephalopathy blamed on polypharmacy, though wife does not think that occurred this time -Electrolyte abnormalities or hyper/hypoglycemia -ACS or arrhythmia -  no CP, SOB, palpitations, EKG without signs of ischemia   Clinical Course as of 03/19/23 1753  Thu Mar 19, 2023  1553 WBC(!): 12.0 +Leukocytosis w/ left shift [HN]  1706 CT Head Wo Contrast 1. No acute intracranial findings. 2. Chronic small vessel ischemic changes.   [HN]  1706 Creatinine(!): 1.30 BL 1-1.1 [HN]  1717 CT ANGIO HEAD NECK W WO CM 1. No emergent large vessel occlusion. 2. Bulky calcified plaque in the proximal right internal carotid artery resulting in approximately 70% stenosis. 3. Mild plaque in the left carotid system without hemodynamically significant stenosis or occlusion. 4. Patent intracranial vasculature.   [HN]  1738 Hemoglobin(!): 10.1 Improved from 8 three weeks ago [HN]    Clinical Course User Index [HN] Loetta Rough, MD    Labs: I Ordered, and personally interpreted labs.  The pertinent results include:  those listed above Imaging Studies ordered: I ordered imaging studies including CTH, CTA H&N I independently visualized and interpreted imaging. I agree with the radiologist interpretation Additional history obtained from wife at bedside, chart review.  Cardiac Monitoring: The patient was maintained on a cardiac monitor.  I personally viewed and interpreted the cardiac monitored which showed an underlying rhythm of: NSR Reevaluation: I reevaluated the patient and found that they have :stayed the same Social Determinants of Health: Patient lives independently    Disposition: Patient with unknown etiology  encephalopathy.  Did have recent polypharmacy episode however does not have the same history today.  Patient did not have further advanced imaging performed during last admission and I believe he would benefit from an admission including EEG, MRI, and possibly neurology/psychiatry consults. Admitted to medicine.    Co morbidities that complicate the patient evaluation  Past Medical History:  Diagnosis Date   ADD (attention deficit disorder)    Anemia    Microcytic   Arthritis    Bipolar disorder (HCC)    Depression    Dyspnea    history of no current issues 05/26/2019   GAD (generalized anxiety disorder)    History of kidney stones    passed   HTN (hypertension)    PTSD (post-traumatic stress disorder)    Skin cancer      Medicines Meds ordered this encounter  Medications   iohexol (OMNIPAQUE) 350 MG/ML injection 150 mL    I have reviewed the patients home medicines and have made adjustments as needed  Problem List / ED Course: Problem List Items Addressed This Visit   None               This note was created using dictation software, which may contain spelling or grammatical errors.    Loetta Rough, MD 03/19/23 [phone]

## 2023-03-19 NOTE — ED Notes (Signed)
ED TO INPATIENT HANDOFF REPORT  ED Nurse Name and Phone #: Tori [phone]   S Name/Age/Gender David [name] Stable 67 y.o. male Room/Bed: 032C/032C  Code Status   Code Status: DNR  Home/SNF/Other Home Patient oriented to: self, place, time, and situation Is this baseline? Yes   Triage Complete: Triage complete  Chief Complaint Toxic encephalopathy [G92.9]  Triage Note PT [facility] for immobility, pt currently AOX4, but has some confusion on certain topics, last known well was 2300 last night, but at 0730 this morning wife noticed a difference in gait and mental status.  Has tremors today, has had before but it's been a while.    SR HR 70 156/80 CBG 114 98% RA  Resp 17   Allergies Allergies  Allergen Reactions   Other Other (See Comments)    Has eczema, so no strong detergents or dryer sheets   Codeine Nausea Only    Level of Care/Admitting Diagnosis ED Disposition     ED Disposition  Admit   Condition  --   Comment  Hospital Area: [facility] [[id number]]  Level of Care: Med-Surg [16]  May place patient in observation at [facility] or [facility] if equivalent level of care is available:: Yes  Covid Evaluation: Asymptomatic - no recent exposure (last 10 days) testing not required  Diagnosis: Toxic encephalopathy [349.82.ICD-9-CM]  Admitting Physician: Jonah Blue [[id number]]  Attending Physician: Jonah Blue [[id number]]          B Medical/Surgery History Past Medical History:  Diagnosis Date   ADD (attention deficit disorder)    Anemia    Microcytic   Arthritis    Bipolar disorder (HCC)    Depression    Dyspnea    history of no current issues 05/26/2019   GAD (generalized anxiety disorder)    History of kidney stones    passed   HTN (hypertension)    PTSD (post-traumatic stress disorder)    Skin cancer    Past Surgical History:  Procedure Laterality Date   BACK SURGERY     Fusion - lumbar   HIP PINNING,CANNULATED Right 06/24/2018    Procedure: RIGHT CANNULATED HIP PINNING;  Surgeon: Terance Hart, MD;  Location: [facility];  Service: Orthopedics;  Laterality: Right;   LITHOTRIPSY     MULTIPLE EXTRACTIONS WITH ALVEOLOPLASTY Bilateral 05/03/2019   Procedure: MULTIPLE EXTRACTION TEETH NUMBER TWO, THREE, FIVE, SIX, SEVEN, EIGHT, NINE, TEN, ELEVEN, FIFTEEN, SIXTEEN, SEVENTEEN, NINETEEN, TWENTY, TWENTY-ONE, TWENTY-TWO, TWENTY-THREE, TWENTY-FOUR, TWENTY-FIVE, TWENTY-SIX, TWENTY-SEVEN, TWENTY-EIGHT, TWENTY-NINE, THIRTY-TWO WITH ALVEOLOPLASTY;  Surgeon: Ocie Doyne, DDS;  Location: [facility] OR;  Service: Oral Surgery;  Laterality: Bilateral;   NOSE SURGERY     x3   TOTAL HIP ARTHROPLASTY Left 05/27/2019   Procedure: TOTAL HIP ARTHROPLASTY ANTERIOR APPROACH;  Surgeon: Jodi Geralds, MD;  Location: [facility];  Service: Orthopedics;  Laterality: Left;     A IV Location/Drains/Wounds Patient Lines/Drains/Airways Status     Active Line/Drains/Airways     None            Intake/Output Last 24 hours No intake or output data in the 24 hours ending 03/19/23 1854  Labs/Imaging Results for orders placed or performed during the hospital encounter of 03/19/23 (from the past 48 hour(s))  CBC with Differential     Status: Abnormal   Collection Time: 03/19/23  3:15 PM  Result Value Ref Range   WBC 12.0 (H) 4.0 - 10.5 K/uL   RBC 3.82 (L) 4.22 - 5.81 MIL/uL   Hemoglobin  10.1 (L) 13.0 - 17.0 g/dL   HCT 16.1 (L) 09.6 - 04.5 %   MCV 81.4 80.0 - 100.0 fL   MCH 26.4 26.0 - 34.0 pg   MCHC 32.5 30.0 - 36.0 g/dL   RDW 40.9 81.1 - 91.4 %   Platelets 407 (H) 150 - 400 K/uL   nRBC 0.0 0.0 - 0.2 %   Neutrophils Relative % 69 %   Neutro Abs 8.3 (H) 1.7 - 7.7 K/uL   Lymphocytes Relative 20 %   Lymphs Abs 2.4 0.7 - 4.0 K/uL   Monocytes Relative 10 %   Monocytes Absolute 1.1 (H) 0.1 - 1.0 K/uL   Eosinophils Relative 0 %   Eosinophils Absolute 0.0 0.0 - 0.5 K/uL   Basophils Relative 0 %   Basophils Absolute 0.0 0.0 - 0.1 K/uL   Immature  Granulocytes 1 %   Abs Immature Granulocytes 0.09 (H) 0.00 - 0.07 K/uL    Comment: Performed at [facility], [address] [place], [place] [address]  Comprehensive metabolic panel     Status: Abnormal   Collection Time: 03/19/23  3:15 PM  Result Value Ref Range   Sodium 131 (L) 135 - 145 mmol/L   Potassium 4.1 3.5 - 5.1 mmol/L   Chloride 95 (L) 98 - 111 mmol/L   CO2 21 (L) 22 - 32 mmol/L   Glucose, Bld 98 70 - 99 mg/dL    Comment: Glucose reference range applies only to samples taken after fasting for at least 8 hours.   BUN 8 8 - 23 mg/dL   Creatinine, Ser 6.21 (H) 0.61 - 1.24 mg/dL   Calcium 9.2 8.9 - 30.8 mg/dL   Total Protein 7.0 6.5 - 8.1 g/dL   Albumin 3.8 3.5 - 5.0 g/dL   AST 20 15 - 41 U/L   ALT 16 0 - 44 U/L   Alkaline Phosphatase 107 38 - 126 U/L   Total Bilirubin 0.2 (L) 0.3 - 1.2 mg/dL   GFR, Estimated >65 >78 mL/min    Comment: (NOTE) Calculated using the CKD-EPI Creatinine Equation (2021)    Anion gap 15 5 - 15    Comment: Performed at [facility], [address], [place], [place] [address]  Magnesium     Status: None   Collection Time: 03/19/23  3:15 PM  Result Value Ref Range   Magnesium 1.8 1.7 - 2.4 mg/dL    Comment: Performed at [facility], [address], [place], [place] [address]  Ethanol     Status: None   Collection Time: 03/19/23  4:00 PM  Result Value Ref Range   Alcohol, Ethyl (B) <10 <10 mg/dL    Comment: (NOTE) Lowest detectable limit for serum alcohol is 10 mg/dL.  For medical purposes only. Performed at [facility], [address], [place], [place] [address]   POC CBG, ED     Status: None   Collection Time: 03/19/23  4:15 PM  Result Value Ref Range   Glucose-Capillary 81 70 - 99 mg/dL    Comment: Glucose reference range applies only to samples taken after fasting for at least 8 hours.  I-stat chem 8, ED (not at [facility], [facility] or [facility])     Status: Abnormal   Collection Time: 03/19/23  4:20 PM  Result Value Ref  Range   Sodium 130 (L) 135 - 145 mmol/L   Potassium 4.1 3.5 - 5.1 mmol/L   Chloride 95 (L) 98 - 111  mmol/L   BUN 9 8 - 23 mg/dL   Creatinine, Ser 0.98 (H) 0.61 - 1.24 mg/dL   Glucose, Bld 89 70 - 99 mg/dL    Comment: Glucose reference range applies only to samples taken after fasting for at least 8 hours.   Calcium, Ion 1.12 (L) 1.15 - 1.40 mmol/L   TCO2 24 22 - 32 mmol/L   Hemoglobin 9.5 (L) 13.0 - 17.0 g/dL   HCT 11.9 (L) 14.7 - 82.9 %  Urinalysis, Routine w reflex microscopic -Urine, Clean Catch     Status: Abnormal   Collection Time: 03/19/23  5:15 PM  Result Value Ref Range   Color, Urine YELLOW YELLOW   APPearance CLEAR CLEAR   Specific Gravity, Urine <1.005 (L) 1.005 - 1.030   pH 6.5 5.0 - 8.0   Glucose, UA NEGATIVE NEGATIVE mg/dL   Hgb urine dipstick NEGATIVE NEGATIVE   Bilirubin Urine NEGATIVE NEGATIVE   Ketones, ur NEGATIVE NEGATIVE mg/dL   Protein, ur NEGATIVE NEGATIVE mg/dL   Nitrite NEGATIVE NEGATIVE   Leukocytes,Ua NEGATIVE NEGATIVE    Comment: Microscopic not done on urines with negative protein, blood, leukocytes, nitrite, or glucose < 500 mg/dL. Performed at [facility], [address] [place], [place] [address]   Rapid urine drug screen (hospital performed)     Status: None   Collection Time: 03/19/23  5:15 PM  Result Value Ref Range   Opiates NONE DETECTED NONE DETECTED   Cocaine NONE DETECTED NONE DETECTED   Benzodiazepines NONE DETECTED NONE DETECTED   Amphetamines NONE DETECTED NONE DETECTED   Tetrahydrocannabinol NONE DETECTED NONE DETECTED   Barbiturates NONE DETECTED NONE DETECTED    Comment: (NOTE) DRUG SCREEN FOR MEDICAL PURPOSES ONLY.  IF CONFIRMATION IS NEEDED FOR ANY PURPOSE, NOTIFY LAB WITHIN 5 DAYS.  LOWEST DETECTABLE LIMITS FOR URINE DRUG SCREEN Drug Class                     Cutoff (ng/mL) Amphetamine and metabolites    1000 Barbiturate and metabolites    200 Benzodiazepine                 200 Opiates and metabolites         300 Cocaine and metabolites        300 THC                            50 Performed at [facility], [address] [place], [place] [address]    CT ANGIO HEAD NECK W WO CM  Result Date: 03/19/2023 CLINICAL DATA:  Stroke suspected.  Mental status change. EXAM: CT ANGIOGRAPHY HEAD AND NECK WITH AND WITHOUT CONTRAST TECHNIQUE: Multidetector CT imaging of the head and neck was performed using the standard protocol during bolus administration of intravenous contrast. Multiplanar CT image reconstructions and MIPs were obtained to evaluate the vascular anatomy. Carotid stenosis measurements (when applicable) are obtained utilizing NASCET criteria, using the distal internal carotid diameter as the denominator. RADIATION DOSE REDUCTION: This exam was performed according to the departmental dose-optimization program which includes automated exposure control, adjustment of the mA and/or kV according to patient size and/or use of iterative reconstruction technique. CONTRAST:  OMNIPAQUE IOHEXOL 350 MG/ML SOLN COMPARISON:  Same-day noncontrast CT head FINDINGS: CTA NECK FINDINGS Aortic arch: The imaged aortic arch is normal. The origins of the major branch vessels are patent. The subclavian arteries are patent to the  level imaged. Right carotid system: The right common carotid artery is patent. There is bulky calcified plaque in the proximal internal carotid artery resulting in up to approximately 70% stenosis. The distal internal carotid artery is widely patent. The external carotid artery is patent. There is no evidence of dissection or aneurysm. Left carotid system: The left common carotid artery is patent with mild focal plaque in the mid segment. There is mild plaque at the bifurcation and proximal internal carotid artery without hemodynamically significant stenosis or occlusion. The external carotid artery is patent. There is no evidence of dissection or aneurysm. Vertebral arteries: The vertebral  arteries are patent, without hemodynamically significant stenosis or occlusion. There is no evidence of dissection or aneurysm. Skeleton: There is no acute osseous abnormality or suspicious osseous lesion. There is no visible canal hematoma. Other neck: The soft tissues of the neck are unremarkable. Upper chest: There is mild subpleural fibrotic change in the lung apices. Review of the MIP images confirms the above findings CTA HEAD FINDINGS Anterior circulation: There is mild calcified plaque in the intracranial ICAs without significant stenosis or occlusion. The bilateral MCAs and ACAS are patent, without proximal stenosis or occlusion. The anterior communicating artery is normal There is no aneurysm or AVM. Posterior circulation: The bilateral V4 segments are patent. The basilar artery is patent. The major cerebellar arteries appear patent. The bilateral PCAs are patent, without proximal stenosis or occlusion. Small bilateral posterior communicating arteries are identified. There is no aneurysm or AVM. Venous sinuses: Patent. Anatomic variants: None. Review of the MIP images confirms the above findings IMPRESSION: 1. No emergent large vessel occlusion. 2. Bulky calcified plaque in the proximal right internal carotid artery resulting in approximately 70% stenosis. 3. Mild plaque in the left carotid system without hemodynamically significant stenosis or occlusion. 4. Patent intracranial vasculature. Electronically Signed   By: Lesia Hausen M.D.   On: 03/19/2023 17:12   CT Head Wo Contrast  Result Date: 03/19/2023 CLINICAL DATA:  Mental status change EXAM: CT HEAD WITHOUT CONTRAST TECHNIQUE: Contiguous axial images were obtained from the base of the skull through the vertex without intravenous contrast. RADIATION DOSE REDUCTION: This exam was performed according to the departmental dose-optimization program which includes automated exposure control, adjustment of the mA and/or kV according to patient size and/or  use of iterative reconstruction technique. COMPARISON:  02/24/2023 FINDINGS: Brain: No evidence of acute infarction, hemorrhage, extra-axial collection, ventriculomegaly, or mass effect. Generalized cerebral atrophy. Periventricular white matter low attenuation likely secondary to microangiopathy. Vascular: Cerebrovascular atherosclerotic calcifications are noted. Skull: Negative for fracture or focal lesion. Sinuses/Orbits: Visualized portions of the orbits are unremarkable. Visualized portions of the paranasal sinuses are unremarkable. Visualized portions of the mastoid air cells are unremarkable. Other: None. IMPRESSION: 1. No acute intracranial findings. 2. Chronic small vessel ischemic changes. Electronically Signed   By: Elige Ko M.D.   On: 03/19/2023 16:45    Pending Labs Unresulted Labs (From admission, onward)     Start     Ordered   03/20/23 0500  Basic metabolic panel  Tomorrow morning,   R        03/19/23 1832   03/20/23 0500  CBC  Tomorrow morning,   R        03/19/23 1832   03/19/23 1714  Lamotrigine level  Add-on,   AD        03/19/23 1713            Vitals/Pain Today's Vitals   03/19/23 1515 03/19/23  1615 03/19/23 1730 03/19/23 1800  BP: (!) 147/94 (!) 148/97 (!) 143/106 (!) 142/85  Pulse: 93 95 93 83  Resp: 16 16 20 18   Temp:      TempSrc:      SpO2: 97% 96% 97% 100%  PainSc:        Isolation Precautions No active isolations  Medications Medications  enoxaparin (LOVENOX) injection 40 mg (has no administration in time range)  lactated ringers infusion (has no administration in time range)  acetaminophen (TYLENOL) tablet 650 mg (has no administration in time range)    Or  acetaminophen (TYLENOL) suppository 650 mg (has no administration in time range)  docusate sodium (COLACE) capsule 100 mg (has no administration in time range)  polyethylene glycol (MIRALAX / GLYCOLAX) packet 17 g (has no administration in time range)  bisacodyl (DULCOLAX) EC tablet 5 mg  (has no administration in time range)  ondansetron (ZOFRAN) tablet 4 mg (has no administration in time range)    Or  ondansetron (ZOFRAN) injection 4 mg (has no administration in time range)  hydrALAZINE (APRESOLINE) injection 5 mg (has no administration in time range)  iohexol (OMNIPAQUE) 350 MG/ML injection 150 mL (150 mLs Intravenous Contrast Given 03/19/23 1618)    Mobility walks     Focused Assessments Neuro Assessment Handoff:  Swallow screen pass? Yes          Neuro Assessment:   Neuro Checks:      Has TPA been given? No If patient is a Neuro Trauma and patient is going to OR before floor call report to 4N Charge nurse: [phone] or [phone]   R Recommendations: See Admitting Provider Note  Report given to:   Additional Notes: axox4, family at bedside

## 2023-03-19 NOTE — H&P (Signed)
History and Physical    Patient: David [name]:[id number] DOB: 24-Jan-1956 DOA: 03/19/2023 DOS: the patient was seen and examined on 03/19/2023 PCP: Pcp, No  Patient coming from: Home - lives with wife; NOK: Wife, Zaydin Aughenbaugh, [phone]   Chief Complaint: AMS  HPI: David [name] is a 67 y.o. male with medical history significant of HTN, prior ETOH dependence, seizure d/o on Lamcital and anxiety depression presenting with AMS.  He was last admitted from 6/11-12 with similar presentation after medication misadventure.   He and his wife provided the history.  He is much better now.  He was confused this AM, got into the shower with his clothes on, legs too wobbly to support himself.  This seemed to occur fairly acutely after an uneventful morning, but he did take 5 Seroquel yesterday instead of the 2 that are prescribed.  He is mostly back to baseline and was able to ambulate with a walker around the ER.  After discussion we are in agreement that this is likely again related to medication misadventure and will observe overnight without further work-up with probable dc to home in AM.      ER Course:  Confused again.  Recent admission for the same, blamed on polypharmacy and this may be the same issue.  Normal last night, confused this AM.  Collapsed in shower, globally weak.  A&O but confused, tangential.  CTA negative.  Labs unremarkable, Hgb stable.  EEG, MRI pending.  May need psych consult.     Review of Systems: As mentioned in the history of present illness. All other systems reviewed and are negative. Past Medical History:  Diagnosis Date   ADD (attention deficit disorder)    Anemia    Microcytic   Arthritis    Bipolar disorder (HCC)    Depression    Dyspnea    history of no current issues 05/26/2019   GAD (generalized anxiety disorder)    History of kidney stones    passed   HTN (hypertension)    PTSD (post-traumatic stress disorder)    Skin cancer    Past Surgical  History:  Procedure Laterality Date   BACK SURGERY     Fusion - lumbar   HIP PINNING,CANNULATED Right 06/24/2018   Procedure: RIGHT CANNULATED HIP PINNING;  Surgeon: Terance Hart, MD;  Location: [facility];  Service: Orthopedics;  Laterality: Right;   LITHOTRIPSY     MULTIPLE EXTRACTIONS WITH ALVEOLOPLASTY Bilateral 05/03/2019   Procedure: MULTIPLE EXTRACTION TEETH NUMBER TWO, THREE, FIVE, SIX, SEVEN, EIGHT, NINE, TEN, ELEVEN, FIFTEEN, SIXTEEN, SEVENTEEN, NINETEEN, TWENTY, TWENTY-ONE, TWENTY-TWO, TWENTY-THREE, TWENTY-FOUR, TWENTY-FIVE, TWENTY-SIX, TWENTY-SEVEN, TWENTY-EIGHT, TWENTY-NINE, THIRTY-TWO WITH ALVEOLOPLASTY;  Surgeon: Ocie Doyne, DDS;  Location: [facility];  Service: Oral Surgery;  Laterality: Bilateral;   NOSE SURGERY     x3   TOTAL HIP ARTHROPLASTY Left 05/27/2019   Procedure: TOTAL HIP ARTHROPLASTY ANTERIOR APPROACH;  Surgeon: Jodi Geralds, MD;  Location: [facility];  Service: Orthopedics;  Laterality: Left;   Social History:  reports that he has been smoking cigarettes. He has a 40.00 pack-year smoking history. He has never used smokeless tobacco. He reports current alcohol use. He reports that he does not currently use drugs after having used the following drugs: Marijuana.  Allergies  Allergen Reactions   Other Other (See Comments)    Has eczema, so no strong detergents or dryer sheets   Codeine Nausea Only    Family History  Problem Relation Age of Onset   Arthritis Other  Heart disease Other    Cancer Other    Hypertension Other     Prior to Admission medications   Medication Sig Start Date End Date Taking? Authorizing Provider  cetaphil (CETAPHIL) lotion Apply 1 application topically as needed for dry skin.     [provider]  cloNIDine (CATAPRES) 0.2 MG tablet TAKE 1 TABLET(0.2 MG) BY MOUTH TWICE DAILY Patient taking differently: Take 0.2 mg by mouth 2 (two) times daily. 01/05/23   Cottle, Steva Ready., MD  FLUoxetine (PROZAC) 20 MG capsule Take 2  capsules (40 mg total) by mouth daily. 11/05/22   Cottle, Steva Ready., MD  hydrOXYzine (ATARAX) 25 MG tablet TAKE 1 TABLET BY MOUTH EVERY 6 HOURS AS NEEDED FOR ITCHING OR ANXIETY Patient taking differently: Take 25 mg by mouth every 6 (six) hours as needed for itching or anxiety. 01/27/23   Cottle, Steva Ready., MD  lamoTRIgine (LAMICTAL) 100 MG tablet Take 1 tablet (100 mg total) by mouth 2 (two) times daily. Appointment needed for further refills 01/26/23   Levert Feinstein, MD  metoprolol succinate (TOPROL-XL) 100 MG 24 hr tablet TAKE 1 TABLET BY MOUTH EVERY DAY 12/02/21   Cottle, Steva Ready., MD  modafinil (PROVIGIL) 200 MG tablet TAKE 1 TABLET BY MOUTH DAILY Patient taking differently: Take 200 mg by mouth daily. 02/10/23   Cottle, Steva Ready., MD  ondansetron (ZOFRAN) 8 MG tablet TAKE 1 TABLET(8 MG) BY MOUTH EVERY 8 HOURS AS NEEDED FOR NAUSEA OR VOMITING Patient taking differently: Take 8 mg by mouth every 8 (eight) hours as needed for nausea or vomiting. 01/27/23   Cottle, Steva Ready., MD  pregabalin (LYRICA) 150 MG capsule TAKE 1 CAPSULE(150 MG) BY MOUTH TWICE DAILY Patient taking differently: Take 150 mg by mouth 2 (two) times daily. 01/05/23   Cottle, Steva Ready., MD  QUEtiapine (SEROQUEL) 200 MG tablet TAKE 3 TABLETS(600 MG) BY MOUTH AT BEDTIME Patient taking differently: Take 400-600 mg by mouth at bedtime. 11/05/22   Cottle, Steva Ready., MD  RINVOQ 15 MG TB24 Take 15 mg by mouth daily.    [provider]  zolpidem (AMBIEN) 10 MG tablet TAKE 1 TO 1 AND 1/2 TABLETS BY MOUTH AT NIGHT AS NEEDED FOR SLEEP Patient taking differently: Take 10-15 mg by mouth at bedtime. 01/05/23   Cottle, Steva Ready., MD    Physical Exam: Vitals:   03/19/23 1800 03/19/23 1815 03/19/23 1905 03/19/23 1908  BP: (!) 142/85 115/87 (!) 126/93   Pulse: 83 84 79   Resp: 18 18 15    Temp:    98.4 F (36.9 C)  TempSrc:    Oral  SpO2: 100% 97% 99%    General:  Appears calm and comfortable and is in NAD Eyes:   EOMI,  normal lids, iris ENT:  grossly normal hearing, lips & tongue, mmm Neck:  no LAD, masses or thyromegaly Cardiovascular:  RRR, no m/r/g. No LE edema.  Respiratory:   CTA bilaterally with no wheezes/rales/rhonchi.  Normal respiratory effort. Abdomen:  soft, NT, ND Skin:  no rash or induration seen on limited exam Musculoskeletal:  grossly normal tone BUE/BLE, good ROM, no bony abnormality Psychiatric:  eccentric mood and affect, speech very fluent but mostly appropriate, AOx3 Neurologic:  CN 2-12 grossly intact, moves all extremities in coordinated fashion   Radiological Exams on Admission: Independently reviewed - see discussion in A/P where applicable  CT ANGIO HEAD NECK W WO CM  Result Date: 03/19/2023 CLINICAL  DATA:  Stroke suspected.  Mental status change. EXAM: CT ANGIOGRAPHY HEAD AND NECK WITH AND WITHOUT CONTRAST TECHNIQUE: Multidetector CT imaging of the head and neck was performed using the standard protocol during bolus administration of intravenous contrast. Multiplanar CT image reconstructions and MIPs were obtained to evaluate the vascular anatomy. Carotid stenosis measurements (when applicable) are obtained utilizing NASCET criteria, using the distal internal carotid diameter as the denominator. RADIATION DOSE REDUCTION: This exam was performed according to the departmental dose-optimization program which includes automated exposure control, adjustment of the mA and/or kV according to patient size and/or use of iterative reconstruction technique. CONTRAST:  OMNIPAQUE IOHEXOL 350 MG/ML SOLN COMPARISON:  Same-day noncontrast CT head FINDINGS: CTA NECK FINDINGS Aortic arch: The imaged aortic arch is normal. The origins of the major branch vessels are patent. The subclavian arteries are patent to the level imaged. Right carotid system: The right common carotid artery is patent. There is bulky calcified plaque in the proximal internal carotid artery resulting in up to approximately 70%  stenosis. The distal internal carotid artery is widely patent. The external carotid artery is patent. There is no evidence of dissection or aneurysm. Left carotid system: The left common carotid artery is patent with mild focal plaque in the mid segment. There is mild plaque at the bifurcation and proximal internal carotid artery without hemodynamically significant stenosis or occlusion. The external carotid artery is patent. There is no evidence of dissection or aneurysm. Vertebral arteries: The vertebral arteries are patent, without hemodynamically significant stenosis or occlusion. There is no evidence of dissection or aneurysm. Skeleton: There is no acute osseous abnormality or suspicious osseous lesion. There is no visible canal hematoma. Other neck: The soft tissues of the neck are unremarkable. Upper chest: There is mild subpleural fibrotic change in the lung apices. Review of the MIP images confirms the above findings CTA HEAD FINDINGS Anterior circulation: There is mild calcified plaque in the intracranial ICAs without significant stenosis or occlusion. The bilateral MCAs and ACAS are patent, without proximal stenosis or occlusion. The anterior communicating artery is normal There is no aneurysm or AVM. Posterior circulation: The bilateral V4 segments are patent. The basilar artery is patent. The major cerebellar arteries appear patent. The bilateral PCAs are patent, without proximal stenosis or occlusion. Small bilateral posterior communicating arteries are identified. There is no aneurysm or AVM. Venous sinuses: Patent. Anatomic variants: None. Review of the MIP images confirms the above findings IMPRESSION: 1. No emergent large vessel occlusion. 2. Bulky calcified plaque in the proximal right internal carotid artery resulting in approximately 70% stenosis. 3. Mild plaque in the left carotid system without hemodynamically significant stenosis or occlusion. 4. Patent intracranial vasculature.  Electronically Signed   By: Lesia Hausen M.D.   On: 03/19/2023 17:12   CT Head Wo Contrast  Result Date: 03/19/2023 CLINICAL DATA:  Mental status change EXAM: CT HEAD WITHOUT CONTRAST TECHNIQUE: Contiguous axial images were obtained from the base of the skull through the vertex without intravenous contrast. RADIATION DOSE REDUCTION: This exam was performed according to the departmental dose-optimization program which includes automated exposure control, adjustment of the mA and/or kV according to patient size and/or use of iterative reconstruction technique. COMPARISON:  02/24/2023 FINDINGS: Brain: No evidence of acute infarction, hemorrhage, extra-axial collection, ventriculomegaly, or mass effect. Generalized cerebral atrophy. Periventricular white matter low attenuation likely secondary to microangiopathy. Vascular: Cerebrovascular atherosclerotic calcifications are noted. Skull: Negative for fracture or focal lesion. Sinuses/Orbits: Visualized portions of the orbits are unremarkable. Visualized portions  of the paranasal sinuses are unremarkable. Visualized portions of the mastoid air cells are unremarkable. Other: None. IMPRESSION: 1. No acute intracranial findings. 2. Chronic small vessel ischemic changes. Electronically Signed   By: Elige Ko M.D.   On: 03/19/2023 16:45    EKG: Independently reviewed.  NSR with rate 91; no evidence of acute ischemia   Labs on Admission: I have personally reviewed the available labs and imaging studies at the time of the admission.  Pertinent labs:    Na++ 131 BUN 8/Creatinine 1.30/GFR >60 - at/near baseline WBC 12 Hgb 10.1 Platelets 407 UA WNL UDS negative ETOH <10   Assessment and Plan: Principal Problem:   Toxic encephalopathy Active Problems:   HTN (hypertension)   Episodic mood disorder (HCC)   DNR (do not resuscitate)    Acute toxic encephalopathy -Patient presenting with encephalopathy as evidenced by his confusion and generalized  weakness -This is similar to prior admission and likely also due to medication misadventure -He is returning to baseline without focal deficits -EEG, MRI considered but deferred for now given circumstances -Will observe on med surg overnight with probable dc to home in AM -PT/OT consults prior to dc -Needs outpatient referral to neurology and psychiatry for ongoing evaluation  Episodic mood d/o -h/o anxiety, depression, ADD -Consider weaning of chronic medications -For now, wife will assume management (this was discussed at time of last hospitalization but did not occur) -Continue fluoxetine, hydroxyzine, lamotrigine, pregabalin -Hold modafinil -Continue Seroquel at lower dose -Continue Ambien at lower dose  HTN -Continue clonidine  DNR -I have discussed code status with the patient and his wife and  they are in agreement that the patient would not desire resuscitation and would prefer to die a natural death should that situation arise.    Advance Care Planning:   Code Status: DNR   Consults: PT/OT; TOC team  DVT Prophylaxis: Lovenox  Family Communication: Wife was present throughout evaluation  Severity of Illness: The appropriate patient status for this patient is OBSERVATION. Observation status is judged to be reasonable and necessary in order to provide the required intensity of service to ensure the patient's safety. The patient's presenting symptoms, physical exam findings, and initial radiographic and laboratory data in the context of their medical condition is felt to place them at decreased risk for further clinical deterioration. Furthermore, it is anticipated that the patient will be medically stable for discharge from the hospital within 2 midnights of admission.   Author: Jonah Blue, MD 03/19/2023 7:10 PM  For on call review [email or web address].

## 2023-03-20 ENCOUNTER — Encounter (HOSPITAL_COMMUNITY): Payer: Self-pay | Admitting: Internal Medicine

## 2023-03-20 DIAGNOSIS — F39 Unspecified mood [affective] disorder: Secondary | ICD-10-CM

## 2023-03-20 DIAGNOSIS — I10 Essential (primary) hypertension: Secondary | ICD-10-CM

## 2023-03-20 DIAGNOSIS — G929 Unspecified toxic encephalopathy: Secondary | ICD-10-CM | POA: Diagnosis not present

## 2023-03-20 DIAGNOSIS — I6529 Occlusion and stenosis of unspecified carotid artery: Secondary | ICD-10-CM | POA: Diagnosis not present

## 2023-03-20 LAB — BASIC METABOLIC PANEL
Anion gap: 7 (ref 5–15)
BUN: 10 mg/dL (ref 8–23)
CO2: 27 mmol/L (ref 22–32)
Calcium: 8.9 mg/dL (ref 8.9–10.3)
Chloride: 102 mmol/L (ref 98–111)
Creatinine, Ser: 1.32 mg/dL — ABNORMAL HIGH (ref 0.61–1.24)
GFR, Estimated: 59 mL/min — ABNORMAL LOW (ref >60)
Glucose, Bld: 118 mg/dL — ABNORMAL HIGH (ref 70–99)
Potassium: 4.4 mmol/L (ref 3.5–5.1)
Sodium: 136 mmol/L (ref 135–145)

## 2023-03-20 LAB — CBC
HCT: 32.2 % — ABNORMAL LOW (ref 39.0–52.0)
Hemoglobin: 10.2 g/dL — ABNORMAL LOW (ref 13.0–17.0)
MCH: 26.5 pg (ref 26.0–34.0)
MCHC: 31.7 g/dL (ref 30.0–36.0)
MCV: 83.6 fL (ref 80.0–100.0)
Platelets: 406 10*3/uL — ABNORMAL HIGH (ref 150–400)
RBC: 3.85 MIL/uL — ABNORMAL LOW (ref 4.22–5.81)
RDW: 14.7 % (ref 11.5–15.5)
WBC: 8.4 10*3/uL (ref 4.0–10.5)
nRBC: 0 % (ref 0.0–0.2)

## 2023-03-20 MED ORDER — ASPIRIN 81 MG PO TBEC: 81.0000 mg | DELAYED_RELEASE_TABLET | Freq: Every day | ORAL | 2 refills | Status: DC

## 2023-03-20 NOTE — Evaluation (Signed)
Occupational Therapy Evaluation Patient Details Name: David [name] MRN: [id number] DOB: 1956-04-14 Today's Date: 03/20/2023   History of Present Illness Pt is a 67 y.o. male admitted for observation on 7/4 after presenting with confusion in the setting of using excessive Seroquel and other medications to fall asleep. Pt with PMH significant of HTN, anxiety, depression, PTSD, prior ETOH use, seizure disorder, and Bilateral Dupuytren's contracture (per pt and wife report).   Clinical Impression   At baseline, pt completes ADLs and functional transfers/mobility without an AD Independent to Mod I. At baseline, pt and wife report a history of pt taking medication incorrectly with wife stating she will now be assisting pt with all medication management. OT educated pt and wife in options to assist with managing medications, including use of a medication box with wife's assist for set-up/compliance or medication roll set up by pharmacy with wife's assist for compliance at home with pt and wife verbalizing understanding. Pt now presents with decreased activity tolerance, decreased balance during functional tasks, and decreased cognition with pt and wife stating pt is currently with 90% of baseline PLOF. Pt currently demonstrates ability to complete UB ADLs with Mod I to Supervision, LB ADLs with Supervision, and functional mobility/transfers without an AD with Supervision to Min guard assist. Pt currently requires occasional cues for safety and increased time for processing. Pt will benefit from acute skilled OT services to address deficits outlined below and to increase safety and independence with ADLs and functional mobility. Post acute discharge, no OT follow up is indicated at this time.      Recommendations for follow up therapy are one component of a multi-disciplinary discharge planning process, led by the attending physician.  Recommendations may be updated based on patient status, additional  functional criteria and insurance authorization.   Assistance Recommended at Discharge Intermittent Supervision/Assistance  Patient can return home with the following A little help with walking and/or transfers;A little help with bathing/dressing/bathroom;Assistance with cooking/housework;Direct supervision/assist for medications management;Direct supervision/assist for financial management;Assist for transportation;Help with stairs or ramp for entrance    Functional Status Assessment  Patient has had a recent decline in their functional status and demonstrates the ability to make significant improvements in function in a reasonable and predictable amount of time.  Equipment Recommendations  Tub/shower seat    Recommendations for Other Services       Precautions / Restrictions Precautions Precautions: Fall Precaution Comments: Hx of seizures Restrictions Weight Bearing Restrictions: No      Mobility Bed Mobility Overal bed mobility: Needs Assistance Bed Mobility: Supine to Sit, Sit to Supine     Supine to sit: Supervision Sit to supine: Supervision   General bed mobility comments: Supervision for safety.    Transfers Overall transfer level: Needs assistance Equipment used: None Transfers: Sit to/from Stand, Bed to chair/wheelchair/BSC Sit to Stand: Supervision, Min guard     Step pivot transfers: Supervision, Min guard     General transfer comment: Occasional cues needed for safety      Balance Overall balance assessment: Needs assistance Sitting-balance support: No upper extremity supported, Feet supported Sitting balance-Leahy Scale: Good     Standing balance support: No upper extremity supported, During functional activity Standing balance-Leahy Scale: Good Standing balance comment: Fair+ to Good                           ADL either performed or assessed with clinical judgement   ADL Overall ADL's : Needs  assistance/impaired Eating/Feeding:  Set up;Sitting (Assist with opening containers, at baseline)   Grooming: Modified independent;Sitting   Upper Body Bathing: Sitting;Supervision/ safety   Lower Body Bathing: Supervison/ safety;Sit to/from stand;Cueing for safety   Upper Body Dressing : Modified independent;Sitting   Lower Body Dressing: Supervision/safety;Sit to/from stand   Toilet Transfer: Supervision/safety;Min guard;Ambulation;Regular Toilet;Grab bars;Cueing for safety   Toileting- Clothing Manipulation and Hygiene: Supervision/safety;Sitting/lateral lean   Tub/ Shower Transfer: Supervision/safety;Min guard;Cueing for safety;Ambulation;Grab bars   Functional mobility during ADLs: Supervision/safety;Min guard;Cueing for safety General ADL Comments: Per pt and wife report, pt is within 90% of baseline. Supervision recommended at this time for increased safety.     Vision Baseline Vision/History: 1 Wears glasses (readers, distance) Ability to See in Adequate Light: 0 Adequate (with glasses) Patient Visual Report: No change from baseline       Perception     Praxis Praxis Praxis tested?: Within functional limits    Pertinent Vitals/Pain Pain Assessment Pain Assessment: No/denies pain     Hand Dominance Right   Extremity/Trunk Assessment Upper Extremity Assessment Upper Extremity Assessment: Overall WFL for tasks assessed;RUE deficits/detail;LUE deficits/detail RUE Deficits / Details: Pt with history of Bilateral Dupuytren's contracture worse on Right, at baseline. Pt and wife report no past hand therapy. OT educated pt and wife on availablity of outpatient hand therapy post acute disharge if pt is interested in participating and encouraged pt to discuss further with PCP. Pt and wife verbalize understanding and state they will discuss options with pt's PCP post acute discharge. RUE Coordination: decreased fine motor (mild, at baseline) LUE Deficits / Details: Pt with history of Bilateral Dupuytren's  contracture worse on Right, at baseline. LUE Coordination: decreased fine motor (mild, at baseline)   Lower Extremity Assessment Lower Extremity Assessment: Defer to PT evaluation   Cervical / Trunk Assessment Cervical / Trunk Assessment: Normal   Communication Communication Communication: No difficulties   Cognition Arousal/Alertness: Awake/alert Behavior During Therapy: WFL for tasks assessed/performed Overall Cognitive Status: Impaired/Different from baseline Area of Impairment: Problem solving, Safety/judgement, Memory                     Memory: Decreased short-term memory   Safety/Judgement: Decreased awareness of deficits, Decreased awareness of safety   Problem Solving: Slow processing General Comments: Wife reports cognition is ~90% back to baseline. Wife and pt report a history of pt taking medications incorrectly both accidentally and intentionally. OT educated pt in wife of use of pill box or medication roll set up by pharmacy to assist with medication management. Pt and wife report pill box did not work for pt in the past, but both are open to trial of medication role. OT instructed pt and wife to speak with their pharmacy about possibility of switching to a medication roll with both reporting understanding and agreement. Wife further states she will be assisting pt with medication management post acute discharge.     General Comments  VSS on RA throughout session. Per pt and wife report, pt is withing 90% of baseline PLOF.    Exercises     Shoulder Instructions      Home Living Family/patient expects to be discharged to:: Private residence Living Arrangements: Spouse/significant other Available Help at Discharge: Family;Available PRN/intermittently Type of Home: House Home Access: Stairs to enter [employer] of Steps: 5 Entrance Stairs-Rails: Right;Left Home Layout: One level     Bathroom Shower/Tub: Producer, television/film/video:  Standard     Home Equipment: [employer] -  single point;Rolling Walker (2 wheels);Wheelchair - manual          Prior Functioning/Environment Prior Level of Function : Independent/Modified Independent             Mobility Comments: independent with all mobility, reports just the one fall in the shower leading to this admission ADLs Comments: independent        OT Problem List: Decreased activity tolerance;Impaired balance (sitting and/or standing);Decreased safety awareness;Decreased cognition      OT Treatment/Interventions: Self-care/ADL training;Energy conservation;Therapeutic activities;Patient/family education;Balance training;Cognitive remediation/compensation    OT Goals(Current goals can be found in the care plan section) Acute Rehab OT Goals Patient Stated Goal: To return home and stay independent OT Goal Formulation: With patient/family Time For Goal Achievement: 04/03/23 Potential to Achieve Goals: Good ADL Goals Pt Will Perform Grooming: with modified independence;standing Pt Will Perform Lower Body Bathing: with modified independence;sit to/from stand Pt Will Transfer to Toilet: Independently;ambulating;regular height toilet Pt Will Perform Toileting - Clothing Manipulation and hygiene: Independently;sit to/from stand Additional ADL Goal #1: Patient will demonstrate ability to Independently state 3 energy conservation technqiues to increase safety and independence with ADLs and functional mobility in the home.  OT Frequency: Min 1X/week    Co-evaluation              AM-PAC OT "6 Clicks" Daily Activity     Outcome Measure Help from another person eating meals?: A Little Help from another person taking care of personal grooming?: None (in sitting) Help from another person toileting, which includes using toliet, bedpan, or urinal?: A Little Help from another person bathing (including washing, rinsing, drying)?: A Little Help from another person to put on and  taking off regular upper body clothing?: None Help from another person to put on and taking off regular lower body clothing?: A Little 6 Click Score: 20   End of Session Nurse Communication: Mobility status;Other (comment) (Pt sitting EOB to eat breakfast at end of session with wife present)  Activity Tolerance: Patient tolerated treatment well Patient left: in bed;with call bell/phone within reach;with family/visitor present;Other (comment) (Sitting EOB eating breakfast)  OT Visit Diagnosis: Unsteadiness on feet (R26.81);Other abnormalities of gait and mobility (R26.89)                Time: 1610-9604 OT Time Calculation (min): 32 min Charges:  OT General Charges $OT Visit: 1 Visit OT Evaluation $OT Eval Low Complexity: 1 Low OT Treatments $Self Care/Home Management : 8-22 mins  Frankey Botting "Orson Eva., OTR/L, MA Acute Rehab [phone]   Lendon Colonel 03/20/2023, 11:05 AM

## 2023-03-20 NOTE — Discharge Summary (Addendum)
PATIENT DETAILS Name: David [name] Age: 67 y.o. Sex: male Date of Birth: Aug 26, 1956 MRN: [id number]. Admitting Physician: Jonah Blue, MD PCP:Pcp, No  Admit Date: 03/19/2023 Discharge date: 03/20/2023  Recommendations for Outpatient Follow-up:  Follow up with PCP in 1-2 weeks Please obtain CMP/CBC in one week PCP/primary psychiatrist to see if we can slowly minimize some of his medications and avoid polypharmacy. Please refer patient to vascular surgery-see below.  Admitted From:  Home  Disposition: Home   Discharge Condition: good  CODE STATUS:   Code Status: DNR   Diet recommendation:  Diet Order             Diet general           Diet regular Room service appropriate? Yes; Fluid consistency: Thin  Diet effective now                    Brief Summary: [age]-year-old male with history of HTN, anxiety/depression/prior EtOH use/seizure disorder-presented with confusion-in the setting of using excessive Seroquel and other medications to fall asleep.  Brief Hospital Course: Acute toxic encephalopathy Secondary to polypharmacy-patient already on high doses of Seroquel and other medications-and apparently has been taking more of his Seroquel in order to try to fall asleep.  He also probably has been taking more Lyrica as well. He was manage supportive care-mentation has improved-he is almost back to his baseline-Long discussion with his spouse at bedside-she will buy a pillbox-and take control of his medications-and only dispense daily doses to the patient. Both patient/spouse aware of life-threatening/life disabling effects of polypharmacy. 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  Stable for discharge-other issues were stable during the short overnight hospitalization.   Discharge Diagnoses:  Principal Problem:   Toxic encephalopathy Active Problems:   HTN (hypertension)   Episodic mood disorder (HCC)   DNR (do not resuscitate)   Discharge Instructions:  Activity:  As tolerated with Full fall precautions use walker/cane & assistance as needed   Discharge Instructions     Call MD for:  extreme fatigue   Complete by: As directed    Call MD for:  persistant dizziness or light-headedness   Complete by: As directed    Call MD for:  persistant nausea and vomiting   Complete by: As directed    Diet general   Complete by: As directed    Discharge instructions   Complete by: As directed    Follow with Primary MD  in 1-2 weeks  Please get a complete blood count and chemistry panel checked by your Primary MD at your next visit, and again as instructed by your Primary MD.  Get Medicines reviewed and adjusted: Please take all your medications with you for your next visit with your Primary MD  Laboratory/radiological data: Please request your Primary MD to go over all hospital tests and procedure/radiological results at the follow up, please ask your Primary MD to get all Hospital records sent to his/her office.  In some cases, they will be blood work, cultures and biopsy results pending at the time of your discharge. Please request that your primary care M.D. follows up on these results.  Also Note the following: If you experience worsening of your admission symptoms, develop shortness of  breath, life threatening emergency, suicidal or homicidal thoughts you must seek medical attention immediately by calling 911 or calling your MD immediately  if symptoms less severe.  You must read complete instructions/literature along with all the possible adverse reactions/side effects for all the Medicines you take and that have been prescribed to you. Take any  new Medicines after you have completely understood and accpet all the possible adverse reactions/side effects.   Do not drive when taking Pain medications or sleeping medications (Benzodaizepines)  Do not take more than prescribed Pain, Sleep and Anxiety Medications. It is not advisable to combine anxiety,sleep and pain medications without talking with your primary care practitioner  Special Instructions: If you have smoked or chewed Tobacco  in the last 2 yrs please stop smoking, stop any regular Alcohol  and or any Recreational drug use.  Wear Seat belts while driving.  Please note: You were cared for by a hospitalist during your hospital stay. Once you are discharged, your primary care physician will handle any further medical issues. Please note that NO REFILLS for any discharge medications will be authorized once you are discharged, as it is imperative that you return to your primary care physician (or establish a relationship with a primary care physician if you do not have one) for your post hospital discharge needs so that they can reassess your need for medications and monitor your lab values.   Increase activity slowly   Complete by: As directed       Allergies as of 03/20/2023       Reactions   Other Other (See Comments)   Has eczema, so no strong detergents or dryer sheets   Codeine Nausea Only        Medication List     TAKE these medications    aspirin EC 81 MG tablet Take 1 tablet (81 mg total) by mouth daily. Swallow whole.   cloNIDine 0.2 MG tablet Commonly known as: CATAPRES TAKE 1 TABLET(0.2 MG) BY MOUTH TWICE DAILY   FLUoxetine 20 MG capsule Commonly known as: PROZAC Take 2 capsules (40 mg total) by mouth daily.   hydrOXYzine 25 MG tablet Commonly known as: ATARAX Take 25 mg by mouth every 6 (six) hours as needed for anxiety or itching.   lamoTRIgine 100 MG tablet Commonly known as: LAMICTAL Take 1 tablet (100 mg total) by mouth 2 (two) times daily.  Appointment needed for further refills   modafinil 200 MG tablet Commonly known as: PROVIGIL Take 200 mg by mouth daily.   ondansetron 8 MG tablet Commonly known as: ZOFRAN TAKE 1 TABLET(8 MG) BY MOUTH EVERY 8 HOURS AS NEEDED FOR NAUSEA OR VOMITING   pregabalin 150 MG capsule Commonly known as: LYRICA Take 150 mg by mouth 2 (two) times daily.   QUEtiapine 200 MG tablet Commonly known as: SEROQUEL TAKE 3 TABLETS(600 MG) BY MOUTH AT BEDTIME What changed:  how much to take how to take this when to take this additional instructions   Rinvoq 15 MG Tb24 Generic drug: Upadacitinib ER Take 15 mg by mouth daily.   zolpidem 10 MG tablet Commonly known as: AMBIEN TAKE 1 TO 1 AND 1/2 TABLETS BY MOUTH AT NIGHT AS NEEDED FOR SLEEP What changed: See the new instructions.        Follow-up Information     Primary care practitioner. Schedule an appointment as soon as possible for a visit in 1 week(s).          Maeola Harman, MD  Follow up.   Specialties: Vascular Surgery, Cardiology Why: Office will call with date/time, If you dont hear from them,please give them a call Contact information: [address] [place] [address] [phone]                Allergies  Allergen Reactions   Other Other (See Comments)    Has eczema, so no strong detergents or dryer sheets   Codeine Nausea Only     Other Procedures/Studies: CT ANGIO HEAD NECK W WO CM  Result Date: 03/19/2023 CLINICAL DATA:  Stroke suspected.  Mental status change. EXAM: CT ANGIOGRAPHY HEAD AND NECK WITH AND WITHOUT CONTRAST TECHNIQUE: Multidetector CT imaging of the head and neck was performed using the standard protocol during bolus administration of intravenous contrast. Multiplanar CT image reconstructions and MIPs were obtained to evaluate the vascular anatomy. Carotid stenosis measurements (when applicable) are obtained utilizing NASCET criteria, using the distal internal carotid diameter as  the denominator. RADIATION DOSE REDUCTION: This exam was performed according to the departmental dose-optimization program which includes automated exposure control, adjustment of the mA and/or kV according to patient size and/or use of iterative reconstruction technique. CONTRAST:  OMNIPAQUE IOHEXOL 350 MG/ML SOLN COMPARISON:  Same-day noncontrast CT head FINDINGS: CTA NECK FINDINGS Aortic arch: The imaged aortic arch is normal. The origins of the major branch vessels are patent. The subclavian arteries are patent to the level imaged. Right carotid system: The right common carotid artery is patent. There is bulky calcified plaque in the proximal internal carotid artery resulting in up to approximately 70% stenosis. The distal internal carotid artery is widely patent. The external carotid artery is patent. There is no evidence of dissection or aneurysm. Left carotid system: The left common carotid artery is patent with mild focal plaque in the mid segment. There is mild plaque at the bifurcation and proximal internal carotid artery without hemodynamically significant stenosis or occlusion. The external carotid artery is patent. There is no evidence of dissection or aneurysm. Vertebral arteries: The vertebral arteries are patent, without hemodynamically significant stenosis or occlusion. There is no evidence of dissection or aneurysm. Skeleton: There is no acute osseous abnormality or suspicious osseous lesion. There is no visible canal hematoma. Other neck: The soft tissues of the neck are unremarkable. Upper chest: There is mild subpleural fibrotic change in the lung apices. Review of the MIP images confirms the above findings CTA HEAD FINDINGS Anterior circulation: There is mild calcified plaque in the intracranial ICAs without significant stenosis or occlusion. The bilateral MCAs and ACAS are patent, without proximal stenosis or occlusion. The anterior communicating artery is normal There is no aneurysm or  AVM. Posterior circulation: The bilateral V4 segments are patent. The basilar artery is patent. The major cerebellar arteries appear patent. The bilateral PCAs are patent, without proximal stenosis or occlusion. Small bilateral posterior communicating arteries are identified. There is no aneurysm or AVM. Venous sinuses: Patent. Anatomic variants: None. Review of the MIP images confirms the above findings IMPRESSION: 1. No emergent large vessel occlusion. 2. Bulky calcified plaque in the proximal right internal carotid artery resulting in approximately 70% stenosis. 3. Mild plaque in the left carotid system without hemodynamically significant stenosis or occlusion. 4. Patent intracranial vasculature. Electronically Signed   By: Lesia Hausen M.D.   On: 03/19/2023 17:12   CT Head Wo Contrast  Result Date: 03/19/2023 CLINICAL DATA:  Mental status change EXAM: CT HEAD WITHOUT CONTRAST TECHNIQUE: Contiguous axial images were obtained from the base of the  skull through the vertex without intravenous contrast. RADIATION DOSE REDUCTION: This exam was performed according to the departmental dose-optimization program which includes automated exposure control, adjustment of the mA and/or kV according to patient size and/or use of iterative reconstruction technique. COMPARISON:  02/24/2023 FINDINGS: Brain: No evidence of acute infarction, hemorrhage, extra-axial collection, ventriculomegaly, or mass effect. Generalized cerebral atrophy. Periventricular white matter low attenuation likely secondary to microangiopathy. Vascular: Cerebrovascular atherosclerotic calcifications are noted. Skull: Negative for fracture or focal lesion. Sinuses/Orbits: Visualized portions of the orbits are unremarkable. Visualized portions of the paranasal sinuses are unremarkable. Visualized portions of the mastoid air cells are unremarkable. Other: None. IMPRESSION: 1. No acute intracranial findings. 2. Chronic small vessel ischemic changes.  Electronically Signed   By: Elige Ko M.D.   On: 03/19/2023 16:45   ECHOCARDIOGRAM COMPLETE  Result Date: 02/25/2023    ECHOCARDIOGRAM REPORT   Patient Name:   JAYMAR SCARPACI Date of Exam: 02/25/2023 Medical Rec #:  [id number]      Height:       68.0 in Accession #:    [id number]     Weight:       184.3 lb Date of Birth:  [birth year]      BSA:          1.974 m Patient Age:    66 years       BP:           99/65 mmHg Patient Gender: M              HR:           61 bpm. Exam Location:  Inpatient Procedure: 2D Echo, Cardiac Doppler and Color Doppler Indications:    Syncope R55  History:        Patient has prior history of Echocardiogram examinations, most                 recent 05/25/2020. Risk Factors:Hypertension.  Sonographer:    Lucendia Herrlich Referring Phys: [id number] CAROLE N HALL IMPRESSIONS  1. Left ventricular ejection fraction, by estimation, is 65 to 70%. The left ventricle has normal function. The left ventricle has no regional wall motion abnormalities. Left ventricular diastolic parameters are consistent with Grade I diastolic dysfunction (impaired relaxation).  2. Right ventricular systolic function is normal. The right ventricular size is normal. There is normal pulmonary artery systolic pressure.  3. Left atrial size was mildly dilated.  4. The mitral valve is normal in structure. No evidence of mitral valve regurgitation. No evidence of mitral stenosis.  5. The aortic valve is tricuspid. There is mild calcification of the aortic valve. Aortic valve regurgitation is not visualized. Aortic valve sclerosis is present, with no evidence of aortic valve stenosis.  6. The inferior vena cava is normal in size with <50% respiratory variability, suggesting right atrial pressure of 8 mmHg. Comparison(s): No significant change from prior study. FINDINGS  Left Ventricle: Left ventricular ejection fraction, by estimation, is 65 to 70%. The left ventricle has normal function. The left ventricle has no regional  wall motion abnormalities. The left ventricular internal cavity size was normal in size. There is  no left ventricular hypertrophy. Left ventricular diastolic parameters are consistent with Grade I diastolic dysfunction (impaired relaxation). Right Ventricle: The right ventricular size is normal. No increase in right ventricular wall thickness. Right ventricular systolic function is normal. There is normal pulmonary artery systolic pressure. The tricuspid regurgitant velocity is 2.26 m/s, and  with an assumed right atrial pressure  of 8 mmHg, the estimated right ventricular systolic pressure is 28.4 mmHg. Left Atrium: Left atrial size was mildly dilated. Right Atrium: Right atrial size was normal in size. Pericardium: Trivial pericardial effusion is present. The pericardial effusion is circumferential. Mitral Valve: The mitral valve is normal in structure. No evidence of mitral valve regurgitation. No evidence of mitral valve stenosis. Tricuspid Valve: The tricuspid valve is normal in structure. Tricuspid valve regurgitation is not demonstrated. No evidence of tricuspid stenosis. Aortic Valve: The aortic valve is tricuspid. There is mild calcification of the aortic valve. Aortic valve regurgitation is not visualized. Aortic valve sclerosis is present, with no evidence of aortic valve stenosis. Aortic valve peak gradient measures 12.4 mmHg. Pulmonic Valve: The pulmonic valve was not well visualized. Pulmonic valve regurgitation is not visualized. No evidence of pulmonic stenosis. Aorta: The aortic root and ascending aorta are structurally normal, with no evidence of dilitation. Venous: The inferior vena cava is normal in size with less than 50% respiratory variability, suggesting right atrial pressure of 8 mmHg. IAS/Shunts: No atrial level shunt detected by color flow Doppler.  LEFT VENTRICLE PLAX 2D LVIDd:         4.40 cm   Diastology LVIDs:         2.70 cm   LV e' medial:    7.40 cm/s LV PW:         0.90 cm   LV  E/e' medial:  11.4 LV IVS:        1.00 cm   LV e' lateral:   7.94 cm/s LVOT diam:     2.30 cm   LV E/e' lateral: 10.6 LV SV:         114 LV SV Index:   58 LVOT Area:     4.15 cm  RIGHT VENTRICLE             IVC RV S prime:     16.30 cm/s  IVC diam: 1.90 cm TAPSE (M-mode): 2.3 cm LEFT ATRIUM             Index        RIGHT ATRIUM           Index LA diam:        3.70 cm 1.87 cm/m   RA Area:     16.40 cm LA Vol (A2C):   60.5 ml 30.65 ml/m  RA Volume:   44.10 ml  22.34 ml/m LA Vol (A4C):   57.4 ml 29.08 ml/m LA Biplane Vol: 62.3 ml 31.56 ml/m  AORTIC VALVE AV Area (Vmax): 2.92 cm AV Vmax:        176.00 cm/s AV Peak Grad:   12.4 mmHg LVOT Vmax:      123.67 cm/s LVOT Vmean:     82.067 cm/s LVOT VTI:       0.275 m  AORTA Ao Root diam: 3.40 cm Ao Asc diam:  3.50 cm MITRAL VALVE               TRICUSPID VALVE MV Area (PHT): 3.77 cm    TR Peak grad:   20.4 mmHg MV Decel Time: 201 msec    TR Vmax:        226.00 cm/s MR Peak grad: 75.0 mmHg MR Vmax:      433.00 cm/s  SHUNTS MV E velocity: 84.40 cm/s  Systemic VTI:  0.28 m MV A velocity: 85.30 cm/s  Systemic Diam: 2.30 cm MV E/A ratio:  0.99 Riley Lam MD Electronically signed by  Riley Lam MD Signature Date/Time: 02/25/2023/11:56:35 AM    Final    CT CERVICAL SPINE WO CONTRAST  Result Date: 02/24/2023 CLINICAL DATA:  Unwitnessed fall EXAM: CT CERVICAL SPINE WITHOUT CONTRAST TECHNIQUE: Multidetector CT imaging of the cervical spine was performed without intravenous contrast. Multiplanar CT image reconstructions were also generated. RADIATION DOSE REDUCTION: This exam was performed according to the departmental dose-optimization program which includes automated exposure control, adjustment of the mA and/or kV according to patient size and/or use of iterative reconstruction technique. COMPARISON:  None Available. FINDINGS: Alignment: 3-4 mm anterolisthesis C3-4 and C4-5 is degenerative in nature. Mild reversal of the normal cervical lordosis. Skull  base and vertebrae: Imaging is slightly limited by motion artifact. Craniocervical alignment is normal. The atlantodental interval is not widened. No acute fracture of the cervical spine. Vertebral body height is preserved. There is ankylosis of the vertebral bodies and facet joints of C2-C4. Soft tissues and spinal canal: No prevertebral fluid or swelling. No visible canal hematoma. Disc levels: There is disc space narrowing and endplate remodeling throughout the cervical spine, most severe at C5-C7 in keeping with changes of advanced degenerative disc disease. No high-grade canal stenosis hypertrophic arthropathy results in high-grade neuroforaminal narrowing bilaterally at C3-4 and C4-5. Upper chest: Excluded Other: None IMPRESSION: 1. No acute fracture or listhesis of the cervical spine. 2. 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 Electronically Signed   By: Helyn Numbers M.D.   On: 02/24/2023 19:31   CT HEAD WO CONTRAST  Result Date: 02/24/2023 CLINICAL DATA:  Blunt trauma. EXAM: CT HEAD WITHOUT CONTRAST TECHNIQUE: Contiguous axial images were obtained from the base of the skull through the vertex without intravenous contrast. RADIATION DOSE REDUCTION: This exam was performed according to the departmental dose-optimization program which includes automated exposure control, adjustment of the mA and/or kV according to patient size and/or use of iterative reconstruction technique. COMPARISON:  Head CT 09/23/2021 FINDINGS: Brain: No intracranial hemorrhage, mass effect, or midline shift. No hydrocephalus. The basilar cisterns are patent. No evidence of territorial infarct or acute ischemia. No extra-axial or intracranial fluid collection. Vascular: Atherosclerosis of skullbase vasculature without hyperdense vessel or abnormal calcification. Skull: No fracture or focal lesion. Sinuses/Orbits: No acute  finding. Other: No significant scalp hematoma. IMPRESSION: No acute intracranial abnormality. No skull fracture. Electronically Signed   By: Narda Rutherford M.D.   On: 02/24/2023 19:21   DG Chest Portable 1 View  Result Date: 02/24/2023 CLINICAL DATA:  Altered mental status EXAM: PORTABLE CHEST 1 VIEW COMPARISON:  Chest radiograph dated 05/25/2020 FINDINGS: Low lung volumes with bronchovascular crowding. Bibasilar patchy and right basilar linear opacities. No pleural effusion or pneumothorax. The heart size and mediastinal contours are within normal limits. No acute osseous abnormality. IMPRESSION: Low lung volumes with bibasilar patchy and right basilar linear opacities, likely atelectasis. Aspiration or pneumonia can be considered in the appropriate clinical setting. Electronically Signed   By: Agustin Cree M.D.   On: 02/24/2023 19:18     TODAY-DAY OF DISCHARGE:  Subjective:   Piedad Climes today has no headache,no chest abdominal pain,no new weakness tingling or numbness, feels much better wants to go home today.   Objective:   Blood pressure 133/81, pulse 77, temperature 98.2 F (36.8 C), temperature source Oral, resp. rate 18, SpO2 94 %.  Intake/Output Summary (Last 24 hours) at 03/20/2023 1000 Last data filed at 03/20/2023 0700 Gross per 24 hour  Intake 870.23 ml  Output --  Net 870.23 ml   There were no vitals filed for this visit.  Exam: Awake Alert, Oriented *3, No new F.N deficits, Normal affect [place].AT,PERRAL Supple Neck,No JVD, No cervical lymphadenopathy appriciated.  Symmetrical Chest wall movement, Good air movement bilaterally, CTAB RRR,No Gallops,Rubs or new Murmurs, No Parasternal Heave +ve B.Sounds, Abd Soft, Non tender, No organomegaly appriciated, No rebound -guarding or rigidity. No Cyanosis, Clubbing or edema, No new Rash or bruise   PERTINENT RADIOLOGIC STUDIES: CT ANGIO HEAD NECK W WO CM  Result Date: 03/19/2023 CLINICAL DATA:  Stroke suspected.  Mental status  change. EXAM: CT ANGIOGRAPHY HEAD AND NECK WITH AND WITHOUT CONTRAST TECHNIQUE: Multidetector CT imaging of the head and neck was performed using the standard protocol during bolus administration of intravenous contrast. Multiplanar CT image reconstructions and MIPs were obtained to evaluate the vascular anatomy. Carotid stenosis measurements (when applicable) are obtained utilizing NASCET criteria, using the distal internal carotid diameter as the denominator. RADIATION DOSE REDUCTION: This exam was performed according to the departmental dose-optimization program which includes automated exposure control, adjustment of the mA and/or kV according to patient size and/or use of iterative reconstruction technique. CONTRAST:  OMNIPAQUE IOHEXOL 350 MG/ML SOLN COMPARISON:  Same-day noncontrast CT head FINDINGS: CTA NECK FINDINGS Aortic arch: The imaged aortic arch is normal. The origins of the major branch vessels are patent. The subclavian arteries are patent to the level imaged. Right carotid system: The right common carotid artery is patent. There is bulky calcified plaque in the proximal internal carotid artery resulting in up to approximately 70% stenosis. The distal internal carotid artery is widely patent. The external carotid artery is patent. There is no evidence of dissection or aneurysm. Left carotid system: The left common carotid artery is patent with mild focal plaque in the mid segment. There is mild plaque at the bifurcation and proximal internal carotid artery without hemodynamically significant stenosis or occlusion. The external carotid artery is patent. There is no evidence of dissection or aneurysm. Vertebral arteries: The vertebral arteries are patent, without hemodynamically significant stenosis or occlusion. There is no evidence of dissection or aneurysm. Skeleton: There is no acute osseous abnormality or suspicious osseous lesion. There is no visible canal hematoma. Other neck: The soft  tissues of the neck are unremarkable. Upper chest: There is mild subpleural fibrotic change in the lung apices. Review of the MIP images confirms the above findings CTA HEAD FINDINGS Anterior circulation: There is mild calcified plaque in the intracranial ICAs without significant stenosis or occlusion. The bilateral MCAs and ACAS are patent, without proximal stenosis or occlusion. The anterior communicating artery is normal There is no aneurysm or AVM. Posterior circulation: The bilateral V4 segments are patent. The basilar artery is patent. The major cerebellar arteries appear patent. The bilateral PCAs are patent, without proximal stenosis or occlusion. Small bilateral posterior communicating arteries are identified. There is no aneurysm or AVM. Venous sinuses: Patent. Anatomic variants: None. Review of the MIP images confirms the above findings IMPRESSION: 1. No emergent large vessel occlusion. 2. Bulky calcified plaque in the proximal right internal carotid artery resulting in approximately 70% stenosis. 3. Mild plaque in the left carotid system without hemodynamically significant stenosis or occlusion. 4. Patent intracranial vasculature. Electronically Signed   By: Lesia Hausen M.D.   On: 03/19/2023 17:12   CT Head Wo Contrast  Result Date: 03/19/2023 CLINICAL DATA:  Mental status change EXAM: CT HEAD WITHOUT CONTRAST TECHNIQUE: Contiguous axial images were obtained from the base  of the skull through the vertex without intravenous contrast. RADIATION DOSE REDUCTION: This exam was performed according to the departmental dose-optimization program which includes automated exposure control, adjustment of the mA and/or kV according to patient size and/or use of iterative reconstruction technique. COMPARISON:  02/24/2023 FINDINGS: Brain: No evidence of acute infarction, hemorrhage, extra-axial collection, ventriculomegaly, or mass effect. Generalized cerebral atrophy. Periventricular white matter low attenuation  likely secondary to microangiopathy. Vascular: Cerebrovascular atherosclerotic calcifications are noted. Skull: Negative for fracture or focal lesion. Sinuses/Orbits: Visualized portions of the orbits are unremarkable. Visualized portions of the paranasal sinuses are unremarkable. Visualized portions of the mastoid air cells are unremarkable. Other: None. IMPRESSION: 1. No acute intracranial findings. 2. Chronic small vessel ischemic changes. Electronically Signed   By: Elige Ko M.D.   On: 03/19/2023 16:45     PERTINENT LAB RESULTS: CBC: Recent Labs    03/19/23 1515 03/19/23 1620 03/20/23 0443  WBC 12.0*  --  8.4  HGB 10.1* 9.5* 10.2*  HCT 31.1* 28.0* 32.2*  PLT 407*  --  406*   CMET CMP     Component Value Date/Time   NA 136 03/20/2023 0443   NA 139 03/17/2022 0923   K 4.4 03/20/2023 0443   CL 102 03/20/2023 0443   CO2 27 03/20/2023 0443   GLUCOSE 118 (H) 03/20/2023 0443   BUN 10 03/20/2023 0443   BUN 14 03/17/2022 0923   CREATININE 1.32 (H) 03/20/2023 0443   CREATININE TEST NOT PERFORMED 09/03/2012 1527   CALCIUM 8.9 03/20/2023 0443   PROT 7.0 03/19/2023 1515   PROT 6.7 03/17/2022 0923   ALBUMIN 3.8 03/19/2023 1515   ALBUMIN 4.3 03/17/2022 0923   AST 20 03/19/2023 1515   ALT 16 03/19/2023 1515   ALKPHOS 107 03/19/2023 1515   BILITOT 0.2 (L) 03/19/2023 1515   BILITOT <0.2 03/17/2022 0923   EGFR 68 03/17/2022 0923   GFRNONAA 59 (L) 03/20/2023 0443   GFRNONAA TEST NOT PERFORMED 09/03/2012 1527    GFR CrCl cannot be calculated (Unknown ideal weight.). No results for input(s): "LIPASE", "AMYLASE" in the last 72 hours. No results for input(s): "CKTOTAL", "CKMB", "CKMBINDEX", "TROPONINI" in the last 72 hours. Invalid input(s): "POCBNP" No results for input(s): "DDIMER" in the last 72 hours. No results for input(s): "HGBA1C" in the last 72 hours. No results for input(s): "CHOL", "HDL", "LDLCALC", "TRIG", "CHOLHDL", "LDLDIRECT" in the last 72 hours. No results for  input(s): "TSH", "T4TOTAL", "T3FREE", "THYROIDAB" in the last 72 hours.  Invalid input(s): "FREET3" No results for input(s): "VITAMINB12", "FOLATE", "FERRITIN", "TIBC", "IRON", "RETICCTPCT" in the last 72 hours. Coags: No results for input(s): "INR" in the last 72 hours.  Invalid input(s): "PT" Microbiology: No results found for this or any previous visit (from the past 240 hour(s)).  FURTHER DISCHARGE INSTRUCTIONS:  Get Medicines reviewed and adjusted: Please take all your medications with you for your next visit with your Primary MD  Laboratory/radiological data: Please request your Primary MD to go over all hospital tests and procedure/radiological results at the follow up, please ask your Primary MD to get all Hospital records sent to his/her office.  In some cases, they will be blood work, cultures and biopsy results pending at the time of your discharge. Please request that your primary care M.D. goes through all the records of your hospital data and follows up on these results.  Also Note the following: If you experience worsening of your admission symptoms, develop shortness of breath, life threatening emergency, suicidal or homicidal  thoughts you must seek medical attention immediately by calling 911 or calling your MD immediately  if symptoms less severe.  You must read complete instructions/literature along with all the possible adverse reactions/side effects for all the Medicines you take and that have been prescribed to you. Take any new Medicines after you have completely understood and accpet all the possible adverse reactions/side effects.   Do not drive when taking Pain medications or sleeping medications (Benzodaizepines)  Do not take more than prescribed Pain, Sleep and Anxiety Medications. It is not advisable to combine anxiety,sleep and pain medications without talking with your primary care practitioner  Special Instructions: If you have smoked or chewed Tobacco   in the last 2 yrs please stop smoking, stop any regular Alcohol  and or any Recreational drug use.  Wear Seat belts while driving.  Please note: You were cared for by a hospitalist during your hospital stay. Once you are discharged, your primary care physician will handle any further medical issues. Please note that NO REFILLS for any discharge medications will be authorized once you are discharged, as it is imperative that you return to your primary care physician (or establish a relationship with a primary care physician if you do not have one) for your post hospital discharge needs so that they can reassess your need for medications and monitor your lab values.  Total Time spent coordinating discharge including counseling, education and face to face time equals greater than 30 minutes.  Signed: Quetzalli Clos 03/20/2023 10:00 AM

## 2023-03-20 NOTE — TOC Transition Note (Signed)
Transition of Care [facility]) - CM/SW Discharge Note   Patient Details  Name: David [name] MRN: [id number] Date of Birth: 09/27/1955  [facility]) CM/SW Contact:  Gordy Clement, RN Phone Number: 03/20/2023, 10:12 AM   Clinical Narrative:     Patient will dc to home with Wife  No HH has been recommended  Wife will order shower chair online         Patient Goals and CMS Choice      Discharge Placement                         Discharge Plan and Services Additional resources added to the After Visit Summary for                                       Social Determinants of Health (SDOH) Interventions SDOH Screenings   Food Insecurity: No Food Insecurity (03/19/2023)  Housing: Patient Declined (03/19/2023)  Recent Concern: Housing - High Risk (02/24/2023)  Transportation Needs: No Transportation Needs (03/19/2023)  Utilities: Not At Risk (03/19/2023)  Financial Resource Strain: Medium Risk (06/23/2018)  Physical Activity: Insufficiently Active (06/23/2018)  Social Connections: Moderately Integrated (06/23/2018)  Stress: Unknown (06/23/2018)  Tobacco Use: High Risk (03/20/2023)     Readmission Risk Interventions     No data to display

## 2023-03-20 NOTE — TOC CM/SW Note (Signed)
Transition of Care [facility]) - Inpatient Brief Assessment   Patient Details  Name: David [name] MRN: [id number] Date of Birth: [birth year]  [facility]) CM/SW Contact:    Gordy Clement, RN Phone Number: 03/20/2023, 9:41 AM   Clinical Narrative:  Patient is from home with Wife. Patient has no PCP listed.  CM spoke with Wife and they are agreeable to a new PCP . CM has requested PCP.  Wife also inquired about a shower chair.  After being told that it would be a pay out of pocket expense ( insurance will not cover) Wife decided she would order on [employer].   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

## 2023-03-20 NOTE — Plan of Care (Signed)

## 2023-03-20 NOTE — Progress Notes (Signed)
AVS reviewed with pt and his wife at bedside. All questions answered by this Clinical research associate, primary RN Leavy Cella. Pt in NAD at this time, and will be leaving via private vehicle with his wife.

## 2023-03-20 NOTE — Evaluation (Signed)
Physical Therapy Evaluation Patient Details Name: David [name] MRN: [id number] DOB: 10/12/1955 Today's Date: 03/20/2023  History of Present Illness  Pt is a 67 y.o. male admitted for observation on 7/4 after presenting with confusion in the setting of using excessive Seroquel and other medications to fall asleep. Pt with PMH significant of HTN, anxiety, depression, PTSD, prior ETOH use, seizure disorder, and Bilateral Dupuytren's contracture (per pt and wife report).  Clinical Impression  Pt presents today after AMS, with cognition improved but not back to baseline per wife report. Pt is independent with all mobility at baseline, currently requiring supervision for safety with intermittent minG with ambulation due to path deviation, improving with continued ambulation today. Anticipate once cognition returns to baseline, pt will have no PT needs, none recommended at discharge. Pt's wife will be around the first few days at home to provide supervision for safety. Acute PT will sing off at this time.       Assistance Recommended at Discharge Frequent or constant Supervision/Assistance  If plan is discharge home, recommend the following:  Can travel by private vehicle  A little help with walking and/or transfers;Direct supervision/assist for medications management;Direct supervision/assist for financial management;Help with stairs or ramp for entrance        Equipment Recommendations Other (comment) (shower chair)  Recommendations for Other Services       Functional Status Assessment Patient has had a recent decline in their functional status and demonstrates the ability to make significant improvements in function in a reasonable and predictable amount of time.     Precautions / Restrictions Precautions Precautions: Fall Precaution Comments: Hx of seizures Restrictions Weight Bearing Restrictions: No      Mobility  Bed Mobility Overal bed mobility: Needs Assistance Bed Mobility:  Supine to Sit, Sit to Supine     Supine to sit: Supervision, HOB elevated Sit to supine: Supervision, HOB elevated   General bed mobility comments: supervision for safety    Transfers Overall transfer level: Needs assistance Equipment used: None Transfers: Sit to/from Stand Sit to Stand: Supervision           General transfer comment: supervision for safety    Ambulation/Gait Ambulation/Gait assistance: Supervision, Min guard Gait Distance (Feet): 225 Feet Assistive device: None Gait Pattern/deviations: Step-through pattern, Decreased stride length, Drifts right/left Gait velocity: grossly WFL     General Gait Details: supervision with intermittent minG due to path deviation. Able to manage around obstalces but close supervision needed for safety  Stairs Stairs: Yes Stairs assistance: Min guard Stair Management: One rail Right, Alternating pattern, Forwards Number of Stairs: 4 General stair comments: minG for safety and balance  Wheelchair Mobility     Tilt Bed    Modified Rankin (Stroke Patients Only)       Balance Overall balance assessment: Needs assistance Sitting-balance support: No upper extremity supported, Feet supported Sitting balance-Leahy Scale: Good     Standing balance support: No upper extremity supported, During functional activity Standing balance-Leahy Scale: Fair Standing balance comment: close guard for safety with ambulation, improving with continued ambulation                             Pertinent Vitals/Pain Pain Assessment Pain Assessment: No/denies pain    Home Living Family/patient expects to be discharged to:: Private residence Living Arrangements: Spouse/significant other Available Help at Discharge: Family;Available PRN/intermittently Type of Home: House Home Access: Stairs to enter Entrance Stairs-Rails: Doctor, general practice of  Steps: 5   Home Layout: One level Home Equipment: Cane -  single point;Rolling Walker (2 wheels);Wheelchair - manual      Prior Function Prior Level of Function : Independent/Modified Independent             Mobility Comments: independent with all mobility, reports just the one fall in the shower leading to this admission ADLs Comments: independent     Hand Dominance   Dominant Hand: Right    Extremity/Trunk Assessment   Upper Extremity Assessment Upper Extremity Assessment: Defer to OT evaluation    Lower Extremity Assessment Lower Extremity Assessment: Overall WFL for tasks assessed    Cervical / Trunk Assessment Cervical / Trunk Assessment: Normal  Communication   Communication: No difficulties  Cognition Arousal/Alertness: Awake/alert Behavior During Therapy: Impulsive Overall Cognitive Status: Impaired/Different from baseline Area of Impairment: Problem solving, Safety/judgement, Memory                     Memory: Decreased short-term memory   Safety/Judgement: Decreased awareness of deficits, Decreased awareness of safety   Problem Solving: Slow processing General Comments: wife reports cognition is about 90% back to baseline. Pt mildly impulsive with tangential conversation but easily redirectable. Poor awareness of safety requiring mild cueing        General Comments General comments (skin integrity, edema, etc.): VSS on room air    Exercises     Assessment/Plan    PT Assessment Patient does not need any further PT services  PT Problem List         PT Treatment Interventions      PT Goals (Current goals can be found in the Care Plan section)  Acute Rehab PT Goals Patient Stated Goal: go home PT Goal Formulation: All assessment and education complete, DC therapy    Frequency       Co-evaluation               AM-PAC PT "6 Clicks" Mobility  Outcome Measure Help needed turning from your back to your side while in a flat bed without using bedrails?: A Little Help needed moving from  lying on your back to sitting on the side of a flat bed without using bedrails?: A Little Help needed moving to and from a bed to a chair (including a wheelchair)?: A Little Help needed standing up from a chair using your arms (e.g., wheelchair or bedside chair)?: A Little Help needed to walk in hospital room?: A Little Help needed climbing 3-5 steps with a railing? : A Little 6 Click Score: 18    End of Session Equipment Utilized During Treatment: Gait belt Activity Tolerance: Patient tolerated treatment well Patient left: in bed;with call bell/phone within reach;with bed alarm set;with family/visitor present Nurse Communication: Mobility status PT Visit Diagnosis: Other abnormalities of gait and mobility (R26.89)    Time: 9147-8295 PT Time Calculation (min) (ACUTE ONLY): 23 min   Charges:   PT Evaluation $PT Eval Moderate Complexity: 1 Mod   PT General Charges $$ ACUTE PT VISIT: 1 Visit         Lindalou Hose, PT DPT Acute Rehabilitation Services Office [phone]   Leonie Man 03/20/2023, 1:45 PM

## 2023-03-23 ENCOUNTER — Telehealth: Payer: Self-pay | Admitting: Vascular Surgery

## 2023-03-23 NOTE — Telephone Encounter (Signed)
-----   Message from Maeola Harman, MD sent at 03/20/2023 10:33 AM EDT ----- David [name] [id number] 10-Jan-1956  Please have this patient see me in 4 to 6 weeks with carotid duplex.  Diagnosis carotid stenosis.  Referring physician Dr. Jeoffrey Massed.  bc

## 2023-03-24 LAB — LAMOTRIGINE LEVEL: Lamotrigine Lvl: 4.4 ug/mL (ref 2.0–20.0)

## 2023-03-31 ENCOUNTER — Ambulatory Visit (INDEPENDENT_AMBULATORY_CARE_PROVIDER_SITE_OTHER): Payer: Medicare HMO | Admitting: Student

## 2023-03-31 ENCOUNTER — Encounter: Payer: Self-pay | Admitting: Student

## 2023-03-31 VITALS — BP 160/90 | HR 76 | Temp 98.2°F | Wt 175.9 lb

## 2023-03-31 DIAGNOSIS — F17210 Nicotine dependence, cigarettes, uncomplicated: Secondary | ICD-10-CM

## 2023-03-31 DIAGNOSIS — R110 Nausea: Secondary | ICD-10-CM | POA: Diagnosis not present

## 2023-03-31 DIAGNOSIS — Z7689 Persons encountering health services in other specified circumstances: Secondary | ICD-10-CM | POA: Insufficient documentation

## 2023-03-31 DIAGNOSIS — Z0000 Encounter for general adult medical examination without abnormal findings: Secondary | ICD-10-CM | POA: Insufficient documentation

## 2023-03-31 DIAGNOSIS — F17200 Nicotine dependence, unspecified, uncomplicated: Secondary | ICD-10-CM | POA: Insufficient documentation

## 2023-03-31 DIAGNOSIS — F1014 Alcohol abuse with alcohol-induced mood disorder: Secondary | ICD-10-CM

## 2023-03-31 DIAGNOSIS — R112 Nausea with vomiting, unspecified: Secondary | ICD-10-CM | POA: Diagnosis present

## 2023-03-31 DIAGNOSIS — Z139 Encounter for screening, unspecified: Secondary | ICD-10-CM

## 2023-03-31 MED ORDER — PANTOPRAZOLE SODIUM 40 MG PO TBEC
40.0000 mg | DELAYED_RELEASE_TABLET | Freq: Every day | ORAL | 3 refills | Status: DC
Start: 2023-03-31 — End: 2023-06-04

## 2023-03-31 NOTE — Assessment & Plan Note (Signed)
Patient has a long history  cigarette smoking , smokes about 1 pack a day since he was 15.  Patient reports trying nicotine patch in the past which did not help.  Tried Chantix but said it makes him crazy so he did not want to try it anymore.  Smoking cessation resources provided to the patient and complications of nicotine adequately discussed  during this visit.  Continue to assess pt's readiness to quit during our next visit

## 2023-03-31 NOTE — Assessment & Plan Note (Signed)
>>  ASSESSMENT AND PLAN FOR NAUSEA WRITTEN ON 03/31/2023  4:38 PM BY Kathleen Lime, MD  Patient reports a 3-day history of nausea with no associated vomiting or diarrhea, says he has episodic diffuse abdominal cramp with the nausea.  Patient denies any travel outside the [place].  Patient requested Zofran because it's helped him in the past however considering his multiple SSRIs medications, adding Zofran which is also a serotonergic agent at this time would not be medically appropriate, especially with his hx of Seroquel overdose .Considering differentials of GERD ,peptic ulcer disease, gastroparesis and H. pylori at this time. -H. pylori breath test -Pantoprazole 40 mg daily -Follow-up in a month

## 2023-03-31 NOTE — Progress Notes (Signed)
CC: Hospital follow-up and to establish care  HPI:  [name] is a 67 y.o. male living with a history stated below and presents today for hospital follow-up and to establish care. Please see problem based assessment and plan for additional details.  Past Medical History:  Diagnosis Date   ADD (attention deficit disorder)    Anemia    Microcytic   Arthritis    Bipolar disorder (HCC)    Depression    Dyspnea    history of no current issues 05/26/2019   GAD (generalized anxiety disorder)    History of kidney stones    passed   HTN (hypertension)    PTSD (post-traumatic stress disorder)    Skin cancer     Current Outpatient Medications on File Prior to Visit  Medication Sig Dispense Refill   aspirin EC 81 MG tablet Take 1 tablet (81 mg total) by mouth daily. Swallow whole. 150 tablet 2   cloNIDine (CATAPRES) 0.2 MG tablet TAKE 1 TABLET(0.2 MG) BY MOUTH TWICE DAILY 180 tablet 0   FLUoxetine (PROZAC) 20 MG capsule Take 2 capsules (40 mg total) by mouth daily. 180 capsule 1   hydrOXYzine (ATARAX) 25 MG tablet Take 25 mg by mouth every 6 (six) hours as needed for anxiety or itching.     lamoTRIgine (LAMICTAL) 100 MG tablet Take 1 tablet (100 mg total) by mouth 2 (two) times daily. Appointment needed for further refills 60 tablet 11   modafinil (PROVIGIL) 200 MG tablet Take 200 mg by mouth daily.     ondansetron (ZOFRAN) 8 MG tablet TAKE 1 TABLET(8 MG) BY MOUTH EVERY 8 HOURS AS NEEDED FOR NAUSEA OR VOMITING 20 tablet 0   pregabalin (LYRICA) 150 MG capsule Take 150 mg by mouth 2 (two) times daily.     QUEtiapine (SEROQUEL) 200 MG tablet TAKE 3 TABLETS(600 MG) BY MOUTH AT BEDTIME (Patient taking differently: Take 400 mg by mouth at bedtime.) 270 tablet 0   RINVOQ 15 MG TB24 Take 15 mg by mouth daily.     zolpidem (AMBIEN) 10 MG tablet TAKE 1 TO 1 AND 1/2 TABLETS BY MOUTH AT NIGHT AS NEEDED FOR SLEEP (Patient taking differently: Take 5-10 mg by mouth See admin instructions. 10 mg  nightly at bedtime. May take an additional 5 mg x3 if still unable to fall asleep or wakes up during the night.) 45 tablet 2   No current facility-administered medications on file prior to visit.    Family History  Problem Relation Age of Onset   Arthritis Other    Heart disease Other    Cancer Other    Hypertension Other     Social History   Socioeconomic History   Marital status: Married    Spouse name: Not on file   Number of children: Not on file   Years of education: Not on file   Highest education level: Not on file  Occupational History   Not on file  Tobacco Use   Smoking status: Every Day    Current packs/day: 0.00    Average packs/day: 1 pack/day for 40.0 years (40.0 ttl pk-yrs)    Types: Cigarettes    Start date: 04/25/1979    Last attempt to quit: 04/25/2019    Years since quitting: 3.9   Smokeless tobacco: Never  Vaping Use   Vaping status: Never Used  Substance and Sexual Activity   Alcohol use: Yes    Comment: daily-6-7/day   Drug use: Not Currently  Types: Marijuana   Sexual activity: Not on file  Other Topics Concern   Not on file  Social History Narrative   Not on file   Social Determinants of Health   Financial Resource Strain: Medium Risk (06/23/2018)   Overall Financial Resource Strain (CARDIA)    Difficulty of Paying Living Expenses: Somewhat hard  Food Insecurity: No Food Insecurity (03/19/2023)   Hunger Vital Sign    Worried About Running Out of Food in the Last Year: Never true    Ran Out of Food in the Last Year: Never true  Transportation Needs: No Transportation Needs (03/19/2023)   PRAPARE - Administrator, Civil Service (Medical): No    Lack of Transportation (Non-Medical): No  Physical Activity: Insufficiently Active (06/23/2018)   Exercise Vital Sign    Days of Exercise per Week: 4 days    Minutes of Exercise per Session: 30 min  Stress: Unknown (06/23/2018)   [employer] of Occupational Health - Occupational  Stress Questionnaire    Feeling of Stress : Patient declined  Social Connections: Unknown (01/18/2022)   Received from [facility]   Social Network    Social Network: Not on file  Intimate Partner Violence: Not At Risk (03/19/2023)   Humiliation, Afraid, Rape, and Kick questionnaire    Fear of Current or Ex-Partner: No    Emotionally Abused: No    Physically Abused: No    Sexually Abused: No    Review of Systems: ROS negative except for what is noted on the assessment and plan.  Vitals:   03/31/23 1505 03/31/23 1545  BP: (!) 149/94 (!) 160/90  Pulse: 75 76  Temp: 98.2 F (36.8 C)   TempSrc: Oral   SpO2: 99%   Weight: 175 lb 14.4 oz (79.8 kg)     Physical Exam: Constitutional: Well-appearing man in a wheelchair with his wife, bonding appropriately to questions. Cardiovascular: No abnormal heart sounds was appreciated on auscultation normal S1-S2  Pulmonary/Chest: Mild wheezing noted on auscultation bilaterally  Assessment & Plan:   Alcohol abuse with alcohol-induced mood disorder (HCC) Patient has a longstanding history of alcohol use disorder, says he used to drink 7-12 beer a day but has not had anything to drink in the past 3 months.  Denies any excessive alcohol cravings at this time.  Benefits of alcohol cessation to patient's overall health has been adequately discussed with the patient.   Encounter for health maintenance examination 67 year old male history of alcohol  abuse and tobacco dependence presenting for a recent ED visit  follow up for which she was managed for acute toxic encephalopathy, said he is able to do all his ADLs, reports all vaccinations are up-to-date. - Shingles vaccine today - Referral for colonoscopy  Nausea Patient reports a 3-day history of nausea with no associated vomiting or diarrhea, says he has episodic diffuse abdominal cramp with the nausea.  Patient denies any travel outside the [place].  Patient requested Zofran  because it's helped him in the past however considering his multiple SSRIs medications, adding Zofran which is a serotonergic agent at this time would not be medically appropriate .Considering differentials of GERD ,peptic ulcer disease, gastroparesis and H. pylori at this time. -H. pylori breath test -Pantoprazole 40 mg daily -Follow-up in a month  Tobacco use disorder Patient has a long history  cigarette smoking , smokes about 1 pack a day since he was 15.  Patient reports trying nicotine patch in the past which did not  help.  Tried Chantix but said it makes him crazy so he did not want to try it anymore.  Smoking cessation resources provided to the patient and complications of nicotine adequately discussed  during this visit.  Continue to assess pt's readiness to quit during our next visit  Patient seen with Dr. Joycelyn Das, M.D [facility] Phone: [phone] Date 03/31/2023 Time 4:35 PM

## 2023-03-31 NOTE — Assessment & Plan Note (Addendum)
67 year old male history of alcohol  abuse and tobacco dependence presenting for a recent ED visit  follow up for which she was managed for acute toxic encephalopathy, said he is able to do all his ADLs, reports all vaccinations are up-to-date. - Shingles vaccine today - Referral for colonoscopy

## 2023-03-31 NOTE — Assessment & Plan Note (Addendum)
Patient reports a 3-day history of nausea with no associated vomiting or diarrhea, says he has episodic diffuse abdominal cramp with the nausea.  Patient denies any travel outside the [place].  Patient requested Zofran because it's helped him in the past however considering his multiple SSRIs medications, adding Zofran which is also a serotonergic agent at this time would not be medically appropriate, especially with his hx of Seroquel overdose .Considering differentials of GERD ,peptic ulcer disease, gastroparesis and H. pylori at this time. -H. pylori breath test -Pantoprazole 40 mg daily -Follow-up in a month

## 2023-03-31 NOTE — Patient Instructions (Signed)
Thank you, [name] for allowing [place] to provide your care today. Today we discussed your hospital follow up and general health. We discussed your need to establish care and your ongoing nausea - I will prescribe you pantoprazole 40 mg once daily - I will write you a prescription for your shingles vaccine - I sent you a referral for coloscopy  - Please don't hesitate to contact my office if you have any other health concerns we can address for you   I have ordered the following labs for you:  Lab Orders         H. pylori breath test       Tests ordered today:    Referrals ordered today:   Referral Orders  No referral(s) requested today     I have ordered the following medication/changed the following medications:   Stop the following medications: There are no discontinued medications.   Start the following medications: Meds ordered this encounter  Medications   pantoprazole (PROTONIX) 40 MG tablet    Sig: Take 1 tablet (40 mg total) by mouth daily.    Dispense:  30 tablet    Refill:  3     Follow up:  1 month     Should you have any questions or concerns please call the internal medicine clinic at [phone].    Kathleen Lime, M.D [facility]

## 2023-03-31 NOTE — Assessment & Plan Note (Signed)
Patient has a longstanding history of alcohol use disorder, says he used to drink 7-12 beer a day but has not had anything to drink in the past 3 months.  Denies any excessive alcohol cravings at this time.  Benefits of alcohol cessation to patient's overall health has been adequately discussed with the patient.

## 2023-04-01 ENCOUNTER — Other Ambulatory Visit: Payer: Self-pay | Admitting: Student

## 2023-04-01 ENCOUNTER — Other Ambulatory Visit: Payer: Self-pay | Admitting: Psychiatry

## 2023-04-01 ENCOUNTER — Other Ambulatory Visit: Payer: Self-pay | Admitting: *Deleted

## 2023-04-01 DIAGNOSIS — F5105 Insomnia due to other mental disorder: Secondary | ICD-10-CM

## 2023-04-01 DIAGNOSIS — F308 Other manic episodes: Secondary | ICD-10-CM

## 2023-04-01 DIAGNOSIS — I6529 Occlusion and stenosis of unspecified carotid artery: Secondary | ICD-10-CM

## 2023-04-01 DIAGNOSIS — F411 Generalized anxiety disorder: Secondary | ICD-10-CM

## 2023-04-01 DIAGNOSIS — Z1211 Encounter for screening for malignant neoplasm of colon: Secondary | ICD-10-CM

## 2023-04-01 LAB — H. PYLORI BREATH TEST: H pylori Breath Test: POSITIVE — AB

## 2023-04-01 NOTE — Addendum Note (Signed)
Addended by: Erlinda Hong T on: 04/01/2023 08:10 AM   Modules accepted: Orders

## 2023-04-01 NOTE — Progress Notes (Addendum)
Internal Medicine Attending:  I was physically present during the critical or key portions of the resident provided service and participated in formulating the plan of care and medical decision making for the patient as documented in the resident's note.  Subacute nausea and dyspepsia is a significant issue for him the last few weeks. Will need to rule out H Pylori today, then trial PPI and refer for EGD if symptoms do not resolve. I wonder if this may be a side effect from psych medications, especially with some inconsistent use.   Tyson Alias, MD

## 2023-04-02 ENCOUNTER — Telehealth: Payer: Self-pay | Admitting: *Deleted

## 2023-04-02 ENCOUNTER — Other Ambulatory Visit: Payer: Self-pay | Admitting: Student

## 2023-04-02 MED ORDER — CLARITHROMYCIN 500 MG PO TABS: 500.0000 mg | ORAL_TABLET | Freq: Two times a day (BID) | ORAL | 0 refills | Status: AC

## 2023-04-02 MED ORDER — AMOXICILLIN 500 MG PO TABS: 500.0000 mg | ORAL_TABLET | Freq: Two times a day (BID) | ORAL | 0 refills | Status: AC

## 2023-04-02 NOTE — Telephone Encounter (Signed)
Received call from David [name] with [facility] in regards to Rx for Clarithromycin. States this med can cause Q-T prolongation, and also inhibits metabolism of Seroquel, therefore, increasing Seroquel side effects. Patient is prescribed Seroquel 600 mg at bedtime by outside provider.

## 2023-04-02 NOTE — Progress Notes (Signed)
[name] is a 67 years old male, who presented to the office a couple days ago with concerns of nausea and abdominal cramping that has been going on for the past couple of weeks, says nothing makes it worse nothing makes it better.  Denies driving starting in his state, denies vomiting or diarrhea.  H. pylori workup came back positive, patient notified over the phone and agreeable to start triple therapy.  Patient denies any allergies to consent to penicillins ,will prescribe PPI, clarithromycin and amoxicillin for 14 days. Potenstial side effect with Quetiapine and Clarithromycin discussed with the patient . Patient called and notified f his new medications and all concerns addressed.

## 2023-04-03 NOTE — Telephone Encounter (Signed)
Relayed info below to [place] at [place]. She will notify John.

## 2023-04-08 ENCOUNTER — Ambulatory Visit: Payer: Medicare HMO | Admitting: Psychiatry

## 2023-04-08 DIAGNOSIS — F1011 Alcohol abuse, in remission: Secondary | ICD-10-CM

## 2023-04-08 DIAGNOSIS — F308 Other manic episodes: Secondary | ICD-10-CM

## 2023-04-08 DIAGNOSIS — F411 Generalized anxiety disorder: Secondary | ICD-10-CM

## 2023-04-08 DIAGNOSIS — M5440 Lumbago with sciatica, unspecified side: Secondary | ICD-10-CM

## 2023-04-08 DIAGNOSIS — F902 Attention-deficit hyperactivity disorder, combined type: Secondary | ICD-10-CM | POA: Diagnosis not present

## 2023-04-08 DIAGNOSIS — G8929 Other chronic pain: Secondary | ICD-10-CM

## 2023-04-08 DIAGNOSIS — F39 Unspecified mood [affective] disorder: Secondary | ICD-10-CM | POA: Diagnosis not present

## 2023-04-08 DIAGNOSIS — I10 Essential (primary) hypertension: Secondary | ICD-10-CM

## 2023-04-08 DIAGNOSIS — F5105 Insomnia due to other mental disorder: Secondary | ICD-10-CM

## 2023-04-08 DIAGNOSIS — G250 Essential tremor: Secondary | ICD-10-CM

## 2023-04-08 MED ORDER — PROPRANOLOL HCL 10 MG PO TABS: ORAL_TABLET | ORAL | 1 refills | Status: DC
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
## 2023-04-12 ENCOUNTER — Encounter: Payer: Self-pay | Admitting: Psychiatry

## 2023-04-12 MED ORDER — QUETIAPINE FUMARATE 200 MG PO TABS: 400.0000 mg | ORAL_TABLET | Freq: Every day | ORAL | 0 refills | Status: DC

## 2023-04-14 ENCOUNTER — Telehealth: Payer: Self-pay | Admitting: Psychiatry

## 2023-04-14 NOTE — Telephone Encounter (Signed)
Patient was provided all the options noted with addresses and phone numbers.

## 2023-04-14 NOTE — Telephone Encounter (Signed)
Pls give him these options: Walkin at [facility] 24 hours.  Is an option [facility] would probably be too expensive but they know a lot about other resources in the community so he could call and ask them.  I called [facility]  [phone].  I didn't reach anyone and I don't think they offer inpatient but it might be worth calling to see .

## 2023-04-14 NOTE — Telephone Encounter (Signed)
Please see message from patient. I did not see mention of a specific rehab facility in your note.

## 2023-04-14 NOTE — Telephone Encounter (Signed)
Next visit is 06/01/23. David [name] stated he saw Dr. Salena Saner a week ago and got a referral for rehab. He was told that he would need to be admitted before he can be seen. David [name] is wanting the actual place and phone number that you referred him to? His phone number is [phone].

## 2023-04-22 ENCOUNTER — Encounter: Payer: Medicare HMO | Admitting: Vascular Surgery

## 2023-04-22 ENCOUNTER — Ambulatory Visit (HOSPITAL_COMMUNITY)
Admission: RE | Admit: 2023-04-22 | Discharge: 2023-04-22 | Disposition: A | Discharge status: Home / Self Care | Payer: Medicare HMO | Source: Ambulatory Visit | Attending: Vascular Surgery | Admitting: Vascular Surgery

## 2023-04-22 DIAGNOSIS — I6529 Occlusion and stenosis of unspecified carotid artery: Secondary | ICD-10-CM

## 2023-05-09 ENCOUNTER — Other Ambulatory Visit: Payer: Self-pay | Admitting: Psychiatry

## 2023-05-13 ENCOUNTER — Other Ambulatory Visit: Payer: Self-pay | Admitting: Psychiatry

## 2023-05-20 ENCOUNTER — Ambulatory Visit (INDEPENDENT_AMBULATORY_CARE_PROVIDER_SITE_OTHER): Payer: Medicare HMO | Admitting: Vascular Surgery

## 2023-05-20 ENCOUNTER — Encounter: Payer: Self-pay | Admitting: Vascular Surgery

## 2023-05-20 VITALS — BP 128/86 | HR 72 | Temp 98.0°F | Resp 20 | Ht 68.0 in | Wt 178.0 lb

## 2023-05-20 DIAGNOSIS — I6529 Occlusion and stenosis of unspecified carotid artery: Secondary | ICD-10-CM

## 2023-05-20 NOTE — Progress Notes (Signed)
Patient ID: David [name], male   DOB: 08-08-1956, 67 y.o.   MRN: [id number]  Reason for Consult: New Patient (Initial Visit)   Referred by Kathleen Lime, MD  Subjective:     HPI:  David [name] is a 67 y.o. male recently admitted with encephalopathy secondary to polypharmacy.  At that time he was found to have stenosis of his right internal carotid artery on CT scan.  He does have history of hypertension and tobacco abuse no longer smokes cigarettes.  He denies any history of stroke, TIA or amaurosis.  He does take aspirin he is not on blood thinners or statin therapy.  Past Medical History:  Diagnosis Date   ADD (attention deficit disorder)    Anemia    Microcytic   Arthritis    Bipolar disorder (HCC)    Depression    Dyspnea    history of no current issues 05/26/2019   GAD (generalized anxiety disorder)    History of kidney stones    passed   HTN (hypertension)    PTSD (post-traumatic stress disorder)    Skin cancer    Family History  Problem Relation Age of Onset   Arthritis Other    Heart disease Other    Cancer Other    Hypertension Other    Past Surgical History:  Procedure Laterality Date   BACK SURGERY     Fusion - lumbar   HIP PINNING,CANNULATED Right 06/24/2018   Procedure: RIGHT CANNULATED HIP PINNING;  Surgeon: Terance Hart, MD;  Location: [facility];  Service: Orthopedics;  Laterality: Right;   LITHOTRIPSY     MULTIPLE EXTRACTIONS WITH ALVEOLOPLASTY Bilateral 05/03/2019   Procedure: MULTIPLE EXTRACTION TEETH NUMBER TWO, THREE, FIVE, SIX, SEVEN, EIGHT, NINE, TEN, ELEVEN, FIFTEEN, SIXTEEN, SEVENTEEN, NINETEEN, TWENTY, TWENTY-ONE, TWENTY-TWO, TWENTY-THREE, TWENTY-FOUR, TWENTY-FIVE, TWENTY-SIX, TWENTY-SEVEN, TWENTY-EIGHT, TWENTY-NINE, THIRTY-TWO WITH ALVEOLOPLASTY;  Surgeon: Ocie Doyne, DDS;  Location: [facility];  Service: Oral Surgery;  Laterality: Bilateral;   NOSE SURGERY     x3   TOTAL HIP ARTHROPLASTY Left 05/27/2019   Procedure: TOTAL HIP  ARTHROPLASTY ANTERIOR APPROACH;  Surgeon: Jodi Geralds, MD;  Location: [facility];  Service: Orthopedics;  Laterality: Left;    Short Social History:  Social History   Tobacco Use   Smoking status: Every Day    Current packs/day: 0.00    Average packs/day: 1 pack/day for 40.0 years (40.0 ttl pk-yrs)    Types: Cigarettes    Start date: 04/25/1979    Last attempt to quit: 04/25/2019    Years since quitting: 4.0   Smokeless tobacco: Never  Substance Use Topics   Alcohol use: Yes    Comment: daily-6-7/day    Allergies  Allergen Reactions   Other Other (See Comments)    Has eczema, so no strong detergents or dryer sheets   Codeine Nausea Only    Current Outpatient Medications  Medication Sig Dispense Refill   aspirin EC 81 MG tablet Take 1 tablet (81 mg total) by mouth daily. Swallow whole. 150 tablet 2   cloNIDine (CATAPRES) 0.2 MG tablet TAKE 1 TABLET(0.2 MG) BY MOUTH TWICE DAILY 180 tablet 0   FLUoxetine (PROZAC) 20 MG capsule TAKE 2 CAPSULES(40 MG) BY MOUTH DAILY 180 capsule 1   hydrOXYzine (ATARAX) 25 MG tablet Take 25 mg by mouth every 6 (six) hours as needed for anxiety or itching.     lamoTRIgine (LAMICTAL) 100 MG tablet Take 1 tablet (100 mg total) by mouth 2 (two) times daily. Appointment needed  for further refills 60 tablet 11   modafinil (PROVIGIL) 200 MG tablet TAKE 1 TABLET BY MOUTH DAILY 30 tablet 2   pantoprazole (PROTONIX) 40 MG tablet Take 1 tablet (40 mg total) by mouth daily. 30 tablet 3   pregabalin (LYRICA) 150 MG capsule TAKE 1 CAPSULE(150 MG) BY MOUTH TWICE DAILY 60 capsule 0   propranolol (INDERAL) 10 MG tablet TAKE 1 TO 4 TABLETS BY MOUTH TWICE DAILY AS NEEDED FOR TREMORS 120 tablet 1   QUEtiapine (SEROQUEL) 200 MG tablet Take 2 tablets (400 mg total) by mouth at bedtime. 180 tablet 0   RINVOQ 15 MG TB24 Take 15 mg by mouth daily.     zolpidem (AMBIEN) 10 MG tablet TAKE 1 TO 1 AND 1/2 TABLETS BY MOUTH AT NIGHT AS NEEDED FOR SLEEP 45 tablet 1   No current  facility-administered medications for this visit.    Review of Systems  Constitutional:  Constitutional negative. HENT: HENT negative.  Eyes: Eyes negative.  Respiratory: Respiratory negative.  Cardiovascular: Cardiovascular negative.  GI: Gastrointestinal negative.  Musculoskeletal: Musculoskeletal negative.  Skin: Skin negative.  Neurological: Positive for focal weakness.  Hematologic: Hematologic/lymphatic negative.  Psychiatric: Psychiatric negative.        Objective:  Objective   Vitals:   05/20/23 1414  BP: 128/86  Pulse: 72  Resp: 20  Temp: 98 F (36.7 C)  SpO2: 98%  Weight: 178 lb (80.7 kg)  Height: 5\' 8"  (1.727 m)   Body mass index is 27.06 kg/m.  Physical Exam HENT:     Head: Normocephalic.     Nose: Nose normal.  Eyes:     Pupils: Pupils are equal, round, and reactive to light.  Abdominal:     General: Abdomen is flat.  Musculoskeletal:        General: Normal range of motion.     Right lower leg: No edema.     Left lower leg: No edema.  Skin:    Capillary Refill: Capillary refill takes less than 2 seconds.     Findings: Rash present.  Neurological:     General: No focal deficit present.     Mental Status: He is alert.  Psychiatric:        Mood and Affect: Mood normal.        Judgment: Judgment normal.     Data: CT IMPRESSION: 1. No emergent large vessel occlusion. 2. Bulky calcified plaque in the proximal right internal carotid artery resulting in approximately 70% stenosis. 3. Mild plaque in the left carotid system without hemodynamically significant stenosis or occlusion. 4. Patent intracranial vasculature.  Right Carotid Findings:  +----------+--------+--------+--------+------------------------------+-----  ----+           PSV cm/sEDV cm/sStenosisPlaque Description             Comments   +----------+--------+--------+--------+------------------------------+-----  ----+  CCA Prox  66      11                                                         +----------+--------+--------+--------+------------------------------+-----  ----+  CCA Distal55      11              heterogenous, irregular and    Shadowing  calcific                                  +----------+--------+--------+--------+------------------------------+-----  ----+  ICA Prox  125     43      40-59%  calcific                       Shadowing  +----------+--------+--------+--------+------------------------------+-----  ----+  ICA Mid   113     29                                                        +----------+--------+--------+--------+------------------------------+-----  ----+  ICA Distal78      29                                                        +----------+--------+--------+--------+------------------------------+-----  ----+  ECA      98      14              heterogenous and irregular                +----------+--------+--------+--------+------------------------------+-----  ----+   +----------+--------+-------+----------------+-------------------+           PSV cm/sEDV cmsDescribe        Arm Pressure (mmHG)  +----------+--------+-------+----------------+-------------------+  [id number]            Multiphasic, [id number]                  +----------+--------+-------+----------------+-------------------+   +---------+--------+--+--------+--+---------+  VertebralPSV cm/s40EDV cm/s10Antegrade  +---------+--------+--+--------+--+---------+      Left Carotid Findings:  +----------+--------+--------+--------+--------------------------+--------+            PSV cm/sEDV cm/sStenosisPlaque Description         Comments  +----------+--------+--------+--------+--------------------------+--------+   CCA Prox  86      23              heterogenous and irregular            +----------+--------+--------+--------+--------------------------+--------+   CCA Distal54      18              heterogenous                         +----------+--------+--------+--------+--------------------------+--------+   ICA Prox  50      13      1-39%                                        +----------+--------+--------+--------+--------------------------+--------+   ICA Mid   56      18                                                   +----------+--------+--------+--------+--------------------------+--------+   ICA Distal101     38                                                   +----------+--------+--------+--------+--------------------------+--------+  ECA      83      11                                                   +----------+--------+--------+--------+--------------------------+--------+    +----------+--------+--------+----------------+-------------------+           PSV cm/sEDV cm/sDescribe        Arm Pressure (mmHG)  +----------+--------+--------+----------------+-------------------+  [id number]             Multiphasic, [id number]                  +----------+--------+--------+----------------+-------------------+   +---------+--------+--+--------+-+---------+  VertebralPSV cm/s41EDV cm/s9Antegrade  +---------+--------+--+--------+-+---------+         Summary:  Right Carotid: Velocities in the right ICA are consistent with a 40-59%                 stenosis.   Left Carotid: Velocities in the left ICA are consistent with a 1-39%  stenosis.   Vertebrals: Bilateral vertebral arteries demonstrate antegrade flow.  Subclavians: Normal flow hemodynamics were seen in bilateral subclavian               arteries.      Assessment/Plan:     67 year old male here for evaluation of 70% right ICA stenosis by CT where the stenosis is heavily calcified.  He has undergone duplex which demonstrates  approximately 50% stenosis and this is more reasonable evaluation given that CT scan likely over emphasizes the stenosis with heavy calcium burden.  We reviewed both imaging studies with his significant other today and they demonstrate good understanding of this.  We discussed the signs and symptoms of stroke.  We also discussed his need for statin therapy and I will leave this up to his primary care doctor.  He will continue aspirin.  We will follow him up with repeat carotid duplex in 1 year.    Maeola Harman MD [facility]

## 2023-05-29 ENCOUNTER — Encounter (HOSPITAL_BASED_OUTPATIENT_CLINIC_OR_DEPARTMENT_OTHER): Payer: Self-pay | Admitting: Emergency Medicine

## 2023-05-29 ENCOUNTER — Other Ambulatory Visit: Payer: Self-pay

## 2023-05-29 DIAGNOSIS — Z85828 Personal history of other malignant neoplasm of skin: Secondary | ICD-10-CM | POA: Insufficient documentation

## 2023-05-29 DIAGNOSIS — I10 Essential (primary) hypertension: Secondary | ICD-10-CM | POA: Diagnosis not present

## 2023-05-29 DIAGNOSIS — R1084 Generalized abdominal pain: Secondary | ICD-10-CM | POA: Insufficient documentation

## 2023-05-29 DIAGNOSIS — R112 Nausea with vomiting, unspecified: Secondary | ICD-10-CM | POA: Insufficient documentation

## 2023-05-29 DIAGNOSIS — R197 Diarrhea, unspecified: Secondary | ICD-10-CM | POA: Insufficient documentation

## 2023-05-29 DIAGNOSIS — Z8616 Personal history of COVID-19: Secondary | ICD-10-CM | POA: Insufficient documentation

## 2023-05-29 DIAGNOSIS — R051 Acute cough: Secondary | ICD-10-CM | POA: Insufficient documentation

## 2023-05-29 LAB — CBC
HCT: 27.1 % — ABNORMAL LOW (ref 39.0–52.0)
Hemoglobin: 9 g/dL — ABNORMAL LOW (ref 13.0–17.0)
MCH: 26.2 pg (ref 26.0–34.0)
MCHC: 33.2 g/dL (ref 30.0–36.0)
MCV: 78.8 fL — ABNORMAL LOW (ref 80.0–100.0)
Platelets: 531 10*3/uL — ABNORMAL HIGH (ref 150–400)
RBC: 3.44 MIL/uL — ABNORMAL LOW (ref 4.22–5.81)
RDW: 15.2 % (ref 11.5–15.5)
WBC: 9.8 10*3/uL (ref 4.0–10.5)
nRBC: 0 % (ref 0.0–0.2)

## 2023-05-29 LAB — COMPREHENSIVE METABOLIC PANEL
ALT: 22 U/L (ref 0–44)
AST: 25 U/L (ref 15–41)
Albumin: 3.5 g/dL (ref 3.5–5.0)
Alkaline Phosphatase: 89 U/L (ref 38–126)
Anion gap: 12 (ref 5–15)
BUN: 9 mg/dL (ref 8–23)
CO2: 19 mmol/L — ABNORMAL LOW (ref 22–32)
Calcium: 8.6 mg/dL — ABNORMAL LOW (ref 8.9–10.3)
Chloride: 98 mmol/L (ref 98–111)
Creatinine, Ser: 1.09 mg/dL (ref 0.61–1.24)
GFR, Estimated: >60 mL/min (ref >60)
Glucose, Bld: 89 mg/dL (ref 70–99)
Potassium: 4.1 mmol/L (ref 3.5–5.1)
Sodium: 129 mmol/L — ABNORMAL LOW (ref 135–145)
Total Bilirubin: 0.4 mg/dL (ref 0.3–1.2)
Total Protein: 7.1 g/dL (ref 6.5–8.1)

## 2023-05-29 LAB — LIPASE, BLOOD: Lipase: 25 U/L (ref 11–51)

## 2023-05-29 NOTE — ED Triage Notes (Addendum)
Patient complaining of lower abdominal pain and n/v ongoing for almost 2 weeks. Positive covid test 2 weeks ago. Unable to keep food/fluids down.

## 2023-05-30 ENCOUNTER — Emergency Department (HOSPITAL_BASED_OUTPATIENT_CLINIC_OR_DEPARTMENT_OTHER)
Admission: EM | Admit: 2023-05-30 | Discharge: 2023-05-30 | Disposition: A | Discharge status: Home / Self Care | Payer: Medicare HMO | Attending: Emergency Medicine | Admitting: Emergency Medicine

## 2023-05-30 ENCOUNTER — Emergency Department (HOSPITAL_BASED_OUTPATIENT_CLINIC_OR_DEPARTMENT_OTHER): Payer: Medicare HMO

## 2023-05-30 ENCOUNTER — Encounter (HOSPITAL_BASED_OUTPATIENT_CLINIC_OR_DEPARTMENT_OTHER): Payer: Self-pay

## 2023-05-30 DIAGNOSIS — R112 Nausea with vomiting, unspecified: Secondary | ICD-10-CM

## 2023-05-30 DIAGNOSIS — R1084 Generalized abdominal pain: Secondary | ICD-10-CM

## 2023-05-30 DIAGNOSIS — R051 Acute cough: Secondary | ICD-10-CM

## 2023-05-30 LAB — URINALYSIS, ROUTINE W REFLEX MICROSCOPIC
Bilirubin Urine: NEGATIVE
Glucose, UA: NEGATIVE mg/dL
Hgb urine dipstick: NEGATIVE
Ketones, ur: NEGATIVE mg/dL
Leukocytes,Ua: NEGATIVE
Nitrite: NEGATIVE
Protein, ur: NEGATIVE mg/dL
Specific Gravity, Urine: <=1.005 (ref 1.005–1.030)
pH: 6 (ref 5.0–8.0)

## 2023-05-30 MED ORDER — IOHEXOL 300 MG/ML  SOLN
100.0000 mL | Freq: Once | INTRAMUSCULAR | Status: AC | PRN
  Administered 2023-05-30: 100 mL via INTRAVENOUS

## 2023-05-30 MED ORDER — SODIUM CHLORIDE 0.9 % IV BOLUS
1000.0000 mL | Freq: Once | INTRAVENOUS | Status: AC
  Administered 2023-05-30: 1000 mL via INTRAVENOUS

## 2023-05-30 MED ORDER — ONDANSETRON HCL 4 MG/2ML IJ SOLN
4.0000 mg | Freq: Once | INTRAMUSCULAR | Status: AC
  Administered 2023-05-30: 4 mg via INTRAVENOUS
  Filled 2023-05-30: qty 2

## 2023-05-30 MED ORDER — DOXYCYCLINE HYCLATE 100 MG PO CAPS: 100.0000 mg | ORAL_CAPSULE | Freq: Two times a day (BID) | ORAL | 0 refills | Status: DC

## 2023-05-30 MED ORDER — ONDANSETRON 4 MG PO TBDP: 4.0000 mg | ORAL_TABLET | Freq: Three times a day (TID) | ORAL | 0 refills | Status: DC | PRN

## 2023-05-30 NOTE — ED Provider Notes (Signed)
Emergency Department Provider Note   I have reviewed the triage vital signs and the nursing notes.   HISTORY  Chief Complaint Abdominal Pain   HPI David [name] is a 67 y.o. male past history reviewed below presents emergency department with diffuse abdominal discomfort with multiple episodes of emesis.  Symptoms been waxing and waning over the past 2 weeks.  He states 2 weeks ago he was diagnosed with COVID but was treated without complication.  He denies any chest pain but notes occasional shortness of breath and cough.  No fevers.  His wife, by phone, tells me that he has been confused earlier in the week but that seems to have slightly improved.  He has not been eating or drinking well, however, and continues to complain of diffuse abdominal pain which prompted the ED visit this evening.   Past Medical History:  Diagnosis Date   ADD (attention deficit disorder)    Anemia    Microcytic   Arthritis    Bipolar disorder (HCC)    Depression    Dyspnea    history of no current issues 05/26/2019   GAD (generalized anxiety disorder)    History of kidney stones    passed   HTN (hypertension)    PTSD (post-traumatic stress disorder)    Skin cancer     Review of Systems  Constitutional: No fever/chills Cardiovascular: Denies chest pain. Respiratory: Denies shortness of breath. Gastrointestinal: Positive abdominal pain. Positive nausea and vomiting.  No diarrhea.  No constipation. Genitourinary: Negative for dysuria. Musculoskeletal: Negative for back pain. Skin: Negative for rash. Neurological: Negative for headaches, focal weakness or numbness.  ____________________________________________   PHYSICAL EXAM:  VITAL SIGNS: ED Triage Vitals [05/29/23 2040]  Encounter Vitals Group     BP 112/87     Pulse Rate (!) 59     Resp 20     Temp 97.6 F (36.4 C)     Temp Source Oral     SpO2 100 %     Weight 175 lb (79.4 kg)     Height 5\' 8"  (1.727 m)   Constitutional:  Alert and oriented. Well appearing and in no acute distress. Eyes: Conjunctivae are normal.  Head: Atraumatic. Nose: No congestion/rhinnorhea. Mouth/Throat: Mucous membranes are slightly dry.  Neck: No stridor.   Cardiovascular: Normal rate, regular rhythm. Good peripheral circulation. Grossly normal heart sounds.   Respiratory: Normal respiratory effort.  No retractions. Lungs CTAB. Gastrointestinal: Soft and nontender. No distention.  Musculoskeletal: No lower extremity tenderness nor edema. No gross deformities of extremities. Neurologic:  Normal speech and language. No gross focal neurologic deficits are appreciated.  Skin:  Skin is warm, dry and intact. No rash noted.  ____________________________________________   LABS (all labs ordered are listed, but only abnormal results are displayed)  Labs Reviewed  COMPREHENSIVE METABOLIC PANEL - Abnormal; Notable for the following components:      Result Value   Sodium 129 (*)    CO2 19 (*)    Calcium 8.6 (*)    All other components within normal limits  CBC - Abnormal; Notable for the following components:   RBC 3.44 (*)    Hemoglobin 9.0 (*)    HCT 27.1 (*)    MCV 78.8 (*)    Platelets 531 (*)    All other components within normal limits  LIPASE, BLOOD  URINALYSIS, ROUTINE W REFLEX MICROSCOPIC   ____________________________________________  RADIOLOGY  DG Chest 2 View  Result Date: 05/30/2023 CLINICAL DATA:  Cough,  lower abdominal pain and nausea and vomiting. EXAM: CHEST - 2 VIEW COMPARISON:  02/24/2023. FINDINGS: The heart size and mediastinal contours are within normal limits. There is atherosclerotic calcification of the aorta. Focal opacity is noted in the mid left lung. No effusion or pneumothorax. Degenerative changes are present in the thoracic spine. No acute osseous abnormality. IMPRESSION: Focal opacity in the mid left lung, may be related to overlapping densities. The possibility of focal airspace disease or nodule  can not be excluded. Electronically Signed   By: Thornell Sartorius M.D.   On: 05/30/2023 01:36   CT ABDOMEN PELVIS W CONTRAST  Result Date: 05/30/2023 CLINICAL DATA:  Lower abdominal pain with nausea and vomiting for 2 weeks. EXAM: CT ABDOMEN AND PELVIS WITH CONTRAST TECHNIQUE: Multidetector CT imaging of the abdomen and pelvis was performed using the standard protocol following bolus administration of intravenous contrast. RADIATION DOSE REDUCTION: This exam was performed according to the departmental dose-optimization program which includes automated exposure control, adjustment of the mA and/or kV according to patient size and/or use of iterative reconstruction technique. CONTRAST:  OMNIPAQUE IOHEXOL 300 MG/ML  SOLN COMPARISON:  None Available. FINDINGS: Lower chest: There are trace bilateral pleural effusions with atelectasis at the lung bases. Hepatobiliary: No focal liver abnormality is seen. No gallstones, gallbladder wall thickening, or biliary dilatation. Pancreas: Unremarkable. No pancreatic ductal dilatation or surrounding inflammatory changes. Spleen: Normal in size without focal abnormality. Adrenals/Urinary Tract: The adrenal glands are within normal limits. The kidneys enhance symmetrically. No renal calculus or obstructive uropathy. The bladder is within normal limits. Stomach/Bowel: Stomach is within normal limits. Appendix appears normal. No evidence of bowel wall thickening, distention, or inflammatory changes. No free air or pneumatosis. Vascular/Lymphatic: Aortic atherosclerosis. No enlarged abdominal or pelvic lymph nodes. Reproductive: Prostate is unremarkable. Other: No abdominopelvic ascites. Musculoskeletal: Fixation hardware is present in the proximal right femur. Total hip arthroplasty changes are noted on the left. Degenerative changes are present in the thoracolumbar spine. No acute osseous abnormality. IMPRESSION: 1. No acute intra-abdominal process. 2. Trace bilateral pleural  effusions with atelectasis. 3. Aortic atherosclerosis. Electronically Signed   By: Thornell Sartorius M.D.   On: 05/30/2023 01:32    ____________________________________________   PROCEDURES  Procedure(s) performed:   Procedures  None ____________________________________________   INITIAL IMPRESSION / ASSESSMENT AND PLAN / ED COURSE  Pertinent labs & imaging results that were available during my care of the patient were reviewed by me and considered in my medical decision making (see chart for details).   This patient is Presenting for Evaluation of abdominal pain, which does require a range of treatment options, and is a complaint that involves a high risk of morbidity and mortality.  The Differential Diagnoses includes but is not exclusive to acute cholecystitis, intrathoracic causes for epigastric abdominal pain, gastritis, duodenitis, pancreatitis, small bowel or large bowel obstruction, abdominal aortic aneurysm, hernia, gastritis, etc.   Critical Interventions-    Medications  sodium chloride 0.9 % bolus 1,000 mL (0 mLs Intravenous Stopped 05/30/23 0305)  ondansetron (ZOFRAN) injection 4 mg (4 mg Intravenous Given 05/30/23 0045)  iohexol (OMNIPAQUE) 300 MG/ML solution 100 mL (100 mLs Intravenous Contrast Given 05/30/23 0114)    Reassessment after intervention: symptoms improved.    I did obtain Additional Historical Information from wife by phone.  I decided to review pertinent External Data, and in summary patient with recent admit (03/2023) for encephalopathy thought to be 2/2 polypharmacy.    Clinical Laboratory Tests Ordered, included mild hyponatremia at  129 similar to prior values.  LFTs, bilirubin, lipase normal.  Mild anemia at 9.0 but not to the point of requiring blood transfusion.  Radiologic Tests Ordered, included CXR and CT abdomen/pelvis. I independently interpreted the images and agree with radiology interpretation.   Cardiac Monitor Tracing which shows NSR.     Social Determinants of Health Risk patient is a smoker.   Medical Decision Making: Summary:  Patient presents emergency department with abdominal pain and vomiting.  Mild altered mental status earlier in the week which seems to have improved.  Fairly minimal electrolyte derangements with any mild hyponatremia at 129.  Patient with sodium in the 130 range earlier this year. Doubt this is contributing significantly to his current situation. Plan for IVF, CT imaging and CXR with recent COVID infection and cough.   Reevaluation with update and discussion with CT without acute process. ? Infiltrate vs artifact vs nodule on CXR. Patient with cough and recent COVID infection. Will cover with Doxy and Zofran for nausea. Patient tolerating PO in the ED. Stable for discharge. No increased WOB, hypoxemia, or AMS here in the ED.   Considered admission but workup is reassuring and stable for outpatient treatment and PCP follow up.   Patient's presentation is most consistent with acute presentation with potential threat to life or bodily function.   Disposition: discharge  ____________________________________________  FINAL CLINICAL IMPRESSION(S) / ED DIAGNOSES  Final diagnoses:  Generalized abdominal pain  Nausea vomiting and diarrhea  Acute cough     NEW OUTPATIENT MEDICATIONS STARTED DURING THIS VISIT:  Discharge Medication List as of 05/30/2023  3:05 AM     START taking these medications   Details  doxycycline (VIBRAMYCIN) 100 MG capsule Take 1 capsule (100 mg total) by mouth 2 (two) times daily for 7 days., Starting Sat 05/30/2023, Until Sat 06/06/2023, Normal    ondansetron (ZOFRAN-ODT) 4 MG disintegrating tablet Take 1 tablet (4 mg total) by mouth every 8 (eight) hours as needed., Starting Sat 05/30/2023, Normal        Note:  This document was prepared using Dragon voice recognition software and may include unintentional dictation errors.  Alona Bene, MD, [facility]    Cathie Bonnell, Arlyss Repress, MD 05/30/23 [phone]

## 2023-05-30 NOTE — Discharge Instructions (Signed)
You were seen in the emerged department today with abdominal pain and vomiting.  Your CT scan was normal and your labs are reassuring.  Chest x-ray showed a possible, tiny developing pneumonia and I am treating you with antibiotics to be in the safe side.  Take the medication as directed.  Zofran as taken as needed for nausea and vomiting.  Drink plenty of fluids.  Please follow closely with your primary care doctor in the coming week.

## 2023-05-30 NOTE — ED Notes (Signed)
Patient tolerated a can of ginger ale.

## 2023-06-01 ENCOUNTER — Encounter: Payer: Self-pay | Admitting: Psychiatry

## 2023-06-01 ENCOUNTER — Ambulatory Visit (INDEPENDENT_AMBULATORY_CARE_PROVIDER_SITE_OTHER): Payer: Medicare HMO | Admitting: Psychiatry

## 2023-06-01 DIAGNOSIS — F5105 Insomnia due to other mental disorder: Secondary | ICD-10-CM

## 2023-06-01 DIAGNOSIS — F17219 Nicotine dependence, cigarettes, with unspecified nicotine-induced disorders: Secondary | ICD-10-CM | POA: Diagnosis not present

## 2023-06-01 DIAGNOSIS — I10 Essential (primary) hypertension: Secondary | ICD-10-CM

## 2023-06-01 DIAGNOSIS — F902 Attention-deficit hyperactivity disorder, combined type: Secondary | ICD-10-CM | POA: Diagnosis not present

## 2023-06-01 DIAGNOSIS — F1011 Alcohol abuse, in remission: Secondary | ICD-10-CM

## 2023-06-01 DIAGNOSIS — F411 Generalized anxiety disorder: Secondary | ICD-10-CM

## 2023-06-01 DIAGNOSIS — F39 Unspecified mood [affective] disorder: Secondary | ICD-10-CM

## 2023-06-01 DIAGNOSIS — R112 Nausea with vomiting, unspecified: Secondary | ICD-10-CM

## 2023-06-01 MED ORDER — NICOTINE 21 MG/24HR TD PT24
21.0000 mg | MEDICATED_PATCH | Freq: Every day | TRANSDERMAL | 0 refills | Status: AC
Start: 2023-06-01 — End: ?

## 2023-06-01 MED ORDER — PROMETHAZINE HCL 25 MG PO TABS: 12.5000 mg | ORAL_TABLET | Freq: Four times a day (QID) | ORAL | 0 refills | Status: DC | PRN

## 2023-06-01 NOTE — Progress Notes (Signed)
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 placed in this encounter.      -------------------------------

## 2023-06-02 ENCOUNTER — Telehealth: Payer: Self-pay | Admitting: *Deleted

## 2023-06-02 ENCOUNTER — Encounter: Payer: Self-pay | Admitting: Internal Medicine

## 2023-06-02 ENCOUNTER — Other Ambulatory Visit: Payer: Self-pay

## 2023-06-02 ENCOUNTER — Encounter (HOSPITAL_COMMUNITY): Payer: Self-pay | Admitting: Internal Medicine

## 2023-06-02 ENCOUNTER — Observation Stay (HOSPITAL_COMMUNITY)
Admission: AD | Admit: 2023-06-02 | Discharge: 2023-06-04 | Disposition: A | Discharge status: Home / Self Care | Payer: Medicare HMO | Source: Ambulatory Visit | Attending: Internal Medicine | Admitting: Internal Medicine

## 2023-06-02 ENCOUNTER — Ambulatory Visit: Payer: Medicare HMO | Admitting: Internal Medicine

## 2023-06-02 ENCOUNTER — Encounter (HOSPITAL_COMMUNITY): Payer: Self-pay

## 2023-06-02 VITALS — BP 119/74 | HR 50 | Temp 97.6°F | Ht 68.0 in | Wt 179.0 lb

## 2023-06-02 DIAGNOSIS — R634 Abnormal weight loss: Secondary | ICD-10-CM | POA: Insufficient documentation

## 2023-06-02 DIAGNOSIS — A048 Other specified bacterial intestinal infections: Secondary | ICD-10-CM

## 2023-06-02 DIAGNOSIS — D509 Iron deficiency anemia, unspecified: Secondary | ICD-10-CM | POA: Diagnosis not present

## 2023-06-02 DIAGNOSIS — Z96642 Presence of left artificial hip joint: Secondary | ICD-10-CM | POA: Diagnosis not present

## 2023-06-02 DIAGNOSIS — F17210 Nicotine dependence, cigarettes, uncomplicated: Secondary | ICD-10-CM | POA: Diagnosis not present

## 2023-06-02 DIAGNOSIS — Z79899 Other long term (current) drug therapy: Secondary | ICD-10-CM | POA: Insufficient documentation

## 2023-06-02 DIAGNOSIS — I10 Essential (primary) hypertension: Secondary | ICD-10-CM | POA: Insufficient documentation

## 2023-06-02 DIAGNOSIS — Z7982 Long term (current) use of aspirin: Secondary | ICD-10-CM | POA: Diagnosis not present

## 2023-06-02 DIAGNOSIS — I6529 Occlusion and stenosis of unspecified carotid artery: Secondary | ICD-10-CM

## 2023-06-02 DIAGNOSIS — K3189 Other diseases of stomach and duodenum: Secondary | ICD-10-CM | POA: Insufficient documentation

## 2023-06-02 DIAGNOSIS — K5730 Diverticulosis of large intestine without perforation or abscess without bleeding: Secondary | ICD-10-CM | POA: Insufficient documentation

## 2023-06-02 DIAGNOSIS — R1110 Vomiting, unspecified: Secondary | ICD-10-CM | POA: Diagnosis not present

## 2023-06-02 DIAGNOSIS — R195 Other fecal abnormalities: Secondary | ICD-10-CM | POA: Diagnosis not present

## 2023-06-02 DIAGNOSIS — R112 Nausea with vomiting, unspecified: Principal | ICD-10-CM | POA: Insufficient documentation

## 2023-06-02 DIAGNOSIS — R110 Nausea: Secondary | ICD-10-CM

## 2023-06-02 LAB — COMPREHENSIVE METABOLIC PANEL
ALT: 14 U/L (ref 0–44)
AST: 14 U/L — ABNORMAL LOW (ref 15–41)
Albumin: 3.3 g/dL — ABNORMAL LOW (ref 3.5–5.0)
Alkaline Phosphatase: 94 U/L (ref 38–126)
Anion gap: 12 (ref 5–15)
BUN: 6 mg/dL — ABNORMAL LOW (ref 8–23)
CO2: 25 mmol/L (ref 22–32)
Calcium: 8.8 mg/dL — ABNORMAL LOW (ref 8.9–10.3)
Chloride: 91 mmol/L — ABNORMAL LOW (ref 98–111)
Creatinine, Ser: 1.17 mg/dL (ref 0.61–1.24)
GFR, Estimated: >60 mL/min (ref >60)
Glucose, Bld: 101 mg/dL — ABNORMAL HIGH (ref 70–99)
Potassium: 4.1 mmol/L (ref 3.5–5.1)
Sodium: 128 mmol/L — ABNORMAL LOW (ref 135–145)
Total Bilirubin: 0.5 mg/dL (ref 0.3–1.2)
Total Protein: 6.8 g/dL (ref 6.5–8.1)

## 2023-06-02 LAB — CBC
HCT: 26.3 % — ABNORMAL LOW (ref 39.0–52.0)
Hemoglobin: 8.5 g/dL — ABNORMAL LOW (ref 13.0–17.0)
MCH: 25.4 pg — ABNORMAL LOW (ref 26.0–34.0)
MCHC: 32.3 g/dL (ref 30.0–36.0)
MCV: 78.5 fL — ABNORMAL LOW (ref 80.0–100.0)
Platelets: 491 10*3/uL — ABNORMAL HIGH (ref 150–400)
RBC: 3.35 MIL/uL — ABNORMAL LOW (ref 4.22–5.81)
RDW: 14.7 % (ref 11.5–15.5)
WBC: 4.4 10*3/uL (ref 4.0–10.5)
nRBC: 0 % (ref 0.0–0.2)

## 2023-06-02 LAB — LACTIC ACID, PLASMA: Lactic Acid, Venous: 0.7 mmol/L (ref 0.5–1.9)

## 2023-06-02 MED ORDER — UPADACITINIB ER 15 MG PO TB24: 15.0000 mg | ORAL_TABLET | Freq: Every day | ORAL | Status: DC

## 2023-06-02 MED ORDER — NICOTINE 21 MG/24HR TD PT24
21.0000 mg | MEDICATED_PATCH | Freq: Every day | TRANSDERMAL | Status: DC
  Administered 2023-06-02 – 2023-06-04 (×3): 21 mg via TRANSDERMAL
  Filled 2023-06-02 (×3): qty 1

## 2023-06-02 MED ORDER — ZOLPIDEM TARTRATE 5 MG PO TABS
10.0000 mg | ORAL_TABLET | Freq: Every day | ORAL | Status: DC
  Administered 2023-06-02 – 2023-06-03 (×2): 10 mg via ORAL
  Filled 2023-06-02 (×2): qty 2

## 2023-06-02 MED ORDER — LAMOTRIGINE 25 MG PO TABS
100.0000 mg | ORAL_TABLET | Freq: Two times a day (BID) | ORAL | Status: DC
  Administered 2023-06-02 – 2023-06-04 (×4): 100 mg via ORAL
  Filled 2023-06-02 (×4): qty 4

## 2023-06-02 MED ORDER — HYDROXYZINE HCL 25 MG PO TABS: 25.0000 mg | ORAL_TABLET | Freq: Four times a day (QID) | ORAL | Status: DC | PRN

## 2023-06-02 MED ORDER — FLUOXETINE HCL 20 MG PO CAPS
40.0000 mg | ORAL_CAPSULE | Freq: Every day | ORAL | Status: DC
  Administered 2023-06-03 – 2023-06-04 (×2): 40 mg via ORAL
  Filled 2023-06-02 (×2): qty 2

## 2023-06-02 MED ORDER — PROMETHAZINE HCL 25 MG PO TABS
25.0000 mg | ORAL_TABLET | Freq: Four times a day (QID) | ORAL | Status: DC | PRN
  Administered 2023-06-02 – 2023-06-04 (×6): 25 mg via ORAL
  Filled 2023-06-02 (×6): qty 1

## 2023-06-02 MED ORDER — PANTOPRAZOLE SODIUM 40 MG PO TBEC
40.0000 mg | DELAYED_RELEASE_TABLET | Freq: Every day | ORAL | Status: DC
  Administered 2023-06-02 – 2023-06-03 (×2): 40 mg via ORAL
  Filled 2023-06-02 (×2): qty 1

## 2023-06-02 MED ORDER — ENOXAPARIN SODIUM 40 MG/0.4ML IJ SOSY
40.0000 mg | PREFILLED_SYRINGE | INTRAMUSCULAR | Status: DC
  Administered 2023-06-02: 40 mg via SUBCUTANEOUS
  Filled 2023-06-02: qty 0.4

## 2023-06-02 MED ORDER — PREGABALIN 75 MG PO CAPS
150.0000 mg | ORAL_CAPSULE | Freq: Two times a day (BID) | ORAL | Status: DC
  Administered 2023-06-02 – 2023-06-04 (×4): 150 mg via ORAL
  Filled 2023-06-02 (×4): qty 2

## 2023-06-02 NOTE — Assessment & Plan Note (Signed)
>>  ASSESSMENT AND PLAN FOR EMESIS WRITTEN ON 06/02/2023  3:19 PM BY Monna Fam, MD  David [name] is a 67 year old man with medical history significant for HTN, seizure disorder, GAD, AUD, Bipolar disorder, who came to the [facility] clinic for vomiting and nausea. Patient reports for the past two weeks he has been unable to keep down any solid food, promptly vomiting anything solid within minutes after eating. He has only been able to keep down very small amounts of liquids. He has not had any blood in his vomit, though has noticed some recent dark stools. He reports malaise and mild epigastric pain. Negative for fever, diarrhea, constipation, dysuria.   Patient went to the ED on 9/14 for same symptoms where a CT/AP was performed, which was negative for obstruction or other abdominal pathology. He also had an [facility] appointment on 7/16 for nausea and epigastric pain, at that time he had a positive H pylori breath test and completed a course of triple therapy with partial resolution of symptoms.   -Given patient's inability to keep down anything PO, patient will be directly admitted to IMTS to expedite being seen by GI -CBC, CMP, lactic acid ordered in clinic

## 2023-06-02 NOTE — Progress Notes (Signed)
Subjective:  CC: Vomiting  HPI: David [name] is a 67 y.o. male with a past medical history stated below and presents today for above. Please see problem based assessment and plan for additional details.  Past Medical History:  Diagnosis Date   ADD (attention deficit disorder)    Anemia    Microcytic   Arthritis    Bipolar disorder (HCC)    Depression    Dyspnea    history of no current issues 05/26/2019   GAD (generalized anxiety disorder)    History of kidney stones    passed   HTN (hypertension)    PTSD (post-traumatic stress disorder)    Skin cancer     Current Outpatient Medications on File Prior to Visit  Medication Sig Dispense Refill   aspirin EC 81 MG tablet Take 1 tablet (81 mg total) by mouth daily. Swallow whole. 150 tablet 2   cloNIDine (CATAPRES) 0.2 MG tablet TAKE 1 TABLET(0.2 MG) BY MOUTH TWICE DAILY 180 tablet 0   doxycycline (VIBRAMYCIN) 100 MG capsule Take 1 capsule (100 mg total) by mouth 2 (two) times daily for 7 days. 14 capsule 0   FLUoxetine (PROZAC) 20 MG capsule TAKE 2 CAPSULES(40 MG) BY MOUTH DAILY 180 capsule 1   hydrOXYzine (ATARAX) 25 MG tablet Take 25 mg by mouth every 6 (six) hours as needed for anxiety or itching.     lamoTRIgine (LAMICTAL) 100 MG tablet Take 1 tablet (100 mg total) by mouth 2 (two) times daily. Appointment needed for further refills 60 tablet 11   modafinil (PROVIGIL) 200 MG tablet TAKE 1 TABLET BY MOUTH DAILY 30 tablet 2   nicotine (NICODERM CQ) 21 mg/24hr patch Place 1 patch (21 mg total) onto the skin daily. 28 patch 0   ondansetron (ZOFRAN-ODT) 4 MG disintegrating tablet Take 1 tablet (4 mg total) by mouth every 8 (eight) hours as needed. 20 tablet 0   pantoprazole (PROTONIX) 40 MG tablet Take 1 tablet (40 mg total) by mouth daily. 30 tablet 3   pregabalin (LYRICA) 150 MG capsule TAKE 1 CAPSULE(150 MG) BY MOUTH TWICE DAILY 60 capsule 0   promethazine (PHENERGAN) 25 MG tablet Take 0.5-1 tablets (12.5-25 mg total) by  mouth every 6 (six) hours as needed for nausea or vomiting. 30 tablet 0   propranolol (INDERAL) 10 MG tablet TAKE 1 TO 4 TABLETS BY MOUTH TWICE DAILY AS NEEDED FOR TREMORS 120 tablet 1   QUEtiapine (SEROQUEL) 200 MG tablet Take 2 tablets (400 mg total) by mouth at bedtime. 180 tablet 0   RINVOQ 15 MG TB24 Take 15 mg by mouth daily.     zolpidem (AMBIEN) 10 MG tablet TAKE 1 TO 1 AND 1/2 TABLETS BY MOUTH AT NIGHT AS NEEDED FOR SLEEP 45 tablet 1   No current facility-administered medications on file prior to visit.    Review of Systems: ROS negative except for as is noted on the assessment and plan.  Objective:   Vitals:   06/02/23 1419  BP: 119/74  Pulse: (!) 50  Temp: 97.6 F (36.4 C)  TempSrc: Oral  SpO2: 100%  Weight: 179 lb (81.2 kg)  Height: 5\' 8"  (1.727 m)    Physical Exam: Constitutional: well-appearing, in no acute distress HENT: normocephalic atraumatic, mucous membranes moist Eyes: conjunctiva non-erythematous Neck: supple Cardiovascular: regular rate and rhythm, no m/r/g Pulmonary/Chest: normal work of breathing on room air, lungs clear to auscultation bilaterally Abdominal: soft, mild epigastric tenderness to deep palpation MSK: normal bulk and  tone Neurological: alert & oriented x 3, 5/5 strength in bilateral upper and lower extremities, normal gait Skin: warm and dry  Assessment & Plan:   Emesis David [name] is a 67 year old man with medical history significant for HTN, seizure disorder, GAD, AUD, Bipolar disorder, who came to the [facility] clinic for vomiting and nausea. Patient reports for the past two weeks he has been unable to keep down any solid food, promptly vomiting anything solid within minutes after eating. He has only been able to keep down very small amounts of liquids. He has not had any blood in his vomit, though has noticed some recent dark stools. He reports malaise and mild epigastric pain. Negative for fever, diarrhea, constipation, dysuria.   Patient  went to the ED on 9/14 for same symptoms where a CT/AP was performed, which was negative for obstruction or other abdominal pathology. He also had an [facility] appointment on 7/16 for nausea and epigastric pain, at that time he had a positive H pylori breath test and completed a course of triple therapy with partial resolution of symptoms.   -Given patient's inability to keep down anything PO, patient will be directly admitted to IMTS to expedite being seen by GI -CBC, CMP, lactic acid ordered in clinic    Patient seen with Dr. Pershing Proud MD [facility] Internal Medicine  PGY-1 Pager: [phone] Date 06/02/2023  Time 3:19 PM

## 2023-06-02 NOTE — Progress Notes (Signed)
Report called to [facility] RN on [facility].

## 2023-06-02 NOTE — H&P (Cosign Needed)
Date: 06/02/2023               Patient Name:  David [name] MRN: [id number]  DOB: 05/16/1956 Age / Sex: 67 y.o., male   PCP: Kathleen Lime, MD         Medical Service: Internal Medicine Teaching Service         Attending Physician: Dr. Mercie Eon, MD      First Contact: Dr. Lovie Macadamia MD Pager [phone]    Second Contact: Dr. Rocky Morel, DO Pager [phone]         After Hours (After 5p/  First Contact Pager: [phone]  weekends / holidays): Second Contact Pager: [phone]   SUBJECTIVE   Chief Complaint: Vomiting  History of Present Illness: [name] is a 67 year old gentleman with a past medical history significant for seizure disorder, hypertension, tobacco use disorder, history of alcohol abuse, and mood disorders including GAD, MDD, episodic mood disorder.  The patient was directly admitted to our service from the internal medicine teaching service by Dr. Carlynn Purl MD and Dr. Heide Spark MD in the setting of inability to tolerate p.o. and repetitive nausea and vomiting for the past 1 to 2 weeks.  The patient has 2 recent hospitalizations at the end of June, early July of this year for seizures in the setting of missed Lamictal doses.  He was seen in the internal medicine clinic on 03/31/2023 by Dr. Mickie Bail and Dr. Oswaldo Done where he presented with a 3-day history of nausea, without vomiting and diffuse abdominal pain.  He was started on pantoprazole 40 mg daily and H. pylori breath test was conducted which was positive.  He completed a 14-day course of amoxicillin, Clarithromycin, and pantoprazole.  His symptoms had largely resolved following this.  No test of cure has been conducted to date.  In the interim he developed COVID roughly 2 weeks ago.  He presented to the ED on 05/29/2023 with nausea and vomiting.  He was discharged home with Zofran and doxycycline.  CTAP with contrast at that time was unremarkable.  He presented to the internal medicine clinic on 06/02/2023 and  was directly admitted to our service following his presentation.  The patient presents with a 1 to 2-week history of nausea, anorexia, and vomiting.  It is worse with p.o. intake.  He denies any hematemesis, bilious emesis, or coffee-ground emesis.  He denies GERD like symptoms such as burning chest pain or acid reflux.  He denies dysphagia.  He has diffuse abdominal pain and cramping.  Phenergan has helped the patient most.  Zofran was avoided in the past due to serotonergic effects in combination with his multiple serotonergic medications.  He denies any changes in bowel habits and endorses to brown bowel movements a day.  Loose stools at times, no constipation no hematochezia.  He has a cough since his COVID infection, but otherwise denies chest pain.  Endorses chills but no fevers or night sweats.  He denies rashes or joint pain.  He feels that his symptoms are most similar to his H. pylori symptoms prior to treatment.     Meds:  Current Meds  Medication Sig   aspirin EC 81 MG tablet Take 1 tablet (81 mg total) by mouth daily. Swallow whole.   cloNIDine (CATAPRES) 0.2 MG tablet TAKE 1 TABLET(0.2 MG) BY MOUTH TWICE DAILY (Patient taking differently: Take 0.2 mg by mouth 2 (two) times daily.)   doxycycline (VIBRAMYCIN) 100 MG capsule Take 1 capsule (100 mg total) by mouth  2 (two) times daily for 7 days.   fluorouracil (EFUDEX) 5 % cream Apply 1 Application topically 2 (two) times daily.   FLUoxetine (PROZAC) 20 MG capsule TAKE 2 CAPSULES(40 MG) BY MOUTH DAILY (Patient taking differently: Take 40 mg by mouth daily.)   hydrOXYzine (ATARAX) 25 MG tablet Take 25 mg by mouth every 6 (six) hours as needed for anxiety or itching.   ibuprofen (ADVIL) 200 MG tablet Take 200 mg by mouth as needed.   lamoTRIgine (LAMICTAL) 100 MG tablet Take 1 tablet (100 mg total) by mouth 2 (two) times daily. Appointment needed for further refills   modafinil (PROVIGIL) 200 MG tablet TAKE 1 TABLET BY MOUTH DAILY    ondansetron (ZOFRAN-ODT) 4 MG disintegrating tablet Take 1 tablet (4 mg total) by mouth every 8 (eight) hours as needed.   pantoprazole (PROTONIX) 40 MG tablet Take 1 tablet (40 mg total) by mouth daily.   pregabalin (LYRICA) 150 MG capsule TAKE 1 CAPSULE(150 MG) BY MOUTH TWICE DAILY (Patient taking differently: Take 150 mg by mouth 2 (two) times daily.)   promethazine (PHENERGAN) 25 MG tablet Take 0.5-1 tablets (12.5-25 mg total) by mouth every 6 (six) hours as needed for nausea or vomiting.   QUEtiapine (SEROQUEL) 200 MG tablet Take 2 tablets (400 mg total) by mouth at bedtime.   RINVOQ 15 MG TB24 Take 15 mg by mouth daily.   zolpidem (AMBIEN) 10 MG tablet TAKE 1 TO 1 AND 1/2 TABLETS BY MOUTH AT NIGHT AS NEEDED FOR SLEEP (Patient taking differently: Take 10 mg by mouth at bedtime. TAKE 1 TO 1 AND 1/2 TABLETS BY MOUTH AT NIGHT AS NEEDED FOR SLEEP)    Past Medical History  Past Surgical History:  Procedure Laterality Date   BACK SURGERY     Fusion - lumbar   HIP PINNING,CANNULATED Right 06/24/2018   Procedure: RIGHT CANNULATED HIP PINNING;  Surgeon: Terance Hart, MD;  Location: [facility];  Service: Orthopedics;  Laterality: Right;   LITHOTRIPSY     MULTIPLE EXTRACTIONS WITH ALVEOLOPLASTY Bilateral 05/03/2019   Procedure: MULTIPLE EXTRACTION TEETH NUMBER TWO, THREE, FIVE, SIX, SEVEN, EIGHT, NINE, TEN, ELEVEN, FIFTEEN, SIXTEEN, SEVENTEEN, NINETEEN, TWENTY, TWENTY-ONE, TWENTY-TWO, TWENTY-THREE, TWENTY-FOUR, TWENTY-FIVE, TWENTY-SIX, TWENTY-SEVEN, TWENTY-EIGHT, TWENTY-NINE, THIRTY-TWO WITH ALVEOLOPLASTY;  Surgeon: Ocie Doyne, DDS;  Location: [facility] OR;  Service: Oral Surgery;  Laterality: Bilateral;   NOSE SURGERY     x3   TOTAL HIP ARTHROPLASTY Left 05/27/2019   Procedure: TOTAL HIP ARTHROPLASTY ANTERIOR APPROACH;  Surgeon: Jodi Geralds, MD;  Location: [facility];  Service: Orthopedics;  Laterality: Left;    Social:  Lives With: Wife at home Occupation: Retired Support: Family Level of  Function: Needs some help with housework but able to complete ADLs PCP: Kathleen Lime MD Substances:  34 pack year history. Would like to quit Former drinker up to 12-14 7oz beers a day. Last drink 6 mo ago per patient Denies history of IVDU, past history of recreational drug use, no recent.   Family History: family history includes Arthritis in an other family member; Cancer in an other family member; Heart disease in an other family member; Hypertension in an other family member.   Allergies: Allergies as of 06/02/2023 - Review Complete 06/02/2023  Allergen Reaction Noted   Other Other (See Comments) 02/24/2023   Codeine Nausea Only 01/21/2012    Review of Systems: A complete ROS was negative except as per HPI.   OBJECTIVE:   Physical Exam: Blood pressure 119/69, pulse (!) 46, temperature 98.1  F (36.7 C), temperature source Oral, SpO2 100%.  Constitutional: ill-appearing, in no acute distress Cardiovascular: regular rate and rhythm, no m/r/g Pulmonary/Chest: normal work of breathing on room air, lungs clear to auscultation bilaterally Abdominal: Diffuse tenderness to palpation of the abdomen. Worst in the Epigastrium and LUQ.  Extremities: Trace LE edema Skin: warm and dry Psych: Normal mood and affect  Labs: CBC    Component Value Date/Time   WBC 4.4 06/02/2023 1506   RBC 3.35 (L) 06/02/2023 1506   HGB 8.5 (L) 06/02/2023 1506   HGB 11.7 (L) 03/17/2022 0923   HCT 26.3 (L) 06/02/2023 1506   HCT 36.1 (L) 03/17/2022 0923   PLT 491 (H) 06/02/2023 1506   PLT 356 03/17/2022 0923   MCV 78.5 (L) 06/02/2023 1506   MCV 85 03/17/2022 0923   MCH 25.4 (L) 06/02/2023 1506   MCHC 32.3 06/02/2023 1506   RDW 14.7 06/02/2023 1506   RDW 14.5 03/17/2022 0923   LYMPHSABS 2.4 03/19/2023 1515   LYMPHSABS 2.9 03/17/2022 0923   MONOABS 1.1 (H) 03/19/2023 1515   EOSABS 0.0 03/19/2023 1515   EOSABS 0.1 03/17/2022 0923   BASOSABS 0.0 03/19/2023 1515   BASOSABS 0.0 03/17/2022 0923      CMP     Component Value Date/Time   NA 128 (L) 06/02/2023 1506   NA 139 03/17/2022 0923   K 4.1 06/02/2023 1506   CL 91 (L) 06/02/2023 1506   CO2 25 06/02/2023 1506   GLUCOSE 101 (H) 06/02/2023 1506   BUN 6 (L) 06/02/2023 1506   BUN 14 03/17/2022 0923   CREATININE 1.17 06/02/2023 1506   CREATININE TEST NOT PERFORMED 09/03/2012 1527   CALCIUM 8.8 (L) 06/02/2023 1506   PROT 6.8 06/02/2023 1506   PROT 6.7 03/17/2022 0923   ALBUMIN 3.3 (L) 06/02/2023 1506   ALBUMIN 4.3 03/17/2022 0923   AST 14 (L) 06/02/2023 1506   ALT 14 06/02/2023 1506   ALKPHOS 94 06/02/2023 1506   BILITOT 0.5 06/02/2023 1506   BILITOT <0.2 03/17/2022 0923   GFRNONAA >60 06/02/2023 1506   GFRNONAA TEST NOT PERFORMED 09/03/2012 1527   GFRAA >60 05/27/2020 0520   GFRAA TEST NOT PERFORMED 09/03/2012 1527    Imaging:  EKG: Pending  ASSESSMENT & PLAN:   Assessment & Plan by Problem: Principal Problem:   Nausea & vomiting   JB KODA is a 67 y.o. person living with seizure disorder, hypertension, tobacco use disorder, history of alcohol abuse, and mood disorders including GAD, MDD, episodic mood disorder. and admitted for nausea, vomiting, and inability tolerate p.o. on hospital day 0  #Nausea  #Vomiting  Unclear etiology much of this began in the past 1-2 weeks.  Differential would include GERD, peptic ulcer disease, post viral (Covid) nausea.  SBO less likely given recent normal CT on pelvis.  Gastric outlet obstruction possible.  Neoplasia not ruled out. Plan: - Phenergan as needed for nausea - Consider GI consult  #Seizure disorder Given patient has a history of seizure disorder with recent hospitalizations due to altered mental status and setting of missed medication doses as well as inability to tolerate p.o. I like to get a Lamictal level on this patient. -Lamictal level - Continue Lamictal   #Hyponatremia #Hypochloremia Favored d/t decreased PO intake, vomiting - Daily  BMP  #Chronic conditions -Continue home psych medications  - Holding BP in setting of normotension - holding ASA is setting of possible PUD #Microcytic anemia - Outpatient workup - Transfuse if hemoglobin less than  7 #Health maintenance - Would like flu and pneumonia vaccines while here  Diet:  Clear liquid VTE: Enoxaparin IVF: None,None Code: Full  Prior to Admission Living Arrangement:  Home Anticipated Discharge Location: Home Barriers to Discharge: Ability to tolerate p.o.  Dispo: Admit patient to Observation with expected length of stay less than 2 midnights.  Signed: Lovie Macadamia, MD Internal Medicine Resident PGY-1  06/02/2023, 6:29 PM

## 2023-06-02 NOTE — Hospital Course (Signed)
Throwing up food, no blood,  No dysphagia/odynophagia  Covid a couple weeks ago   No BM issues 2x a day  Some doe and cough No cp fevers  Yes chills  Chills at night   No abdominal surgery   Wife at home  Needs help with washing clothes and dishes (independent ADL) Can get around house  Retired Paediatric nurse (hairdresser)  On nad off with smoking 45 years 1ppd   Used to drink 12x 8 ox beers, stopped 8 months ago  9/18 - He remains nauseous but improved with phenergan. No vomiting. Some pain but not bad.

## 2023-06-02 NOTE — Telephone Encounter (Signed)
Call from patient's wife states patient is having the same symptoms as he had when he was diagnosed with H Pylori.  No diarrhea.  Vomiting Went to ER and is being treated for pneumonia.  Vomiting unable to hold down meds.  Had Covid also a few weeks ago/.  Just feels bad.  Given an appointment for this afternoon at 2:15 PM.  Is wanting to do a Telehealth if possible.  Patient did make appointment with his Psychiatrist on yesterday and got the Phenergan prescription there.

## 2023-06-02 NOTE — Assessment & Plan Note (Addendum)
David [name] is a 67 year old man with medical history significant for HTN, seizure disorder, GAD, AUD, Bipolar disorder, who came to the [facility] clinic for vomiting and nausea. Patient reports for the past two weeks he has been unable to keep down any solid food, promptly vomiting anything solid within minutes after eating. He has only been able to keep down very small amounts of liquids. He has not had any blood in his vomit, though has noticed some recent dark stools. He reports malaise and mild epigastric pain. Negative for fever, diarrhea, constipation, dysuria.   Patient went to the ED on 9/14 for same symptoms where a CT/AP was performed, which was negative for obstruction or other abdominal pathology. He also had an [facility] appointment on 7/16 for nausea and epigastric pain, at that time he had a positive H pylori breath test and completed a course of triple therapy with partial resolution of symptoms.   -Given patient's inability to keep down anything PO, patient will be directly admitted to IMTS to expedite being seen by GI -CBC, CMP, lactic acid ordered in clinic

## 2023-06-02 NOTE — Plan of Care (Signed)
Patient alert/oriented X4. Patient VSS upon arrival and attempted IV access twice, will place order for IV team. Patient has walker at bedside and has no complaints at this time. Will continue to monitor.

## 2023-06-03 DIAGNOSIS — R195 Other fecal abnormalities: Secondary | ICD-10-CM

## 2023-06-03 DIAGNOSIS — R634 Abnormal weight loss: Secondary | ICD-10-CM

## 2023-06-03 DIAGNOSIS — R112 Nausea with vomiting, unspecified: Secondary | ICD-10-CM

## 2023-06-03 DIAGNOSIS — G40909 Epilepsy, unspecified, not intractable, without status epilepticus: Secondary | ICD-10-CM | POA: Diagnosis not present

## 2023-06-03 DIAGNOSIS — D509 Iron deficiency anemia, unspecified: Secondary | ICD-10-CM

## 2023-06-03 MED ORDER — PANTOPRAZOLE SODIUM 40 MG IV SOLR
40.0000 mg | Freq: Two times a day (BID) | INTRAVENOUS | Status: DC
  Administered 2023-06-03 – 2023-06-04 (×2): 40 mg via INTRAVENOUS
  Filled 2023-06-03 (×2): qty 10

## 2023-06-03 MED ORDER — SODIUM CHLORIDE 0.9 % IV SOLN
12.5000 mg | Freq: Four times a day (QID) | INTRAVENOUS | Status: DC | PRN
  Administered 2023-06-03 – 2023-06-04 (×2): 12.5 mg via INTRAVENOUS
  Filled 2023-06-03 (×2): qty 12.5

## 2023-06-03 MED ORDER — CLONIDINE HCL 0.1 MG PO TABS
0.2000 mg | ORAL_TABLET | Freq: Two times a day (BID) | ORAL | Status: DC
  Administered 2023-06-03 – 2023-06-04 (×3): 0.2 mg via ORAL
  Filled 2023-06-03 (×4): qty 2

## 2023-06-03 MED ORDER — PEG-KCL-NACL-NASULF-NA ASC-C 100 G PO SOLR
0.5000 | Freq: Once | ORAL | Status: AC
  Administered 2023-06-04: 100 g via ORAL

## 2023-06-03 MED ORDER — ENOXAPARIN SODIUM 40 MG/0.4ML IJ SOSY: 40.0000 mg | PREFILLED_SYRINGE | INTRAMUSCULAR | Status: DC

## 2023-06-03 MED ORDER — PEG-KCL-NACL-NASULF-NA ASC-C 100 G PO SOLR
0.5000 | Freq: Once | ORAL | Status: AC
  Administered 2023-06-03: 100 g via ORAL
  Filled 2023-06-03 (×2): qty 1

## 2023-06-03 MED ORDER — PEG-KCL-NACL-NASULF-NA ASC-C 100 G PO SOLR: 1.0000 | Freq: Once | ORAL | Status: DC

## 2023-06-03 NOTE — Care Management Obs Status (Signed)
MEDICARE OBSERVATION STATUS NOTIFICATION   Patient Details  Name: David [name] MRN: [id number] Date of Birth: [birth year]   Medicare Observation Status Notification Given:  Yes    Lawerance Sabal, RN 06/03/2023, 2:03 PM

## 2023-06-03 NOTE — Plan of Care (Signed)
  Problem: Education: Goal: Knowledge of General Education information will improve Description: Including pain rating scale, medication(s)/side effects and non-pharmacologic comfort measures 06/03/2023 0029 by Tennis Ship, RN Outcome: Progressing 06/03/2023 0029 by Tennis Ship, RN Outcome: Progressing 06/03/2023 0029 by Tennis Ship, RN Outcome: Progressing   Problem: Health Behavior/Discharge Planning: Goal: Ability to manage health-related needs will improve 06/03/2023 0029 by Tennis Ship, RN Outcome: Progressing 06/03/2023 0029 by Tennis Ship, RN Outcome: Progressing 06/03/2023 0029 by Tennis Ship, RN Outcome: Progressing   Problem: Clinical Measurements: Goal: Ability to maintain clinical measurements within normal limits will improve 06/03/2023 0029 by Tennis Ship, RN Outcome: Progressing 06/03/2023 0029 by Tennis Ship, RN Outcome: Progressing 06/03/2023 0029 by Tennis Ship, RN Outcome: Progressing Goal: Will remain free from infection 06/03/2023 0029 by Tennis Ship, RN Outcome: Progressing 06/03/2023 0029 by Tennis Ship, RN Outcome: Progressing 06/03/2023 0029 by Tennis Ship, RN Outcome: Progressing Goal: Diagnostic test results will improve 06/03/2023 0029 by Tennis Ship, RN Outcome: Progressing 06/03/2023 0029 by Tennis Ship, RN Outcome: Progressing 06/03/2023 0029 by Tennis Ship, RN Outcome: Progressing Goal: Respiratory complications will improve 06/03/2023 0029 by Tennis Ship, RN Outcome: Progressing 06/03/2023 0029 by Tennis Ship, RN Outcome: Progressing 06/03/2023 0029 by Tennis Ship, RN Outcome: Progressing Goal: Cardiovascular complication will be avoided 06/03/2023 0029 by Tennis Ship, RN Outcome: Progressing 06/03/2023 0029 by Tennis Ship, RN Outcome: Progressing 06/03/2023 0029 by Tennis Ship, RN Outcome: Progressing    Problem: Activity: Goal: Risk for activity intolerance will decrease 06/03/2023 0029 by Tennis Ship, RN Outcome: Progressing 06/03/2023 0029 by Tennis Ship, RN Outcome: Progressing 06/03/2023 0029 by Tennis Ship, RN Outcome: Progressing   Problem: Nutrition: Goal: Adequate nutrition will be maintained 06/03/2023 0029 by Tennis Ship, RN Outcome: Progressing 06/03/2023 0029 by Tennis Ship, RN Outcome: Progressing 06/03/2023 0029 by Tennis Ship, RN Outcome: Progressing   Problem: Coping: Goal: Level of anxiety will decrease 06/03/2023 0029 by Tennis Ship, RN Outcome: Progressing 06/03/2023 0029 by Tennis Ship, RN Outcome: Progressing 06/03/2023 0029 by Tennis Ship, RN Outcome: Progressing   Problem: Elimination: Goal: Will not experience complications related to bowel motility 06/03/2023 0029 by Tennis Ship, RN Outcome: Progressing 06/03/2023 0029 by Tennis Ship, RN Outcome: Progressing 06/03/2023 0029 by Tennis Ship, RN Outcome: Progressing Goal: Will not experience complications related to urinary retention 06/03/2023 0029 by Tennis Ship, RN Outcome: Progressing 06/03/2023 0029 by Tennis Ship, RN Outcome: Progressing 06/03/2023 0029 by Tennis Ship, RN Outcome: Progressing   Problem: Pain Managment: Goal: General experience of comfort will improve 06/03/2023 0029 by Tennis Ship, RN Outcome: Progressing 06/03/2023 0029 by Tennis Ship, RN Outcome: Progressing 06/03/2023 0029 by Tennis Ship, RN Outcome: Progressing   Problem: Safety: Goal: Ability to remain free from injury will improve 06/03/2023 0029 by Tennis Ship, RN Outcome: Progressing 06/03/2023 0029 by Tennis Ship, RN Outcome: Progressing 06/03/2023 0029 by Tennis Ship, RN Outcome: Progressing   Problem: Skin Integrity: Goal: Risk for impaired skin integrity will  decrease 06/03/2023 0029 by Tennis Ship, RN Outcome: Progressing 06/03/2023 0029 by Tennis Ship, RN Outcome: Progressing 06/03/2023 0029 by Tennis Ship, RN Outcome: Progressing

## 2023-06-03 NOTE — Plan of Care (Signed)
Patient alert/oriented X4. Patient compliant with medication administration and complained of nausea after his breakfast. Patient pending bowel prep administration. Recent BP 212/93, MD aware.   Problem: Education: Goal: Knowledge of General Education information will improve Description: Including pain rating scale, medication(s)/side effects and non-pharmacologic comfort measures Outcome: Progressing   Problem: Health Behavior/Discharge Planning: Goal: Ability to manage health-related needs will improve Outcome: Progressing   Problem: Clinical Measurements: Goal: Ability to maintain clinical measurements within normal limits will improve Outcome: Progressing   Problem: Clinical Measurements: Goal: Will remain free from infection Outcome: Progressing   Problem: Clinical Measurements: Goal: Diagnostic test results will improve Outcome: Progressing   Problem: Clinical Measurements: Goal: Respiratory complications will improve Outcome: Progressing   Problem: Clinical Measurements: Goal: Cardiovascular complication will be avoided Outcome: Progressing   Problem: Activity: Goal: Risk for activity intolerance will decrease Outcome: Progressing   Problem: Nutrition: Goal: Adequate nutrition will be maintained Outcome: Progressing   Problem: Coping: Goal: Level of anxiety will decrease Outcome: Progressing   Problem: Elimination: Goal: Will not experience complications related to bowel motility Outcome: Progressing   Problem: Elimination: Goal: Will not experience complications related to urinary retention Outcome: Progressing   Problem: Pain Managment: Goal: General experience of comfort will improve Outcome: Progressing   Problem: Safety: Goal: Ability to remain free from injury will improve Outcome: Progressing   Problem: Skin Integrity: Goal: Risk for impaired skin integrity will decrease Outcome: Progressing

## 2023-06-03 NOTE — Plan of Care (Signed)

## 2023-06-03 NOTE — Progress Notes (Signed)
HD#0 Subjective:   Summary: David [name] is a 67 y.o. male with pertinent PMH of seizure disorder, hypertension, tobacco use disorder, history of alcohol abuse, mood disorders including GAD, MDD, and episodic mood disorder.  He was directly admitted from the [facility] due to persistent nausea and vomiting is admitted for GI workup in the setting of epigastric pain and inability to tolerate p.o.  Patient was able to tolerate clear liquids, but when transition to full liquid diet became very nauseous.  She has had no nausea or vomiting while here and feels the Phenergan has helped him the most.  He has continued epigastric pain, and very little ability to tolerate p.o. Given these findings we discussed getting GI involved with the patient.  Objective:  Vital signs in last 24 hours: Vitals:   06/03/23 0359 06/03/23 0400 06/03/23 0733 06/03/23 1600  BP: (!) 152/92  (!) 164/83 (!) 212/93  Pulse: (!) 56  (!) 55   Temp: 97.7 F (36.5 C)  (!) 97.4 F (36.3 C)   TempSrc: Oral  Oral   SpO2: 100%  96%   Weight:  80.2 kg    Height:  5\' 8"  (1.727 m)     Supplemental O2: Room Air SpO2: 96 %   Physical Exam:  Constitutional: Well-appearing in no acute distress Cardiovascular: regular rate and rhythm, no m/r/g Pulmonary/Chest: normal work of breathing on room air, mild wheezing to auscultation bilaterally Abdominal: Diffuse tenderness to palpation of the abdomen with pronounced epigastric and left upper quadrant tenderness.  No rebound or guarding. Extremities: Trace Skin: warm and dry Psych: Cheerful mood and affect  Filed Weights   06/03/23 0400  Weight: 80.2 kg     No intake or output data in the 24 hours ending 06/03/23 1640 Net IO Since Admission: No IO data has been entered for this period [06/03/23 1640]  Pertinent Labs:    Latest Ref Rng & Units 06/03/2023   10:10 AM 06/02/2023    3:06 PM 05/29/2023    8:46 PM  CBC  WBC 4.0 - 10.5 K/uL 5.0  4.4  9.8   Hemoglobin 13.0 - 17.0  g/dL 9.6  8.5  9.0   Hematocrit 39.0 - 52.0 % 29.1  26.3  27.1   Platelets 150 - 400 K/uL 482  491  531        Latest Ref Rng & Units 06/03/2023   10:10 AM 06/02/2023    3:06 PM 05/29/2023    8:46 PM  CMP  Glucose 70 - 99 mg/dL 161  096  89   BUN 8 - 23 mg/dL 6  6  9    Creatinine 0.61 - 1.24 mg/dL 0.45  4.09  8.11   Sodium 135 - 145 mmol/L 133  128  129   Potassium 3.5 - 5.1 mmol/L 3.6  4.1  4.1   Chloride 98 - 111 mmol/L 100  91  98   CO2 22 - 32 mmol/L 24  25  19    Calcium 8.9 - 10.3 mg/dL 8.8  8.8  8.6   Total Protein 6.5 - 8.1 g/dL 6.9  6.8  7.1   Total Bilirubin 0.3 - 1.2 mg/dL 0.6  0.5  0.4   Alkaline Phos 38 - 126 U/L 91  94  89   AST 15 - 41 U/L 15  14  25    ALT 0 - 44 U/L 12  14  22      Assessment/Plan:   Principal Problem:   Nausea &  vomiting   Patient Summary: David [name] is a 67 y.o. male with pertinent PMH of seizure disorder, hypertension, tobacco use disorder, history of alcohol abuse, mood disorders including GAD, MDD, and episodic mood disorder.  He was directly admitted from the [facility] due to persistent nausea and vomiting is admitted for GI workup in the setting of epigastric pain and inability to tolerate p.o.  He is on on hospital day 0.   #Nausea  #Vomiting  Unclear etiology much of this began in the past 1-2 weeks.  Differential would include GERD, peptic ulcer disease, post viral (Covid) nausea.  SBO less likely given recent normal CT on pelvis.  Gastric outlet obstruction possible.  Neoplasia not ruled out.  This case was discussed with GI who will consult on the patient.  We appreciate their help. Plan: - Phenergan as needed for nausea - Continue full liquid diet. -Pending GI consult   #Seizure disorder Given patient has a history of seizure disorder with recent hospitalizations due to altered mental status and setting of missed medication doses as well as inability to tolerate p.o. scratch that - Lamictal trough level ordered.    #Hyponatremia #Hypochloremia-resolved Favored d/t decreased PO intake, vomiting.  Improving - Daily BMP   #Chronic conditions -Continue home psych medications  - Resume home BP medications. - holding ASA is setting of possible PUD #Microcytic anemia - Outpatient workup - Transfuse if hemoglobin less than 7 #Health maintenance - Would like flu and pneumonia vaccines while here  Diet:  Full liquid IVF: None,None VTE: Enoxaparin Code: Full PT/OT recs: None, none.   Dispo: Anticipated discharge to Home in 2 days pending GI workup.   Lovie Macadamia MD Internal Medicine Resident PGY-1 Pager: [phone] Please contact the on call pager after 5 pm and on weekends at [phone].

## 2023-06-03 NOTE — Consult Note (Signed)
Consultation  Referring Provider: Mercie Eon, MD/med TS Primary Care Physician:  Kathleen Lime, MD Primary Gastroenterologist: None/unassigned  Reason for Consultation: Abdominal pain nausea and vomiting  HPI: David [name] is a 67 y.o. male who was admitted to the medicine service yesterday with complaints of nausea and vomiting over the past 2 weeks with very poor oral intake.  Patient has not had any prior GI evaluation.  He says he thinks he had COVID about 3 weeks ago, had symptoms of fever, aching all over nausea vomiting Cetera.  He was having some issues with his stomach earlier in the summer and had undergone H. pylori breath testing through the medicine service in July.  That was positive and he was given a course of antibiotics which he says he did complete.  He has not stayed on PPI.  He is not sure whether he took a PPI with that course of medication or not. Over the past 3 weeks he has had persistent symptoms on a daily basis and says he just does not feel like himself.  He has not been having diarrhea but thinks he has seen some dark stools off and on, no overt blood. He uses occasional Advil denies any daily use of NSAIDs, says that he stopped drinking about 6 months ago and also quit using cannabis about 6 months ago and is currently trying to stop smoking.  His other medical problems include ADD, bipolar disorder, PTSD, generalized anxiety/depression and arthritis. Had undergone CT of the abdomen pelvis on 05/30/2023 which showed trace pleural effusions but otherwise unremarkable. Labs on admission yesterday with WBC of 4.4/hemoglobin 8.5/Mehta crit 26.3/MCV 78.5 Sodium 128/BUN 6/creatinine 1.17/albumin 3.3 and LFTs within normal limits, lipase normal  Today WBC 5.0/hemoglobin 9.6/hematocrit 29.1/MCV 79.1 Sodium 133/potassium 3.6/BUN 6/creatinine 1.19  Reviewing previous labs-hemoglobin 8.14 February 2023 hemoglobin 10.275 2024- hemoglobin 11.21 March 2022 Ferritin 02/2023  31 Iron 28/TIBC 389/iron sat 7 Noted Hemoccult positive in the ER 3 months ago   He has been given Phenergan today and says that has helped quite a bit with the nausea he was able to keep some grits down this morning. Is unaware that he has been anemic-he has not aware of any prior diagnosis of anemia   Past Medical History:  Diagnosis Date   ADD (attention deficit disorder)    Anemia    Microcytic   Arthritis    Bipolar disorder (HCC)    Depression    Dyspnea    history of no current issues 05/26/2019   GAD (generalized anxiety disorder)    History of kidney stones    passed   HTN (hypertension)    PTSD (post-traumatic stress disorder)    Skin cancer     Past Surgical History:  Procedure Laterality Date   BACK SURGERY     Fusion - lumbar   HIP PINNING,CANNULATED Right 06/24/2018   Procedure: RIGHT CANNULATED HIP PINNING;  Surgeon: Terance Hart, MD;  Location: [facility];  Service: Orthopedics;  Laterality: Right;   LITHOTRIPSY     MULTIPLE EXTRACTIONS WITH ALVEOLOPLASTY Bilateral 05/03/2019   Procedure: MULTIPLE EXTRACTION TEETH NUMBER TWO, THREE, FIVE, SIX, SEVEN, EIGHT, NINE, TEN, ELEVEN, FIFTEEN, SIXTEEN, SEVENTEEN, NINETEEN, TWENTY, TWENTY-ONE, TWENTY-TWO, TWENTY-THREE, TWENTY-FOUR, TWENTY-FIVE, TWENTY-SIX, TWENTY-SEVEN, TWENTY-EIGHT, TWENTY-NINE, THIRTY-TWO WITH ALVEOLOPLASTY;  Surgeon: Ocie Doyne, DDS;  Location: [facility];  Service: Oral Surgery;  Laterality: Bilateral;   NOSE SURGERY     x3   TOTAL HIP ARTHROPLASTY Left 05/27/2019   Procedure: TOTAL HIP  ARTHROPLASTY ANTERIOR APPROACH;  Surgeon: Jodi Geralds, MD;  Location: [facility];  Service: Orthopedics;  Laterality: Left;    Prior to Admission medications   Medication Sig Start Date End Date Taking? Authorizing Provider  aspirin EC 81 MG tablet Take 1 tablet (81 mg total) by mouth daily. Swallow whole. 03/20/23 03/19/24 Yes Ghimire, Werner Lean, MD  cloNIDine (CATAPRES) 0.2 MG tablet TAKE 1 TABLET(0.2 MG) BY MOUTH  TWICE DAILY Patient taking differently: Take 0.2 mg by mouth 2 (two) times daily. 01/05/23  Yes Cottle, Steva Ready., MD  doxycycline (VIBRAMYCIN) 100 MG capsule Take 1 capsule (100 mg total) by mouth 2 (two) times daily for 7 days. 05/30/23 06/06/23 Yes Long, Arlyss Repress, MD  fluorouracil (EFUDEX) 5 % cream Apply 1 Application topically 2 (two) times daily. 04/27/23  Yes [provider]  FLUoxetine (PROZAC) 20 MG capsule TAKE 2 CAPSULES(40 MG) BY MOUTH DAILY Patient taking differently: Take 40 mg by mouth daily. 04/02/23  Yes Cottle, Steva Ready., MD  hydrOXYzine (ATARAX) 25 MG tablet Take 25 mg by mouth every 6 (six) hours as needed for anxiety or itching.   Yes [provider]  ibuprofen (ADVIL) 200 MG tablet Take 200 mg by mouth as needed.   Yes [provider]  lamoTRIgine (LAMICTAL) 100 MG tablet Take 1 tablet (100 mg total) by mouth 2 (two) times daily. Appointment needed for further refills 01/26/23  Yes Levert Feinstein, MD  modafinil (PROVIGIL) 200 MG tablet TAKE 1 TABLET BY MOUTH DAILY 04/02/23  Yes Cottle, Steva Ready., MD  ondansetron (ZOFRAN-ODT) 4 MG disintegrating tablet Take 1 tablet (4 mg total) by mouth every 8 (eight) hours as needed. 05/30/23  Yes Long, Arlyss Repress, MD  pantoprazole (PROTONIX) 40 MG tablet Take 1 tablet (40 mg total) by mouth daily. 03/31/23  Yes Kathleen Lime, MD  pregabalin (LYRICA) 150 MG capsule TAKE 1 CAPSULE(150 MG) BY MOUTH TWICE DAILY Patient taking differently: Take 150 mg by mouth 2 (two) times daily. 05/13/23  Yes Cottle, Steva Ready., MD  promethazine (PHENERGAN) 25 MG tablet Take 0.5-1 tablets (12.5-25 mg total) by mouth every 6 (six) hours as needed for nausea or vomiting. 06/01/23  Yes Cottle, Steva Ready., MD  QUEtiapine (SEROQUEL) 200 MG tablet Take 2 tablets (400 mg total) by mouth at bedtime. 04/12/23  Yes Cottle, Steva Ready., MD  RINVOQ 15 MG TB24 Take 15 mg by mouth daily.   Yes [provider]  zolpidem (AMBIEN) 10 MG tablet TAKE 1  TO 1 AND 1/2 TABLETS BY MOUTH AT NIGHT AS NEEDED FOR SLEEP Patient taking differently: Take 10 mg by mouth at bedtime. TAKE 1 TO 1 AND 1/2 TABLETS BY MOUTH AT NIGHT AS NEEDED FOR SLEEP 04/02/23  Yes Cottle, Steva Ready., MD  nicotine (NICODERM CQ) 21 mg/24hr patch Place 1 patch (21 mg total) onto the skin daily. 06/01/23   Cottle, Steva Ready., MD  propranolol (INDERAL) 10 MG tablet TAKE 1 TO 4 TABLETS BY MOUTH TWICE DAILY AS NEEDED FOR TREMORS 05/10/23   Cottle, Steva Ready., MD    Current Facility-Administered Medications  Medication Dose Route Frequency Provider Last Rate Last Admin   cloNIDine (CATAPRES) tablet 0.2 mg  0.2 mg Oral BID Masters, Katie, DO   0.2 mg at 06/03/23 1615   enoxaparin (LOVENOX) injection 40 mg  40 mg Subcutaneous Q24H Rocky Morel, DO   40 mg at 06/02/23 2056   FLUoxetine (PROZAC) capsule 40 mg  40  mg Oral Daily Lovie Macadamia, MD   40 mg at 06/03/23 0865   hydrOXYzine (ATARAX) tablet 25 mg  25 mg Oral Q6H PRN Lovie Macadamia, MD       lamoTRIgine (LAMICTAL) tablet 100 mg  100 mg Oral BID Lovie Macadamia, MD   100 mg at 06/03/23 7846   nicotine (NICODERM CQ - dosed in mg/24 hours) patch 21 mg  21 mg Transdermal Daily Lovie Macadamia, MD   21 mg at 06/03/23 9629   pantoprazole (PROTONIX) EC tablet 40 mg  40 mg Oral Daily Lovie Macadamia, MD   40 mg at 06/03/23 5284   pregabalin (LYRICA) capsule 150 mg  150 mg Oral BID Lovie Macadamia, MD   150 mg at 06/03/23 1324   promethazine (PHENERGAN) tablet 25 mg  25 mg Oral Q6H PRN Rocky Morel, DO   25 mg at 06/03/23 1616   Upadacitinib ER TB24 15 mg  15 mg Oral Daily Lovie Macadamia, MD       zolpidem (AMBIEN) tablet 10 mg  10 mg Oral QHS Lovie Macadamia, MD   10 mg at 06/02/23 2057    Allergies as of 06/02/2023 - Review Complete 06/02/2023  Allergen Reaction Noted   Other Other (See Comments) 02/24/2023   Codeine Nausea Only 01/21/2012    Family History  Problem Relation Age of Onset    Arthritis Other    Heart disease Other    Cancer Other    Hypertension Other     Social History   Socioeconomic History   Marital status: Married    Spouse name: Not on file   Number of children: Not on file   Years of education: Not on file   Highest education level: Not on file  Occupational History   Not on file  Tobacco Use   Smoking status: Every Day    Current packs/day: 0.00    Average packs/day: 1 pack/day for 40.0 years (40.0 ttl pk-yrs)    Types: Cigarettes    Start date: 04/25/1979    Last attempt to quit: 04/25/2019    Years since quitting: 4.1   Smokeless tobacco: Never  Vaping Use   Vaping status: Never Used  Substance and Sexual Activity   Alcohol use: Yes    Comment: daily-6-7/day   Drug use: Not Currently    Types: Marijuana   Sexual activity: Not on file  Other Topics Concern   Not on file  Social History Narrative   Not on file   Social Determinants of Health   Financial Resource Strain: Medium Risk (06/23/2018)   Overall Financial Resource Strain (CARDIA)    Difficulty of Paying Living Expenses: Somewhat hard  Food Insecurity: No Food Insecurity (06/02/2023)   Hunger Vital Sign    Worried About Running Out of Food in the Last Year: Never true    Ran Out of Food in the Last Year: Never true  Transportation Needs: Unmet Transportation Needs (06/02/2023)   PRAPARE - Transportation    Lack of Transportation (Medical): Yes    Lack of Transportation (Non-Medical): Yes  Physical Activity: Insufficiently Active (06/23/2018)   Exercise Vital Sign    Days of Exercise per Week: 4 days    Minutes of Exercise per Session: 30 min  Stress: Unknown (06/23/2018)   [employer] of Occupational Health - Occupational Stress Questionnaire    Feeling of Stress : Patient declined  Social Connections: Unknown (01/18/2022)   Received from [facility]   Social Network  Social Network: Not on file  Intimate Partner Violence: Not At Risk  (06/02/2023)   Humiliation, Afraid, Rape, and Kick questionnaire    Fear of Current or Ex-Partner: No    Emotionally Abused: No    Physically Abused: No    Sexually Abused: No    Review of Systems: Pertinent positive and negative review of systems were noted in the above HPI section.  All other review of systems was otherwise negative.   Physical Exam: Vital signs in last 24 hours: Temp:  [97.4 F (36.3 C)-98.1 F (36.7 C)] 97.4 F (36.3 C) (09/18 0733) Pulse Rate:  [46-56] 55 (09/18 0733) BP: (119-212)/(69-93) 212/93 (09/18 1600) SpO2:  [96 %-100 %] 96 % (09/18 0733) Weight:  [80.2 kg] 80.2 kg (09/18 0400) Last BM Date : 06/02/23 General:   Alert,  Well-developed, well-nourished,older WM pleasant and cooperative in NAD Head:  Normocephalic and atraumatic. Eyes:  Sclera clear, no icterus.   Conjunctiva pink. Ears:  Normal auditory acuity. Nose:  No deformity, discharge,  or lesions. Mouth:  No deformity or lesions.   Neck:  Supple; no masses or thyromegaly. Lungs:  Clear throughout to auscultation.   No wheezes, crackles, or rhonchi.  Heart:  Regular rate and rhythm; no murmurs, clicks, rubs,  or gallops. Abdomen:  Soft, BS active, there is tenderness in the epigastrium/hypogastrium, no rebound nonpalp mass or hsm.   Rectal: Not done Msk:  Symmetrical without gross deformities. . Pulses:  Normal pulses noted. Extremities:  Without clubbing or edema. Neurologic:  Alert and  oriented x4;  grossly normal neurologically. Skin:  Intact without significant lesions or rashes.. Psych:  Alert and cooperative. Normal mood and affect.  Intake/Output from previous day: No intake/output data recorded. Intake/Output this shift: No intake/output data recorded.  Lab Results: Recent Labs    06/02/23 1506 06/03/23 1010  WBC 4.4 5.0  HGB 8.5* 9.6*  HCT 26.3* 29.1*  PLT 491* 482*   BMET Recent Labs    06/02/23 1506 06/03/23 1010  NA 128* 133*  K 4.1 3.6  CL 91* 100  CO2 25  24  GLUCOSE 101* 115*  BUN 6* 6*  CREATININE 1.17 1.19  CALCIUM 8.8* 8.8*   LFT Recent Labs    06/03/23 1010  PROT 6.9  ALBUMIN 3.3*  AST 15  ALT 12  ALKPHOS 91  BILITOT 0.6   PT/INR No results for input(s): "LABPROT", "INR" in the last 72 hours. Hepatitis Panel No results for input(s): "HEPBSAG", "HCVAB", "HEPAIGM", "HEPBIGM" in the last 72 hours.   IMPRESSION:  #13 67 year old white male with 8-month history of upper abdominal discomfort, intermittent nausea and vomiting which has progressed over the past 2 to 3 weeks to occurring on a daily basis. H. pylori positive earlier this summer and treated has not had eradication confirmed  Etiology of the nausea and vomiting is not clear, he has also developed an iron deficiency anemia which has not been worked up.  Will need to rule out chronic ulcer disease, occult gastric lesion/neoplasm, chronic gastropathy, consider colonic neoplasm  #2 iron deficiency anemia as above Documented heme + June 2024  #3 ADD/bipolar disease/PTSD/anxiety/depression #4 previous chronic EtOH use stopped 6 months ago  Plan; continue Phenergan every 6 hours as needed PPI twice daily Continue to trend hemoglobin and transfuse as indicated for hemoglobin 7 or less Discussed EGD and colonoscopy with the patient, I am not certain that he will be able to tolerate a bowel prep just yet but he wants to  try.  Will plan to schedule him for EGD and colonoscopy with Dr. Tomasa Rand tomorrow again both procedures discussed in detail including indications risks and benefits and he is agreeable to proceed. If he cannot tolerate bowel prep then we can proceed with EGD tomorrow. GI will follow with you   Elianis Fischbach PA-C 06/03/2023, 4:47 PM

## 2023-06-03 NOTE — H&P (View-Only) (Signed)
Consultation  Referring Provider: Mercie Eon, MD/med TS Primary Care Physician:  Kathleen Lime, MD Primary Gastroenterologist: None/unassigned  Reason for Consultation: Abdominal pain nausea and vomiting  HPI: David [name] is a 67 y.o. male who was admitted to the medicine service yesterday with complaints of nausea and vomiting over the past 2 weeks with very poor oral intake.  Patient has not had any prior GI evaluation.  He says he thinks he had COVID about 3 weeks ago, had symptoms of fever, aching all over nausea vomiting Cetera.  He was having some issues with his stomach earlier in the summer and had undergone H. pylori breath testing through the medicine service in July.  That was positive and he was given a course of antibiotics which he says he did complete.  He has not stayed on PPI.  He is not sure whether he took a PPI with that course of medication or not. Over the past 3 weeks he has had persistent symptoms on a daily basis and says he just does not feel like himself.  He has not been having diarrhea but thinks he has seen some dark stools off and on, no overt blood. He uses occasional Advil denies any daily use of NSAIDs, says that he stopped drinking about 6 months ago and also quit using cannabis about 6 months ago and is currently trying to stop smoking.  His other medical problems include ADD, bipolar disorder, PTSD, generalized anxiety/depression and arthritis. Had undergone CT of the abdomen pelvis on 05/30/2023 which showed trace pleural effusions but otherwise unremarkable. Labs on admission yesterday with WBC of 4.4/hemoglobin 8.5/Mehta crit 26.3/MCV 78.5 Sodium 128/BUN 6/creatinine 1.17/albumin 3.3 and LFTs within normal limits, lipase normal  Today WBC 5.0/hemoglobin 9.6/hematocrit 29.1/MCV 79.1 Sodium 133/potassium 3.6/BUN 6/creatinine 1.19  Reviewing previous labs-hemoglobin 8.14 February 2023 hemoglobin 10.275 2024- hemoglobin 11.21 March 2022 Ferritin 02/2023  31 Iron 28/TIBC 389/iron sat 7 Noted Hemoccult positive in the ER 3 months ago   He has been given Phenergan today and says that has helped quite a bit with the nausea he was able to keep some grits down this morning. Is unaware that he has been anemic-he has not aware of any prior diagnosis of anemia   Past Medical History:  Diagnosis Date   ADD (attention deficit disorder)    Anemia    Microcytic   Arthritis    Bipolar disorder (HCC)    Depression    Dyspnea    history of no current issues 05/26/2019   GAD (generalized anxiety disorder)    History of kidney stones    passed   HTN (hypertension)    PTSD (post-traumatic stress disorder)    Skin cancer     Past Surgical History:  Procedure Laterality Date   BACK SURGERY     Fusion - lumbar   HIP PINNING,CANNULATED Right 06/24/2018   Procedure: RIGHT CANNULATED HIP PINNING;  Surgeon: Terance Hart, MD;  Location: [facility];  Service: Orthopedics;  Laterality: Right;   LITHOTRIPSY     MULTIPLE EXTRACTIONS WITH ALVEOLOPLASTY Bilateral 05/03/2019   Procedure: MULTIPLE EXTRACTION TEETH NUMBER TWO, THREE, FIVE, SIX, SEVEN, EIGHT, NINE, TEN, ELEVEN, FIFTEEN, SIXTEEN, SEVENTEEN, NINETEEN, TWENTY, TWENTY-ONE, TWENTY-TWO, TWENTY-THREE, TWENTY-FOUR, TWENTY-FIVE, TWENTY-SIX, TWENTY-SEVEN, TWENTY-EIGHT, TWENTY-NINE, THIRTY-TWO WITH ALVEOLOPLASTY;  Surgeon: Ocie Doyne, DDS;  Location: [facility];  Service: Oral Surgery;  Laterality: Bilateral;   NOSE SURGERY     x3   TOTAL HIP ARTHROPLASTY Left 05/27/2019   Procedure: TOTAL HIP  ARTHROPLASTY ANTERIOR APPROACH;  Surgeon: Jodi Geralds, MD;  Location: [facility];  Service: Orthopedics;  Laterality: Left;    Prior to Admission medications   Medication Sig Start Date End Date Taking? Authorizing Provider  aspirin EC 81 MG tablet Take 1 tablet (81 mg total) by mouth daily. Swallow whole. 03/20/23 03/19/24 Yes Ghimire, Werner Lean, MD  cloNIDine (CATAPRES) 0.2 MG tablet TAKE 1 TABLET(0.2 MG) BY MOUTH  TWICE DAILY Patient taking differently: Take 0.2 mg by mouth 2 (two) times daily. 01/05/23  Yes Cottle, Steva Ready., MD  doxycycline (VIBRAMYCIN) 100 MG capsule Take 1 capsule (100 mg total) by mouth 2 (two) times daily for 7 days. 05/30/23 06/06/23 Yes Long, Arlyss Repress, MD  fluorouracil (EFUDEX) 5 % cream Apply 1 Application topically 2 (two) times daily. 04/27/23  Yes [provider]  FLUoxetine (PROZAC) 20 MG capsule TAKE 2 CAPSULES(40 MG) BY MOUTH DAILY Patient taking differently: Take 40 mg by mouth daily. 04/02/23  Yes Cottle, Steva Ready., MD  hydrOXYzine (ATARAX) 25 MG tablet Take 25 mg by mouth every 6 (six) hours as needed for anxiety or itching.   Yes [provider]  ibuprofen (ADVIL) 200 MG tablet Take 200 mg by mouth as needed.   Yes [provider]  lamoTRIgine (LAMICTAL) 100 MG tablet Take 1 tablet (100 mg total) by mouth 2 (two) times daily. Appointment needed for further refills 01/26/23  Yes Levert Feinstein, MD  modafinil (PROVIGIL) 200 MG tablet TAKE 1 TABLET BY MOUTH DAILY 04/02/23  Yes Cottle, Steva Ready., MD  ondansetron (ZOFRAN-ODT) 4 MG disintegrating tablet Take 1 tablet (4 mg total) by mouth every 8 (eight) hours as needed. 05/30/23  Yes Long, Arlyss Repress, MD  pantoprazole (PROTONIX) 40 MG tablet Take 1 tablet (40 mg total) by mouth daily. 03/31/23  Yes Kathleen Lime, MD  pregabalin (LYRICA) 150 MG capsule TAKE 1 CAPSULE(150 MG) BY MOUTH TWICE DAILY Patient taking differently: Take 150 mg by mouth 2 (two) times daily. 05/13/23  Yes Cottle, Steva Ready., MD  promethazine (PHENERGAN) 25 MG tablet Take 0.5-1 tablets (12.5-25 mg total) by mouth every 6 (six) hours as needed for nausea or vomiting. 06/01/23  Yes Cottle, Steva Ready., MD  QUEtiapine (SEROQUEL) 200 MG tablet Take 2 tablets (400 mg total) by mouth at bedtime. 04/12/23  Yes Cottle, Steva Ready., MD  RINVOQ 15 MG TB24 Take 15 mg by mouth daily.   Yes [provider]  zolpidem (AMBIEN) 10 MG tablet TAKE 1  TO 1 AND 1/2 TABLETS BY MOUTH AT NIGHT AS NEEDED FOR SLEEP Patient taking differently: Take 10 mg by mouth at bedtime. TAKE 1 TO 1 AND 1/2 TABLETS BY MOUTH AT NIGHT AS NEEDED FOR SLEEP 04/02/23  Yes Cottle, Steva Ready., MD  nicotine (NICODERM CQ) 21 mg/24hr patch Place 1 patch (21 mg total) onto the skin daily. 06/01/23   Cottle, Steva Ready., MD  propranolol (INDERAL) 10 MG tablet TAKE 1 TO 4 TABLETS BY MOUTH TWICE DAILY AS NEEDED FOR TREMORS 05/10/23   Cottle, Steva Ready., MD    Current Facility-Administered Medications  Medication Dose Route Frequency Provider Last Rate Last Admin   cloNIDine (CATAPRES) tablet 0.2 mg  0.2 mg Oral BID Masters, Katie, DO   0.2 mg at 06/03/23 1615   enoxaparin (LOVENOX) injection 40 mg  40 mg Subcutaneous Q24H Rocky Morel, DO   40 mg at 06/02/23 2056   FLUoxetine (PROZAC) capsule 40 mg  40  mg Oral Daily Lovie Macadamia, MD   40 mg at 06/03/23 0865   hydrOXYzine (ATARAX) tablet 25 mg  25 mg Oral Q6H PRN Lovie Macadamia, MD       lamoTRIgine (LAMICTAL) tablet 100 mg  100 mg Oral BID Lovie Macadamia, MD   100 mg at 06/03/23 7846   nicotine (NICODERM CQ - dosed in mg/24 hours) patch 21 mg  21 mg Transdermal Daily Lovie Macadamia, MD   21 mg at 06/03/23 9629   pantoprazole (PROTONIX) EC tablet 40 mg  40 mg Oral Daily Lovie Macadamia, MD   40 mg at 06/03/23 5284   pregabalin (LYRICA) capsule 150 mg  150 mg Oral BID Lovie Macadamia, MD   150 mg at 06/03/23 1324   promethazine (PHENERGAN) tablet 25 mg  25 mg Oral Q6H PRN Rocky Morel, DO   25 mg at 06/03/23 1616   Upadacitinib ER TB24 15 mg  15 mg Oral Daily Lovie Macadamia, MD       zolpidem (AMBIEN) tablet 10 mg  10 mg Oral QHS Lovie Macadamia, MD   10 mg at 06/02/23 2057    Allergies as of 06/02/2023 - Review Complete 06/02/2023  Allergen Reaction Noted   Other Other (See Comments) 02/24/2023   Codeine Nausea Only 01/21/2012    Family History  Problem Relation Age of Onset    Arthritis Other    Heart disease Other    Cancer Other    Hypertension Other     Social History   Socioeconomic History   Marital status: Married    Spouse name: Not on file   Number of children: Not on file   Years of education: Not on file   Highest education level: Not on file  Occupational History   Not on file  Tobacco Use   Smoking status: Every Day    Current packs/day: 0.00    Average packs/day: 1 pack/day for 40.0 years (40.0 ttl pk-yrs)    Types: Cigarettes    Start date: 04/25/1979    Last attempt to quit: 04/25/2019    Years since quitting: 4.1   Smokeless tobacco: Never  Vaping Use   Vaping status: Never Used  Substance and Sexual Activity   Alcohol use: Yes    Comment: daily-6-7/day   Drug use: Not Currently    Types: Marijuana   Sexual activity: Not on file  Other Topics Concern   Not on file  Social History Narrative   Not on file   Social Determinants of Health   Financial Resource Strain: Medium Risk (06/23/2018)   Overall Financial Resource Strain (CARDIA)    Difficulty of Paying Living Expenses: Somewhat hard  Food Insecurity: No Food Insecurity (06/02/2023)   Hunger Vital Sign    Worried About Running Out of Food in the Last Year: Never true    Ran Out of Food in the Last Year: Never true  Transportation Needs: Unmet Transportation Needs (06/02/2023)   PRAPARE - Transportation    Lack of Transportation (Medical): Yes    Lack of Transportation (Non-Medical): Yes  Physical Activity: Insufficiently Active (06/23/2018)   Exercise Vital Sign    Days of Exercise per Week: 4 days    Minutes of Exercise per Session: 30 min  Stress: Unknown (06/23/2018)   [employer] of Occupational Health - Occupational Stress Questionnaire    Feeling of Stress : Patient declined  Social Connections: Unknown (01/18/2022)   Received from [facility]   Social Network  Social Network: Not on file  Intimate Partner Violence: Not At Risk  (06/02/2023)   Humiliation, Afraid, Rape, and Kick questionnaire    Fear of Current or Ex-Partner: No    Emotionally Abused: No    Physically Abused: No    Sexually Abused: No    Review of Systems: Pertinent positive and negative review of systems were noted in the above HPI section.  All other review of systems was otherwise negative.   Physical Exam: Vital signs in last 24 hours: Temp:  [97.4 F (36.3 C)-98.1 F (36.7 C)] 97.4 F (36.3 C) (09/18 0733) Pulse Rate:  [46-56] 55 (09/18 0733) BP: (119-212)/(69-93) 212/93 (09/18 1600) SpO2:  [96 %-100 %] 96 % (09/18 0733) Weight:  [80.2 kg] 80.2 kg (09/18 0400) Last BM Date : 06/02/23 General:   Alert,  Well-developed, well-nourished,older WM pleasant and cooperative in NAD Head:  Normocephalic and atraumatic. Eyes:  Sclera clear, no icterus.   Conjunctiva pink. Ears:  Normal auditory acuity. Nose:  No deformity, discharge,  or lesions. Mouth:  No deformity or lesions.   Neck:  Supple; no masses or thyromegaly. Lungs:  Clear throughout to auscultation.   No wheezes, crackles, or rhonchi.  Heart:  Regular rate and rhythm; no murmurs, clicks, rubs,  or gallops. Abdomen:  Soft, BS active, there is tenderness in the epigastrium/hypogastrium, no rebound nonpalp mass or hsm.   Rectal: Not done Msk:  Symmetrical without gross deformities. . Pulses:  Normal pulses noted. Extremities:  Without clubbing or edema. Neurologic:  Alert and  oriented x4;  grossly normal neurologically. Skin:  Intact without significant lesions or rashes.. Psych:  Alert and cooperative. Normal mood and affect.  Intake/Output from previous day: No intake/output data recorded. Intake/Output this shift: No intake/output data recorded.  Lab Results: Recent Labs    06/02/23 1506 06/03/23 1010  WBC 4.4 5.0  HGB 8.5* 9.6*  HCT 26.3* 29.1*  PLT 491* 482*   BMET Recent Labs    06/02/23 1506 06/03/23 1010  NA 128* 133*  K 4.1 3.6  CL 91* 100  CO2 25  24  GLUCOSE 101* 115*  BUN 6* 6*  CREATININE 1.17 1.19  CALCIUM 8.8* 8.8*   LFT Recent Labs    06/03/23 1010  PROT 6.9  ALBUMIN 3.3*  AST 15  ALT 12  ALKPHOS 91  BILITOT 0.6   PT/INR No results for input(s): "LABPROT", "INR" in the last 72 hours. Hepatitis Panel No results for input(s): "HEPBSAG", "HCVAB", "HEPAIGM", "HEPBIGM" in the last 72 hours.   IMPRESSION:  #13 67 year old white male with 8-month history of upper abdominal discomfort, intermittent nausea and vomiting which has progressed over the past 2 to 3 weeks to occurring on a daily basis. H. pylori positive earlier this summer and treated has not had eradication confirmed  Etiology of the nausea and vomiting is not clear, he has also developed an iron deficiency anemia which has not been worked up.  Will need to rule out chronic ulcer disease, occult gastric lesion/neoplasm, chronic gastropathy, consider colonic neoplasm  #2 iron deficiency anemia as above Documented heme + June 2024  #3 ADD/bipolar disease/PTSD/anxiety/depression #4 previous chronic EtOH use stopped 6 months ago  Plan; continue Phenergan every 6 hours as needed PPI twice daily Continue to trend hemoglobin and transfuse as indicated for hemoglobin 7 or less Discussed EGD and colonoscopy with the patient, I am not certain that he will be able to tolerate a bowel prep just yet but he wants to  try.  Will plan to schedule him for EGD and colonoscopy with Dr. Tomasa Rand tomorrow again both procedures discussed in detail including indications risks and benefits and he is agreeable to proceed. If he cannot tolerate bowel prep then we can proceed with EGD tomorrow. GI will follow with you   Elianis Fischbach PA-C 06/03/2023, 4:47 PM

## 2023-06-04 ENCOUNTER — Observation Stay (HOSPITAL_COMMUNITY): Payer: Medicare HMO | Admitting: Anesthesiology

## 2023-06-04 ENCOUNTER — Other Ambulatory Visit (HOSPITAL_COMMUNITY): Payer: Self-pay

## 2023-06-04 ENCOUNTER — Other Ambulatory Visit: Payer: Self-pay | Admitting: Student

## 2023-06-04 ENCOUNTER — Encounter (HOSPITAL_COMMUNITY)
Admission: AD | Disposition: A | Discharge status: Home / Self Care | Payer: Self-pay | Source: Ambulatory Visit | Attending: Internal Medicine

## 2023-06-04 ENCOUNTER — Encounter (HOSPITAL_COMMUNITY): Payer: Self-pay | Admitting: Internal Medicine

## 2023-06-04 DIAGNOSIS — R195 Other fecal abnormalities: Secondary | ICD-10-CM | POA: Diagnosis not present

## 2023-06-04 DIAGNOSIS — K3189 Other diseases of stomach and duodenum: Secondary | ICD-10-CM | POA: Diagnosis not present

## 2023-06-04 DIAGNOSIS — A048 Other specified bacterial intestinal infections: Secondary | ICD-10-CM | POA: Diagnosis not present

## 2023-06-04 DIAGNOSIS — G40909 Epilepsy, unspecified, not intractable, without status epilepticus: Secondary | ICD-10-CM | POA: Diagnosis not present

## 2023-06-04 DIAGNOSIS — I10 Essential (primary) hypertension: Secondary | ICD-10-CM

## 2023-06-04 DIAGNOSIS — K5730 Diverticulosis of large intestine without perforation or abscess without bleeding: Secondary | ICD-10-CM | POA: Diagnosis not present

## 2023-06-04 DIAGNOSIS — F17210 Nicotine dependence, cigarettes, uncomplicated: Secondary | ICD-10-CM

## 2023-06-04 DIAGNOSIS — R112 Nausea with vomiting, unspecified: Secondary | ICD-10-CM | POA: Diagnosis not present

## 2023-06-04 DIAGNOSIS — K2950 Unspecified chronic gastritis without bleeding: Secondary | ICD-10-CM | POA: Diagnosis not present

## 2023-06-04 DIAGNOSIS — R634 Abnormal weight loss: Secondary | ICD-10-CM

## 2023-06-04 DIAGNOSIS — D509 Iron deficiency anemia, unspecified: Secondary | ICD-10-CM | POA: Diagnosis not present

## 2023-06-04 HISTORY — PX: COLONOSCOPY WITH PROPOFOL: SHX5780

## 2023-06-04 HISTORY — PX: BIOPSY: SHX5522

## 2023-06-04 HISTORY — PX: ESOPHAGOGASTRODUODENOSCOPY (EGD) WITH PROPOFOL: SHX5813

## 2023-06-04 LAB — BASIC METABOLIC PANEL
Anion gap: 8 (ref 5–15)
BUN: 5 mg/dL — ABNORMAL LOW (ref 8–23)
CO2: 24 mmol/L (ref 22–32)
Calcium: 8.5 mg/dL — ABNORMAL LOW (ref 8.9–10.3)
Chloride: 99 mmol/L (ref 98–111)
Creatinine, Ser: 1.11 mg/dL (ref 0.61–1.24)
GFR, Estimated: >60 mL/min (ref >60)
Glucose, Bld: 94 mg/dL (ref 70–99)
Potassium: 3.7 mmol/L (ref 3.5–5.1)
Sodium: 131 mmol/L — ABNORMAL LOW (ref 135–145)

## 2023-06-04 LAB — GLUCOSE, CAPILLARY: Glucose-Capillary: 88 mg/dL (ref 70–99)

## 2023-06-04 SURGERY — ESOPHAGOGASTRODUODENOSCOPY (EGD) WITH PROPOFOL
Anesthesia: Monitor Anesthesia Care

## 2023-06-04 MED ORDER — PROMETHAZINE HCL 25 MG PO TABS
25.0000 mg | ORAL_TABLET | Freq: Four times a day (QID) | ORAL | 0 refills | Status: DC | PRN
  Filled 2023-06-04 (×2): qty 90, 23d supply, fill #0

## 2023-06-04 MED ORDER — TETRACYCLINE HCL 500 MG PO CAPS
500.0000 mg | ORAL_CAPSULE | Freq: Four times a day (QID) | ORAL | 0 refills | Status: DC
  Filled 2023-06-04: qty 56, 14d supply, fill #0

## 2023-06-04 MED ORDER — METRONIDAZOLE 500 MG PO TABS
500.0000 mg | ORAL_TABLET | Freq: Four times a day (QID) | ORAL | 0 refills | Status: DC
  Filled 2023-06-04: qty 56, 14d supply, fill #0

## 2023-06-04 MED ORDER — BIS SUBCIT-METRONID-TETRACYC 140-125-125 MG PO CAPS
3.0000 | ORAL_CAPSULE | Freq: Three times a day (TID) | ORAL | 0 refills | Status: DC
  Filled 2023-06-04: qty 120, 10d supply, fill #0

## 2023-06-04 MED ORDER — BISMUTH SUBSALICYLATE 262 MG PO CHEW
525.0000 mg | CHEWABLE_TABLET | Freq: Four times a day (QID) | ORAL | 0 refills | Status: DC
Start: 2023-06-05 — End: 2023-06-04
  Filled 2023-06-04: qty 112, 14d supply, fill #0

## 2023-06-04 MED ORDER — PANTOPRAZOLE SODIUM 40 MG PO TBEC
40.0000 mg | DELAYED_RELEASE_TABLET | Freq: Two times a day (BID) | ORAL | 0 refills | Status: DC
  Filled 2023-06-04 (×4): qty 60, 30d supply, fill #0

## 2023-06-04 MED ORDER — PHENYLEPHRINE HCL (PRESSORS) 10 MG/ML IV SOLN
INTRAVENOUS | Status: DC | PRN
  Administered 2023-06-04 (×2): 80 ug via INTRAVENOUS

## 2023-06-04 MED ORDER — AMOXICILLIN 250 MG PO CAPS
750.0000 mg | ORAL_CAPSULE | Freq: Three times a day (TID) | ORAL | 0 refills | Status: DC
Start: 2023-06-05 — End: 2023-06-04
  Filled 2023-06-04: qty 126, 14d supply, fill #0

## 2023-06-04 MED ORDER — LIDOCAINE 2% (20 MG/ML) 5 ML SYRINGE
INTRAMUSCULAR | Status: DC | PRN
  Administered 2023-06-04: 70 mg via INTRAVENOUS

## 2023-06-04 MED ORDER — PROPOFOL 10 MG/ML IV BOLUS
INTRAVENOUS | Status: DC | PRN
  Administered 2023-06-04 (×3): 40 mg via INTRAVENOUS
  Administered 2023-06-04: 20 mg via INTRAVENOUS
  Administered 2023-06-04: 40 mg via INTRAVENOUS
  Administered 2023-06-04: 20 mg via INTRAVENOUS
  Administered 2023-06-04: 80 mg via INTRAVENOUS
  Administered 2023-06-04 (×5): 20 mg via INTRAVENOUS
  Administered 2023-06-04: 40 mg via INTRAVENOUS
  Administered 2023-06-04 (×2): 20 mg via INTRAVENOUS

## 2023-06-04 MED ORDER — LACTATED RINGERS IV SOLN: INTRAVENOUS | Status: DC

## 2023-06-04 MED ORDER — LEVOFLOXACIN 500 MG PO TABS
500.0000 mg | ORAL_TABLET | Freq: Every day | ORAL | 0 refills | Status: DC
Start: 2023-06-05 — End: 2023-06-04
  Filled 2023-06-04: qty 14, 14d supply, fill #0

## 2023-06-04 MED ORDER — AMLODIPINE BESYLATE 5 MG PO TABS
5.0000 mg | ORAL_TABLET | Freq: Every day | ORAL | 11 refills | Status: DC
  Filled 2023-06-04 (×2): qty 30, 30d supply, fill #0

## 2023-06-04 SURGICAL SUPPLY — 25 items
BLOCK BITE 60FR ADLT L/F BLUE (MISCELLANEOUS) ×2 IMPLANT
ELECT REM PT RETURN 9FT ADLT (ELECTROSURGICAL) IMPLANT
ELECTRODE REM PT RTRN 9FT ADLT (ELECTROSURGICAL) IMPLANT
FCP BXJMBJMB 240X2.8X (CUTTING FORCEPS)
FLOOR PAD 36X40 (MISCELLANEOUS) ×2 IMPLANT
FORCEP RJ3 GP 1.8X160 W-NEEDLE (CUTTING FORCEPS) IMPLANT
FORCEPS BIOP RAD 4 LRG CAP 4 (CUTTING FORCEPS) IMPLANT
FORCEPS BIOP RJ4 240 W/NDL (CUTTING FORCEPS) IMPLANT
FORCEPS BXJMBJMB 240X2.8X (CUTTING FORCEPS) IMPLANT
INJECTOR/SNARE I SNARE (MISCELLANEOUS) IMPLANT
LUBRICANT JELLY 4.5OZ STERILE (MISCELLANEOUS) IMPLANT
MANIFOLD NEPTUNE II (INSTRUMENTS) IMPLANT
NDL SCLEROTHERAPY 25GX240 (NEEDLE) IMPLANT
NEEDLE SCLEROTHERAPY 25GX240 (NEEDLE) IMPLANT
PAD FLOOR 36X40 (MISCELLANEOUS) ×2 IMPLANT
PROBE APC STR FIRE (PROBE) IMPLANT
PROBE INJECTION GOLD (MISCELLANEOUS)
PROBE INJECTION GOLD 7FR (MISCELLANEOUS) IMPLANT
SNARE ROTATE MED OVAL 20MM (MISCELLANEOUS) IMPLANT
SNARE SHORT THROW 13M SML OVAL (MISCELLANEOUS) IMPLANT
SYR 50ML LL SCALE MARK (SYRINGE) IMPLANT
TRAP SPECIMEN MUCOUS 40CC (MISCELLANEOUS) IMPLANT
TUBING ENDO SMARTCAP PENTAX (MISCELLANEOUS) ×4 IMPLANT
TUBING IRRIGATION ENDOGATOR (MISCELLANEOUS) ×2 IMPLANT
WATER STERILE IRR 1000ML POUR (IV SOLUTION) IMPLANT

## 2023-06-04 NOTE — Op Note (Signed)
[facility] Patient Name: David [name] Procedure Date : 06/04/2023 MRN: [id number] Attending MD: Dub Amis. Tomasa Rand , MD, [id number] Date of Birth: October 10, 1955 CSN: [id number] Age: 67 Admit Type: Inpatient Procedure:                Upper GI endoscopy Indications:              Iron deficiency anemia, Nausea with vomiting,                            Weight loss Providers:                Lorin Picket E. Tomasa Rand, MD, Vicki Mallet, RN, Salley Scarlet, Technician, Sorin Munteanu, CRNA Referring MD:             Mercie Eon Medicines:                Monitored Anesthesia Care Complications:            No immediate complications. Estimated Blood Loss:     Estimated blood loss was minimal. Procedure:                Pre-Anesthesia Assessment:                           - Prior to the procedure, a History and Physical                            was performed, and patient medications and                            allergies were reviewed. The patient's tolerance of                            previous anesthesia was also reviewed. The risks                            and benefits of the procedure and the sedation                            options and risks were discussed with the patient.                            All questions were answered, and informed consent                            was obtained. Prior Anticoagulants: The patient has                            taken no anticoagulant or antiplatelet agents. ASA                            Grade Assessment: III - A patient with severe  systemic disease. After reviewing the risks and                            benefits, the patient was deemed in satisfactory                            condition to undergo the procedure.                           After obtaining informed consent, the endoscope was                            passed under direct vision. Throughout the                             procedure, the patient's blood pressure, pulse, and                            oxygen saturations were monitored continuously. The                            [id number]-[id number] ([id number] Olympus endoscope was introduced                            through the mouth, and advanced to the second part                            of duodenum. The upper GI endoscopy was                            accomplished without difficulty. The patient                            tolerated the procedure well. Scope In: Scope Out: Findings:      The examined esophagus was normal.      Diffuse atrophic mucosa was found at the incisura and in the gastric       antrum. Biopsies were taken with a cold forceps for Helicobacter pylori       testing. Estimated blood loss was minimal.      The exam of the stomach was otherwise normal.      The examined duodenum was normal. Biopsies for histology were taken with       a cold forceps for evaluation of celiac disease. Estimated blood loss       was minimal. Impression:               - Normal esophagus.                           - Gastric mucosal atrophy. Biopsied. Suspect                            persistent H. pylori infection                           - Normal examined duodenum. Biopsied.                           -  No ulcers, mass lesions, strictures/stenoses or                            significant inflammatory changes seen. Moderate Sedation:      N/A Recommendation:           - Return patient to hospital ward for ongoing care.                           - Resume previous diet.                           - Continue present medications.                           - Await pathology results. Procedure Code(s):        --- Professional ---                           [phone], Esophagogastroduodenoscopy, flexible,                            transoral; with biopsy, single or multiple Diagnosis Code(s):        --- Professional ---                           K31.89, Other  diseases of stomach and duodenum                           D50.9, Iron deficiency anemia, unspecified                           R11.2, Nausea with vomiting, unspecified                           R63.4, Abnormal weight loss CPT copyright 2022 American Medical Association. All rights reserved. The codes documented in this report are preliminary and upon coder review may  be revised to meet current compliance requirements. Stacy Sailer E. Tomasa Rand, MD 06/04/2023 10:46:21 AM This report has been signed electronically. Number of Addenda: 0

## 2023-06-04 NOTE — Discharge Summary (Addendum)
Name: David [name] MRN: [id number] DOB: 04-13-1956 67 y.o. PCP: David Lime, MD  Date of Admission: 06/02/2023  4:28 PM Date of Discharge:  06/04/2023 Attending Physician: Dr.  Lafonda [name]  DISCHARGE DIAGNOSIS:  Primary Problem: Nausea & vomiting   Hospital Problems: Principal Problem:   Nausea & vomiting Active Problems:   Iron deficiency anemia   Loss of weight   Fecal occult blood test positive   H. pylori infection    DISCHARGE MEDICATIONS:   Allergies as of 06/04/2023       Reactions   Other Other (See Comments)   Has eczema, so no strong detergents or dryer sheets   Codeine Nausea Only        Medication List     STOP taking these medications    doxycycline 100 MG capsule Commonly known as: VIBRAMYCIN   ibuprofen 200 MG tablet Commonly known as: ADVIL   ondansetron 4 MG disintegrating tablet Commonly known as: ZOFRAN-ODT       TAKE these medications    amLODipine 5 MG tablet Commonly known as: NORVASC Take 1 tablet (5 mg total) by mouth daily.   aspirin EC 81 MG tablet Take 1 tablet (81 mg total) by mouth daily. Swallow whole.   bismuth-metronidazole-tetracycline 140-125-125 MG capsule Commonly known as: PYLERA Take 3 capsules by mouth 4 (four) times daily -  before meals and at bedtime for 14 days. Start taking on: June 06, 2023   cloNIDine 0.2 MG tablet Commonly known as: CATAPRES TAKE 1 TABLET(0.2 MG) BY MOUTH TWICE DAILY What changed: See the new instructions.   fluorouracil 5 % cream Commonly known as: EFUDEX Apply 1 Application topically 2 (two) times daily.   FLUoxetine 20 MG capsule Commonly known as: PROZAC TAKE 2 CAPSULES(40 MG) BY MOUTH DAILY What changed: See the new instructions.   hydrOXYzine 25 MG tablet Commonly known as: ATARAX Take 25 mg by mouth every 6 (six) hours as needed for anxiety or itching.   lamoTRIgine 100 MG tablet Commonly known as: LAMICTAL Take 1 tablet (100 mg total) by mouth 2 (two)  times daily. Appointment needed for further refills   modafinil 200 MG tablet Commonly known as: PROVIGIL TAKE 1 TABLET BY MOUTH DAILY   nicotine 21 mg/24hr patch Commonly known as: Nicoderm CQ Place 1 patch (21 mg total) onto the skin daily.   pantoprazole 40 MG tablet Commonly known as: Protonix Take 1 tablet (40 mg total) by mouth 2 (two) times daily. What changed: when to take this   pregabalin 150 MG capsule Commonly known as: LYRICA TAKE 1 CAPSULE(150 MG) BY MOUTH TWICE DAILY What changed: See the new instructions.   promethazine 25 MG tablet Commonly known as: PHENERGAN Take 1 tablet (25 mg total) by mouth every 6 (six) hours as needed for nausea or vomiting. What changed: how much to take   propranolol 10 MG tablet Commonly known as: INDERAL TAKE 1 TO 4 TABLETS BY MOUTH TWICE DAILY AS NEEDED FOR TREMORS   QUEtiapine 200 MG tablet Commonly known as: SEROQUEL Take 2 tablets (400 mg total) by mouth at bedtime.   Rinvoq 15 MG Tb24 Generic drug: Upadacitinib ER Take 15 mg by mouth daily.   zolpidem 10 MG tablet Commonly known as: AMBIEN TAKE 1 TO 1 AND 1/2 TABLETS BY MOUTH AT NIGHT AS NEEDED FOR SLEEP What changed: See the new instructions.        DISPOSITION AND FOLLOW-UP:  DavidDavid [name] was discharged from [facility]  Hospital in Stable condition. At the hospital follow up visit please address:  Follow-up Recommendations: Consults: None Labs:  Ferritin and Iron Panel and CBC Studies: Test of cure h.pylori  Medications: Flu and pneumonia vaccine. Titrate BP meds as you see fit  Follow-up Appointments: 06/18/2023 8:45 AM David Pinch, DO [facility]     HOSPITAL COURSE:  Patient Summary:  [name] is a 67 year old gentleman with a past medical history significant for seizure disorder, hypertension, tobacco use disorder, history of alcohol abuse, and mood disorders including GAD, MDD, episodic mood disorder.  The  patient was directly admitted to our service from the internal medicine teaching service by Dr. Carlynn Purl MD and Dr. Heide Spark MD in the setting of inability to tolerate p.o. and repetitive nausea and vomiting for the past 1 to 2 weeks.  During his stay here, he was treated w/ PO antiemetics (phenergan) and soft / liquid diet. He underwent EGD and colonoscopy in the setting of vomiting / iron deficiency anemia. Which revealed atrophic gastritis c/f h.pylori.  He had diverticulosis and was recommended for repeat colonoscopy in three years. He feels well following this procedure.  His stay was complicated by HTN for which we have added amlodipine 5mg  for.   #Nausea  #Vomiting  - Phenergan as needed for nausea - Continue full soft/ liquid diet.   #Seizure disorder Given patient has a history of seizure disorder with recent hospitalizations due to altered mental status and setting of missed medication doses as well as inability to tolerate p.o. scratch that - Lamictal trough level ordered and pending   #Microcytic anemia - Outpatient workup - no signs of colon cancer on scope while here  #Health maintenance - Would like flu and pneumonia vaccines   DISCHARGE INSTRUCTIONS:   Discharge Instructions     Call MD for:  extreme fatigue   Complete by: As directed    Call MD for:  persistant dizziness or light-headedness   Complete by: As directed    Call MD for:  persistant nausea and vomiting   Complete by: As directed    Call MD for:  redness, tenderness, or signs of infection (pain, swelling, redness, odor or green/yellow discharge around incision site)   Complete by: As directed    Call MD for:  severe uncontrolled pain   Complete by: As directed    Call MD for:  temperature >100.4   Complete by: As directed    Diet full liquid   Complete by: As directed    Increase activity slowly   Complete by: As directed        SUBJECTIVE:  Patient seen today after his procedures. He is feeling  much better. We discussed the results of his EGD and colonoscopy and have spoken with him about trying a new antibiotic therapy for his likely h.pylori infection. He would like to discharge today and is feeling well. We also discussed his BP.   Addendum: Patient was unable to stay for updated discharge instructions and to pick up his medications.  Insurance would not cover tetracycline so instead we used bismuth metronidazole tetracycline combination pill.  I have updated the discharge instructions to reflect this and pharmacy will reach out to the patient in order to inform him of this change.  He will pick up his medications from the [facility] outpatient pharmacy tomorrow and begin his antibiotic regiment the following day.  Discharge Vitals:   BP (!) 162/106 (BP Location: Right Arm)  Pulse 74   Temp 98 F (36.7 C) (Tympanic)   Resp 18   Ht 5\' 8"  (1.727 m)   Wt 81.6 kg   SpO2 94%   BMI 27.37 kg/m   OBJECTIVE:  Physical Exam Constitutional:      Appearance: Normal appearance.  Cardiovascular:     Rate and Rhythm: Normal rate and regular rhythm.  Pulmonary:     Effort: Pulmonary effort is normal.     Breath sounds: Normal breath sounds.  Abdominal:     General: Bowel sounds are normal.     Comments: Diffuse tenderness, worse in LUQ and epigastrum  Skin:    General: Skin is warm and dry.  Neurological:     Mental Status: He is alert. Mental status is at baseline.      Pertinent Labs, Studies, and Procedures:     Latest Ref Rng & Units 06/03/2023   10:10 AM 06/02/2023    3:06 PM 05/29/2023    8:46 PM  CBC  WBC 4.0 - 10.5 K/uL 5.0  4.4  9.8   Hemoglobin 13.0 - 17.0 g/dL 9.6  8.5  9.0   Hematocrit 39.0 - 52.0 % 29.1  26.3  27.1   Platelets 150 - 400 K/uL 482  491  531        Latest Ref Rng & Units 06/04/2023    4:47 AM 06/03/2023   10:10 AM 06/02/2023    3:06 PM  CMP  Glucose 70 - 99 mg/dL 94  161  096   BUN 8 - 23 mg/dL 5  6  6    Creatinine 0.61 - 1.24 mg/dL 0.45   4.09  8.11   Sodium 135 - 145 mmol/L 131  133  128   Potassium 3.5 - 5.1 mmol/L 3.7  3.6  4.1   Chloride 98 - 111 mmol/L 99  100  91   CO2 22 - 32 mmol/L 24  24  25    Calcium 8.9 - 10.3 mg/dL 8.5  8.8  8.8   Total Protein 6.5 - 8.1 g/dL  6.9  6.8   Total Bilirubin 0.3 - 1.2 mg/dL  0.6  0.5   Alkaline Phos 38 - 126 U/L  91  94   AST 15 - 41 U/L  15  14   ALT 0 - 44 U/L  12  14     No results found.   Signed: Lovie Macadamia MD Internal Medicine Resident, PGY-1 [facility]  Pager: [phone] 4:20 PM, 06/04/2023

## 2023-06-04 NOTE — Anesthesia Preprocedure Evaluation (Signed)
Anesthesia Evaluation  Patient identified by MRN, date of birth, ID band Patient awake    Reviewed: Allergy & Precautions, H&P , NPO status , Patient's Chart, lab work & pertinent test results  Airway Mallampati: II  TM Distance: >3 FB Neck ROM: Full    Dental no notable dental hx.    Pulmonary Current Smoker   Pulmonary exam normal breath sounds clear to auscultation       Cardiovascular hypertension, Pt. on medications Normal cardiovascular exam Rhythm:Regular Rate:Normal     Neuro/Psych negative neurological ROS  negative psych ROS   GI/Hepatic negative GI ROS,,,(+)     substance abuse  alcohol use  Endo/Other  negative endocrine ROS    Renal/GU negative Renal ROS  negative genitourinary   Musculoskeletal negative musculoskeletal ROS (+)    Abdominal   Peds negative pediatric ROS (+)  Hematology  (+) Blood dyscrasia, anemia   Anesthesia Other Findings   Reproductive/Obstetrics negative OB ROS                             Anesthesia Physical Anesthesia Plan  ASA: 3  Anesthesia Plan: MAC   Post-op Pain Management:    Induction: Intravenous  PONV Risk Score and Plan: 1 and Treatment may vary due to age or medical condition  Airway Management Planned: Simple Face Mask  Additional Equipment:   Intra-op Plan:   Post-operative Plan:   Informed Consent: I have reviewed the patients History and Physical, chart, labs and discussed the procedure including the risks, benefits and alternatives for the proposed anesthesia with the patient or authorized representative who has indicated his/her understanding and acceptance.     Dental advisory given  Plan Discussed with: CRNA and Surgeon  Anesthesia Plan Comments:        Anesthesia Quick Evaluation

## 2023-06-04 NOTE — Progress Notes (Signed)
Pt to be discharge home to self care.  Reviewed AVS with pt, made him aware of his f/u appointment, changes to his medication list and that he had current prescriptions to be picked up from the Outpatient Pharmacy ([facility]).  When pt was discharge there were pending questions about his PO abx concerning if his insurance would cover the cost.  Made pt aware of finding but he was anxious to go home and stated., "I will be back tomorrow to pick my medications."  Informed Youssefzadeh, MD about pt leaving prior to receiving his medications.  Answered any pending pt had prior to him leaving.  Removed PIV which was CDI and free from s/sx of infection.  Pt walked off the unit to the front of the hospital where his ride awaited to take him home.  Pt discharge in stable condition.

## 2023-06-04 NOTE — Discharge Instructions (Addendum)
You were hospitalized for vomiting and nausea . Thank you for allowing [place] to be part of your care.   We arranged for you to follow up at:  06/18/2023 8:45 AM Jeral Pinch, DO [facility] (downstairs)     Please note these changes made to your medications:   Please START taking:  A. Phenergan - promethazine 25 MG tablet every 6 hours as needed for nausea  B. Amlodipine 5mg  Daily for blood pressure  For you H.Pylori - stomach infection  FOUR TIME A DAY: A. bismuth-metronidazole-tetracycline 140-125-125 MG capsule - Take THREE Capsules by mouth FOUR times a day (total of 12 per day)  TWO TIMES A DAY: D. pantoprazole 40 MG tablet  Please STOP taking:  doxycycline 100 MG capsule ibuprofen 200 MG tablet  Please make sure to follow up with [place] in clinic so we can get you your vaccines and work on your blood pressure and anemia.   Please call our clinic if you have any questions or concerns, we may be able to help and keep you from a long and expensive emergency room wait. Our clinic and after hours phone number is [phone], the best time to call is Monday through Friday 9 am to 4 pm but there is always someone available 24/7 if you have an emergency. If you need medication refills please notify your pharmacy one week in advance and they will send [place] a request.

## 2023-06-04 NOTE — Plan of Care (Signed)
Goal: Ability to manage health-related needs will improve Outcome: Progressing  Pt understands he was admitted into the hospital for lower abdominal pain, n/v and decrease PO intake for 2 weeks.  Today he had an EGD and colonoscopy to r/o bleeding and H. Pylori.   Problem: Clinical Measurements: Goal: Will remain free from infection Outcome: Progressing S/Sx of infection monitored and assessed q-shift.  Pt has remained afebrile thus far.     Problem: Clinical Measurements: Goal: Respiratory complications will improve Outcome: Progressing Respiratory status monitored and assessed q-shift.  Pt is on room air with PO2 at 93-99% and respiration rate of 11-18 breaths per minute.  Pt has not endorsed c/o SOB or DOE.    Problem: Activity: Goal: Risk for activity intolerance will decrease Outcome: Progressing Pt is independent of all his ADLs.  He was observed OOB ambulating in his room with a steady gait.   Problem: Nutrition: Goal: Adequate nutrition will be maintained Outcome: Progressing Pt was NPO per MD's orders for EDG and colonoscopy.  He is now back from his procedure.  MD ordered for pt to resume soft diet. He has called for his lunch tray.  Informed MD that I will notify them if pt continues to have n/v.   Problem: Coping: Goal: Level of anxiety will decrease Outcome: Progressing Pt has not endorsed c/o anxiety thus far.       Problem: Clinical Measurements: Goal: Cardiovascular complication will be avoided Outcome: Progressing Pt hypertensive this shift after EDG and colonoscopy.  Lafonda Mosses, MD notified.  He is on schedule antihypertensives.  Will give medications an hour to take effect before rechecking manually.    Problem: Elimination: Goal: Will not experience complications related to bowel motility Outcome: Progressing Pt is continent of both bowel and bladder.  He has not endorsed c/o constipation, dysuria or abdominal pain/ distention.          Problem: Pain  Managment: Goal: General experience of comfort will improve Outcome: Progressing Pt has denied pain thus far.   Problem: Safety: Goal: Ability to remain free from injury will improve Outcome: Progressing Pt has remained free from falls thus far.  Instructed pt to utilize RN call light for assistance.  Hourly rounds performed.  Bed locked, in lowest position, with two upper side rails engaged.  Belongings and call light within reach.  Pt was observed OOB ambulating in his room with a steady gait.  Nonskid sock implemented.   Problem: Skin Integrity: Goal: Risk for impaired skin integrity will decrease Outcome: Progressing Skin integrity monitored and assessed q-shift.  Instructed pt to turn q2 hours to prevent further skin impairment.  Tubes and drains assessed for device related pressure sores.  Pt is continent of both bowel and bladder.  Dressing changes performed per MD's orders.

## 2023-06-04 NOTE — Interval H&P Note (Signed)
History and Physical Interval Note:  06/04/2023 9:56 AM  David [name]  has presented today for surgery, with the diagnosis of Nausea, vomiting, abdominal pain, iron deficiency anemia.  The various methods of treatment have been discussed with the patient and family. After consideration of risks, benefits and other options for treatment, the patient has consented to  Procedure(s): ESOPHAGOGASTRODUODENOSCOPY (EGD) WITH PROPOFOL (N/A) COLONOSCOPY WITH PROPOFOL (N/A) as a surgical intervention.  The patient's history has been reviewed, patient examined, no change in status, [name] for surgery.  I have reviewed the patient's chart and labs.  Questions were answered to the patient's satisfaction.     Jenel Lucks

## 2023-06-04 NOTE — Transfer of Care (Signed)
Immediate Anesthesia Transfer of Care Note  Patient: David [name]  Procedure(s) Performed: ESOPHAGOGASTRODUODENOSCOPY (EGD) WITH PROPOFOL COLONOSCOPY WITH PROPOFOL BIOPSY  Patient Location: Endoscopy Unit  Anesthesia Type:MAC  Level of Consciousness: sedated and drowsy  Airway & Oxygen Therapy: Patient Spontanous Breathing and Patient connected to nasal cannula oxygen  Post-op Assessment: Report given to RN and Post -op Vital signs reviewed and stable  Post vital signs: Reviewed and stable  Last Vitals:  Vitals Value Taken Time  BP 127/98   Temp 97.6   Pulse 81   Resp 14   SpO2 98 %     Last Pain:  Vitals:   06/04/23 1040  TempSrc: Tympanic  PainSc: Asleep         Complications: No notable events documented.

## 2023-06-04 NOTE — Anesthesia Postprocedure Evaluation (Signed)
Anesthesia Post Note  Patient: David [name]  Procedure([name]) Performed: ESOPHAGOGASTRODUODENOSCOPY (EGD) WITH PROPOFOL COLONOSCOPY WITH PROPOFOL BIOPSY     Patient location during evaluation: PACU Anesthesia Type: MAC Level of consciousness: awake and alert Pain management: pain level controlled Vital Signs Assessment: post-procedure vital signs reviewed and stable Respiratory status: spontaneous breathing, nonlabored ventilation, respiratory function stable and patient connected to nasal cannula oxygen Cardiovascular status: stable and blood pressure returned to baseline Postop Assessment: no apparent nausea or vomiting Anesthetic complications: no  No notable events documented.  Last Vitals:  Vitals:   06/04/23 1110 06/04/23 1137  BP: 128/86 (!) 211/121  Pulse: 82 79  Resp: 15 18  Temp:    SpO2: 94%     Last Pain:  Vitals:   06/04/23 1110  TempSrc:   PainSc: 0-No pain                 David [name]

## 2023-06-04 NOTE — Op Note (Signed)
[facility] Patient Name: David [name] Procedure Date : 06/04/2023 MRN: [id number] Attending MD: Dub Amis. Tomasa Rand , MD, [id number] Date of Birth: October 15, 1955 CSN: [id number] Age: 67 Admit Type: Inpatient Procedure:                Colonoscopy Indications:              Heme positive stool, Iron deficiency anemia, Weight                            loss Providers:                Lorin Picket E. Tomasa Rand, MD, Vicki Mallet, RN, Salley Scarlet, Technician, Sorin Munteanu, CRNA Referring MD:             Mercie Eon Medicines:                Monitored Anesthesia Care Complications:            No immediate complications. Estimated Blood Loss:     Estimated blood loss: none. Procedure:                Pre-Anesthesia Assessment:                           - Prior to the procedure, a History and Physical                            was performed, and patient medications and                            allergies were reviewed. The patient's tolerance of                            previous anesthesia was also reviewed. The risks                            and benefits of the procedure and the sedation                            options and risks were discussed with the patient.                            All questions were answered, and informed consent                            was obtained. Prior Anticoagulants: The patient has                            taken no anticoagulant or antiplatelet agents. ASA                            Grade Assessment: III - A patient with severe  systemic disease. After reviewing the risks and                            benefits, the patient was deemed in satisfactory                            condition to undergo the procedure.                           After obtaining informed consent, the colonoscope                            was passed under direct vision. Throughout the                             procedure, the patient's blood pressure, pulse, and                            oxygen saturations were monitored continuously. The                            CF-HQ190L ([id number] Olympus colonoscope was                            introduced through the anus and advanced to the the                            terminal ileum, with identification of the                            ileocecal valve. The colonoscopy was performed                            without difficulty. The patient tolerated the                            procedure well. The quality of the bowel                            preparation was inadequate. The terminal ileum, the                            ileocecal valve and the rectum were photographed.                            The bowel preparation used was MoviPrep via split                            dose instruction. Scope In: 10:14:11 AM Scope Out: 10:35:41 AM Scope Withdrawal Time: 0 hours 12 minutes 34 seconds  Total Procedure Duration: 0 hours 21 minutes 30 seconds  Findings:      The perianal and digital rectal examinations were normal. Pertinent       negatives include normal sphincter tone and no palpable rectal lesions.  A single small-mouthed diverticulum was found in the ascending colon.      The exam was otherwise normal throughout the examined colon.      The terminal ileum appeared normal.      The retroflexed view of the distal rectum and anal verge was normal and       showed no anal or rectal abnormalities. Impression:               - Preparation of the colon was inadequate, but was                            adequate to exclude colonic neoplasm as cause of                            patient's iron deficiency anemia/symptoms. Small                            vascular lesions such as AVMs could have easily                            been missed.                           - Mild diverticulosis in the ascending colon.                           - The  examined portion of the ileum was normal.                           - The distal rectum and anal verge are normal on                            retroflexion view.                           - No specimens collected. Moderate Sedation:      N/A Recommendation:           - Return patient to hospital ward for ongoing care.                           - Resume regular diet.                           - Continue present medications.                           - Repeat colonoscopy in 3 years because the bowel                            preparation was suboptimal.                           - If patient is able to tolerate PO, ok to                            discharge  home today. Symptoms/IDA may be secondary                            to persistent H. pylori infection. Underlying                            anxiety may also be contributing to his n/v.                           - GI will sign off at this time. Will arrange for                            outpatient follow up. Consider capsule endoscopy if                            no H. pylori infection on biopsies. Procedure Code(s):        --- Professional ---                           [phone], Colonoscopy, flexible; diagnostic, including                            collection of specimen(s) by brushing or washing,                            when performed (separate procedure) Diagnosis Code(s):        --- Professional ---                           R19.5, Other fecal abnormalities                           D50.9, Iron deficiency anemia, unspecified                           R63.4, Abnormal weight loss                           K57.30, Diverticulosis of large intestine without                            perforation or abscess without bleeding CPT copyright 2022 [employer]. All rights reserved. The codes documented in this report are preliminary and upon coder review may  be revised to meet current compliance requirements. Tammi Boulier E.  Tomasa Rand, MD 06/04/2023 10:57:01 AM This report has been signed electronically. Number of Addenda: 0

## 2023-06-04 NOTE — Care Management (Signed)
Transition of Care [facility]) - Inpatient Brief Assessment   Patient Details  Name: David [name] MRN: [id number] Date of Birth: 06-22-1956  Transition of Care [facility]) CM/SW Contact:    Lockie Pares, RN Phone Number: 06/04/2023, 2:23 PM   Clinical Narrative: Presented with abdominal pain EGD to R/O H pylori, done today. SDOH - may need transportation assistance.  Ambulating without difficulty.        Transition of Care Asessment: Insurance and Status: Insurance coverage has been reviewed Patient has primary care physician: Yes Home environment has been reviewed: Yes Prior level of function:: Independent Prior/Current Home Services: No current home services Social Determinants of Health Reivew: SDOH reviewed needs interventions Readmission risk has been reviewed: Yes Transition of care needs: no transition of care needs at this time

## 2023-06-05 ENCOUNTER — Other Ambulatory Visit (HOSPITAL_COMMUNITY): Payer: Self-pay

## 2023-06-05 ENCOUNTER — Telehealth: Payer: Self-pay

## 2023-06-05 LAB — LAMOTRIGINE LEVEL: Lamotrigine Lvl: 3.7 ug/mL (ref 2.0–20.0)

## 2023-06-05 NOTE — Transitions of Care (Post Inpatient/ED Visit) (Signed)
06/05/2023  Name: David [name] MRN: [id number] DOB: [birth year]  Today's TOC FU Call Status: Today's TOC FU Call Status:: Unsuccessful Call (1st Attempt) Unsuccessful Call (1st Attempt) Date: 06/05/23  Attempted to reach the patient regarding the most recent Inpatient/ED visit.  Follow Up Plan: Additional outreach attempts will be made to reach the patient to complete the Transitions of Care (Post Inpatient/ED visit) call.   Signature Karena Addison, LPN [facility] Nurse Health Advisor Direct Dial [phone]

## 2023-06-07 ENCOUNTER — Encounter (HOSPITAL_COMMUNITY): Payer: Self-pay | Admitting: Gastroenterology

## 2023-06-08 LAB — SURGICAL PATHOLOGY

## 2023-06-09 ENCOUNTER — Telehealth: Payer: Self-pay | Admitting: Gastroenterology

## 2023-06-09 NOTE — Progress Notes (Signed)
David [name],  The biopsies taken from your stomach were notable for mild chronic gastritis (inflammation) which is a common finding, but there was no evidence of Helicobacter pylori infection. This common finding is not likely to explain your symptoms or iron deficiency and there is no specific treatment or further evaluation recommended. The biopsies of your duodenum were normal.  There was no evidence celiac disease.  I recommend we repeat a CBC and iron panel in 1 month.  If your anemia/iron deficiency are not improving, I would recommend we proceed with a pill camera to evaluate your small intestines for a bleeding source.

## 2023-06-09 NOTE — Telephone Encounter (Signed)
Inbound call from patient wife returning phone call. Requesting a call back. Please advise, thank you.

## 2023-06-09 NOTE — Transitions of Care (Post Inpatient/ED Visit) (Signed)
06/09/2023  Name: David [name] MRN: [id number] DOB: 1956-04-27  Today's TOC FU Call Status: Today's TOC FU Call Status:: Unsuccessful Call (2nd Attempt) Unsuccessful Call (1st Attempt) Date: 06/05/23 Unsuccessful Call (2nd Attempt) Date: 06/09/23 Patient's Name and Date of Birth confirmed.  Transition Care Management Follow-up Telephone Call Date of Discharge: 06/09/23 Discharge Facility: [facility]) Type of Discharge: Inpatient Admission Primary Inpatient Discharge Diagnosis:: nausea and vomiting How have you been since you were released from the hospital?: Better Any questions or concerns?: No  Items Reviewed: Medications obtained,verified, and reconciled?: Yes (Medications Reviewed) Any new allergies since your discharge?: No Dietary orders reviewed?: Yes Do you have support at home?: Yes People in Home: spouse  Medications Reviewed Today: Medications Reviewed Today     Reviewed by Karena Addison, LPN (Licensed Practical Nurse) on 06/09/23 at 1537  Med List Status: <None>   Medication Order Taking? Sig Documenting Provider Last Dose Status Informant  amLODipine (NORVASC) 5 MG tablet [id number]  Take 1 tablet (5 mg total) by mouth daily. Lovie Macadamia, MD  Active   aspirin EC 81 MG tablet [id number] No Take 1 tablet (81 mg total) by mouth daily. Swallow whole. Maretta Bees, MD Past Week Active Self, Pharmacy Records  bismuth-metronidazole-tetracycline [facility]) [phone] MG capsule [id number]  Take 3 capsules by mouth 4 (four) times daily -  before meals and at bedtime for 14 days. Lovie Macadamia, MD  Active   cloNIDine (CATAPRES) 0.2 MG tablet [id number] No TAKE 1 TABLET(0.2 MG) BY MOUTH TWICE DAILY  Patient taking differently: Take 0.2 mg by mouth 2 (two) times daily.   Lauraine Rinne., MD 06/01/2023 Active Self, Pharmacy Records           Med Note (COFFELL, Marzella Schlein   Thu Mar 19, 2023  6:54 PM) Pt states he is supposed to take twice a day but often  forgets doses and will take a double dose if he missed morning dose.  fluorouracil (EFUDEX) 5 % cream [id number] No Apply 1 Application topically 2 (two) times daily. [provider] Past Month Active Self, Pharmacy Records  FLUoxetine (PROZAC) 20 MG capsule [id number] No TAKE 2 CAPSULES(40 MG) BY MOUTH DAILY  Patient taking differently: Take 40 mg by mouth daily.   Lauraine Rinne., MD 06/02/2023 Active Self, Pharmacy Records  hydrOXYzine (ATARAX) 25 MG tablet [id number] No Take 25 mg by mouth every 6 (six) hours as needed for anxiety or itching. [provider] Past Week Active Self, Pharmacy Records  lamoTRIgine (LAMICTAL) 100 MG tablet [id number] No Take 1 tablet (100 mg total) by mouth 2 (two) times daily. Appointment needed for further refills Levert Feinstein, MD unknown Active Self, Pharmacy Records           Med Note (COFFELL, Herb Grays Mar 19, 2023  6:54 PM) Pt states he is supposed to take twice a day but often forgets doses and will take a double dose if he missed morning dose.  modafinil (PROVIGIL) 200 MG tablet [id number] No TAKE 1 TABLET BY MOUTH DAILY Cottle, Steva Ready., MD 06/01/2023 Active Self, Pharmacy Records  nicotine (NICODERM CQ) 21 mg/24hr patch [id number]  Place 1 patch (21 mg total) onto the skin daily. Cottle, Steva Ready., MD  Active Self, Pharmacy Records           Med Note [place], AMY L   Tue Jun 02, 2023  4:46 PM) Patient hasn't picked up from Pharmacy due to illness.  pantoprazole (PROTONIX) 40 MG tablet [id number]  Take 1 tablet (40 mg total) by mouth 2 (two) times daily. Lovie Macadamia, MD  Active   pregabalin (LYRICA) 150 MG capsule [id number] No TAKE 1 CAPSULE(150 MG) BY MOUTH TWICE DAILY  Patient taking differently: Take 150 mg by mouth 2 (two) times daily.   Cottle, Steva Ready., MD Past Week Active Self, Pharmacy Records  promethazine (PHENERGAN) 25 MG tablet [id number]  Take 1 tablet (25 mg total) by mouth every 6 (six) hours as needed for nausea  or vomiting. Lovie Macadamia, MD  Active   propranolol (INDERAL) 10 MG tablet [id number] No TAKE 1 TO 4 TABLETS BY MOUTH TWICE DAILY AS NEEDED FOR TREMORS Cottle, Steva Ready., MD Taking Active Self, Pharmacy Records           Med Note [place], AMY L   Tue Jun 02, 2023  4:48 PM) Patient hasn't started taking medication due to illness.   QUEtiapine (SEROQUEL) 200 MG tablet [id number] No Take 2 tablets (400 mg total) by mouth at bedtime. Cottle, Steva Ready., MD Past Month Active Self, Pharmacy Records  RINVOQ 15 [place] WC37 [id number] No Take 15 mg by mouth daily. [provider] 06/01/2023 Active Self, Pharmacy Records  zolpidem (AMBIEN) 10 MG tablet [id number] No TAKE 1 TO 1 AND 1/2 TABLETS BY MOUTH AT NIGHT AS NEEDED FOR SLEEP  Patient taking differently: Take 10 mg by mouth at bedtime. TAKE 1 TO 1 AND 1/2 TABLETS BY MOUTH AT NIGHT AS NEEDED FOR SLEEP   Cottle, Steva Ready., MD Past Week Active Self, Pharmacy Records            Home Care and Equipment/Supplies: Were Home Health Services Ordered?: NA Any new equipment or medical supplies ordered?: NA  Functional Questionnaire: Do you need assistance with bathing/showering or dressing?: Yes Do you need assistance with meal preparation?: Yes Do you need assistance with eating?: No Do you have difficulty maintaining continence: No Do you need assistance with getting out of bed/getting out of a chair/moving?: No Do you have difficulty managing or taking your medications?: Yes  Follow up appointments reviewed: PCP Follow-up appointment confirmed?: Yes Date of PCP follow-up appointment?: 06/18/23 Follow-up Provider: [facility] Follow-up appointment confirmed?: NA Do you need transportation to your follow-up appointment?: No Do you understand care options if your condition(s) worsen?: Yes-patient verbalized understanding    SIGNATURE Karena Addison, LPN [facility] Nurse Health Advisor Direct Dial [phone]

## 2023-06-09 NOTE — Transitions of Care (Post Inpatient/ED Visit) (Signed)
06/09/2023  Name: David [name] MRN: [id number] DOB: [birth year]  Today's TOC FU Call Status: Today's TOC FU Call Status:: Unsuccessful Call (2nd Attempt) Unsuccessful Call (1st Attempt) Date: 06/05/23 Unsuccessful Call (2nd Attempt) Date: 06/09/23  Attempted to reach the patient regarding the most recent Inpatient/ED visit.  Follow Up Plan: Additional outreach attempts will be made to reach the patient to complete the Transitions of Care (Post Inpatient/ED visit) call.   Signature Karena Addison, LPN [facility] Nurse Health Advisor Direct Dial [phone]

## 2023-06-10 NOTE — Telephone Encounter (Signed)
Left message for pt to call back.  Jenel Lucks, MD Mr. Carlyon, The biopsies taken from your stomach were notable for mild chronic gastritis (inflammation) which is a common finding, but there was no evidence of Helicobacter pylori infection. This common finding is not likely to explain your symptoms or iron deficiency and there is no specific treatment or further evaluation recommended. The biopsies of your duodenum were normal.  There was no evidence celiac disease.  I recommend we repeat a CBC and iron panel in 1 month.  If your anemia/iron deficiency are not improving, I would recommend we proceed with a pill camera to evaluate your small intestines for a bleeding source.

## 2023-06-11 ENCOUNTER — Other Ambulatory Visit: Payer: Self-pay | Admitting: Psychiatry

## 2023-06-11 ENCOUNTER — Telehealth: Payer: Self-pay | Admitting: Student

## 2023-06-11 ENCOUNTER — Telehealth: Payer: Self-pay

## 2023-06-11 NOTE — Telephone Encounter (Signed)
Patients spouse called she is requesting lab results. Please return her call @[phone]

## 2023-06-11 NOTE — Telephone Encounter (Signed)
I spoke with David [name] wife on the phone today.  She informing that the patient has been having difficulty taking his medications, and seems to be altered especially at nighttime.  He has shown increased confusion.  He is taking his Phenergan regularly, antibiotics relatively regularly, and the rest of his medications when possible.  She states that he has not had much nausea but this is difficult to interpret given that he is having so much trouble taking his medications.  She describes instances where she found him disoriented upon arriving home, or getting up in the middle of the night trying to take various medications.  I instructed her that he should be evaluated formally at the emergency department.  I also urged her to completely discontinue Phenergan in the meantime as it has anticholinergic effects which may lead to such presentation.  She informed me that she has taken the Phenergan and other medications away from him so he does not accidentally take the wrong medications.  I will attempt to call the patient back tonight when he and his wife are together.

## 2023-06-12 ENCOUNTER — Telehealth: Payer: Self-pay | Admitting: Student

## 2023-06-12 ENCOUNTER — Other Ambulatory Visit: Payer: Self-pay

## 2023-06-12 DIAGNOSIS — R110 Nausea: Secondary | ICD-10-CM

## 2023-06-12 DIAGNOSIS — D649 Anemia, unspecified: Secondary | ICD-10-CM

## 2023-06-12 MED ORDER — ONDANSETRON HCL 4 MG PO TABS: 4.0000 mg | ORAL_TABLET | Freq: Three times a day (TID) | ORAL | 0 refills | Status: AC | PRN

## 2023-06-12 NOTE — Telephone Encounter (Signed)
Result letter mailed to pt.

## 2023-06-12 NOTE — Telephone Encounter (Signed)
Lab orders and reminder in epic. 

## 2023-06-12 NOTE — Progress Notes (Signed)
Internal Medicine Clinic Attending  I was physically present during the key portions of the resident provided service and participated in the medical decision making of patient's management care. I reviewed pertinent patient test results.  The assessment, diagnosis, and plan were formulated together and I agree with the documentation in the resident's note.  Narendra, Nischal, MD  

## 2023-06-12 NOTE — Telephone Encounter (Signed)
I spoke with David [name] on the phone patient on 06/11/2023 late in the evening.. Patient's identity was confirmed using two patient specific identifiers.  Patient's wife also on the call for [place] at this time.  The patient's permission was given to discuss his health and medical conditions with his wife.  We discussed that his H. pylori biopsies were negative, but given the fact that he has completed a substantial portion of his H. pylori regiment as well as the consolation of signs symptoms of the patient was having a may be worthwhile complete the regimen.  The patient felt that that would be worthwhile to complete the regimen as well.  Patient with decreased confusion, alert and oriented when I spoke to him in the afternoon.  I think much of this may be due due to the Phenergan.  He endorses continued nausea, has had difficulty taking some of his medications.  Strict precautions were provided to the patient to seek medical attention should he have continued episodes of confusion or shortness symptoms take a turn for the worse.  Patient was appreciative of the call, demonstrate understanding of the plan.  I recommend he holds his Phenergan. I will a prescription for Zofran in the meantime as he completes his abx course.

## 2023-06-15 NOTE — Transitions of Care (Post Inpatient/ED Visit) (Signed)
06/15/2023  Name: David [name] MRN: [id number] DOB: 04/27/1956  Today's TOC FU Call Status: Today's TOC FU Call Status:: Unsuccessful Call (3rd Attempt) Unsuccessful Call (1st Attempt) Date: 06/05/23 Unsuccessful Call (2nd Attempt) Date: 06/09/23 Unsuccessful Call (3rd Attempt) Date: 06/15/23  Attempted to reach the patient regarding the most recent Inpatient/ED visit.  Follow Up Plan: No further outreach attempts will be made at this time. We have been unable to contact the patient.  Signature Karena Addison, LPN [facility] Nurse Health Advisor Direct Dial [phone]

## 2023-06-17 ENCOUNTER — Other Ambulatory Visit: Payer: Self-pay | Admitting: Psychiatry

## 2023-06-17 NOTE — Telephone Encounter (Signed)
Rf appropriate; LF 05/13/23; NV 07/06/23; LV 06/01/23- no encounter regarding Ambien.

## 2023-06-18 ENCOUNTER — Encounter: Payer: Self-pay | Admitting: Student

## 2023-06-18 ENCOUNTER — Other Ambulatory Visit: Payer: Self-pay

## 2023-06-18 ENCOUNTER — Ambulatory Visit: Payer: Medicare HMO | Admitting: Student

## 2023-06-18 VITALS — BP 170/93 | HR 85 | Temp 97.9°F | Ht 68.0 in | Wt 170.1 lb

## 2023-06-18 DIAGNOSIS — I10 Essential (primary) hypertension: Secondary | ICD-10-CM | POA: Diagnosis not present

## 2023-06-18 DIAGNOSIS — D508 Other iron deficiency anemias: Secondary | ICD-10-CM

## 2023-06-18 DIAGNOSIS — Z79899 Other long term (current) drug therapy: Secondary | ICD-10-CM

## 2023-06-18 DIAGNOSIS — R112 Nausea with vomiting, unspecified: Secondary | ICD-10-CM | POA: Diagnosis not present

## 2023-06-18 DIAGNOSIS — G40909 Epilepsy, unspecified, not intractable, without status epilepticus: Secondary | ICD-10-CM | POA: Diagnosis not present

## 2023-06-18 DIAGNOSIS — D509 Iron deficiency anemia, unspecified: Secondary | ICD-10-CM

## 2023-06-18 DIAGNOSIS — F418 Other specified anxiety disorders: Secondary | ICD-10-CM | POA: Diagnosis not present

## 2023-06-18 MED ORDER — AMLODIPINE-OLMESARTAN 5-20 MG PO TABS: 1.0000 | ORAL_TABLET | Freq: Every day | ORAL | 3 refills | Status: DC

## 2023-06-18 MED ORDER — ASPIRIN 81 MG PO TBEC: 81.0000 mg | DELAYED_RELEASE_TABLET | Freq: Every day | ORAL | 0 refills | Status: AC

## 2023-06-18 MED ORDER — ASPIRIN 81 MG PO TBEC: 81.0000 mg | DELAYED_RELEASE_TABLET | Freq: Every day | ORAL | 0 refills | Status: DC

## 2023-06-18 NOTE — Assessment & Plan Note (Signed)
Initially positive for H pylori breath test on 03/31/2023 consequently initiating treatment with triple therapy, presented to [facility] on 9/17 for intractable nausea and vomiting, admitted IMTS. 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 He was treated with Quad therapy and sent home to complete the course; however biopsy of the gastric mucosa was negative for H.pylori. He stopped taking the antibiotics 9/30, Sunday. Pt reports that after coming home, vomiting had subsided, nausea was still present, however improving. Patient is able to keep food down, eating soft foods and drinking Ensure.  -Recheck BMP for electrolytes disturbances -Zofran 4 mg as needed for nausea  -CONTINUE Protonix 40mg  twice daily

## 2023-06-18 NOTE — Progress Notes (Addendum)
Established Patient Office Visit  Subjective   Patient ID: David [name], male    DOB: 1955-12-01  Age: 67 y.o. MRN: [id number]  Chief Complaint  Patient presents with   Transitions Of Care    HPI  This is a 67 year old male living with a history stated below and presents today for HFU. Please see problem based assessment and plan for additional details.   Patient Active Problem List   Diagnosis Date Noted   Loss of weight 06/04/2023   Encounter for health maintenance examination 03/31/2023   Tobacco use disorder 03/31/2023   Nausea; previous history of intractable vomiting. 03/31/2023   Toxic encephalopathy 03/19/2023   Episodic mood disorder (HCC) 03/19/2023   Seizure disorder (HCC) 10/03/2021   Polypharmacy 10/03/2021   Primary osteoarthritis of left hip 05/27/2019   GAD (generalized anxiety disorder) 08/03/2018   ADD (attention deficit disorder) 08/03/2018   Closed right hip fracture, initial encounter (HCC) 06/23/2018   HTN (hypertension) 06/23/2018   Depression with anxiety 06/23/2018   Iron deficiency anemia 06/23/2018   Alcohol abuse with alcohol-induced mood disorder (HCC) 03/16/2018   Past Medical History:  Diagnosis Date   ADD (attention deficit disorder)    Anemia    Microcytic   Arthritis    Bipolar disorder (HCC)    Depression    Dyspnea    history of no current issues 05/26/2019   GAD (generalized anxiety disorder)    History of kidney stones    passed   HTN (hypertension)    PTSD (post-traumatic stress disorder)    Skin cancer    Past Surgical History:  Procedure Laterality Date   BACK SURGERY     Fusion - lumbar   BIOPSY  06/04/2023   Procedure: BIOPSY;  Surgeon: Jenel Lucks, MD;  Location: [facility] ENDOSCOPY;  Service: Gastroenterology;;   COLONOSCOPY WITH PROPOFOL N/A 06/04/2023   Procedure: COLONOSCOPY WITH PROPOFOL;  Surgeon: Jenel Lucks, MD;  Location: [facility] ENDOSCOPY;  Service: Gastroenterology;  Laterality: N/A;    ESOPHAGOGASTRODUODENOSCOPY (EGD) WITH PROPOFOL N/A 06/04/2023   Procedure: ESOPHAGOGASTRODUODENOSCOPY (EGD) WITH PROPOFOL;  Surgeon: Jenel Lucks, MD;  Location: [facility] ENDOSCOPY;  Service: Gastroenterology;  Laterality: N/A;   HIP PINNING,CANNULATED Right 06/24/2018   Procedure: RIGHT CANNULATED HIP PINNING;  Surgeon: Terance Hart, MD;  Location: [facility] ORS;  Service: Orthopedics;  Laterality: Right;   LITHOTRIPSY     MULTIPLE EXTRACTIONS WITH ALVEOLOPLASTY Bilateral 05/03/2019   Procedure: MULTIPLE EXTRACTION TEETH NUMBER TWO, THREE, FIVE, SIX, SEVEN, EIGHT, NINE, TEN, ELEVEN, FIFTEEN, SIXTEEN, SEVENTEEN, NINETEEN, TWENTY, TWENTY-ONE, TWENTY-TWO, TWENTY-THREE, TWENTY-FOUR, TWENTY-FIVE, TWENTY-SIX, TWENTY-SEVEN, TWENTY-EIGHT, TWENTY-NINE, THIRTY-TWO WITH ALVEOLOPLASTY;  Surgeon: Ocie Doyne, DDS;  Location: [facility] OR;  Service: Oral Surgery;  Laterality: Bilateral;   NOSE SURGERY     x3   TOTAL HIP ARTHROPLASTY Left 05/27/2019   Procedure: TOTAL HIP ARTHROPLASTY ANTERIOR APPROACH;  Surgeon: Jodi Geralds, MD;  Location: [facility] ORS;  Service: Orthopedics;  Laterality: Left;   Social History   Tobacco Use   Smoking status: Every Day    Current packs/day: 0.00    Average packs/day: 1 pack/day for 40.0 years (40.0 ttl pk-yrs)    Types: Cigarettes    Start date: 04/25/1979    Last attempt to quit: 04/25/2019    Years since quitting: 4.1   Smokeless tobacco: Never  Vaping Use   Vaping status: Never Used  Substance Use Topics   Alcohol use: Yes    Comment: daily-6-7/day   Drug use: Not Currently  Types: Marijuana   Family History  Problem Relation Age of Onset   Arthritis Other    Heart disease Other    Cancer Other    Hypertension Other    Allergies  Allergen Reactions   Other Other (See Comments)    Has eczema, so no strong detergents or dryer sheets   Codeine Nausea Only    Review of Systems  Constitutional:  Positive for chills and malaise/fatigue. Negative for fever.  HENT:   Negative for congestion and sore throat.   Respiratory:  Negative for hemoptysis and shortness of breath.   Cardiovascular:  Negative for chest pain and leg swelling.  Gastrointestinal:  Positive for nausea. Negative for blood in stool, constipation, diarrhea, heartburn and vomiting.  Genitourinary:  Negative for dysuria and hematuria.  Neurological:  Negative for dizziness and weakness.  Endo/Heme/Allergies:  Does not bruise/bleed easily.      Objective:     BP (!) 170/93 (BP Location: Left Arm, Cuff Size: Normal)   Pulse 85   Temp 97.9 F (36.6 C) (Oral)   Ht 5\' 8"  (1.727 m)   Wt 170 lb 1.6 oz (77.2 kg)   SpO2 99%   BMI 25.86 kg/m  BP Readings from Last 3 Encounters:  06/18/23 (!) 170/93  06/04/23 (!) 162/106  06/02/23 119/74   Wt Readings from Last 3 Encounters:  06/18/23 170 lb 1.6 oz (77.2 kg)  06/04/23 180 lb (81.6 kg)  06/02/23 179 lb (81.2 kg)      Physical Exam  Constitutional: well-appearing, sitting in chair, in no acute distress HENT: normocephalic atraumatic, mucous membranes moist Cardiovascular: regular rate and rhythm, no m/r/g Pulmonary/Chest: normal work of breathing on room air, lungs clear to auscultation bilaterally Abdominal: soft, non-tender, non-distended, bowel sounds present MSK: normal bulk and tone Neurological: alert & oriented x 4, no focal deficit Skin: warm and dry Psych: normal mood and behavior  Results for orders placed or performed in visit on 06/18/23  CBC no Diff  Result Value Ref Range   WBC 7.2 3.4 - 10.8 x10E3/uL   RBC 4.52 4.14 - 5.80 x10E6/uL   Hemoglobin 11.9 (L) 13.0 - 17.7 g/dL   Hematocrit 06.2 37.6 - 51.0 %   MCV 83 79 - 97 fL   MCH 26.3 (L) 26.6 - 33.0 pg   MCHC 31.6 31.5 - 35.7 g/dL   RDW 28.3 (H) 15.1 - 76.1 %   Platelets 399 150 - 450 x10E3/uL  Iron, TIBC and Ferritin Panel  Result Value Ref Range   Total Iron Binding Capacity 351 250 - 450 ug/dL   UIBC 607 371 - 062 ug/dL   Iron 28 (L) 38 - 694 ug/dL    Iron Saturation 8 (LL) 15 - 55 %   Ferritin 30 30 - 400 ng/mL  BMP8+Anion Gap  Result Value Ref Range   Glucose 104 (H) 70 - 99 mg/dL   BUN 10 8 - 27 mg/dL   Creatinine, Ser 8.54 0.76 - 1.27 mg/dL   eGFR 85 >62 VO/JJK/0.93   BUN/Creatinine Ratio 10 10 - 24   Sodium 141 134 - 144 mmol/L   Potassium 4.6 3.5 - 5.2 mmol/L   Chloride 101 96 - 106 mmol/L   CO2 23 20 - 29 mmol/L   Anion Gap 17.0 10.0 - 18.0 mmol/L   Calcium 10.0 8.6 - 10.2 mg/dL  Lipid Profile  Result Value Ref Range   Cholesterol, Total 203 (H) 100 - 199 mg/dL   Triglycerides 818 (H) 0 -  149 mg/dL   HDL 71 >16 mg/dL   VLDL Cholesterol Cal 27 5 - 40 mg/dL   LDL Chol Calc (NIH) 109 (H) 0 - 99 mg/dL   Chol/HDL Ratio 2.9 0.0 - 5.0 ratio    Last CBC Lab Results  Component Value Date   WBC 7.2 06/18/2023   HGB 11.9 (L) 06/18/2023   HCT 37.6 06/18/2023   MCV 83 06/18/2023   MCH 26.3 (L) 06/18/2023   RDW 15.6 (H) 06/18/2023   PLT 399 06/18/2023   Last metabolic panel Lab Results  Component Value Date   GLUCOSE 104 (H) 06/18/2023   NA 141 06/18/2023   K 4.6 06/18/2023   CL 101 06/18/2023   CO2 23 06/18/2023   BUN 10 06/18/2023   CREATININE 0.98 06/18/2023   GFRNONAA >60 06/04/2023   CALCIUM 10.0 06/18/2023   PHOS 3.5 02/25/2023   PROT 6.9 06/03/2023   ALBUMIN 3.3 (L) 06/03/2023   LABGLOB 2.4 03/17/2022   AGRATIO 1.8 03/17/2022   BILITOT 0.6 06/03/2023   ALKPHOS 91 06/03/2023   AST 15 06/03/2023   ALT 12 06/03/2023   ANIONGAP 8 06/04/2023      The 10-year ASCVD risk score (Arnett DK, et al., 2019) is: 27.8%    Assessment & Plan:   Problem List Items Addressed This Visit       Cardiovascular and Mediastinum   HTN (hypertension)    BP Readings from Last 3 Encounters:  06/18/23 (!) 170/93  06/04/23 (!) 162/106  06/02/23 119/74   Patient reports he is currently not taking any blood pressure medications since he has been discharged from the hospital.  His medication regimen included  amlodipine 5 mg daily and clonidine 0.2 mg twice daily.  He has not had these medications for the past 2 weeks.  Patient today denies any chest pain, vision changes, shortness of breath, lower extremity swelling. -Stop amlodipine 5 mg -Stop clonidine -START amlodipine-olmesartan 5-20 mg, daily -Will follow back in one month and recheck BMP      Relevant Medications   amLODipine-olmesartan (AZOR) 5-20 MG tablet   aspirin EC 81 MG tablet     Digestive   Nausea; previous history of intractable vomiting. - Primary (Chronic)    Initially positive for H pylori breath test on 03/31/2023 consequently initiating treatment with triple therapy, presented to [facility] on 9/17 for intractable nausea and vomiting, admitted IMTS. 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 He was treated with Quad therapy and sent home to complete the course; however biopsy of the gastric mucosa was negative for H.pylori. He stopped taking the antibiotics 9/30, Sunday. Pt reports that after coming home, vomiting had subsided, nausea was still present, however improving. Patient is able to keep food down, eating soft foods and drinking Ensure.  -Recheck BMP for electrolytes disturbances -Zofran 4 mg as needed for nausea  -CONTINUE Protonix 40mg  twice daily        Relevant Orders   CBC no Diff (Completed)   BMP8+Anion Gap (Completed)     Nervous and Auditory   Seizure disorder (HCC)    Lamotrigine level was checked on 06/03/2023 that showed 3.7 (2.0-20.0). patient is currently not taking Lamictal for the past two weeks due to nausea. Pt is advised to restart.  -RESTART Lamictal 100 mg twice daily         Other   Depression with anxiety    Patient reports that he is not taking his mental health medications  for the past 2 weeks.  He started taking his Prozac yesterday.  He is not taking clonidine, Atarax, Ambien.  Wife notes that she gave him Ambien at night and made him become more  agitated and confused.  Patient was advised to follow-up with his psychiatrist in terms of starting the medications.  Currently he can continue taking the Prozac 20 mg daily and Seroquel as needed at bedtime.  He should stop taking Ambien, promethazine, Lyrica, modafinil, Atarax, clonidine.      Iron deficiency anemia    Lab Results  Component Value Date   WBC 5.0 06/03/2023   HGB 9.6 (L) 06/03/2023   HCT 29.1 (L) 06/03/2023   MCV 79.1 (L) 06/03/2023   PLT 482 (H) 06/03/2023   Lab Results  Component Value Date   FERRITIN 31 02/24/2023    Lab Results  Component Value Date   IRON 28 (L) 02/24/2023   TIBC 389 02/24/2023   FERRITIN 31 02/24/2023   Patient reports IDA for the past 5 years, previously on PO iron, does not know if he is taking them. Colonoscopy on 06/04/2023 negative for colonic neoplasia, inadequate prep. Repeat in 3 years.  -Will check iron studies and ferritin panel -Once results are back, will consider starting PO iron daily for 4-6 weeks, recheck iron studies at the next office visit.       Relevant Orders   CBC no Diff (Completed)   Iron, TIBC and Ferritin Panel (Completed)   Lipid Profile (Completed)   Polypharmacy    Patient is on multiple medications for his chronic conditions that include hypertension, seizure disorder, mental health, nausea and vomiting.  Patient presented today stating that he is not taking his medications for his chronic conditions for the past 2 weeks, since he has been discharged from the hospital.  The only medication he was taking was the quadruple therapy which he stopped on June 14, 2023; and Zofran.  He is not taking any other medications due to ongoing nausea.  In today's visit, we had an extensive conversation with the patient and wife who was present to consolidate his medications.  Patient was asked to schedule an appointment with his psychiatrist, Dr. [name] for his psych medications.  We asked the patient to continue taking  Prozac and Seroquel.  And discontinue taking clonidine, Atarax, Lyrica, propranolol, Ambien.  Patient was advised to see his psychiatrist prior to starting any of these medications.       Return in about 1 month (around 07/19/2023) for BMP, HTN, Nausea.    Jeral Pinch, DO

## 2023-06-18 NOTE — Assessment & Plan Note (Addendum)
Lab Results  Component Value Date   WBC 5.0 06/03/2023   HGB 9.6 (L) 06/03/2023   HCT 29.1 (L) 06/03/2023   MCV 79.1 (L) 06/03/2023   PLT 482 (H) 06/03/2023   Lab Results  Component Value Date   FERRITIN 31 02/24/2023    Lab Results  Component Value Date   IRON 28 (L) 02/24/2023   TIBC 389 02/24/2023   FERRITIN 31 02/24/2023   Patient reports IDA for the past 5 years, previously on PO iron, does not know if he is taking them. Colonoscopy on 06/04/2023 negative for colonic neoplasia, inadequate prep. Repeat in 3 years.  -Will check iron studies and ferritin panel -Once results are back, will consider starting PO iron daily for 4-6 weeks, recheck iron studies at the next office visit.

## 2023-06-18 NOTE — Patient Instructions (Signed)
Thank you, [name] for allowing [place] to provide your care today. Today we discussed:  Lets get you back on your MEDICATIONS :)   Nausea and Vomiting -Protonix (Pantoprazole) 40 mg, take 1 tablet by mouth twice daily.  This medication coats your stomach with acid.  -Zofran (ondansetron) 4 mg, take one talet by mouth every 8 hours as needed for nausea   Seizure - Lamictal (Lamotrigine) 100 mg, take one tablet by mouth twice daily.   Blood pressure - AZOR (amlodipine-olmesartan) 5-20 mg, take one tablet by mouth daily.  -Aspirin EC, 81 mg, take one tablet by mouth daily   Mental health  -Prozac (fluoxetine) 20 mg, take one tablet by mouth daily -Seroquel (Quetiapine): ASK Dr. Jennelle [name] for refill on this medication.  - PLEASE SCHEDULE AN APPOINTMENT WITH DR. COTTLE.   I have ordered the following labs for you:  Lab Orders         CBC no Diff         Iron, TIBC and Ferritin Panel         BMP8+Anion Gap         Lipid Profile      Tests ordered today:  As above. I will call you on your results.   Referrals ordered today:   Referral Orders  No referral(s) requested today     I have ordered the following medication/changed the following medications:   Stop the following medications: Medications Discontinued During This Encounter  Medication Reason   bismuth-metronidazole-tetracycline (PYLERA) 140-125-125 MG capsule    cloNIDine (CATAPRES) 0.2 MG tablet    amLODipine (NORVASC) 5 MG tablet    promethazine (PHENERGAN) 25 MG tablet    hydrOXYzine (ATARAX) 25 MG tablet    aspirin EC 81 MG tablet    amLODipine-olmesartan (AZOR) 5-20 MG tablet    aspirin EC 81 MG tablet    modafinil (PROVIGIL) 200 MG tablet No longer needed (for PRN medications)   pregabalin (LYRICA) 150 MG capsule No longer needed (for PRN medications)   propranolol (INDERAL) 10 MG tablet No longer needed (for PRN medications)   zolpidem (AMBIEN) 10 MG tablet No longer needed (for PRN medications)    fluorouracil (EFUDEX) 5 % cream No longer needed (for PRN medications)     Start the following medications: Meds ordered this encounter  Medications   DISCONTD: amLODipine-olmesartan (AZOR) 5-20 MG tablet    Sig: Take 1 tablet by mouth daily.    Dispense:  90 tablet    Refill:  3   DISCONTD: aspirin EC 81 MG tablet    Sig: Take 1 tablet (81 mg total) by mouth daily. Swallow whole.    Dispense:  360 tablet    Refill:  0   amLODipine-olmesartan (AZOR) 5-20 MG tablet    Sig: Take 1 tablet by mouth daily.    Dispense:  90 tablet    Refill:  3   aspirin EC 81 MG tablet    Sig: Take 1 tablet (81 mg total) by mouth daily. Swallow whole.    Dispense:  360 tablet    Refill:  0     Follow up:  1 month      Remember:   Should you have any questions or concerns please call the internal medicine clinic at [phone].     David Pinch, DO [facility]

## 2023-06-18 NOTE — Assessment & Plan Note (Signed)
Patient reports that he is not taking his mental health medications for the past 2 weeks.  He started taking his Prozac yesterday.  He is not taking clonidine, Atarax, Ambien.  Wife notes that she gave him Ambien at night and made him become more agitated and confused.  Patient was advised to follow-up with his psychiatrist in terms of starting the medications.  Currently he can continue taking the Prozac 20 mg daily and Seroquel as needed at bedtime.  He should stop taking Ambien, promethazine, Lyrica, modafinil, Atarax, clonidine.

## 2023-06-18 NOTE — Assessment & Plan Note (Signed)
Lamotrigine level was checked on 06/03/2023 that showed 3.7 (2.0-20.0). patient is currently not taking Lamictal for the past two weeks due to nausea. Pt is advised to restart.  -RESTART Lamictal 100 mg twice daily

## 2023-06-18 NOTE — Assessment & Plan Note (Signed)
Patient is on multiple medications for his chronic conditions that include hypertension, seizure disorder, mental health, nausea and vomiting.  Patient presented today stating that he is not taking his medications for his chronic conditions for the past 2 weeks, since he has been discharged from the hospital.  The only medication he was taking was the quadruple therapy which he stopped on June 14, 2023; and Zofran.  He is not taking any other medications due to ongoing nausea.  In today's visit, we had an extensive conversation with the patient and wife who was present to consolidate his medications.  Patient was asked to schedule an appointment with his psychiatrist, Dr. [name] for his psych medications.  We asked the patient to continue taking Prozac and Seroquel.  And discontinue taking clonidine, Atarax, Lyrica, propranolol, Ambien.  Patient was advised to see his psychiatrist prior to starting any of these medications.

## 2023-06-18 NOTE — Assessment & Plan Note (Signed)
BP Readings from Last 3 Encounters:  06/18/23 (!) 170/93  06/04/23 (!) 162/106  06/02/23 119/74   Patient reports he is currently not taking any blood pressure medications since he has been discharged from the hospital.  His medication regimen included amlodipine 5 mg daily and clonidine 0.2 mg twice daily.  He has not had these medications for the past 2 weeks.  Patient today denies any chest pain, vision changes, shortness of breath, lower extremity swelling. -Stop amlodipine 5 mg -Stop clonidine -START amlodipine-olmesartan 5-20 mg, daily -Will follow back in one month and recheck BMP

## 2023-06-19 ENCOUNTER — Other Ambulatory Visit: Payer: Self-pay | Admitting: Student

## 2023-06-19 LAB — BMP8+ANION GAP
Anion Gap: 17 mmol/L (ref 10.0–18.0)
BUN/Creatinine Ratio: 10 (ref 10–24)
BUN: 10 mg/dL (ref 8–27)
CO2: 23 mmol/L (ref 20–29)
Calcium: 10 mg/dL (ref 8.6–10.2)
Chloride: 101 mmol/L (ref 96–106)
Creatinine, Ser: 0.98 mg/dL (ref 0.76–1.27)
Glucose: 104 mg/dL — ABNORMAL HIGH (ref 70–99)
Potassium: 4.6 mmol/L (ref 3.5–5.2)
Sodium: 141 mmol/L (ref 134–144)
eGFR: 85 mL/min/{1.73_m2} (ref >59)

## 2023-06-19 LAB — LIPID PANEL
Chol/HDL Ratio: 2.9 {ratio} (ref 0.0–5.0)
Cholesterol, Total: 203 mg/dL — ABNORMAL HIGH (ref 100–199)
HDL: 71 mg/dL (ref >39)
LDL Chol Calc (NIH): 105 mg/dL — ABNORMAL HIGH (ref 0–99)
Triglycerides: 159 mg/dL — ABNORMAL HIGH (ref 0–149)
VLDL Cholesterol Cal: 27 mg/dL (ref 5–40)

## 2023-06-19 LAB — CBC
Hematocrit: 37.6 % (ref 37.5–51.0)
Hemoglobin: 11.9 g/dL — ABNORMAL LOW (ref 13.0–17.7)
MCH: 26.3 pg — ABNORMAL LOW (ref 26.6–33.0)
MCHC: 31.6 g/dL (ref 31.5–35.7)
MCV: 83 fL (ref 79–97)
Platelets: 399 10*3/uL (ref 150–450)
RBC: 4.52 x10E6/uL (ref 4.14–5.80)
RDW: 15.6 % — ABNORMAL HIGH (ref 11.6–15.4)
WBC: 7.2 10*3/uL (ref 3.4–10.8)

## 2023-06-19 LAB — IRON,TIBC AND FERRITIN PANEL
Ferritin: 30 ng/mL (ref 30–400)
Iron Saturation: 8 % — CL (ref 15–55)
Iron: 28 ug/dL — ABNORMAL LOW (ref 38–169)
Total Iron Binding Capacity: 351 ug/dL (ref 250–450)
UIBC: 323 ug/dL (ref 111–343)

## 2023-06-22 ENCOUNTER — Other Ambulatory Visit: Payer: Self-pay | Admitting: Student

## 2023-06-22 ENCOUNTER — Encounter: Payer: Self-pay | Admitting: Student

## 2023-06-22 MED ORDER — FERROUS SULFATE 325 (65 FE) MG PO TBEC: 325.0000 mg | DELAYED_RELEASE_TABLET | Freq: Every day | ORAL | 0 refills | Status: AC

## 2023-06-22 MED ORDER — ATORVASTATIN CALCIUM 40 MG PO TABS: 40.0000 mg | ORAL_TABLET | Freq: Every day | ORAL | 11 refills | Status: AC

## 2023-06-22 NOTE — Progress Notes (Signed)
Internal Medicine Clinic Attending  I was physically present during the key portions of the resident provided service and participated in the medical decision making of patient's management care. I reviewed pertinent patient test results.  The assessment, diagnosis, and plan were formulated together and I agree with the documentation in the resident's note.  Mercie Eon, MD   Since patient has been off most of his medicines since hospitalization, we spent lots of today's visit performing a med rec, and stopping lots of his centrally acting medicines.  Dr. Laney Potash is recommending oral iron, which I agree with. At next visit, can discuss with patient returning to GI for further workup of his IDA.

## 2023-06-28 ENCOUNTER — Other Ambulatory Visit: Payer: Self-pay | Admitting: Psychiatry

## 2023-06-29 ENCOUNTER — Ambulatory Visit: Payer: Medicare HMO | Admitting: Psychiatry

## 2023-06-29 NOTE — Telephone Encounter (Signed)
Rf appropriate- however, note from pharmacy says it was discontinued by another provider on 06/18/23- no telephone encounter. Nv 07/06/23

## 2023-06-30 NOTE — Telephone Encounter (Signed)
Pt called at 11:50 requesting refill of Pregabalin to [facility].  Next appt 10/21

## 2023-07-01 NOTE — Telephone Encounter (Signed)
Pt called and needs his lyrica 150 mg refilled. The pharmacy said his original script has expired. Please send to [facility] on [address]

## 2023-07-06 ENCOUNTER — Encounter: Payer: Self-pay | Admitting: Psychiatry

## 2023-07-06 ENCOUNTER — Ambulatory Visit (INDEPENDENT_AMBULATORY_CARE_PROVIDER_SITE_OTHER): Payer: Medicare HMO | Admitting: Psychiatry

## 2023-07-06 DIAGNOSIS — F39 Unspecified mood [affective] disorder: Secondary | ICD-10-CM

## 2023-07-06 DIAGNOSIS — M797 Fibromyalgia: Secondary | ICD-10-CM

## 2023-07-06 DIAGNOSIS — F411 Generalized anxiety disorder: Secondary | ICD-10-CM

## 2023-07-06 DIAGNOSIS — F902 Attention-deficit hyperactivity disorder, combined type: Secondary | ICD-10-CM | POA: Diagnosis not present

## 2023-07-06 DIAGNOSIS — F5105 Insomnia due to other mental disorder: Secondary | ICD-10-CM

## 2023-07-06 DIAGNOSIS — F1020 Alcohol dependence, uncomplicated: Secondary | ICD-10-CM

## 2023-07-06 MED ORDER — PREGABALIN 100 MG PO CAPS
100.0000 mg | ORAL_CAPSULE | Freq: Two times a day (BID) | ORAL | 1 refills | Status: DC
Start: 2023-07-06 — End: 2023-08-31

## 2023-07-06 MED ORDER — MODAFINIL 200 MG PO TABS: 200.0000 mg | ORAL_TABLET | Freq: Every day | ORAL | 1 refills | Status: DC

## 2023-07-10 ENCOUNTER — Other Ambulatory Visit: Payer: Self-pay | Admitting: Psychiatry

## 2023-07-16 ENCOUNTER — Other Ambulatory Visit: Payer: Self-pay | Admitting: Psychiatry

## 2023-07-16 DIAGNOSIS — F411 Generalized anxiety disorder: Secondary | ICD-10-CM

## 2023-07-16 DIAGNOSIS — G8929 Other chronic pain: Secondary | ICD-10-CM

## 2023-07-16 NOTE — Telephone Encounter (Signed)
Refused dt dose change 10/21

## 2023-07-20 ENCOUNTER — Encounter: Payer: Medicare HMO | Admitting: Student

## 2023-08-02 ENCOUNTER — Other Ambulatory Visit: Payer: Self-pay | Admitting: Psychiatry

## 2023-08-02 ENCOUNTER — Other Ambulatory Visit: Payer: Self-pay | Admitting: Student

## 2023-08-16 ENCOUNTER — Other Ambulatory Visit: Payer: Self-pay | Admitting: Psychiatry

## 2023-08-31 ENCOUNTER — Other Ambulatory Visit: Payer: Self-pay | Admitting: Psychiatry

## 2023-08-31 DIAGNOSIS — M797 Fibromyalgia: Secondary | ICD-10-CM

## 2023-08-31 DIAGNOSIS — F308 Other manic episodes: Secondary | ICD-10-CM

## 2023-08-31 DIAGNOSIS — F411 Generalized anxiety disorder: Secondary | ICD-10-CM

## 2023-08-31 DIAGNOSIS — F5105 Insomnia due to other mental disorder: Secondary | ICD-10-CM

## 2023-08-31 MED ORDER — ZOLPIDEM TARTRATE 10 MG PO TABS: ORAL_TABLET | ORAL | 0 refills | Status: DC

## 2023-08-31 MED ORDER — PREGABALIN 100 MG PO CAPS: 100.0000 mg | ORAL_CAPSULE | Freq: Two times a day (BID) | ORAL | 0 refills | Status: DC

## 2023-08-31 NOTE — Telephone Encounter (Signed)
The Remus Loffler was filled last month and I don't see where we stopped it so I will renew it

## 2023-08-31 NOTE — Telephone Encounter (Signed)
LF 11/2 LV 10/21; REFUSED LYRICA 150 DT DOSE CHANGE 10/21

## 2023-09-05 ENCOUNTER — Other Ambulatory Visit: Payer: Self-pay | Admitting: Psychiatry

## 2023-09-05 DIAGNOSIS — M797 Fibromyalgia: Secondary | ICD-10-CM

## 2023-09-05 DIAGNOSIS — F411 Generalized anxiety disorder: Secondary | ICD-10-CM

## 2023-09-06 NOTE — Telephone Encounter (Signed)
Has appt. tomorrow

## 2023-09-07 ENCOUNTER — Ambulatory Visit: Payer: Medicare HMO | Admitting: Psychiatry

## 2023-09-07 ENCOUNTER — Encounter: Payer: Self-pay | Admitting: Psychiatry

## 2023-09-07 DIAGNOSIS — I10 Essential (primary) hypertension: Secondary | ICD-10-CM

## 2023-09-07 DIAGNOSIS — F902 Attention-deficit hyperactivity disorder, combined type: Secondary | ICD-10-CM

## 2023-09-07 DIAGNOSIS — F1020 Alcohol dependence, uncomplicated: Secondary | ICD-10-CM

## 2023-09-07 DIAGNOSIS — F39 Unspecified mood [affective] disorder: Secondary | ICD-10-CM | POA: Diagnosis not present

## 2023-09-07 DIAGNOSIS — M5440 Lumbago with sciatica, unspecified side: Secondary | ICD-10-CM

## 2023-09-07 DIAGNOSIS — F5105 Insomnia due to other mental disorder: Secondary | ICD-10-CM | POA: Diagnosis not present

## 2023-09-07 DIAGNOSIS — F411 Generalized anxiety disorder: Secondary | ICD-10-CM | POA: Diagnosis not present

## 2023-09-07 DIAGNOSIS — M797 Fibromyalgia: Secondary | ICD-10-CM

## 2023-09-07 DIAGNOSIS — G8929 Other chronic pain: Secondary | ICD-10-CM

## 2023-09-07 MED ORDER — PREGABALIN 100 MG PO CAPS
100.0000 mg | ORAL_CAPSULE | Freq: Two times a day (BID) | ORAL | 3 refills | Status: DC
Start: 2023-09-07 — End: 2023-12-29

## 2023-09-07 MED ORDER — MODAFINIL 200 MG PO TABS
200.0000 mg | ORAL_TABLET | Freq: Every day | ORAL | 1 refills | Status: DC
Start: 2023-09-07 — End: 2024-05-12

## 2023-09-07 MED ORDER — FLUOXETINE HCL 20 MG PO CAPS
40.0000 mg | ORAL_CAPSULE | Freq: Every day | ORAL | 1 refills | Status: DC
Start: 2023-09-07 — End: 2023-12-29

## 2023-09-07 NOTE — Progress Notes (Signed)
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 so bad.  We discussed side effects in detail including fall risk.   Disc mood effects and other FDA indications for quetiapine. Call if needed for higher dose which could help mood anxiety and sleep and stability.  Disc recent dx low FE and how supplement can help him get better.  Rinvoq helped his psoriasis markedly.  So less itching   Discussed potential metabolic side effects associated with atypical antipsychotics, as well as potential risk for movement side effects. Advised pt to contact office if movement side effects occur.  Disc risk inconsistency of meds including recurrent SZ.   Also disc risk polypharmacy given recent 6/11 and 03/19/23 and reviewed the notes from the hospital.    Reduced quetiapine 400 HS to reduce risk of repeated confusional episodes.  Emphasized don't overtake.  Continue lamotrigine 100 mg BID Continue fluoxetine 40 mg daily. Consider decrease continue  hydroxyzine 25 mg every 6 hours as needed,   continue Ambien 15 mg nightly.  Consider reduction. continue clonidine 0.2 mg BID for hypertension and off label anxiety and irritability If modafinil 200 doesn't help stop it.    Better back on Lyrica 100 mg BID  DC Adderall DT SZ 09/23/21 He insist that his SZ was related to drinking and other substances and not Adderall.   Disc risk of repeated SZ and restrictions on driving.  No SZ.  Disc his desire to resume Adderall  bc he thinks the sz related to combining meds and alcohol but can't risk RX Adderall to him again.  Disc that he may be right, there has been no evidence of Adderall abuse and he has taken it for years without SZ.  He was drinking too much at time of SZ and that could have been the cause for the SZ.  However , neuro rec stop stimulant so it is difficult for [place] to RX stimulant again. ADD much worse off stimulant and can't get something done.   He is struggling with productivity and concentration.  This is contributing to some depression. Disc pros/cons of modafinil.  He's still not sure about it's effect but thinks he tolerates it and wants to continue the trial.  He still would prefer the Adderall.   Disc smoking cessation.  Patch vs Chantix.  No Chantix now DT nausea.  Must stop smoking if he starts the patch and wait until nausea is better.  Nicotine patch  Sober since July 2024 with neg UDS.  Does not appear to require inpatient rehab from clincal perspective.  Requirement for it legally may not be clinically necessary. Disc abstinence with drugs and alcohol  Will need to get testosterone from either PCP or urologist.  I wont RX this anymore bc I'm not comfortable doing so.  He stopped it bc at the time thought it was worsening psoriasis.    Disc risk of skipping lamotrigine or Lyrica and risk of SZ.  Fu 2 mos  Meredith Staggers, MD, DFAPA   Please see After Visit Summary for patient specific instructions.  No future  appointments.      No orders of the defined types were placed in this encounter.      -------------------------------

## 2023-09-24 ENCOUNTER — Other Ambulatory Visit: Payer: Self-pay | Admitting: Psychiatry

## 2023-09-24 DIAGNOSIS — F411 Generalized anxiety disorder: Secondary | ICD-10-CM

## 2023-09-24 DIAGNOSIS — G8929 Other chronic pain: Secondary | ICD-10-CM

## 2023-09-24 DIAGNOSIS — F5105 Insomnia due to other mental disorder: Secondary | ICD-10-CM

## 2023-09-24 DIAGNOSIS — F308 Other manic episodes: Secondary | ICD-10-CM

## 2023-09-29 ENCOUNTER — Telehealth: Payer: Self-pay

## 2023-09-29 NOTE — Telephone Encounter (Signed)
 Prior Authorization submitted and approved for Modafinil 200 mg with [employer] through 09/14/2024,[place] Case: [id number].

## 2023-10-01 ENCOUNTER — Other Ambulatory Visit: Payer: Self-pay | Admitting: Psychiatry

## 2023-10-01 DIAGNOSIS — F5105 Insomnia due to other mental disorder: Secondary | ICD-10-CM

## 2023-10-01 DIAGNOSIS — F308 Other manic episodes: Secondary | ICD-10-CM

## 2023-10-11 ENCOUNTER — Ambulatory Visit (HOSPITAL_COMMUNITY)
Admission: EM | Admit: 2023-10-11 | Discharge: 2023-10-11 | Disposition: A | Discharge status: Home / Self Care | Payer: Medicare HMO | Attending: Psychiatry | Admitting: Psychiatry

## 2023-10-11 DIAGNOSIS — F1211 Cannabis abuse, in remission: Secondary | ICD-10-CM | POA: Diagnosis not present

## 2023-10-11 DIAGNOSIS — F411 Generalized anxiety disorder: Secondary | ICD-10-CM | POA: Insufficient documentation

## 2023-10-11 DIAGNOSIS — F1990 Other psychoactive substance use, unspecified, uncomplicated: Secondary | ICD-10-CM

## 2023-10-11 DIAGNOSIS — F1011 Alcohol abuse, in remission: Secondary | ICD-10-CM | POA: Diagnosis not present

## 2023-10-11 DIAGNOSIS — F1010 Alcohol abuse, uncomplicated: Secondary | ICD-10-CM | POA: Diagnosis present

## 2023-10-11 DIAGNOSIS — Z653 Problems related to other legal circumstances: Secondary | ICD-10-CM | POA: Insufficient documentation

## 2023-10-11 DIAGNOSIS — F1311 Sedative, hypnotic or anxiolytic abuse, in remission: Secondary | ICD-10-CM | POA: Diagnosis not present

## 2023-10-11 DIAGNOSIS — F988 Other specified behavioral and emotional disorders with onset usually occurring in childhood and adolescence: Secondary | ICD-10-CM | POA: Insufficient documentation

## 2023-10-11 DIAGNOSIS — R000 Tachycardia, unspecified: Secondary | ICD-10-CM | POA: Diagnosis present

## 2023-10-11 DIAGNOSIS — F1411 Cocaine abuse, in remission: Secondary | ICD-10-CM | POA: Insufficient documentation

## 2023-10-11 DIAGNOSIS — F32A Depression, unspecified: Secondary | ICD-10-CM | POA: Diagnosis not present

## 2023-10-11 NOTE — ED Provider Notes (Signed)
Behavioral Health Urgent Care Medical Screening Exam  Patient Name: David [name] MRN: [id number] Date of Evaluation: 10/11/23 Chief Complaint:   Diagnosis:  Final diagnoses:  Substance use    History of Present illness: David [name] is a 68 y.o. male with a past psychiatric history that consist of depression, GAD, ADD, alcohol abuse and substance abuse who presented to the [facility] voluntary accompanied by his wife Naasir Carreira requesting drug and alcohol treatment programs, court ordered.   Patient reports failing a filed sobriety test one year ago for using benzodiazepines that were not prescribed to him. Patient is currently on probation and is court ordered to complete a substance abuse evaluation and attend a 7-day inpatient treatment program. Patient reports a history of abusing benzodiazepines, cocaine, marijuana and alcohol. He reports a sobriety from alcohol and marijuana for 9 months and reports that he has not used cocaine in years and last reported benzo use was last year.   On evaluation, patient is alert and oriented x 4. His thought process is linear and speech is clear and coherent. His mood is euthymic and affect is congruent. Patient denies SI/HI/AVH. There is no objective evidence that the patient is currently responding to internal or external stimuli. Patient is calm and cooperative and does not appear to be in acute distress.  Plan of care: Based on patient's evaluation today, patient advised to follow up with the list of outpatient services provided for a substance abuse evaluation and residential treatment options.   Flowsheet Row ED from 10/11/2023 in [facility] Admission (Discharged) from 06/02/2023 in [facility] GENERAL MED/SURG UNIT ED from 05/30/2023 in [facility] Emergency Department at [facility]  C-SSRS RISK CATEGORY No Risk No Risk No Risk       Psychiatric Specialty  Exam  Presentation  General Appearance:Appropriate for Environment  Eye Contact:Fair  Speech:Clear and Coherent  Speech Volume:Normal  Handedness:Right   Mood and Affect  Mood: Euthymic  Affect: Congruent   Thought Process  Thought Processes: Coherent  Descriptions of Associations:Intact  Orientation:Full (Time, Place and Person)  Thought Content:Logical    Hallucinations:None  Ideas of Reference:None  Suicidal Thoughts:No  Homicidal Thoughts:No   Sensorium  Memory: Immediate Fair; Recent Fair; Remote Fair  Judgment: Intact  Insight: Present   Executive Functions  Concentration: Fair  Attention Span: Fair  Recall: [employer] of Knowledge: Fair  Language: Fair   Psychomotor Activity  Psychomotor Activity: Normal   Assets  Assets: Manufacturing systems engineer; Desire for Improvement; Financial Resources/Insurance; Housing; Leisure Time; Intimacy; Social Support; Resilience; Transportation   Sleep  Sleep: Fair  Number of hours: No data recorded  Physical Exam: Physical Exam Cardiovascular:     Rate and Rhythm: Tachycardia present.     Comments: hypertensive Musculoskeletal:        General: Normal range of motion.  Neurological:     Mental Status: He is alert and oriented to person, place, and time.    Review of Systems  Constitutional: Negative.   HENT: Negative.    Eyes: Negative.   Respiratory: Negative.    Cardiovascular: Negative.   Gastrointestinal: Negative.   Genitourinary: Negative.   Musculoskeletal: Negative.   Neurological: Negative.   Endo/Heme/Allergies: Negative.    Blood pressure (!) 170/96, pulse (!) 104, temperature 98.2 F (36.8 C), temperature source Oral, resp. rate 18, SpO2 99%. There is no height or weight on file to calculate BMI.  Musculoskeletal: Strength &  Muscle Tone: within normal limits Gait & Station: normal Patient leans: N/A   [facility] MSE Discharge Disposition for Follow up and  Recommendations: Based on my evaluation the patient does not appear to have an emergency medical condition and can be discharged with resources and follow up care in outpatient services for substance abuse treatment programs.  Based on your evaluation today, please follow up with the list of outpatient services provided for a substance abuse evaluation and residential treatment options.   Layla Barter, NP 10/11/2023, 5:47 PM

## 2023-10-11 NOTE — Discharge Instructions (Addendum)
Based on your evaluation today, please follow up with the list of outpatient services provided for a substance abuse evaluation and residential treatment options.

## 2023-10-11 NOTE — Progress Notes (Signed)
   10/11/23 1637  [facility] Triage Screening (Walk-ins at [facility] only)  How Did You Hear About [place]? Family/Friend  What Is the Reason for Your Visit/Call Today? David [name] presents to [facility] voluntarily accompanied by his wife. Pt states he has a court order to do 7 days inpatient in a drug & alcohol rehab. Pt currently denies SI, HI, AVH and alcohol/drug use.  How Long Has This Been Causing You Problems? > than 6 months  Have You Recently Had Any Thoughts About Hurting Yourself? No  Are You Planning to Commit Suicide/Harm Yourself At This time? No  Have you Recently Had Thoughts About Hurting Someone [name]? No  Are You Planning To Harm Someone At This Time? No  Physical Abuse Denies  Verbal Abuse Denies  Sexual Abuse Denies  Exploitation of patient/patient's resources Yes, past (Comment);Yes, present (Comment)  Self-Neglect Denies  Are you currently experiencing any auditory, visual or other hallucinations? No  Have You Used Any Alcohol or Drugs in the Past 24 Hours? No  Do you have any current medical co-morbidities that require immediate attention? No  Clinician description of patient physical appearance/behavior: neatly dressed, anxious, cooperative  What Do You Feel Would Help You the Most Today? Social Support;Alcohol or Drug Use Treatment  If access to [facility] Urgent Care was not available, would you have sought care in the Emergency Department? No  Determination of Need Routine (7 days)  Options For Referral Chemical Dependency Intensive Outpatient Therapy (CDIOP);Inpatient Hospitalization;Medication Management;Facility-Based Crisis;Outpatient Therapy;Partial Hospitalization

## 2023-11-02 ENCOUNTER — Encounter: Payer: Self-pay | Admitting: Student

## 2023-11-02 ENCOUNTER — Other Ambulatory Visit: Payer: Self-pay | Admitting: *Deleted

## 2023-11-02 ENCOUNTER — Telehealth: Payer: Self-pay | Admitting: *Deleted

## 2023-11-02 ENCOUNTER — Ambulatory Visit (HOSPITAL_COMMUNITY)
Admission: RE | Admit: 2023-11-02 | Discharge: 2023-11-02 | Disposition: A | Discharge status: Home / Self Care | Payer: Medicare HMO | Source: Ambulatory Visit | Attending: Internal Medicine | Admitting: Internal Medicine

## 2023-11-02 ENCOUNTER — Ambulatory Visit (INDEPENDENT_AMBULATORY_CARE_PROVIDER_SITE_OTHER): Payer: Medicare HMO | Admitting: Student

## 2023-11-02 VITALS — BP 105/58 | HR 92 | Temp 97.5°F | Ht 68.0 in | Wt 191.5 lb

## 2023-11-02 DIAGNOSIS — R059 Cough, unspecified: Secondary | ICD-10-CM | POA: Diagnosis present

## 2023-11-02 DIAGNOSIS — E785 Hyperlipidemia, unspecified: Secondary | ICD-10-CM

## 2023-11-02 DIAGNOSIS — G40909 Epilepsy, unspecified, not intractable, without status epilepticus: Secondary | ICD-10-CM | POA: Diagnosis not present

## 2023-11-02 DIAGNOSIS — F17200 Nicotine dependence, unspecified, uncomplicated: Secondary | ICD-10-CM | POA: Diagnosis not present

## 2023-11-02 NOTE — Assessment & Plan Note (Signed)
Patient set a quite date for 10/30/2022. He reports he has not had anything to smoke since the quite date. He also confirmed he received his nicotine patches today. - Follow up on smoking cessation in a month

## 2023-11-02 NOTE — Progress Notes (Deleted)
CC: ***  HPI:  DavidDavid [name] is a 68 y.o. male living with a history stated below and presents today for ***. Please see problem based assessment and plan for additional details.  Past Medical History:  Diagnosis Date   ADD (attention deficit disorder)    Anemia    Microcytic   Arthritis    Bipolar disorder (HCC)    Depression    Dyspnea    history of no current issues 05/26/2019   GAD (generalized anxiety disorder)    History of kidney stones    passed   HTN (hypertension)    PTSD (post-traumatic stress disorder)    Skin cancer     Current Outpatient Medications on File Prior to Visit  Medication Sig Dispense Refill   amLODipine-olmesartan (AZOR) 5-20 MG tablet Take 1 tablet by mouth daily. 90 tablet 3   aspirin EC 81 MG tablet Take 1 tablet (81 mg total) by mouth daily. Swallow whole. 360 tablet 0   atorvastatin (LIPITOR) 40 MG tablet Take 1 tablet (40 mg total) by mouth daily. 30 tablet 11   ferrous sulfate 325 (65 FE) MG EC tablet Take 1 tablet (325 mg total) by mouth daily with breakfast. 30 tablet 0   FLUoxetine (PROZAC) 20 MG capsule Take 2 capsules (40 mg total) by mouth daily. 180 capsule 1   hydrOXYzine (ATARAX) 25 MG tablet TAKE 1 TABLET BY MOUTH EVERY 6 HOURS AS NEEDED FOR ITCHING OR ANXIETY 120 tablet 3   lamoTRIgine (LAMICTAL) 100 MG tablet Take 1 tablet (100 mg total) by mouth 2 (two) times daily. Appointment needed for further refills 60 tablet 11   modafinil (PROVIGIL) 200 MG tablet Take 1 tablet (200 mg total) by mouth daily. 30 tablet 1   nicotine (NICODERM CQ) 21 mg/24hr patch Place 1 patch (21 mg total) onto the skin daily. 28 patch 0   ondansetron (ZOFRAN) 4 MG tablet Take 1 tablet (4 mg total) by mouth every 8 (eight) hours as needed for nausea or vomiting. 21 tablet 0   pantoprazole (PROTONIX) 40 MG tablet TAKE 1 TABLET(40 MG) BY MOUTH DAILY 30 tablet 2   pregabalin (LYRICA) 100 MG capsule Take 1 capsule (100 mg total) by mouth 2 (two) times daily.  60 capsule 3   pregabalin (LYRICA) 150 MG capsule TAKE 1 CAPSULE(150 MG) BY MOUTH TWICE DAILY 60 capsule 2   propranolol (INDERAL) 10 MG tablet TAKE 1 TO 4 TABLETS BY MOUTH TWICE DAILY AS NEEDED FOR TREMORS 120 tablet 0   QUEtiapine (SEROQUEL) 200 MG tablet TAKE 3 TABLETS(600 MG) BY MOUTH AT BEDTIME 270 tablet 0   RINVOQ 15 MG TB24 Take 15 mg by mouth daily.     zolpidem (AMBIEN) 10 MG tablet TAKE 1 TO 1 AND 1/2 TABLETS BY MOUTH AT NIGHT AS NEEDED FOR SLEEP 45 tablet 2   No current facility-administered medications on file prior to visit.    Family History  Problem Relation Age of Onset   Arthritis Other    Heart disease Other    Cancer Other    Hypertension Other     Social History   Socioeconomic History   Marital status: Married    Spouse name: Not on file   Number of children: Not on file   Years of education: Not on file   Highest education level: Not on file  Occupational History   Not on file  Tobacco Use   Smoking status: Every Day    Current packs/day: 0.00  Average packs/day: 1 pack/day for 40.0 years (40.0 ttl pk-yrs)    Types: Cigarettes    Start date: 04/25/1979    Last attempt to quit: 04/25/2019    Years since quitting: 4.5   Smokeless tobacco: Never  Vaping Use   Vaping status: Never Used  Substance and Sexual Activity   Alcohol use: Yes    Comment: daily-6-7/day   Drug use: Not Currently    Types: Marijuana   Sexual activity: Not on file  Other Topics Concern   Not on file  Social History Narrative   Not on file   Social Drivers of Health   Financial Resource Strain: Medium Risk (06/23/2018)   Overall Financial Resource Strain (CARDIA)    Difficulty of Paying Living Expenses: Somewhat hard  Food Insecurity: No Food Insecurity (06/02/2023)   Hunger Vital Sign    Worried About Running Out of Food in the Last Year: Never true    Ran Out of Food in the Last Year: Never true  Transportation Needs: Unmet Transportation Needs (06/02/2023)   PRAPARE -  Transportation    Lack of Transportation (Medical): Yes    Lack of Transportation (Non-Medical): Yes  Physical Activity: Insufficiently Active (06/23/2018)   Exercise Vital Sign    Days of Exercise per Week: 4 days    Minutes of Exercise per Session: 30 min  Stress: Unknown (06/23/2018)   [employer] of Occupational Health - Occupational Stress Questionnaire    Feeling of Stress : Patient declined  Social Connections: Unknown (01/18/2022)   Received from [facility]   Social Network    Social Network: Not on file  Intimate Partner Violence: Not At Risk (06/02/2023)   Humiliation, Afraid, Rape, and Kick questionnaire    Fear of Current or Ex-Partner: No    Emotionally Abused: No    Physically Abused: No    Sexually Abused: No    Review of Systems: ROS negative except for what is noted on the assessment and plan.  There were no vitals filed for this visit.  Physical Exam: Constitutional: well-appearing *** sitting in ***, in no acute distress HENT: normocephalic atraumatic, mucous membranes moist Eyes: conjunctiva non-erythematous Cardiovascular: regular rate and rhythm, no m/r/g Pulmonary/Chest: normal work of breathing on room air, lungs clear to auscultation bilaterally Abdominal: soft, non-tender, non-distended MSK: normal bulk and tone Neurological: alert & oriented x 3, no focal deficit Skin: warm and dry Psych: normal mood and behavior  Assessment & Plan:   Weak/tired and cough x 3 days  Patient {GC/GE:[id number]::"discussed with","seen with"} Dr. {WUJWJ:[id number]::"NFAOZHYQ","M. Hoffman","Mullen","Narendra","Vincent","Guilloud","Lau","Machen"}  No problem-specific Assessment & Plan notes found for this encounter.   David [name], D.O. [facility] Internal Medicine, PGY-3 Phone: [phone] Date 11/02/2023 Time 8:43 AM

## 2023-11-02 NOTE — Assessment & Plan Note (Signed)
Patient presented to the office today due to concerns for cough for the past 3 days.  Patient reports the cough is dry and his  chest hurts when he coughs.  Patient is reporting intermittent fevers and chills and shortness of breath during this time.  He says he has been very lethargic as well.  He ever denies any muscle aches or any urinary symptoms.  Differentials include pneumonia versus flu/COVID.  To further assess the etiology of his cough, will get a CBC ,chest x-ray and a respiratory panel to assess for COVID and the flu.  -CBC -Chest x-ray -Respiratory panel

## 2023-11-02 NOTE — Assessment & Plan Note (Signed)
Stable on Lamotrigine.

## 2023-11-02 NOTE — Patient Instructions (Addendum)
Thank you, [name] for allowing [place] to provide your care today. Today we discussed your cough that has been going on for the past 3 days.  You reported that you have intermittent fevers and chills.I am getting a respiratory panel and also get a CXR to further assess what might be causing your cough.  I have ordered the following labs for you:  Lab Orders         Coronavirus (COVID-19) with Influenza A and Influenza B         Lipid Profile         CBC no Diff       Tests ordered today:    Referrals ordered today:   Referral Orders  No referral(s) requested today     I have ordered the following medication/changed the following medications:   Stop the following medications: Medications Discontinued During This Encounter  Medication Reason   pantoprazole (PROTONIX) 40 MG tablet Change in therapy     Start the following medications: No orders of the defined types were placed in this encounter.    Follow up:    Remember:   Should you have any questions or concerns please call the internal medicine clinic at [phone].    Kathleen Lime, M.D [facility]

## 2023-11-02 NOTE — Assessment & Plan Note (Signed)
History of hyperlipidemia currently on Lipitor 40 mg.  Lipid panel 4 months ago showed an LDL of 107.  Will recheck lipid panel today and adjust medications if needed. - Continue Lipitor 40 mg daily - Lipid panel

## 2023-11-02 NOTE — Progress Notes (Signed)
CC: Cough   HPI:  David [name] is a 68 y.o. male living with a history stated below and presents today due to concerns of cough for three days.The cough is dry with no sputum. He reports intermittent fever and chills.Pt reports he hasn't been around any sick contacts. Please see problem based assessment and plan for additional details.  Past Medical History:  Diagnosis Date   ADD (attention deficit disorder)    Anemia    Microcytic   Arthritis    Bipolar disorder (HCC)    Depression    Dyspnea    history of no current issues 05/26/2019   GAD (generalized anxiety disorder)    History of kidney stones    passed   HTN (hypertension)    PTSD (post-traumatic stress disorder)    Skin cancer     Current Outpatient Medications on File Prior to Visit  Medication Sig Dispense Refill   amLODipine-olmesartan (AZOR) 5-20 MG tablet Take 1 tablet by mouth daily. 90 tablet 3   aspirin EC 81 MG tablet Take 1 tablet (81 mg total) by mouth daily. Swallow whole. 360 tablet 0   atorvastatin (LIPITOR) 40 MG tablet Take 1 tablet (40 mg total) by mouth daily. 30 tablet 11   ferrous sulfate 325 (65 FE) MG EC tablet Take 1 tablet (325 mg total) by mouth daily with breakfast. 30 tablet 0   FLUoxetine (PROZAC) 20 MG capsule Take 2 capsules (40 mg total) by mouth daily. 180 capsule 1   hydrOXYzine (ATARAX) 25 MG tablet TAKE 1 TABLET BY MOUTH EVERY 6 HOURS AS NEEDED FOR ITCHING OR ANXIETY 120 tablet 3   lamoTRIgine (LAMICTAL) 100 MG tablet Take 1 tablet (100 mg total) by mouth 2 (two) times daily. Appointment needed for further refills 60 tablet 11   modafinil (PROVIGIL) 200 MG tablet Take 1 tablet (200 mg total) by mouth daily. 30 tablet 1   nicotine (NICODERM CQ) 21 mg/24hr patch Place 1 patch (21 mg total) onto the skin daily. 28 patch 0   ondansetron (ZOFRAN) 4 MG tablet Take 1 tablet (4 mg total) by mouth every 8 (eight) hours as needed for nausea or vomiting. 21 tablet 0   pregabalin (LYRICA) 100  MG capsule Take 1 capsule (100 mg total) by mouth 2 (two) times daily. 60 capsule 3   pregabalin (LYRICA) 150 MG capsule TAKE 1 CAPSULE(150 MG) BY MOUTH TWICE DAILY 60 capsule 2   propranolol (INDERAL) 10 MG tablet TAKE 1 TO 4 TABLETS BY MOUTH TWICE DAILY AS NEEDED FOR TREMORS 120 tablet 0   QUEtiapine (SEROQUEL) 200 MG tablet TAKE 3 TABLETS(600 MG) BY MOUTH AT BEDTIME 270 tablet 0   RINVOQ 15 MG TB24 Take 15 mg by mouth daily.     zolpidem (AMBIEN) 10 MG tablet TAKE 1 TO 1 AND 1/2 TABLETS BY MOUTH AT NIGHT AS NEEDED FOR SLEEP 45 tablet 2   No current facility-administered medications on file prior to visit.    Family History  Problem Relation Age of Onset   Arthritis Other    Heart disease Other    Cancer Other    Hypertension Other     Social History   Socioeconomic History   Marital status: Married    Spouse name: Not on file   Number of children: Not on file   Years of education: Not on file   Highest education level: Not on file  Occupational History   Not on file  Tobacco Use  Smoking status: Every Day    Current packs/day: 0.00    Average packs/day: 1 pack/day for 40.0 years (40.0 ttl pk-yrs)    Types: Cigarettes    Start date: 04/25/1979    Last attempt to quit: 04/25/2019    Years since quitting: 4.5   Smokeless tobacco: Never  Vaping Use   Vaping status: Never Used  Substance and Sexual Activity   Alcohol use: Yes    Comment: daily-6-7/day   Drug use: Not Currently    Types: Marijuana   Sexual activity: Not on file  Other Topics Concern   Not on file  Social History Narrative   Not on file   Social Drivers of Health   Financial Resource Strain: Medium Risk (06/23/2018)   Overall Financial Resource Strain (CARDIA)    Difficulty of Paying Living Expenses: Somewhat hard  Food Insecurity: No Food Insecurity (06/02/2023)   Hunger Vital Sign    Worried About Running Out of Food in the Last Year: Never true    Ran Out of Food in the Last Year: Never true   Transportation Needs: Unmet Transportation Needs (06/02/2023)   PRAPARE - Transportation    Lack of Transportation (Medical): Yes    Lack of Transportation (Non-Medical): Yes  Physical Activity: Insufficiently Active (06/23/2018)   Exercise Vital Sign    Days of Exercise per Week: 4 days    Minutes of Exercise per Session: 30 min  Stress: Unknown (06/23/2018)   [employer] of Occupational Health - Occupational Stress Questionnaire    Feeling of Stress : Patient declined  Social Connections: Unknown (01/18/2022)   Received from [facility]   Social Network    Social Network: Not on file  Intimate Partner Violence: Not At Risk (06/02/2023)   Humiliation, Afraid, Rape, and Kick questionnaire    Fear of Current or Ex-Partner: No    Emotionally Abused: No    Physically Abused: No    Sexually Abused: No    Review of Systems: ROS negative except for what is noted on the assessment and plan.  Vitals:   11/02/23 1100  BP: (!) 105/58  Pulse: 92  Temp: (!) 97.5 F (36.4 C)  TempSrc: Oral  SpO2: 97%  Weight: 191 lb 8 oz (86.9 kg)  Height: 5\' 8"  (1.727 m)    Physical Exam: Constitutional: well-appearing man, sitting in chair, intermittent cough Cardiovascular: regular rate and rhythm, no m/r/g Pulmonary/Chest: Diffuse wheezing appreciated, in mild acute distress, no use of accessory muscles MSK: normal bulk and tone Neurological: alert & oriented x 3, no focal deficit Skin: warm and dry Psych: normal mood and behavior  Assessment & Plan:   Seizure disorder (HCC) Stable on Lamotrigine.   Tobacco use disorder Patient set a quite date for 10/30/2022. He reports he has not had anything to smoke since the quite date. He also confirmed he received his nicotine patches today. - Follow up on smoking cessation in a month  Cough in adult Patient presented to the office today due to concerns for cough for the past 3 days.  Patient reports the cough is dry and his   chest hurts when he coughs.  Patient is reporting intermittent fevers and chills and shortness of breath during this time.  He says he has been very lethargic as well.  He ever denies any muscle aches or any urinary symptoms.  Differentials include pneumonia versus flu/COVID.  To further assess the etiology of his cough, will get a CBC ,chest x-ray and  a respiratory panel to assess for COVID and the flu.  -CBC -Chest x-ray -Respiratory panel  Hyperlipidemia History of hyperlipidemia currently on Lipitor 40 mg.  Lipid panel 4 months ago showed an LDL of 107.  Will recheck lipid panel today and adjust medications if needed. - Continue Lipitor 40 mg daily - Lipid panel   Patient discussed with Dr. Cleda Daub   Kathleen Lime, M.D [facility] Phone: [phone] Date 11/02/2023 Time 11:50 AM

## 2023-11-02 NOTE — Telephone Encounter (Signed)
Call from pt's wife requesting an appt for the pt. Stated pt is very weak, has non-productive cough, unsure if pt has a fever x 3 days and getting worse. Stated she's afraid he may have pneumonia. Pt is a smoker. Appt given w/Dr Mickie Bail this am @ 1045 AM.

## 2023-11-03 ENCOUNTER — Telehealth: Payer: Self-pay | Admitting: *Deleted

## 2023-11-03 LAB — LIPID PANEL
Chol/HDL Ratio: 2.1 {ratio} (ref 0.0–5.0)
Cholesterol, Total: 170 mg/dL (ref 100–199)
HDL: 81 mg/dL (ref >39)
LDL Chol Calc (NIH): 75 mg/dL (ref 0–99)
Triglycerides: 73 mg/dL (ref 0–149)
VLDL Cholesterol Cal: 14 mg/dL (ref 5–40)

## 2023-11-03 LAB — CBC
Hematocrit: 32.5 % — ABNORMAL LOW (ref 37.5–51.0)
Hemoglobin: 10.3 g/dL — ABNORMAL LOW (ref 13.0–17.7)
MCH: 26.9 pg (ref 26.6–33.0)
MCHC: 31.7 g/dL (ref 31.5–35.7)
MCV: 85 fL (ref 79–97)
Platelets: 281 10*3/uL (ref 150–450)
RBC: 3.83 x10E6/uL — ABNORMAL LOW (ref 4.14–5.80)
RDW: 16.3 % — ABNORMAL HIGH (ref 11.6–15.4)
WBC: 10.4 10*3/uL (ref 3.4–10.8)

## 2023-11-03 NOTE — Telephone Encounter (Signed)
Patient's wife is calling asking about results from the test her husband had on yesterday.

## 2023-11-04 LAB — COVID-19, FLU A+B NAA
Influenza A, NAA: NOT DETECTED
Influenza B, NAA: NOT DETECTED
SARS-CoV-2, NAA: NOT DETECTED

## 2023-11-05 ENCOUNTER — Encounter: Payer: Self-pay | Admitting: Student

## 2023-11-05 ENCOUNTER — Ambulatory Visit: Payer: Medicare HMO | Admitting: Student

## 2023-11-05 VITALS — BP 114/79 | HR 95 | Temp 97.7°F | Ht 68.0 in | Wt 187.4 lb

## 2023-11-05 DIAGNOSIS — R059 Cough, unspecified: Secondary | ICD-10-CM | POA: Diagnosis not present

## 2023-11-05 DIAGNOSIS — D508 Other iron deficiency anemias: Secondary | ICD-10-CM | POA: Diagnosis not present

## 2023-11-05 DIAGNOSIS — Z7689 Persons encountering health services in other specified circumstances: Secondary | ICD-10-CM

## 2023-11-05 NOTE — Telephone Encounter (Signed)
Pt is currently here for an appt with Dr Mickie Bail.

## 2023-11-05 NOTE — Assessment & Plan Note (Signed)
Presented to me 3 days ago due to concerns for intermittent dry cough and chills.  Follow-up CBC and chest x-ray was unrevealing.  In the setting of his chronic cough and lethargy with a history of smoking, I worry and wonder if this patient has a component of COPD that has been undiagnosed.  He has never had a PFT done in the past.  Will order a PFT and a CT chest to further evaluate for COPD versus pulmonary malignancy.  Will also get an alpha -1-antitrypsin deficiency test done today -Pulmonary function test -CT chest wo contrast

## 2023-11-05 NOTE — Patient Instructions (Addendum)
Thank you, Mr.Dino E Kaps for allowing [place] to provide your care today. Today we discussed your general health and your lethargy and cough concerns .  All you blood work and chest xray didn't show any acute issues when I saw you last time. I am sending you to get a test called spirometry done .I am also repeating your iron studies to see if you are on appropriate dose of iron . I will also get a gene called alpha 1 antitrypsin to see its its plan to rule your chronic cough and lethargy.  I have ordered the following labs for you:  Lab Orders         Alpha-1-Antitrypsin Deficiency         Iron, TIBC and Ferritin Panel         Vitamin B12       Tests ordered today:    Referrals ordered today:   Referral Orders  No referral(s) requested today     I have ordered the following medication/changed the following medications:   Stop the following medications: There are no discontinued medications.   Start the following medications: No orders of the defined types were placed in this encounter.    Follow up: As needed    Remember:   Should you have any questions or concerns please call the internal medicine clinic at [phone].    Kathleen Lime, M.D [facility]

## 2023-11-05 NOTE — Assessment & Plan Note (Signed)
Patient continues to be lethargic despite being ion iron pills since september 2024. I wonder if he is on an appropriate dose or even responding to the medication. CBC few days ago reassuring and no concerns for acute blood lost. Will repeat iron studies to further assess.Reports he used to take B12 injections in the past.  Check vitamin B12 levels day.  If patient iron level remains low with or not responding to the current dose, consider IV iron infusions. -Iron studies -Check vitamin B12 level

## 2023-11-05 NOTE — Progress Notes (Signed)
CC: Follow up   HPI:  [name] is a 68 y.o. male living with a history stated below and presents today due to concerns for cough and lethargy. I saw patient 3 days ago and a follow-up CBC and chest x-ray did not reveal any acute infectious processes during that time. Patient reports he still coughing and continues to have low energy which is unusual for him. Please see problem based assessment and plan for additional details.  Past Medical History:  Diagnosis Date   ADD (attention deficit disorder)    Anemia    Microcytic   Arthritis    Bipolar disorder (HCC)    Depression    Dyspnea    history of no current issues 05/26/2019   GAD (generalized anxiety disorder)    History of kidney stones    passed   HTN (hypertension)    PTSD (post-traumatic stress disorder)    Skin cancer     Current Outpatient Medications on File Prior to Visit  Medication Sig Dispense Refill   amLODipine-olmesartan (AZOR) 5-20 MG tablet Take 1 tablet by mouth daily. 90 tablet 3   aspirin EC 81 MG tablet Take 1 tablet (81 mg total) by mouth daily. Swallow whole. 360 tablet 0   atorvastatin (LIPITOR) 40 MG tablet Take 1 tablet (40 mg total) by mouth daily. 30 tablet 11   ferrous sulfate 325 (65 FE) MG EC tablet Take 1 tablet (325 mg total) by mouth daily with breakfast. 30 tablet 0   FLUoxetine (PROZAC) 20 MG capsule Take 2 capsules (40 mg total) by mouth daily. 180 capsule 1   hydrOXYzine (ATARAX) 25 MG tablet TAKE 1 TABLET BY MOUTH EVERY 6 HOURS AS NEEDED FOR ITCHING OR ANXIETY 120 tablet 3   lamoTRIgine (LAMICTAL) 100 MG tablet Take 1 tablet (100 mg total) by mouth 2 (two) times daily. Appointment needed for further refills 60 tablet 11   modafinil (PROVIGIL) 200 MG tablet Take 1 tablet (200 mg total) by mouth daily. 30 tablet 1   nicotine (NICODERM CQ) 21 mg/24hr patch Place 1 patch (21 mg total) onto the skin daily. 28 patch 0   ondansetron (ZOFRAN) 4 MG tablet Take 1 tablet (4 mg total) by  mouth every 8 (eight) hours as needed for nausea or vomiting. 21 tablet 0   pregabalin (LYRICA) 100 MG capsule Take 1 capsule (100 mg total) by mouth 2 (two) times daily. 60 capsule 3   pregabalin (LYRICA) 150 MG capsule TAKE 1 CAPSULE(150 MG) BY MOUTH TWICE DAILY 60 capsule 2   propranolol (INDERAL) 10 MG tablet TAKE 1 TO 4 TABLETS BY MOUTH TWICE DAILY AS NEEDED FOR TREMORS 120 tablet 0   QUEtiapine (SEROQUEL) 200 MG tablet TAKE 3 TABLETS(600 MG) BY MOUTH AT BEDTIME 270 tablet 0   RINVOQ 15 MG TB24 Take 15 mg by mouth daily.     zolpidem (AMBIEN) 10 MG tablet TAKE 1 TO 1 AND 1/2 TABLETS BY MOUTH AT NIGHT AS NEEDED FOR SLEEP 45 tablet 2   No current facility-administered medications on file prior to visit.    Family History  Problem Relation Age of Onset   Arthritis Other    Heart disease Other    Cancer Other    Hypertension Other     Social History   Socioeconomic History   Marital status: Married    Spouse name: Not on file   Number of children: Not on file   Years of education: Not on file  Highest education level: Not on file  Occupational History   Not on file  Tobacco Use   Smoking status: Every Day    Current packs/day: 0.00    Average packs/day: 1 pack/day for 40.0 years (40.0 ttl pk-yrs)    Types: Cigarettes    Start date: 04/25/1979    Last attempt to quit: 04/25/2019    Years since quitting: 4.5   Smokeless tobacco: Never  Vaping Use   Vaping status: Never Used  Substance and Sexual Activity   Alcohol use: Yes    Comment: daily-6-7/day   Drug use: Not Currently    Types: Marijuana   Sexual activity: Not on file  Other Topics Concern   Not on file  Social History Narrative   Not on file   Social Drivers of Health   Financial Resource Strain: Medium Risk (06/23/2018)   Overall Financial Resource Strain (CARDIA)    Difficulty of Paying Living Expenses: Somewhat hard  Food Insecurity: No Food Insecurity (06/02/2023)   Hunger Vital Sign    Worried About  Running Out of Food in the Last Year: Never true    Ran Out of Food in the Last Year: Never true  Transportation Needs: Unmet Transportation Needs (06/02/2023)   PRAPARE - Transportation    Lack of Transportation (Medical): Yes    Lack of Transportation (Non-Medical): Yes  Physical Activity: Insufficiently Active (06/23/2018)   Exercise Vital Sign    Days of Exercise per Week: 4 days    Minutes of Exercise per Session: 30 min  Stress: Unknown (06/23/2018)   [employer] of Occupational Health - Occupational Stress Questionnaire    Feeling of Stress : Patient declined  Social Connections: Unknown (01/18/2022)   Received from [facility], [facility]   Social Network    Social Network: Not on file  Intimate Partner Violence: Not At Risk (06/02/2023)   Humiliation, Afraid, Rape, and Kick questionnaire    Fear of Current or Ex-Partner: No    Emotionally Abused: No    Physically Abused: No    Sexually Abused: No    Review of Systems: ROS negative except for what is noted on the assessment and plan.  Vitals:   11/05/23 1358  BP: 114/79  Pulse: 95  Temp: 97.7 F (36.5 C)  TempSrc: Oral  Weight: 187 lb 6.4 oz (85 kg)  Height: 5\' 8"  (1.727 m)    Physical Exam: Constitutional: well-appearing man, sitting in a wheel chair, in no acute distress HENT: normocephalic atraumatic, mucous membranes moist Eyes: pale conjunctiva non-erythematous Cardiovascular: regular rate and rhythm, no m/r/g Pulmonary/Chest: normal work of breathing on room air, lungs clear to auscultation bilaterally Neurological: alert & oriented x 3, no focal deficit Skin: Cold hands and feet. Psych: normal mood and behavior  Assessment & Plan:   Iron deficiency anemia Patient continues to be lethargic despite being ion iron pills since september 2024. I wonder if he is on an appropriate dose or even responding to the medication. CBC few days ago reassuring and no concerns for acute blood lost. Will repeat  iron studies to further assess.Reports he used to take B12 injections in the past.  Check vitamin B12 levels day.  If patient iron level remains low with or not responding to the current dose, consider IV iron infusions. -Iron studies -Check vitamin B12 level  Encounter for evaluation of COPD Presented to me 3 days ago due to concerns for intermittent dry cough and chills.  Follow-up CBC and chest x-ray was  unrevealing.  In the setting of his chronic cough and lethargy with a history of smoking, I worry and wonder if this patient has a component of COPD that has been undiagnosed.  He has never had a PFT done in the past.  Will order a PFT and a CT chest to further evaluate for COPD versus pulmonary malignancy.  Will also get an alpha -1-antitrypsin deficiency test done today -Pulmonary function test -CT chest wo contrast  Patient discussed with Dr. Ginger Carne, M.D [facility] Phone: [phone] Date 11/05/2023 Time 3:04 PM

## 2023-11-06 ENCOUNTER — Ambulatory Visit (HOSPITAL_COMMUNITY)
Admission: RE | Admit: 2023-11-06 | Discharge: 2023-11-06 | Disposition: A | Discharge status: Home / Self Care | Payer: Medicare HMO | Source: Ambulatory Visit | Attending: Internal Medicine | Admitting: Internal Medicine

## 2023-11-06 DIAGNOSIS — J8410 Pulmonary fibrosis, unspecified: Secondary | ICD-10-CM | POA: Diagnosis not present

## 2023-11-06 DIAGNOSIS — J439 Emphysema, unspecified: Secondary | ICD-10-CM | POA: Insufficient documentation

## 2023-11-06 DIAGNOSIS — Z7689 Persons encountering health services in other specified circumstances: Secondary | ICD-10-CM | POA: Diagnosis present

## 2023-11-06 DIAGNOSIS — I2510 Atherosclerotic heart disease of native coronary artery without angina pectoris: Secondary | ICD-10-CM | POA: Diagnosis not present

## 2023-11-06 DIAGNOSIS — I700 Atherosclerosis of aorta: Secondary | ICD-10-CM | POA: Diagnosis not present

## 2023-11-06 DIAGNOSIS — I358 Other nonrheumatic aortic valve disorders: Secondary | ICD-10-CM | POA: Diagnosis not present

## 2023-11-06 DIAGNOSIS — R053 Chronic cough: Secondary | ICD-10-CM | POA: Insufficient documentation

## 2023-11-06 DIAGNOSIS — J449 Chronic obstructive pulmonary disease, unspecified: Secondary | ICD-10-CM | POA: Diagnosis present

## 2023-11-06 MED ORDER — IOHEXOL 350 MG/ML SOLN
50.0000 mL | Freq: Once | INTRAVENOUS | Status: AC | PRN
  Administered 2023-11-06: 50 mL via INTRAVENOUS

## 2023-11-09 NOTE — Progress Notes (Signed)
 Internal Medicine Clinic Attending  Case discussed with the resident at the time of the visit.  We reviewed the resident's history and exam and pertinent patient test results.  I agree with the assessment, diagnosis, and plan of care documented in the resident's note.

## 2023-11-11 ENCOUNTER — Inpatient Hospital Stay (HOSPITAL_COMMUNITY): Admission: RE | Admit: 2023-11-11 | Payer: Medicare HMO | Source: Ambulatory Visit

## 2023-11-11 NOTE — Progress Notes (Signed)
 Internal Medicine Clinic Attending  Case discussed with the resident at the time of the visit.  We reviewed the resident's history and exam and pertinent patient test results.  I agree with the assessment, diagnosis, and plan of care documented in the resident's note.

## 2023-11-16 ENCOUNTER — Encounter: Payer: Self-pay | Admitting: Student

## 2023-11-16 LAB — ALPHA-1-ANTITRYPSIN DEFICIENCY

## 2023-11-16 LAB — IRON,TIBC AND FERRITIN PANEL
Ferritin: 141 ng/mL (ref 30–400)
Iron Saturation: 10 % — ABNORMAL LOW (ref 15–55)
Iron: 29 ug/dL — ABNORMAL LOW (ref 38–169)
Total Iron Binding Capacity: 303 ug/dL (ref 250–450)
UIBC: 274 ug/dL (ref 111–343)

## 2023-11-16 LAB — VITAMIN B12: Vitamin B-12: 302 pg/mL (ref 232–1245)

## 2023-11-18 ENCOUNTER — Encounter (HOSPITAL_COMMUNITY): Payer: Medicare HMO

## 2023-11-23 ENCOUNTER — Inpatient Hospital Stay (HOSPITAL_COMMUNITY): Admission: RE | Admit: 2023-11-23 | Source: Ambulatory Visit

## 2023-12-07 ENCOUNTER — Ambulatory Visit: Payer: Medicare HMO | Admitting: Psychiatry

## 2023-12-28 ENCOUNTER — Other Ambulatory Visit: Payer: Self-pay | Admitting: Psychiatry

## 2023-12-28 DIAGNOSIS — G8929 Other chronic pain: Secondary | ICD-10-CM

## 2023-12-28 DIAGNOSIS — F411 Generalized anxiety disorder: Secondary | ICD-10-CM

## 2023-12-29 ENCOUNTER — Ambulatory Visit (INDEPENDENT_AMBULATORY_CARE_PROVIDER_SITE_OTHER): Admitting: Psychiatry

## 2023-12-29 ENCOUNTER — Encounter: Payer: Self-pay | Admitting: Psychiatry

## 2023-12-29 ENCOUNTER — Telehealth: Payer: Self-pay

## 2023-12-29 DIAGNOSIS — I10 Essential (primary) hypertension: Secondary | ICD-10-CM

## 2023-12-29 DIAGNOSIS — G8929 Other chronic pain: Secondary | ICD-10-CM

## 2023-12-29 DIAGNOSIS — M797 Fibromyalgia: Secondary | ICD-10-CM

## 2023-12-29 DIAGNOSIS — F5105 Insomnia due to other mental disorder: Secondary | ICD-10-CM

## 2023-12-29 DIAGNOSIS — F411 Generalized anxiety disorder: Secondary | ICD-10-CM | POA: Diagnosis not present

## 2023-12-29 DIAGNOSIS — F902 Attention-deficit hyperactivity disorder, combined type: Secondary | ICD-10-CM

## 2023-12-29 DIAGNOSIS — F39 Unspecified mood [affective] disorder: Secondary | ICD-10-CM

## 2023-12-29 DIAGNOSIS — F17219 Nicotine dependence, cigarettes, with unspecified nicotine-induced disorders: Secondary | ICD-10-CM

## 2023-12-29 DIAGNOSIS — M5440 Lumbago with sciatica, unspecified side: Secondary | ICD-10-CM

## 2023-12-29 DIAGNOSIS — F308 Other manic episodes: Secondary | ICD-10-CM

## 2023-12-29 DIAGNOSIS — F1011 Alcohol abuse, in remission: Secondary | ICD-10-CM

## 2023-12-29 MED ORDER — ZOLPIDEM TARTRATE 10 MG PO TABS: ORAL_TABLET | ORAL | 2 refills | Status: DC

## 2023-12-29 MED ORDER — PREGABALIN 150 MG PO CAPS: 150.0000 mg | ORAL_CAPSULE | Freq: Two times a day (BID) | ORAL | 5 refills | Status: DC

## 2023-12-29 MED ORDER — FLUOXETINE HCL 20 MG PO CAPS: 40.0000 mg | ORAL_CAPSULE | Freq: Every day | ORAL | 1 refills | Status: AC

## 2023-12-29 MED ORDER — LAMOTRIGINE 100 MG PO TABS: 100.0000 mg | ORAL_TABLET | Freq: Two times a day (BID) | ORAL | 11 refills | Status: DC

## 2023-12-29 MED ORDER — QUETIAPINE FUMARATE 200 MG PO TABS: 400.0000 mg | ORAL_TABLET | Freq: Every day | ORAL | 1 refills | Status: DC

## 2023-12-29 NOTE — Telephone Encounter (Signed)
 Prior Authorization submitted for Zolpidem 10 mg 1.5 tablets at hs #45 with [facility], approval received effective 12/29/23-09/14/24, ZO#[id number].

## 2023-12-29 NOTE — Progress Notes (Signed)
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  Fibromyalgia  Essential hypertension  Alcohol use disorder, mild, in early remission, abuse  Chronic low back pain with sciatica, sciatica laterality unspecified, unspecified back pain laterality -     pregabalin (LYRICA) 150 MG capsule; Take 1 capsule (150 mg total) by mouth 2 (two) times daily.  Cigarette nicotine dependence with nicotine-induced disorder  Atypical manic disorder (HCC) -     QUEtiapine (SEROQUEL) 200 MG tablet; Take 2 tablets (400 mg total) by mouth at bedtime.  Other orders -     lamoTRIgine (LAMICTAL) 100 MG tablet; Take 1 tablet (100 mg total) by mouth 2 (two) times daily. Appointment needed for further refills     30 min appt.  We discussed that David [name] has been chronically disabled by severe ADD and some depression and anxiety.  Other issues below. Disc recent confusional episode and risk polypharmacy.  He feels meds are needed and helpful.  He does need the hydroxyzine for itching and anxiety.  Cont meds Prozac  for depression and anxiety.  Stopped Lyrica for chronic pain and off label for anxiety but needs it again.  Lyrica helped with his mental state but "hard to describe".   It has helped  him sleep better at times. Wants to restart it.  Had gone off when GI px were so bad.  We discussed side effects in detail including fall risk.   Disc mood effects and other FDA indications for quetiapine. Call if needed for higher dose which could help mood anxiety and sleep and stability.  Disc recent dx low FE and how supplement can help him get better.  Rinvoq helped his psoriasis markedly.  So less itching   Discussed potential metabolic side effects associated with atypical antipsychotics, as well as potential risk for movement side effects. Advised pt to contact office if movement side effects occur.  Disc risk inconsistency of meds including recurrent SZ.   Also disc risk polypharmacy given recent 6/11 and 03/19/23 and reviewed the notes from the hospital.    Reduced quetiapine 400 HS to reduce risk of repeated confusional episodes.  Emphasized don't overtake.  Continue lamotrigine 100 mg BID Continue fluoxetine 40 mg daily. Consider decrease continue hydroxyzine 25 mg every 6 hours as needed,   continue Ambien 15 mg nightly.  Needs Ambien 10 to go to sleep and then 5mg  with EMA.  Requires PA for quantity continue clonidine 0.2 mg BID for hypertension and off label anxiety and irritability If modafinil 200 doesn't help stop it.    Better back on Lyrica 150 mg BID  DC Adderall DT SZ 09/23/21 He insist that his SZ was related to drinking and other substances and not Adderall.   Disc risk of repeated SZ and restrictions on driving.  No SZ.  Disc his desire to resume Adderall bc he thinks the sz related to combining meds and alcohol but can't risk RX Adderall to him again.  Disc that he may be right, there has been no evidence of Adderall abuse and he has taken it for years without SZ.  He was drinking too  much at time of SZ and that could have been the cause for the SZ.  However , neuro rec stop stimulant so it is difficult for [place] to RX stimulant again. ADD much worse off stimulant and can't get something done.   He is struggling with productivity and concentration.  This is contributing to some depression. Disc pros/cons of modafinil.  He's still not sure about it's effect but thinks he tolerates it and wants to continue the trial.  He still would prefer the Adderall.   Counseling 20 min on mandated rehab issues.  He is in [employer] via [employer] bc no transportation.  Should be able to get license back end of May but not sure.  Will be sober a year end of April. Disc smoking cessation.  Patch vs Chantix.  No Chantix now DT nausea.  Must stop smoking if he starts the patch and wait until nausea is better.  Nicotine patch  Sober since July 2024 with neg UDS.  Does not appear to require inpatient rehab from clincal perspective.  Requirement for it legally may not be clinically necessary. Disc abstinence with drugs and alcohol  Will need to get testosterone from either PCP or urologist.  I wont RX this anymore bc I'm not comfortable doing so.  He stopped it bc at the time thought it was worsening psoriasis.    Disc risk of skipping lamotrigine or Lyrica and risk of SZ.  Fu 2 mos  Meredith Staggers, MD, DFAPA   Please see After Visit Summary for patient specific instructions.  Future Appointments  Date Time Provider Department Center  03/08/2024  1:30 PM Cottle, Kennedy Peabody., MD [facility] None        No orders of the defined types were placed in this encounter.      -------------------------------

## 2023-12-30 NOTE — Progress Notes (Signed)
Already approved

## 2024-01-21 ENCOUNTER — Ambulatory Visit: Payer: Medicare HMO | Admitting: Psychiatry

## 2024-01-21 ENCOUNTER — Telehealth: Payer: Self-pay | Admitting: Psychiatry

## 2024-01-21 MED ORDER — PROPRANOLOL HCL 10 MG PO TABS: ORAL_TABLET | ORAL | 0 refills | Status: DC

## 2024-01-21 NOTE — Telephone Encounter (Signed)
 Propranolol  RF sent to [facility].

## 2024-01-21 NOTE — Telephone Encounter (Signed)
 Pt called asking for a refill on his propranolol  10 mg. Pharmacy is [facility] on [address]

## 2024-02-17 NOTE — Telephone Encounter (Signed)
 Pt requesting to be sch for an AWV .  Please see message below.  Copied from CRM [id number]. Topic: Appointments - Appointment Scheduling >> Feb 15, 2024  4:52 PM Tiffany H wrote: Patient/patient representative is calling to schedule an appointment. Refer to attachments for appointment information.   Patient called to schedule Annual Medicare Visit. First available was in October via AD - patient would like first available sooner rather than later, please assist.

## 2024-02-22 DIAGNOSIS — L2089 Other atopic dermatitis: Secondary | ICD-10-CM | POA: Diagnosis not present

## 2024-02-22 DIAGNOSIS — L570 Actinic keratosis: Secondary | ICD-10-CM | POA: Diagnosis not present

## 2024-02-22 DIAGNOSIS — D492 Neoplasm of unspecified behavior of bone, soft tissue, and skin: Secondary | ICD-10-CM | POA: Diagnosis not present

## 2024-02-22 DIAGNOSIS — D0439 Carcinoma in situ of skin of other parts of face: Secondary | ICD-10-CM | POA: Diagnosis not present

## 2024-03-08 ENCOUNTER — Ambulatory Visit: Admitting: Psychiatry

## 2024-05-12 ENCOUNTER — Encounter: Payer: Self-pay | Admitting: Psychiatry

## 2024-05-12 ENCOUNTER — Ambulatory Visit (INDEPENDENT_AMBULATORY_CARE_PROVIDER_SITE_OTHER): Admitting: Psychiatry

## 2024-05-12 DIAGNOSIS — M797 Fibromyalgia: Secondary | ICD-10-CM | POA: Diagnosis not present

## 2024-05-12 DIAGNOSIS — F411 Generalized anxiety disorder: Secondary | ICD-10-CM | POA: Diagnosis not present

## 2024-05-12 DIAGNOSIS — F39 Unspecified mood [affective] disorder: Secondary | ICD-10-CM | POA: Diagnosis not present

## 2024-05-12 DIAGNOSIS — M5440 Lumbago with sciatica, unspecified side: Secondary | ICD-10-CM

## 2024-05-12 DIAGNOSIS — F5105 Insomnia due to other mental disorder: Secondary | ICD-10-CM | POA: Diagnosis not present

## 2024-05-12 DIAGNOSIS — G8929 Other chronic pain: Secondary | ICD-10-CM

## 2024-05-12 DIAGNOSIS — F902 Attention-deficit hyperactivity disorder, combined type: Secondary | ICD-10-CM

## 2024-05-12 MED ORDER — ATOMOXETINE HCL 25 MG PO CAPS
ORAL_CAPSULE | ORAL | 0 refills | Status: AC
Start: 2024-05-12 — End: ?

## 2024-05-12 MED ORDER — PROPRANOLOL HCL 10 MG PO TABS: ORAL_TABLET | ORAL | 0 refills | Status: AC

## 2024-05-12 NOTE — Progress Notes (Signed)
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 for this visit:  Episodic mood disorder (HCC)  Generalized anxiety disorder  Insomnia due to mental condition  Fibromyalgia  Chronic low back pain with sciatica, sciatica laterality unspecified, unspecified back pain laterality  Attention deficit hyperactivity disorder (ADHD), combined type -     atomoxetine  (STRATTERA ) 25 MG capsule; 1 capsule daily for 5 days, then 2 capsules daily for 5 days, then 3 capsules daily      30 min appt.  We discussed that David [name] has been chronically disabled by severe ADD and some depression and anxiety.  Other issues below.  More dep and subdued.  ? Related to lamotrigine .    He does need the hydroxyzine  for itching and anxiety.  Cont meds Prozac  for depression and anxiety.  Stopped Lyrica  for chronic pain and off label for anxiety but needs it again.  Lyrica  helped with his mental state but hard to describe.   It has helped him sleep better at times. Wants to restart it.  Had gone off when GI px were so bad.  We discussed side effects in detail including fall risk.   Disc mood effects and other FDA indications  for quetiapine . Call if needed for higher dose which could help mood anxiety and sleep and stability.  Disc recent dx low FE and how supplement can help him get  better.  Rinvoq  helped his psoriasis markedly.  So less itching   Discussed potential metabolic side effects associated with atypical antipsychotics, as well as potential risk for movement side effects. Advised pt to contact office if movement side effects occur.  Disc risk inconsistency of meds including recurrent SZ.   Also disc risk polypharmacy given recent 6/11 and 03/19/23 and reviewed the notes from the hospital.    Reduced quetiapine  400 HS to reduce risk of repeated confusional episodes.  Emphasized don't overtake.  Stopped modafinil  and lamotrigine  bc not needed. Continue fluoxetine  40 mg daily. Consider decrease continue hydroxyzine  25 mg every 6 hours as needed,   continue Ambien  15 mg nightly.  Needs Ambien  10 to go to sleep and then 5mg  with EMA.  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   Disc risk of repeated SZ and restrictions on driving.  No SZ.  Disc his desire to resume Adderall bc he thinks the sz related to combining meds and alcohol but can't risk RX Adderall to him again.  Disc that he may be right, there has been no evidence of Adderall abuse and he has taken it for years without SZ.  He was drinking too much at time of SZ and that could have been the cause for the SZ.  However , neuro rec stop stimulant so it is difficult for us  to RX stimulant again. ADD much worse off stimulant and can't get something done.   He is struggling with productivity and concentration.  This is contributing to some depression. Disc pros/cons of modafinil .  He's still not sure about it's effect but thinks he tolerates it and wants to continue the trial.  He still would prefer  the Adderall.   Disc abstinence with drugs and alcohol  Will need to get testosterone from either PCP or urologist.  I wont RX this anymore bc I'm not comfortable doing so.  He stopped it bc at the time thought it was worsening psoriasis.    Disc risk of skipping  Lyrica  and risk of SZ.  Meds as above: Fluoxetine  20 mg capsules 2 daily, hydroxyzine  25 every 6 hours as needed itching or anxiety,  Lyrica  100 mg twice daily, propranolol  10 to 40 mg twice daily as needed anxiety, quetiapine  400 mg nightly, Ambien  10 to 15 mg nightly as needed insomnia.  Trial Strattera  for ADD and dep; 25-75 mg daily and then increase to 100 if needed.  Consider increase Prozac  if not better.    Fu 2 mos  Lorene Macintosh, MD, DFAPA   Please see After Visit Summary for patient specific instructions.  No future appointments.       No orders of the defined types were placed in this encounter.      -------------------------------

## 2024-06-09 ENCOUNTER — Telehealth: Payer: Self-pay | Admitting: Psychiatry

## 2024-06-09 NOTE — Telephone Encounter (Signed)
 Patient called in for refill on Ambien  10mg . Ph: [phone] Appt 11/4 Pharmacy [facility] [address] [place] CHILD

## 2024-06-10 ENCOUNTER — Other Ambulatory Visit: Payer: Self-pay

## 2024-06-10 DIAGNOSIS — F5105 Insomnia due to other mental disorder: Secondary | ICD-10-CM

## 2024-06-10 MED ORDER — ZOLPIDEM TARTRATE 10 MG PO TABS: ORAL_TABLET | ORAL | 1 refills | Status: DC

## 2024-06-10 NOTE — Telephone Encounter (Signed)
 Pended

## 2024-06-21 ENCOUNTER — Other Ambulatory Visit: Payer: Self-pay | Admitting: Psychiatry

## 2024-06-21 DIAGNOSIS — F308 Other manic episodes: Secondary | ICD-10-CM

## 2024-06-21 DIAGNOSIS — F411 Generalized anxiety disorder: Secondary | ICD-10-CM

## 2024-06-21 DIAGNOSIS — F5105 Insomnia due to other mental disorder: Secondary | ICD-10-CM

## 2024-06-23 ENCOUNTER — Telehealth: Payer: Self-pay | Admitting: Student

## 2024-06-23 DIAGNOSIS — I10 Essential (primary) hypertension: Secondary | ICD-10-CM

## 2024-06-23 NOTE — Progress Notes (Signed)
 Pharmacy Quality Measure Review  This patient is appearing on a report for being at risk of failing the adherence measure for hypertension (ACEi/ARB) medications this calendar year.   Medication: Amlodipine -Olmesartan  5-20mg  Last fill date: 08/21 for 90 day supply  Attempted to call x2, left voice mail. Patient has opportunities for adherence optimization of this medication.  Angela Baalmann, PharmD [facility] Pharmacist

## 2024-07-09 ENCOUNTER — Other Ambulatory Visit: Payer: Self-pay | Admitting: Psychiatry

## 2024-07-09 DIAGNOSIS — G8929 Other chronic pain: Secondary | ICD-10-CM

## 2024-07-09 DIAGNOSIS — F411 Generalized anxiety disorder: Secondary | ICD-10-CM

## 2024-07-13 ENCOUNTER — Telehealth: Payer: Self-pay

## 2024-07-13 NOTE — Telephone Encounter (Signed)
 Patient is overdue for an appointment. LMOM for patient to call and schedule. Patient is due for HTN follow up. Mychart message sent as well.

## 2024-07-19 ENCOUNTER — Ambulatory Visit: Admitting: Psychiatry

## 2024-07-19 NOTE — Progress Notes (Signed)
 No show

## 2024-08-01 ENCOUNTER — Other Ambulatory Visit: Payer: Self-pay | Admitting: Student

## 2024-08-01 ENCOUNTER — Ambulatory Visit (INDEPENDENT_AMBULATORY_CARE_PROVIDER_SITE_OTHER): Admitting: Psychiatry

## 2024-08-01 ENCOUNTER — Encounter: Payer: Self-pay | Admitting: Psychiatry

## 2024-08-01 DIAGNOSIS — F5105 Insomnia due to other mental disorder: Secondary | ICD-10-CM

## 2024-08-01 DIAGNOSIS — F39 Unspecified mood [affective] disorder: Secondary | ICD-10-CM

## 2024-08-01 DIAGNOSIS — I10 Essential (primary) hypertension: Secondary | ICD-10-CM

## 2024-08-01 DIAGNOSIS — F411 Generalized anxiety disorder: Secondary | ICD-10-CM | POA: Diagnosis not present

## 2024-08-01 DIAGNOSIS — M5440 Lumbago with sciatica, unspecified side: Secondary | ICD-10-CM

## 2024-08-01 DIAGNOSIS — G8929 Other chronic pain: Secondary | ICD-10-CM

## 2024-08-01 DIAGNOSIS — F902 Attention-deficit hyperactivity disorder, combined type: Secondary | ICD-10-CM

## 2024-08-01 NOTE — Progress Notes (Signed)
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       Component Value Date/Time   WBC 10.4 11/02/2023 1137   WBC 5.0 06/03/2023 1010   RBC 3.83 (L) 11/02/2023 1137   RBC 3.68 (L) 06/03/2023 1010   HGB 10.3 (L) 11/02/2023 1137   HCT 32.5 (L) 11/02/2023 1137   PLT 281 11/02/2023 1137   MCV 85 11/02/2023 1137   MCH 26.9 11/02/2023 1137   MCH 26.1 06/03/2023 1010   MCHC 31.7 11/02/2023 1137   MCHC 33.0 06/03/2023 1010   RDW 16.3 (H) 11/02/2023 1137   LYMPHSABS 2.4 03/19/2023 1515   LYMPHSABS 2.9 03/17/2022 0923   MONOABS 1.1 (H) 03/19/2023 1515   EOSABS 0.0 03/19/2023 1515   EOSABS 0.1 03/17/2022 0923   BASOSABS 0.0 03/19/2023 1515   BASOSABS 0.0 03/17/2022 0923    06/03/23 lamotrigine  level 3.7 on 200 mg daily.a June and July 2024 UDS negative.  .res Assessment: Plan:    Ayuub Penley was seen today for follow-up.  Diagnoses and all orders for this visit:  Generalized anxiety disorder  Chronic low back pain with sciatica, sciatica laterality unspecified, unspecified back pain laterality  Episodic mood disorder  Insomnia due to mental condition  Attention deficit hyperactivity  disorder (ADHD), combined type  Essential hypertension       30 min appt.  We discussed that David [name] has been chronically disabled by severe ADD and some depression and anxiety.  Other issues  below..  Overall is doing better than usual at the moment with calmer life events and some postive goals.  He does need the hydroxyzine  for itching and anxiety.  Cont meds Prozac  for depression and anxiety.   We discussed side effects in detail including fall risk.   Disc mood effects and other FDA indications for quetiapine . Call if needed for higher dose which could help mood anxiety and sleep and stability.  Disc recent dx low FE and how supplement can help him get better.  Rinvoq  helped his psoriasis markedly.  So less itching   Discussed potential metabolic side effects associated with atypical antipsychotics, as well as potential risk for movement side effects. Advised pt to contact office if movement side effects occur.   Also disc risk polypharmacy given recent 6/11 and 03/19/23 and reviewed the notes from the hospital.    Reduced quetiapine  400 HS to reduce risk of repeated confusional episodes.  Emphasized don't overtake.   It is hlepful for mood, anxiety, sleep  Continue fluoxetine  40 mg daily. Consider decrease continue hydroxyzine  25 mg every 6 hours as needed,   continue Ambien  15 mg nightly.  Needs Ambien  10 to go to sleep and then 5mg  with EMA.  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   Disc risk of repeated SZ and restrictions on driving.  No SZ.  Disc his desire to resume Adderall bc he thinks the sz related to combining meds and alcohol but can't risk RX Adderall to him again.  Disc that he may be right, there has been no evidence of Adderall abuse and he has taken it for years without SZ.  He was drinking too much at  time of SZ and that could have been the cause for the SZ.  However , neuro rec stop stimulant so it is difficult for us  to RX stimulant again. ADD much worse off stimulant and can't get something done.   He is struggling with productivity and concentration.  This is contributing to some depression. Disc pros/cons of modafinil .  He's still not sure about it's effect but thinks he tolerates it and wants to continue the trial.  He still would prefer the Adderall.   Disc abstinence with drugs and alcohol  Disc risk of skipping  Lyrica  and risk of SZ.  Meds as above: Fluoxetine  20 mg capsules 2 daily, hydroxyzine  25 every 6 hours as needed itching or anxiety,  Lyrica  100 mg twice daily, propranolol  10 to 40 mg twice daily as needed anxiety, quetiapine  400 mg nightly, Ambien  10 to 15 mg nightly as needed insomnia.  Option reTrial Strattera  for ADD and dep; at 100 bc lower dose not effective.  defer   Consider increase Prozac  if not better.    Fu 2 mos  Lorene Macintosh, MD, DFAPA   Please see After Visit Summary for patient specific instructions.  Future Appointments  Date Time Provider Department Center  09/21/2024  1:00 PM Cottle, Lorene KANDICE Raddle., MD [facility] None         No orders of the defined types were placed in this encounter.      -------------------------------

## 2024-08-01 NOTE — Telephone Encounter (Signed)
 Medication sent to pharmacy

## 2024-08-08 ENCOUNTER — Other Ambulatory Visit: Payer: Self-pay | Admitting: Psychiatry

## 2024-08-08 DIAGNOSIS — F411 Generalized anxiety disorder: Secondary | ICD-10-CM

## 2024-08-08 DIAGNOSIS — F5105 Insomnia due to other mental disorder: Secondary | ICD-10-CM

## 2024-08-08 DIAGNOSIS — G8929 Other chronic pain: Secondary | ICD-10-CM

## 2024-09-21 ENCOUNTER — Encounter: Payer: Self-pay | Admitting: Psychiatry

## 2024-09-21 ENCOUNTER — Ambulatory Visit: Admitting: Psychiatry

## 2024-09-21 DIAGNOSIS — F39 Unspecified mood [affective] disorder: Secondary | ICD-10-CM

## 2024-09-21 DIAGNOSIS — F5105 Insomnia due to other mental disorder: Secondary | ICD-10-CM

## 2024-09-21 DIAGNOSIS — M5440 Lumbago with sciatica, unspecified side: Secondary | ICD-10-CM | POA: Diagnosis not present

## 2024-09-21 DIAGNOSIS — F902 Attention-deficit hyperactivity disorder, combined type: Secondary | ICD-10-CM | POA: Diagnosis not present

## 2024-09-21 DIAGNOSIS — I10 Essential (primary) hypertension: Secondary | ICD-10-CM | POA: Diagnosis not present

## 2024-09-21 DIAGNOSIS — G8929 Other chronic pain: Secondary | ICD-10-CM

## 2024-09-21 DIAGNOSIS — F411 Generalized anxiety disorder: Secondary | ICD-10-CM | POA: Diagnosis not present

## 2024-09-21 NOTE — Progress Notes (Signed)
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   CREATININE TEST NOT PERFORMED 09/03/2012 1527   CALCIUM  10.0 06/18/2023 1007   PROT 6.9 06/03/2023 1010   PROT 6.7 03/17/2022 0923   ALBUMIN 3.3 (L) 06/03/2023 1010   ALBUMIN 4.3 03/17/2022 0923   AST 15 06/03/2023 1010   ALT 12 06/03/2023 1010   ALKPHOS 91 06/03/2023 1010   BILITOT 0.6 06/03/2023 1010   BILITOT <0.2 03/17/2022 0923   GFRNONAA >60 06/04/2023 0447   GFRNONAA TEST NOT PERFORMED 09/03/2012 1527   GFRAA >60 05/27/2020 0520   GFRAA TEST NOT PERFORMED 09/03/2012 1527       Component Value Date/Time   WBC 10.4 11/02/2023 1137   WBC 5.0 06/03/2023 1010   RBC 3.83 (L) 11/02/2023 1137   RBC 3.68 (L) 06/03/2023 1010   HGB 10.3 (L) 11/02/2023 1137   HCT 32.5 (L) 11/02/2023 1137   PLT 281 11/02/2023 1137   MCV 85 11/02/2023 1137   MCH 26.9 11/02/2023 1137   MCH 26.1 06/03/2023 1010   MCHC 31.7 11/02/2023 1137   MCHC 33.0 06/03/2023 1010   RDW 16.3 (H) 11/02/2023 1137   LYMPHSABS 2.4 03/19/2023 1515   LYMPHSABS 2.9 03/17/2022 0923   MONOABS 1.1 (H) 03/19/2023 1515   EOSABS 0.0 03/19/2023 1515   EOSABS 0.1 03/17/2022 0923   BASOSABS 0.0 03/19/2023 1515   BASOSABS 0.0 03/17/2022 0923    06/03/23 lamotrigine  level 3.7 on 200 mg  daily.a June and July 2024 UDS negative.  .res Assessment: Plan:    Navin Dogan was seen today for follow-up, depression, anxiety, add and sleeping problem.  Diagnoses and all orders for this visit:  Episodic mood disorder  Attention deficit hyperactivity disorder (ADHD), combined type  Generalized anxiety disorder  Chronic low back pain with sciatica, sciatica laterality unspecified, unspecified back pain laterality  Insomnia due to mental condition  Essential hypertension        30 min appt.  We discussed that David [name] has been chronically disabled by severe ADD and some depression and anxiety.  Other issues below..  Overall is doing better than usual at the moment with calmer life events and some postive goals.  He does need the hydroxyzine  for itching and anxiety.  Cont meds Prozac  for depression and anxiety.   We discussed side effects in detail including fall risk.   Disc mood effects and other FDA indications for quetiapine . Call if needed for higher dose which could help mood anxiety and sleep and stability.  Disc recent dx low FE and how supplement can help him get better.  Rinvoq  helped his psoriasis markedly.  So less itching   Discussed potential metabolic side effects associated with atypical antipsychotics, as well as potential risk for movement side effects. Advised pt to contact office if movement side effects occur.   Also disc risk polypharmacy given recent 6/11 and 03/19/23 and reviewed the notes from the hospital.    Reduced quetiapine  400 HS to reduce risk of repeated confusional episodes.  Emphasized don't overtake.   It is hlepful for mood, anxiety, sleep  Continue fluoxetine  40 mg daily. Consider decrease continue hydroxyzine  25 mg every 6 hours as needed,   continue Ambien  15 mg nightly.  Needs Ambien  10 to go to sleep and then 5mg  with EMA.  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   Disc risk of repeated SZ and restrictions on driving.  No SZ.  Disc his desire to resume Adderall bc he thinks the sz related to combining meds and alcohol but can't risk RX Adderall to him again.  Disc that he may be right, there has been no evidence of Adderall abuse and he has taken it for years without SZ.  He was drinking too much at time of SZ and that could have been the cause for the SZ.  However , neuro rec stop stimulant so it is difficult for us  to RX stimulant again. ADD much worse off stimulant and can't get something done.   He is struggling with productivity and concentration.  This is contributing to some depression. Disc pros/cons of modafinil .  He's still not sure about it's effect but thinks he tolerates it and wants to continue the trial.  He still would prefer the Adderall.   Disc abstinence with drugs and alcohol  Disc risk of skipping  Lyrica  and risk of SZ.  Meds as above: Fluoxetine  20 mg capsules 2 daily, hydroxyzine  25 every 6 hours as needed itching or anxiety,  Lyrica  100 mg twice daily, propranolol  10 to 40 mg twice daily as needed anxiety, quetiapine  400 mg nightly, Ambien  10 to 15 mg nightly as needed insomnia.  Option reTrial Strattera  for ADD and dep; at 100 bc lower dose not effective.  defer   Consider  increase Prozac  if not better.    Fu 2 mos  Lorene Macintosh, MD, DFAPA   Please see After Visit Summary for patient specific instructions.  Future Appointments  Date Time Provider Department Center  12/07/2024  3:30 PM Cottle, Lorene KANDICE Raddle., MD [facility] None         No orders of the defined types were placed in this encounter.      -------------------------------

## 2024-12-07 ENCOUNTER — Ambulatory Visit: Admitting: Psychiatry

## 2024-12-19 ENCOUNTER — Ambulatory Visit: Payer: Self-pay | Admitting: Student

## 2025-01-19 ENCOUNTER — Ambulatory Visit: Admitting: Psychiatry
